# Patient Record
Sex: Female | Born: 1935 | Race: Black or African American
Health system: Southern US, Community
[De-identification: ages and names within clinical notes are randomized; demographics above are authoritative.]

## PROBLEM LIST (undated history)

## (undated) ENCOUNTER — Emergency Department

## (undated) DIAGNOSIS — T783XXA Angioneurotic edema, initial encounter: Secondary | ICD-10-CM

## (undated) DIAGNOSIS — I10 Essential (primary) hypertension: Secondary | ICD-10-CM

## (undated) DIAGNOSIS — E119 Type 2 diabetes mellitus without complications: Secondary | ICD-10-CM

## (undated) DIAGNOSIS — I4891 Unspecified atrial fibrillation: Secondary | ICD-10-CM

## (undated) DIAGNOSIS — K828 Other specified diseases of gallbladder: Secondary | ICD-10-CM

## (undated) DIAGNOSIS — L259 Unspecified contact dermatitis, unspecified cause: Secondary | ICD-10-CM

## (undated) DIAGNOSIS — R0609 Other forms of dyspnea: Secondary | ICD-10-CM

## (undated) DIAGNOSIS — R569 Unspecified convulsions: Secondary | ICD-10-CM

## (undated) DIAGNOSIS — K219 Gastro-esophageal reflux disease without esophagitis: Secondary | ICD-10-CM

## (undated) DIAGNOSIS — D759 Disease of blood and blood-forming organs, unspecified: Secondary | ICD-10-CM

## (undated) DIAGNOSIS — R300 Dysuria: Secondary | ICD-10-CM

## (undated) DIAGNOSIS — E669 Obesity, unspecified: Secondary | ICD-10-CM

## (undated) DIAGNOSIS — R932 Abnormal findings on diagnostic imaging of liver and biliary tract: Secondary | ICD-10-CM

## (undated) DIAGNOSIS — K648 Other hemorrhoids: Secondary | ICD-10-CM

## (undated) DIAGNOSIS — R609 Edema, unspecified: Secondary | ICD-10-CM

## (undated) DIAGNOSIS — Z8679 Personal history of other diseases of the circulatory system: Secondary | ICD-10-CM

## (undated) DIAGNOSIS — Z8719 Personal history of other diseases of the digestive system: Secondary | ICD-10-CM

## (undated) DIAGNOSIS — M25569 Pain in unspecified knee: Secondary | ICD-10-CM

## (undated) DIAGNOSIS — I639 Cerebral infarction, unspecified: Secondary | ICD-10-CM

## (undated) DIAGNOSIS — D509 Iron deficiency anemia, unspecified: Secondary | ICD-10-CM

## (undated) DIAGNOSIS — R0989 Other specified symptoms and signs involving the circulatory and respiratory systems: Secondary | ICD-10-CM

## (undated) DIAGNOSIS — G4710 Hypersomnia, unspecified: Secondary | ICD-10-CM

## (undated) HISTORY — PX: HYSTERECTOMY: SHX81

## (undated) HISTORY — PX: HERNIA REPAIR: SHX51

## (undated) HISTORY — DX: Gastro-esophageal reflux disease without esophagitis: K21.9

## (undated) HISTORY — DX: Other forms of dyspnea: R06.09

## (undated) HISTORY — DX: Abnormal findings on diagnostic imaging of liver and biliary tract: R93.2

## (undated) HISTORY — DX: Obesity, unspecified: E66.9

## (undated) HISTORY — DX: Edema, unspecified: R60.9

## (undated) HISTORY — DX: Other specified diseases of gallbladder: K82.8

## (undated) HISTORY — DX: Other hemorrhoids: K64.8

## (undated) HISTORY — DX: Other specified symptoms and signs involving the circulatory and respiratory systems: R09.89

## (undated) HISTORY — DX: Cerebral infarction, unspecified: I63.9

## (undated) HISTORY — DX: Iron deficiency anemia, unspecified: D50.9

## (undated) HISTORY — DX: Unspecified contact dermatitis, unspecified cause: L25.9

## (undated) HISTORY — DX: Angioneurotic edema, initial encounter: T78.3XXA

## (undated) HISTORY — DX: Disease of blood and blood-forming organs, unspecified: D75.9

## (undated) HISTORY — DX: Personal history of other diseases of the digestive system: Z87.19

## (undated) HISTORY — DX: Pain in unspecified knee: M25.569

## (undated) HISTORY — PX: SMALL INTESTINE SURGERY: SHX150

## (undated) HISTORY — DX: Personal history of other diseases of the circulatory system: Z86.79

## (undated) HISTORY — DX: Dysuria: R30.0

## (undated) HISTORY — DX: Hypersomnia, unspecified: G47.10

## (undated) HISTORY — PX: OTHER SURGICAL HISTORY: SHX169

## (undated) HISTORY — DX: Type 2 diabetes mellitus without complications: E11.9

## (undated) HISTORY — DX: Essential (primary) hypertension: I10

---
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
## 1997-05-02 ENCOUNTER — Encounter: Admission: RE | Admit: 1997-05-02 | Discharge: 1997-05-02 | Payer: Self-pay | Admitting: Internal Medicine

## 1997-05-19 ENCOUNTER — Encounter: Admission: RE | Admit: 1997-05-19 | Discharge: 1997-05-19 | Payer: Self-pay | Admitting: Internal Medicine

## 1997-06-08 ENCOUNTER — Encounter: Admission: RE | Admit: 1997-06-08 | Discharge: 1997-06-08 | Payer: Self-pay | Admitting: Internal Medicine

## 1997-06-20 ENCOUNTER — Encounter: Admission: RE | Admit: 1997-06-20 | Discharge: 1997-06-20 | Payer: Self-pay | Admitting: Internal Medicine

## 1997-07-11 ENCOUNTER — Encounter: Admission: RE | Admit: 1997-07-11 | Discharge: 1997-07-11 | Payer: Self-pay | Admitting: Internal Medicine

## 1997-08-01 ENCOUNTER — Encounter: Admission: RE | Admit: 1997-08-01 | Discharge: 1997-08-01 | Payer: Self-pay | Admitting: Internal Medicine

## 1997-08-23 ENCOUNTER — Encounter: Admission: RE | Admit: 1997-08-23 | Discharge: 1997-08-23 | Payer: Self-pay | Admitting: Internal Medicine

## 1997-08-31 ENCOUNTER — Encounter: Admission: RE | Admit: 1997-08-31 | Discharge: 1997-08-31 | Payer: Self-pay | Admitting: Internal Medicine

## 1997-09-07 ENCOUNTER — Encounter: Admission: RE | Admit: 1997-09-07 | Discharge: 1997-09-07 | Payer: Self-pay | Admitting: Internal Medicine

## 1997-09-21 ENCOUNTER — Encounter: Admission: RE | Admit: 1997-09-21 | Discharge: 1997-09-21 | Payer: Self-pay | Admitting: Internal Medicine

## 1997-10-13 ENCOUNTER — Encounter: Admission: RE | Admit: 1997-10-13 | Discharge: 1997-10-13 | Payer: Self-pay | Admitting: Hematology and Oncology

## 1997-10-19 ENCOUNTER — Encounter: Admission: RE | Admit: 1997-10-19 | Discharge: 1997-10-19 | Payer: Self-pay | Admitting: Internal Medicine

## 1997-11-04 ENCOUNTER — Ambulatory Visit (HOSPITAL_COMMUNITY): Admission: RE | Admit: 1997-11-04 | Discharge: 1997-11-04 | Payer: Self-pay | Admitting: Internal Medicine

## 1997-11-21 ENCOUNTER — Encounter: Admission: RE | Admit: 1997-11-21 | Discharge: 1997-11-21 | Payer: Self-pay | Admitting: Internal Medicine

## 1997-11-23 ENCOUNTER — Ambulatory Visit (HOSPITAL_COMMUNITY): Admission: RE | Admit: 1997-11-23 | Discharge: 1997-11-23 | Payer: Self-pay | Admitting: Internal Medicine

## 1997-12-21 ENCOUNTER — Encounter: Admission: RE | Admit: 1997-12-21 | Discharge: 1997-12-21 | Payer: Self-pay | Admitting: Internal Medicine

## 1998-01-27 ENCOUNTER — Encounter: Admission: RE | Admit: 1998-01-27 | Discharge: 1998-01-27 | Payer: Self-pay | Admitting: Internal Medicine

## 1998-02-27 ENCOUNTER — Encounter: Admission: RE | Admit: 1998-02-27 | Discharge: 1998-02-27 | Payer: Self-pay | Admitting: Internal Medicine

## 1998-03-08 ENCOUNTER — Encounter: Admission: RE | Admit: 1998-03-08 | Discharge: 1998-03-08 | Payer: Self-pay | Admitting: Internal Medicine

## 1998-03-28 ENCOUNTER — Encounter: Admission: RE | Admit: 1998-03-28 | Discharge: 1998-03-28 | Payer: Self-pay | Admitting: Internal Medicine

## 1998-04-18 ENCOUNTER — Encounter: Admission: RE | Admit: 1998-04-18 | Discharge: 1998-04-18 | Payer: Self-pay | Admitting: Internal Medicine

## 1998-05-19 ENCOUNTER — Encounter: Admission: RE | Admit: 1998-05-19 | Discharge: 1998-05-19 | Payer: Self-pay | Admitting: Internal Medicine

## 1998-06-19 ENCOUNTER — Encounter: Admission: RE | Admit: 1998-06-19 | Discharge: 1998-06-19 | Payer: Self-pay | Admitting: Internal Medicine

## 1998-07-19 ENCOUNTER — Encounter: Admission: RE | Admit: 1998-07-19 | Discharge: 1998-07-19 | Payer: Self-pay | Admitting: Internal Medicine

## 1998-08-16 ENCOUNTER — Encounter: Admission: RE | Admit: 1998-08-16 | Discharge: 1998-08-16 | Payer: Self-pay | Admitting: Internal Medicine

## 1998-09-05 ENCOUNTER — Ambulatory Visit (HOSPITAL_COMMUNITY): Admission: RE | Admit: 1998-09-05 | Discharge: 1998-09-05 | Payer: Self-pay | Admitting: Internal Medicine

## 1998-09-15 ENCOUNTER — Encounter: Admission: RE | Admit: 1998-09-15 | Discharge: 1998-09-15 | Payer: Self-pay | Admitting: Internal Medicine

## 1998-09-26 ENCOUNTER — Encounter: Admission: RE | Admit: 1998-09-26 | Discharge: 1998-09-26 | Payer: Self-pay | Admitting: Obstetrics & Gynecology

## 1998-10-10 ENCOUNTER — Encounter: Admission: RE | Admit: 1998-10-10 | Discharge: 1998-10-10 | Payer: Self-pay | Admitting: Internal Medicine

## 1998-10-12 ENCOUNTER — Emergency Department (HOSPITAL_COMMUNITY): Admission: EM | Admit: 1998-10-12 | Discharge: 1998-10-12 | Payer: Self-pay | Admitting: Emergency Medicine

## 1998-10-12 ENCOUNTER — Encounter: Payer: Self-pay | Admitting: Emergency Medicine

## 1998-11-09 ENCOUNTER — Encounter: Admission: RE | Admit: 1998-11-09 | Discharge: 1998-11-09 | Payer: Self-pay | Admitting: Internal Medicine

## 1998-12-08 ENCOUNTER — Encounter: Admission: RE | Admit: 1998-12-08 | Discharge: 1998-12-08 | Payer: Self-pay | Admitting: Internal Medicine

## 1999-01-02 ENCOUNTER — Encounter: Admission: RE | Admit: 1999-01-02 | Discharge: 1999-01-02 | Payer: Self-pay | Admitting: Internal Medicine

## 1999-02-05 ENCOUNTER — Encounter: Payer: Self-pay | Admitting: Cardiology

## 1999-02-05 ENCOUNTER — Ambulatory Visit (HOSPITAL_COMMUNITY): Admission: RE | Admit: 1999-02-05 | Discharge: 1999-02-05 | Payer: Self-pay | Admitting: Cardiology

## 1999-02-13 ENCOUNTER — Encounter: Admission: RE | Admit: 1999-02-13 | Discharge: 1999-02-13 | Payer: Self-pay | Admitting: Internal Medicine

## 1999-03-09 ENCOUNTER — Encounter: Admission: RE | Admit: 1999-03-09 | Discharge: 1999-03-09 | Payer: Self-pay | Admitting: Internal Medicine

## 1999-03-23 ENCOUNTER — Ambulatory Visit (HOSPITAL_COMMUNITY): Admission: RE | Admit: 1999-03-23 | Discharge: 1999-03-23 | Payer: Self-pay | Admitting: Cardiology

## 1999-03-28 ENCOUNTER — Encounter: Admission: RE | Admit: 1999-03-28 | Discharge: 1999-03-28 | Payer: Self-pay | Admitting: Internal Medicine

## 1999-03-30 ENCOUNTER — Encounter: Admission: RE | Admit: 1999-03-30 | Discharge: 1999-03-30 | Payer: Self-pay | Admitting: Internal Medicine

## 1999-04-02 ENCOUNTER — Encounter: Admission: RE | Admit: 1999-04-02 | Discharge: 1999-04-02 | Payer: Self-pay | Admitting: Internal Medicine

## 1999-04-04 ENCOUNTER — Encounter: Admission: RE | Admit: 1999-04-04 | Discharge: 1999-04-04 | Payer: Self-pay | Admitting: Internal Medicine

## 1999-04-10 ENCOUNTER — Encounter: Admission: RE | Admit: 1999-04-10 | Discharge: 1999-04-10 | Payer: Self-pay | Admitting: Internal Medicine

## 1999-04-20 ENCOUNTER — Encounter: Admission: RE | Admit: 1999-04-20 | Discharge: 1999-04-20 | Payer: Self-pay | Admitting: Internal Medicine

## 1999-05-08 ENCOUNTER — Encounter: Admission: RE | Admit: 1999-05-08 | Discharge: 1999-05-08 | Payer: Self-pay | Admitting: Internal Medicine

## 1999-05-09 ENCOUNTER — Encounter: Admission: RE | Admit: 1999-05-09 | Discharge: 1999-05-09 | Payer: Self-pay | Admitting: Internal Medicine

## 1999-05-26 ENCOUNTER — Inpatient Hospital Stay (HOSPITAL_COMMUNITY): Admission: AD | Admit: 1999-05-26 | Discharge: 1999-05-26 | Payer: Self-pay | Admitting: *Deleted

## 1999-09-12 ENCOUNTER — Emergency Department (HOSPITAL_COMMUNITY): Admission: EM | Admit: 1999-09-12 | Discharge: 1999-09-12 | Payer: Self-pay | Admitting: Anesthesiology

## 1999-09-25 ENCOUNTER — Emergency Department (HOSPITAL_COMMUNITY): Admission: EM | Admit: 1999-09-25 | Discharge: 1999-09-25 | Payer: Self-pay | Admitting: Emergency Medicine

## 1999-09-27 ENCOUNTER — Encounter: Payer: Self-pay | Admitting: Internal Medicine

## 1999-10-03 ENCOUNTER — Ambulatory Visit (HOSPITAL_COMMUNITY): Admission: RE | Admit: 1999-10-03 | Discharge: 1999-10-03 | Payer: Self-pay | Admitting: Gastroenterology

## 1999-10-03 ENCOUNTER — Encounter (INDEPENDENT_AMBULATORY_CARE_PROVIDER_SITE_OTHER): Payer: Self-pay | Admitting: *Deleted

## 1999-10-15 ENCOUNTER — Encounter: Payer: Self-pay | Admitting: Internal Medicine

## 1999-10-31 ENCOUNTER — Encounter: Admission: RE | Admit: 1999-10-31 | Discharge: 2000-01-29 | Payer: Self-pay | Admitting: Cardiology

## 2000-05-21 ENCOUNTER — Ambulatory Visit (HOSPITAL_COMMUNITY): Admission: RE | Admit: 2000-05-21 | Discharge: 2000-05-21 | Payer: Self-pay | Admitting: Cardiology

## 2000-05-21 ENCOUNTER — Encounter: Payer: Self-pay | Admitting: Cardiology

## 2000-12-01 ENCOUNTER — Encounter: Payer: Self-pay | Admitting: Emergency Medicine

## 2000-12-01 ENCOUNTER — Emergency Department (HOSPITAL_COMMUNITY): Admission: EM | Admit: 2000-12-01 | Discharge: 2000-12-01 | Payer: Self-pay | Admitting: Emergency Medicine

## 2001-04-01 ENCOUNTER — Inpatient Hospital Stay (HOSPITAL_COMMUNITY): Admission: EM | Admit: 2001-04-01 | Discharge: 2001-04-02 | Payer: Self-pay

## 2001-04-01 ENCOUNTER — Encounter: Payer: Self-pay | Admitting: Emergency Medicine

## 2001-04-02 ENCOUNTER — Encounter: Payer: Self-pay | Admitting: Cardiology

## 2001-04-22 ENCOUNTER — Encounter: Payer: Self-pay | Admitting: Cardiology

## 2001-04-22 ENCOUNTER — Emergency Department (HOSPITAL_COMMUNITY): Admission: EM | Admit: 2001-04-22 | Discharge: 2001-04-22 | Payer: Self-pay | Admitting: Emergency Medicine

## 2001-04-22 ENCOUNTER — Ambulatory Visit (HOSPITAL_COMMUNITY): Admission: RE | Admit: 2001-04-22 | Discharge: 2001-04-22 | Payer: Self-pay | Admitting: Cardiology

## 2001-07-29 ENCOUNTER — Encounter: Payer: Self-pay | Admitting: Cardiology

## 2001-07-29 ENCOUNTER — Ambulatory Visit (HOSPITAL_COMMUNITY): Admission: RE | Admit: 2001-07-29 | Discharge: 2001-07-29 | Payer: Self-pay | Admitting: Cardiology

## 2001-09-28 ENCOUNTER — Emergency Department (HOSPITAL_COMMUNITY): Admission: EM | Admit: 2001-09-28 | Discharge: 2001-09-28 | Payer: Self-pay | Admitting: Emergency Medicine

## 2001-09-28 ENCOUNTER — Encounter: Payer: Self-pay | Admitting: Emergency Medicine

## 2002-03-01 ENCOUNTER — Emergency Department (HOSPITAL_COMMUNITY): Admission: EM | Admit: 2002-03-01 | Discharge: 2002-03-01 | Payer: Self-pay | Admitting: Emergency Medicine

## 2002-03-03 ENCOUNTER — Encounter: Payer: Self-pay | Admitting: Emergency Medicine

## 2002-03-03 ENCOUNTER — Emergency Department (HOSPITAL_COMMUNITY): Admission: EM | Admit: 2002-03-03 | Discharge: 2002-03-04 | Payer: Self-pay | Admitting: Emergency Medicine

## 2002-03-07 ENCOUNTER — Emergency Department (HOSPITAL_COMMUNITY): Admission: EM | Admit: 2002-03-07 | Discharge: 2002-03-07 | Payer: Self-pay | Admitting: *Deleted

## 2002-07-06 ENCOUNTER — Encounter: Admission: RE | Admit: 2002-07-06 | Discharge: 2002-08-11 | Payer: Self-pay | Admitting: Physician Assistant

## 2003-01-04 ENCOUNTER — Emergency Department (HOSPITAL_COMMUNITY): Admission: EM | Admit: 2003-01-04 | Discharge: 2003-01-04 | Payer: Self-pay | Admitting: Emergency Medicine

## 2003-01-07 ENCOUNTER — Other Ambulatory Visit: Admission: RE | Admit: 2003-01-07 | Discharge: 2003-01-07 | Payer: Self-pay | Admitting: Obstetrics & Gynecology

## 2003-02-16 ENCOUNTER — Encounter: Admission: RE | Admit: 2003-02-16 | Discharge: 2003-02-16 | Payer: Self-pay | Admitting: Cardiology

## 2003-03-09 ENCOUNTER — Emergency Department (HOSPITAL_COMMUNITY): Admission: EM | Admit: 2003-03-09 | Discharge: 2003-03-10 | Payer: Self-pay | Admitting: Emergency Medicine

## 2003-03-30 ENCOUNTER — Emergency Department (HOSPITAL_COMMUNITY): Admission: EM | Admit: 2003-03-30 | Discharge: 2003-03-30 | Payer: Self-pay | Admitting: Emergency Medicine

## 2003-04-13 ENCOUNTER — Encounter: Admission: RE | Admit: 2003-04-13 | Discharge: 2003-04-13 | Payer: Self-pay | Admitting: Specialist

## 2004-02-15 ENCOUNTER — Encounter: Admission: RE | Admit: 2004-02-15 | Discharge: 2004-02-15 | Payer: Self-pay | Admitting: Gastroenterology

## 2004-02-16 ENCOUNTER — Encounter: Payer: Self-pay | Admitting: Internal Medicine

## 2004-02-23 ENCOUNTER — Encounter: Payer: Self-pay | Admitting: Internal Medicine

## 2004-04-02 ENCOUNTER — Encounter (INDEPENDENT_AMBULATORY_CARE_PROVIDER_SITE_OTHER): Payer: Self-pay | Admitting: *Deleted

## 2004-04-02 ENCOUNTER — Ambulatory Visit (HOSPITAL_COMMUNITY): Admission: RE | Admit: 2004-04-02 | Discharge: 2004-04-02 | Payer: Self-pay | Admitting: Gastroenterology

## 2004-04-14 ENCOUNTER — Emergency Department (HOSPITAL_COMMUNITY): Admission: EM | Admit: 2004-04-14 | Discharge: 2004-04-14 | Payer: Self-pay | Admitting: Emergency Medicine

## 2004-04-17 ENCOUNTER — Encounter: Payer: Self-pay | Admitting: Internal Medicine

## 2005-01-21 LAB — HM COLONOSCOPY: HM Colonoscopy: NORMAL

## 2006-06-05 ENCOUNTER — Encounter: Admission: RE | Admit: 2006-06-05 | Discharge: 2006-06-05 | Payer: Self-pay | Admitting: Internal Medicine

## 2006-06-08 ENCOUNTER — Emergency Department (HOSPITAL_COMMUNITY): Admission: EM | Admit: 2006-06-08 | Discharge: 2006-06-09 | Payer: Self-pay | Admitting: Emergency Medicine

## 2006-06-18 ENCOUNTER — Ambulatory Visit (HOSPITAL_COMMUNITY): Admission: RE | Admit: 2006-06-18 | Discharge: 2006-06-18 | Payer: Self-pay | Admitting: Cardiology

## 2007-03-10 ENCOUNTER — Emergency Department (HOSPITAL_COMMUNITY): Admission: EM | Admit: 2007-03-10 | Discharge: 2007-03-10 | Payer: Self-pay | Admitting: Emergency Medicine

## 2007-06-10 ENCOUNTER — Ambulatory Visit (HOSPITAL_COMMUNITY): Admission: RE | Admit: 2007-06-10 | Discharge: 2007-06-10 | Payer: Self-pay | Admitting: Cardiology

## 2007-07-21 ENCOUNTER — Emergency Department (HOSPITAL_COMMUNITY): Admission: EM | Admit: 2007-07-21 | Discharge: 2007-07-21 | Payer: Self-pay | Admitting: Emergency Medicine

## 2007-07-25 ENCOUNTER — Encounter (INDEPENDENT_AMBULATORY_CARE_PROVIDER_SITE_OTHER): Payer: Self-pay | Admitting: *Deleted

## 2007-07-25 ENCOUNTER — Inpatient Hospital Stay (HOSPITAL_COMMUNITY): Admission: EM | Admit: 2007-07-25 | Discharge: 2007-07-26 | Payer: Self-pay | Admitting: Emergency Medicine

## 2007-07-26 ENCOUNTER — Encounter (INDEPENDENT_AMBULATORY_CARE_PROVIDER_SITE_OTHER): Payer: Self-pay | Admitting: *Deleted

## 2007-08-19 ENCOUNTER — Encounter (INDEPENDENT_AMBULATORY_CARE_PROVIDER_SITE_OTHER): Payer: Self-pay | Admitting: *Deleted

## 2007-09-04 ENCOUNTER — Encounter: Payer: Self-pay | Admitting: Endocrinology

## 2008-02-13 ENCOUNTER — Emergency Department (HOSPITAL_COMMUNITY): Admission: EM | Admit: 2008-02-13 | Discharge: 2008-02-13 | Payer: Self-pay | Admitting: Emergency Medicine

## 2008-03-01 DIAGNOSIS — K219 Gastro-esophageal reflux disease without esophagitis: Secondary | ICD-10-CM

## 2008-03-01 DIAGNOSIS — T783XXA Angioneurotic edema, initial encounter: Secondary | ICD-10-CM

## 2008-03-01 DIAGNOSIS — K648 Other hemorrhoids: Secondary | ICD-10-CM | POA: Insufficient documentation

## 2008-03-01 DIAGNOSIS — I10 Essential (primary) hypertension: Secondary | ICD-10-CM

## 2008-03-01 DIAGNOSIS — E119 Type 2 diabetes mellitus without complications: Secondary | ICD-10-CM

## 2008-03-01 DIAGNOSIS — Z8679 Personal history of other diseases of the circulatory system: Secondary | ICD-10-CM | POA: Insufficient documentation

## 2008-03-01 DIAGNOSIS — Z8719 Personal history of other diseases of the digestive system: Secondary | ICD-10-CM

## 2008-03-01 DIAGNOSIS — E1165 Type 2 diabetes mellitus with hyperglycemia: Secondary | ICD-10-CM | POA: Insufficient documentation

## 2008-03-01 DIAGNOSIS — IMO0002 Reserved for concepts with insufficient information to code with codable children: Secondary | ICD-10-CM | POA: Insufficient documentation

## 2008-03-01 HISTORY — DX: Type 2 diabetes mellitus without complications: E11.9

## 2008-03-01 HISTORY — DX: Personal history of other diseases of the digestive system: Z87.19

## 2008-03-01 HISTORY — DX: Personal history of other diseases of the circulatory system: Z86.79

## 2008-03-01 HISTORY — DX: Gastro-esophageal reflux disease without esophagitis: K21.9

## 2008-03-01 HISTORY — DX: Essential (primary) hypertension: I10

## 2008-03-01 HISTORY — DX: Angioneurotic edema, initial encounter: T78.3XXA

## 2008-03-01 HISTORY — DX: Other hemorrhoids: K64.8

## 2008-03-03 ENCOUNTER — Encounter: Payer: Self-pay | Admitting: Endocrinology

## 2008-03-07 ENCOUNTER — Ambulatory Visit: Payer: Self-pay | Admitting: Internal Medicine

## 2008-03-07 DIAGNOSIS — K828 Other specified diseases of gallbladder: Secondary | ICD-10-CM

## 2008-03-07 DIAGNOSIS — R932 Abnormal findings on diagnostic imaging of liver and biliary tract: Secondary | ICD-10-CM

## 2008-03-07 HISTORY — DX: Abnormal findings on diagnostic imaging of liver and biliary tract: R93.2

## 2008-03-07 HISTORY — DX: Other specified diseases of gallbladder: K82.8

## 2008-03-07 LAB — CONVERTED CEMR LAB
Albumin: 3.8 g/dL (ref 3.5–5.2)
Alkaline Phosphatase: 85 units/L (ref 39–117)
BUN: 18 mg/dL (ref 6–23)
Basophils Relative: 1.1 % (ref 0.0–3.0)
Eosinophils Relative: 2 % (ref 0.0–5.0)
GFR calc non Af Amer: 58 mL/min
Glucose, Bld: 290 mg/dL — ABNORMAL HIGH (ref 70–99)
HCT: 37.8 % (ref 36.0–46.0)
Hemoglobin: 12 g/dL (ref 12.0–15.0)
Monocytes Absolute: 0.7 10*3/uL (ref 0.1–1.0)
Monocytes Relative: 8.9 % (ref 3.0–12.0)
Neutro Abs: 3.4 10*3/uL (ref 1.4–7.7)
Potassium: 3.9 meq/L (ref 3.5–5.1)
RBC: 4.6 M/uL (ref 3.87–5.11)
TSH: 1.49 microintl units/mL (ref 0.35–5.50)
WBC: 8 10*3/uL (ref 4.5–10.5)

## 2008-03-08 ENCOUNTER — Telehealth: Payer: Self-pay | Admitting: Internal Medicine

## 2008-03-09 ENCOUNTER — Ambulatory Visit (HOSPITAL_COMMUNITY): Admission: RE | Admit: 2008-03-09 | Discharge: 2008-03-09 | Payer: Self-pay | Admitting: Internal Medicine

## 2008-03-15 ENCOUNTER — Telehealth: Payer: Self-pay | Admitting: Internal Medicine

## 2008-03-16 ENCOUNTER — Emergency Department (HOSPITAL_COMMUNITY): Admission: EM | Admit: 2008-03-16 | Discharge: 2008-03-16 | Payer: Self-pay | Admitting: Emergency Medicine

## 2008-03-22 ENCOUNTER — Ambulatory Visit: Payer: Self-pay | Admitting: Endocrinology

## 2008-04-05 ENCOUNTER — Ambulatory Visit: Payer: Self-pay | Admitting: Endocrinology

## 2008-04-08 ENCOUNTER — Telehealth: Payer: Self-pay | Admitting: Family Medicine

## 2008-04-21 ENCOUNTER — Telehealth (INDEPENDENT_AMBULATORY_CARE_PROVIDER_SITE_OTHER): Payer: Self-pay | Admitting: *Deleted

## 2008-05-17 ENCOUNTER — Ambulatory Visit: Payer: Self-pay | Admitting: Endocrinology

## 2008-05-19 ENCOUNTER — Telehealth: Payer: Self-pay | Admitting: Endocrinology

## 2008-07-14 ENCOUNTER — Ambulatory Visit: Payer: Self-pay | Admitting: Internal Medicine

## 2008-07-15 ENCOUNTER — Ambulatory Visit: Payer: Self-pay | Admitting: Internal Medicine

## 2008-07-18 ENCOUNTER — Encounter (INDEPENDENT_AMBULATORY_CARE_PROVIDER_SITE_OTHER): Payer: Self-pay | Admitting: *Deleted

## 2008-07-18 LAB — CONVERTED CEMR LAB
CO2: 28 meq/L (ref 19–32)
Calcium: 9 mg/dL (ref 8.4–10.5)
Glucose, Bld: 69 mg/dL — ABNORMAL LOW (ref 70–99)
HDL: 52.3 mg/dL (ref >39.00)
Potassium: 4.4 meq/L (ref 3.5–5.1)
Sodium: 142 meq/L (ref 135–145)

## 2008-07-19 ENCOUNTER — Ambulatory Visit: Payer: Self-pay | Admitting: Endocrinology

## 2008-07-27 ENCOUNTER — Telehealth (INDEPENDENT_AMBULATORY_CARE_PROVIDER_SITE_OTHER): Payer: Self-pay | Admitting: *Deleted

## 2008-07-28 ENCOUNTER — Telehealth: Payer: Self-pay | Admitting: Endocrinology

## 2008-07-29 ENCOUNTER — Ambulatory Visit: Payer: Self-pay | Admitting: Endocrinology

## 2008-07-29 ENCOUNTER — Telehealth: Payer: Self-pay | Admitting: Internal Medicine

## 2008-08-04 ENCOUNTER — Telehealth: Payer: Self-pay | Admitting: Internal Medicine

## 2008-08-10 ENCOUNTER — Ambulatory Visit: Payer: Self-pay | Admitting: Internal Medicine

## 2008-08-10 DIAGNOSIS — R609 Edema, unspecified: Secondary | ICD-10-CM

## 2008-08-10 HISTORY — DX: Edema, unspecified: R60.9

## 2008-08-11 ENCOUNTER — Encounter: Payer: Self-pay | Admitting: Internal Medicine

## 2008-08-11 DIAGNOSIS — D509 Iron deficiency anemia, unspecified: Secondary | ICD-10-CM

## 2008-08-11 HISTORY — DX: Iron deficiency anemia, unspecified: D50.9

## 2008-08-11 LAB — CONVERTED CEMR LAB
BUN: 20 mg/dL (ref 6–23)
CO2: 27 meq/L (ref 19–32)
Chloride: 107 meq/L (ref 96–112)
Glucose, Bld: 128 mg/dL — ABNORMAL HIGH (ref 70–99)
HCT: 28.3 % — ABNORMAL LOW (ref 36.0–46.0)
Hemoglobin: 9.2 g/dL — ABNORMAL LOW (ref 12.0–15.0)
MCHC: 32.6 g/dL (ref 30.0–36.0)
MCV: 81.4 fL (ref 78.0–100.0)
Potassium: 4.1 meq/L (ref 3.5–5.1)
RDW: 18 % — ABNORMAL HIGH (ref 11.5–14.6)

## 2008-08-12 ENCOUNTER — Encounter (INDEPENDENT_AMBULATORY_CARE_PROVIDER_SITE_OTHER): Payer: Self-pay | Admitting: *Deleted

## 2008-08-12 LAB — CONVERTED CEMR LAB
Iron: 24 ug/dL — ABNORMAL LOW (ref 42–145)
Transferrin: 299.4 mg/dL (ref 212.0–360.0)

## 2008-08-18 ENCOUNTER — Ambulatory Visit: Payer: Self-pay | Admitting: Internal Medicine

## 2008-08-18 ENCOUNTER — Telehealth: Payer: Self-pay | Admitting: Internal Medicine

## 2008-08-18 DIAGNOSIS — D759 Disease of blood and blood-forming organs, unspecified: Secondary | ICD-10-CM | POA: Insufficient documentation

## 2008-08-18 HISTORY — DX: Disease of blood and blood-forming organs, unspecified: D75.9

## 2008-08-18 LAB — CONVERTED CEMR LAB
GFR calc non Af Amer: 62.48 mL/min (ref >60)
Potassium: 4.2 meq/L (ref 3.5–5.1)
Sodium: 143 meq/L (ref 135–145)

## 2008-08-23 ENCOUNTER — Telehealth: Payer: Self-pay | Admitting: Internal Medicine

## 2008-09-05 ENCOUNTER — Telehealth: Payer: Self-pay | Admitting: Internal Medicine

## 2008-09-07 ENCOUNTER — Ambulatory Visit: Payer: Self-pay | Admitting: Internal Medicine

## 2008-09-07 LAB — CONVERTED CEMR LAB
HCT: 29.8 % — ABNORMAL LOW (ref 36.0–46.0)
MCHC: 32.9 g/dL (ref 30.0–36.0)
MCV: 78.8 fL (ref 78.0–100.0)
RBC: 3.78 M/uL — ABNORMAL LOW (ref 3.87–5.11)

## 2008-09-14 ENCOUNTER — Encounter (INDEPENDENT_AMBULATORY_CARE_PROVIDER_SITE_OTHER): Payer: Self-pay | Admitting: *Deleted

## 2008-09-14 ENCOUNTER — Ambulatory Visit: Payer: Self-pay | Admitting: Internal Medicine

## 2008-09-14 ENCOUNTER — Inpatient Hospital Stay (HOSPITAL_COMMUNITY): Admission: EM | Admit: 2008-09-14 | Discharge: 2008-09-19 | Payer: Self-pay | Admitting: Emergency Medicine

## 2008-09-14 HISTORY — PX: HERNIA REPAIR: SHX51

## 2008-09-22 ENCOUNTER — Ambulatory Visit: Payer: Self-pay | Admitting: Internal Medicine

## 2008-09-22 ENCOUNTER — Telehealth: Payer: Self-pay | Admitting: Internal Medicine

## 2008-09-22 LAB — CONVERTED CEMR LAB
Eosinophils Relative: 4.5 % (ref 0.0–5.0)
HCT: 25 % — ABNORMAL LOW (ref 36.0–46.0)
Hemoglobin: 8.2 g/dL — ABNORMAL LOW (ref 12.0–15.0)
Lymphocytes Relative: 69.1 % — ABNORMAL HIGH (ref 12.0–46.0)
Monocytes Relative: 7.7 % (ref 3.0–12.0)
Platelets: 391 10*3/uL (ref 150.0–400.0)
WBC: 8.4 10*3/uL (ref 4.5–10.5)

## 2008-09-23 ENCOUNTER — Ambulatory Visit: Payer: Self-pay | Admitting: Internal Medicine

## 2008-09-25 ENCOUNTER — Emergency Department (HOSPITAL_COMMUNITY): Admission: EM | Admit: 2008-09-25 | Discharge: 2008-09-25 | Payer: Self-pay | Admitting: Emergency Medicine

## 2008-09-27 ENCOUNTER — Encounter: Payer: Self-pay | Admitting: Internal Medicine

## 2008-09-30 ENCOUNTER — Ambulatory Visit (HOSPITAL_COMMUNITY): Admission: RE | Admit: 2008-09-30 | Discharge: 2008-09-30 | Payer: Self-pay | Admitting: Internal Medicine

## 2008-09-30 ENCOUNTER — Encounter: Payer: Self-pay | Admitting: Internal Medicine

## 2008-10-03 ENCOUNTER — Ambulatory Visit: Payer: Self-pay | Admitting: Internal Medicine

## 2008-10-07 ENCOUNTER — Telehealth: Payer: Self-pay | Admitting: Internal Medicine

## 2008-10-13 ENCOUNTER — Ambulatory Visit: Payer: Self-pay | Admitting: Internal Medicine

## 2008-10-13 LAB — CONVERTED CEMR LAB
Basophils Relative: 0 % (ref 0.0–3.0)
Eosinophils Absolute: 0.3 10*3/uL (ref 0.0–0.7)
Eosinophils Relative: 4.1 % (ref 0.0–5.0)
HCT: 31.5 % — ABNORMAL LOW (ref 36.0–46.0)
Hemoglobin: 10.2 g/dL — ABNORMAL LOW (ref 12.0–15.0)
Iron: 40 ug/dL — ABNORMAL LOW (ref 42–145)
Lymphocytes Relative: 32 % (ref 12.0–46.0)
MCV: 81 fL (ref 78.0–100.0)
Monocytes Relative: 10.1 % (ref 3.0–12.0)
Neutro Abs: 3.4 10*3/uL (ref 1.4–7.7)
Platelets: 411 10*3/uL — ABNORMAL HIGH (ref 150.0–400.0)
RDW: 21.8 % — ABNORMAL HIGH (ref 11.5–14.6)
Saturation Ratios: 12.9 % — ABNORMAL LOW (ref 20.0–50.0)
Transferrin: 220.7 mg/dL (ref 212.0–360.0)
WBC: 6.5 10*3/uL (ref 4.5–10.5)

## 2008-10-20 ENCOUNTER — Ambulatory Visit: Payer: Self-pay | Admitting: Endocrinology

## 2008-10-25 ENCOUNTER — Encounter: Payer: Self-pay | Admitting: Internal Medicine

## 2008-11-28 ENCOUNTER — Ambulatory Visit: Payer: Self-pay | Admitting: Internal Medicine

## 2008-11-28 DIAGNOSIS — L259 Unspecified contact dermatitis, unspecified cause: Secondary | ICD-10-CM

## 2008-11-28 HISTORY — DX: Unspecified contact dermatitis, unspecified cause: L25.9

## 2009-01-16 ENCOUNTER — Telehealth: Payer: Self-pay | Admitting: Internal Medicine

## 2009-01-23 ENCOUNTER — Telehealth: Payer: Self-pay | Admitting: Internal Medicine

## 2009-02-10 ENCOUNTER — Ambulatory Visit: Payer: Self-pay | Admitting: Internal Medicine

## 2009-02-13 LAB — CONVERTED CEMR LAB
Basophils Absolute: 0 10*3/uL (ref 0.0–0.1)
CO2: 23 meq/L (ref 19–32)
Calcium: 9.2 mg/dL (ref 8.4–10.5)
Creatinine, Ser: 1.1 mg/dL (ref 0.4–1.2)
Eosinophils Absolute: 0.3 10*3/uL (ref 0.0–0.7)
GFR calc non Af Amer: 62.4 mL/min (ref >60)
Glucose, Bld: 334 mg/dL — ABNORMAL HIGH (ref 70–99)
Hemoglobin: 13.1 g/dL (ref 12.0–15.0)
Hgb A1c MFr Bld: 12.3 % — ABNORMAL HIGH (ref 4.6–6.5)
Lymphocytes Relative: 42.2 % (ref 12.0–46.0)
Lymphs Abs: 2.5 10*3/uL (ref 0.7–4.0)
MCHC: 32.2 g/dL (ref 30.0–36.0)
Microalb, Ur: 7.5 mg/dL — ABNORMAL HIGH (ref 0.0–1.9)
Monocytes Relative: 3.1 % (ref 3.0–12.0)
Neutro Abs: 2.9 10*3/uL (ref 1.4–7.7)
Platelets: 309 10*3/uL (ref 150.0–400.0)
RDW: 16.1 % — ABNORMAL HIGH (ref 11.5–14.6)
Sodium: 140 meq/L (ref 135–145)

## 2009-02-14 ENCOUNTER — Ambulatory Visit: Payer: Self-pay | Admitting: Endocrinology

## 2009-03-07 ENCOUNTER — Ambulatory Visit: Payer: Self-pay | Admitting: Endocrinology

## 2009-04-06 ENCOUNTER — Ambulatory Visit: Payer: Self-pay | Admitting: Endocrinology

## 2009-04-06 ENCOUNTER — Encounter: Payer: Self-pay | Admitting: Internal Medicine

## 2009-05-11 ENCOUNTER — Ambulatory Visit: Payer: Self-pay | Admitting: Internal Medicine

## 2009-05-11 DIAGNOSIS — R0609 Other forms of dyspnea: Secondary | ICD-10-CM

## 2009-05-11 DIAGNOSIS — J069 Acute upper respiratory infection, unspecified: Secondary | ICD-10-CM | POA: Insufficient documentation

## 2009-05-11 DIAGNOSIS — R0989 Other specified symptoms and signs involving the circulatory and respiratory systems: Secondary | ICD-10-CM

## 2009-05-11 HISTORY — DX: Other forms of dyspnea: R06.09

## 2009-05-11 HISTORY — DX: Other specified symptoms and signs involving the circulatory and respiratory systems: R09.89

## 2009-05-15 LAB — CONVERTED CEMR LAB
BUN: 13 mg/dL (ref 6–23)
Basophils Relative: 1 % (ref 0.0–3.0)
Calcium: 9.2 mg/dL (ref 8.4–10.5)
Creatinine, Ser: 1 mg/dL (ref 0.4–1.2)
Eosinophils Relative: 2.3 % (ref 0.0–5.0)
GFR calc non Af Amer: 69.61 mL/min (ref >60)
Glucose, Bld: 97 mg/dL (ref 70–99)
HDL: 50.8 mg/dL (ref >39.00)
Lymphocytes Relative: 27.2 % (ref 12.0–46.0)
Monocytes Relative: 20 % — ABNORMAL HIGH (ref 3.0–12.0)
Platelets: 301 10*3/uL (ref 150.0–400.0)
RDW: 17.2 % — ABNORMAL HIGH (ref 11.5–14.6)
Total CHOL/HDL Ratio: 3
WBC: 6.2 10*3/uL (ref 4.5–10.5)

## 2009-05-30 ENCOUNTER — Ambulatory Visit: Payer: Self-pay | Admitting: Pulmonary Disease

## 2009-05-30 DIAGNOSIS — G4710 Hypersomnia, unspecified: Secondary | ICD-10-CM | POA: Insufficient documentation

## 2009-05-30 HISTORY — DX: Hypersomnia, unspecified: G47.10

## 2009-06-08 ENCOUNTER — Ambulatory Visit: Payer: Self-pay | Admitting: Endocrinology

## 2009-08-08 ENCOUNTER — Ambulatory Visit (HOSPITAL_BASED_OUTPATIENT_CLINIC_OR_DEPARTMENT_OTHER): Admission: RE | Admit: 2009-08-08 | Discharge: 2009-08-08 | Payer: Self-pay | Admitting: Pulmonary Disease

## 2009-08-08 ENCOUNTER — Encounter: Payer: Self-pay | Admitting: Pulmonary Disease

## 2009-08-22 ENCOUNTER — Ambulatory Visit: Payer: Self-pay | Admitting: Pulmonary Disease

## 2009-08-24 ENCOUNTER — Telehealth (INDEPENDENT_AMBULATORY_CARE_PROVIDER_SITE_OTHER): Payer: Self-pay | Admitting: *Deleted

## 2009-08-31 ENCOUNTER — Encounter: Payer: Self-pay | Admitting: Pulmonary Disease

## 2009-09-04 ENCOUNTER — Telehealth: Payer: Self-pay | Admitting: Pulmonary Disease

## 2009-09-05 ENCOUNTER — Ambulatory Visit: Payer: Self-pay | Admitting: Internal Medicine

## 2009-09-05 DIAGNOSIS — M25569 Pain in unspecified knee: Secondary | ICD-10-CM

## 2009-09-05 HISTORY — DX: Pain in unspecified knee: M25.569

## 2009-09-11 ENCOUNTER — Ambulatory Visit: Payer: Self-pay | Admitting: Endocrinology

## 2009-09-11 LAB — CONVERTED CEMR LAB
Basophils Absolute: 0 10*3/uL (ref 0.0–0.1)
Cholesterol: 167 mg/dL (ref 0–200)
Eosinophils Relative: 4.5 % (ref 0.0–5.0)
HCT: 38.4 % (ref 36.0–46.0)
Hemoglobin: 12.7 g/dL (ref 12.0–15.0)
Hgb A1c MFr Bld: 6.6 % — ABNORMAL HIGH (ref 4.6–6.5)
Lymphocytes Relative: 33.6 % (ref 12.0–46.0)
Monocytes Relative: 9.5 % (ref 3.0–12.0)
Neutro Abs: 3.8 10*3/uL (ref 1.4–7.7)
RBC: 4.39 M/uL (ref 3.87–5.11)
RDW: 15.8 % — ABNORMAL HIGH (ref 11.5–14.6)
Saturation Ratios: 27.8 % (ref 20.0–50.0)
Triglycerides: 68 mg/dL (ref 0.0–149.0)
VLDL: 13.6 mg/dL (ref 0.0–40.0)
WBC: 7.3 10*3/uL (ref 4.5–10.5)

## 2009-09-20 ENCOUNTER — Ambulatory Visit: Payer: Self-pay | Admitting: Pulmonary Disease

## 2009-12-01 ENCOUNTER — Telehealth: Payer: Self-pay | Admitting: Internal Medicine

## 2009-12-06 ENCOUNTER — Telehealth: Payer: Self-pay | Admitting: Endocrinology

## 2009-12-08 ENCOUNTER — Telehealth: Payer: Self-pay | Admitting: Endocrinology

## 2010-01-04 ENCOUNTER — Ambulatory Visit: Payer: Self-pay | Admitting: Internal Medicine

## 2010-01-04 ENCOUNTER — Ambulatory Visit: Payer: Self-pay | Admitting: Endocrinology

## 2010-01-04 DIAGNOSIS — R300 Dysuria: Secondary | ICD-10-CM

## 2010-01-04 HISTORY — DX: Dysuria: R30.0

## 2010-01-04 LAB — CONVERTED CEMR LAB
Basophils Relative: 0.9 % (ref 0.0–3.0)
Bilirubin Urine: NEGATIVE
CO2: 26 meq/L (ref 19–32)
Chloride: 101 meq/L (ref 96–112)
Creatinine, Ser: 0.9 mg/dL (ref 0.4–1.2)
Eosinophils Absolute: 0.3 10*3/uL (ref 0.0–0.7)
Hemoglobin: 12.4 g/dL (ref 12.0–15.0)
Hgb A1c MFr Bld: 7.2 % — ABNORMAL HIGH (ref 4.6–6.5)
Ketones, ur: NEGATIVE mg/dL
Lymphs Abs: 2.7 10*3/uL (ref 0.7–4.0)
MCHC: 33.2 g/dL (ref 30.0–36.0)
MCV: 87.3 fL (ref 78.0–100.0)
Monocytes Absolute: 0.6 10*3/uL (ref 0.1–1.0)
Neutro Abs: 2.9 10*3/uL (ref 1.4–7.7)
Neutrophils Relative %: 44.1 % (ref 43.0–77.0)
RBC: 4.27 M/uL (ref 3.87–5.11)
pH: 7.5 (ref 5.0–8.0)

## 2010-01-08 ENCOUNTER — Telehealth: Payer: Self-pay | Admitting: Endocrinology

## 2010-01-09 ENCOUNTER — Encounter: Payer: Self-pay | Admitting: Internal Medicine

## 2010-01-10 ENCOUNTER — Telehealth: Payer: Self-pay | Admitting: Internal Medicine

## 2010-01-10 DIAGNOSIS — E669 Obesity, unspecified: Secondary | ICD-10-CM

## 2010-01-10 HISTORY — DX: Obesity, unspecified: E66.9

## 2010-01-29 ENCOUNTER — Telehealth: Payer: Self-pay | Admitting: Internal Medicine

## 2010-01-29 ENCOUNTER — Encounter: Admit: 2010-01-29 | Payer: Self-pay | Admitting: Internal Medicine

## 2010-01-29 ENCOUNTER — Encounter: Payer: Self-pay | Admitting: Internal Medicine

## 2010-01-30 ENCOUNTER — Ambulatory Visit
Admission: RE | Admit: 2010-01-30 | Discharge: 2010-01-30 | Payer: Self-pay | Source: Home / Self Care | Attending: Internal Medicine | Admitting: Internal Medicine

## 2010-02-10 ENCOUNTER — Encounter: Payer: Self-pay | Admitting: Obstetrics and Gynecology

## 2010-02-10 ENCOUNTER — Encounter: Payer: Self-pay | Admitting: Cardiology

## 2010-02-11 ENCOUNTER — Encounter: Payer: Self-pay | Admitting: Internal Medicine

## 2010-02-11 ENCOUNTER — Encounter: Payer: Self-pay | Admitting: Specialist

## 2010-02-22 NOTE — Progress Notes (Signed)
Phone Note From Pharmacy   Caller: [facility] Summary of Call: [facility] is requesting a refill on pts. Januvia. They also stated patient is requesting to transfer all her prescriptions to [facility]. Initial call taken by: Robin Ewing CMA Duncan Dull),  December 01, 2009 2:39 PM    Prescriptions: BROMOCRIPTINE MESYLATE 2.5 MG TABS (BROMOCRIPTINE MESYLATE) 1 tab at bedtime  #30 x 5   Entered by:   Alysia Penna   Authorized by:   Newt Lukes MD   Signed by:   Alysia Penna on 12/01/2009   Method used:   Electronically to        [facility] (retail)       [address]       [place], [place]  [address]       Ph: [phone]       Fax: [phone]   RxID:   [id number] ACTOS 45 MG TABS (PIOGLITAZONE HCL) 1 once daily  #30 x 5   Entered by:   Alysia Penna   Authorized by:   Newt Lukes MD   Signed by:   Alysia Penna on 12/01/2009   Method used:   Electronically to        [facility] (retail)       [address]       [place], [place]  [address]       Ph: [phone]       Fax: [phone]   RxID:   [id number] METFORMIN HCL 500 MG XR24H-TAB (METFORMIN HCL) 2 pills two times a day  #120 x 5   Entered by:   Alysia Penna   Authorized by:   Newt Lukes MD   Signed by:   Alysia Penna on 12/01/2009   Method used:   Electronically to        [facility] (retail)       [address]       [place], [place]  [address]       Ph: [phone]       Fax: [phone]   RxID:   [id number] PRODIGY BLOOD GLUCOSE TEST  STRP (GLUCOSE BLOOD) CHECK BS two times a day Dx: 250.00  #100 Each x 1   Entered by:   Alysia Penna   Authorized by:   Newt Lukes MD   Signed by:   Alysia Penna on 12/01/2009   Method used:   Electronically to        [facility] (retail)       [address]       [place], [place]   [address]       Ph: [phone]       Fax: [phone]   RxID:   [id number] FUROSEMIDE 40 MG TABS (FUROSEMIDE) 1po once daily as needed  #30 x 5   Entered by:   Alysia Penna   Authorized by:   Newt Lukes MD   Signed by:   Alysia Penna on 12/01/2009   Method used:   Electronically to        [facility] (retail)       [address]       [place], [place]  [address]       Ph: [phone]       Fax: [phone]   RxID:   [id number] JANUVIA 100 MG TABS (SITAGLIPTIN PHOSPHATE) qd  #30 Each x 5   Entered by:  Alysia Penna   Authorized by:   Newt Lukes MD   Signed by:   Alysia Penna on 12/01/2009   Method used:   Electronically to        [facility] (retail)       [address]       [place], [place]  [address]       Ph: [phone]       Fax: [phone]   RxID:   [id number] CLONIDINE HCL 0.2 MG TABS (CLONIDINE HCL) 1 by mouth two times a day  #60 x 5   Entered by:   Alysia Penna   Authorized by:   Newt Lukes MD   Signed by:   Alysia Penna on 12/01/2009   Method used:   Electronically to        [facility] (retail)       [address]       [place], [place]  [address]       Ph: [phone]       Fax: [phone]   RxID:   [id number] NEXIUM 40 MG CPDR (ESOMEPRAZOLE MAGNESIUM) one tablet by mouth once daily  #30 Each x 5   Entered by:   Alysia Penna   Authorized by:   Newt Lukes MD   Signed by:   Alysia Penna on 12/01/2009   Method used:   Electronically to        [facility] (retail)       [address]       [place], [place]  [address]       Ph: [phone]       Fax: [phone]   RxID:   [id number]

## 2010-02-22 NOTE — Assessment & Plan Note (Signed)
Summary: 3 WK ROV /NWS  #   Vital Signs:  Patient profile:   77 year old female Height:      62 inches (157.48 cm) Weight:      235.50 pounds (107.05 kg) O2 Sat:      97 % on Room air Temp:     96.6 degrees F (35.89 degrees C) oral Pulse rate:   73 / minute BP sitting:   122 / 84  (left arm) Cuff size:   large  Vitals Entered By: Josph Macho RMA (March 07, 2009 8:43 AM)  O2 Flow:  Room air CC: 3 week follow up/ CF Is Patient Diabetic? Yes   Referring Provider:  Oliver Barre, MD  Primary Provider:  Newt Lukes MD  CC:  3 week follow up/ CF.  History of Present Illness: pt states cbg's have improved to the mid-100's.  pt states she feels well in general.  she brings a record of her cbg's which i have reviewed today.    Current Medications (verified): 1)  Nexium 40 Mg Cpdr (Esomeprazole Magnesium) .... One Tablet By Mouth Once Daily 2)  Plavix 75 Mg Tabs (Clopidogrel Bisulfate) .... One Tablet By Mouth Once Daily 3)  Aspirin 81 Mg  Tabs (Aspirin) .... One Tablet By Mouth Once Daily 4)  Benicar 20 Mg Tabs (Olmesartan Medoxomil) .... One Tablet By Mouth Once Daily 5)  Clonidine Hcl 0.2 Mg Tabs (Clonidine Hcl) .Marland Kitchen.. 1 By Mouth Two Times A Day 6)  Multivitamins   Tabs (Multiple Vitamin) .... One Tablet By Mouth Once Daily 7)  Cardizem Cd 300 Mg Xr24h-Cap (Diltiazem Hcl Coated Beads) .Marland Kitchen.. 1 By Mouth Once Daily 8)  Actos 30 Mg Tabs (Pioglitazone Hcl) .Marland Kitchen.. 1 By Mouth Once Daily 9)  Januvia 100 Mg Tabs (Sitagliptin Phosphate) .... Qd 10)  Glipizide Xl 5 Mg Xr24h-Tab (Glipizide) .Marland Kitchen.. 1 Qam 11)  Furosemide 40 Mg Tabs (Furosemide) .Marland Kitchen.. 1po Once Daily As Needed 12)  Vicodin 5-500 Mg Tabs (Hydrocodone-Acetaminophen) .... As Needed For Pain 13)  Pro-Biotic Blend  Caps (Probiotic Product) .... One Tablet By Mouth Once Daily 14)  Cranberry 405 Mg Caps (Cranberry) .... One Capsule By Mouth Once Daily 15)  Fe-Caps 250 Mg Cr-Caps (Ferrous Sulfate) .... One Tablet By Mouth Once  Daily 16)  Prodigy Blood Glucose Monitor W/device Kit (Blood Glucose Monitoring Suppl) .... Use Q Day 17)  Prodigy Blood Glucose Test  Strp (Glucose Blood) .... Check Bs Two Times A Day Dx: 250.00 18)  Iron-C 100-500-1 Mg/62ml Liqd (Ferrous Gluconate-C-Folic Acid) .Marland Kitchen.. 1 Tsp Qd 19)  Triamcinolone Acetonide 0.1 % Crea (Triamcinolone Acetonide) .... Apply To Affected Skin Two Times A Day As Needed 20)  Metformin Hcl 500 Mg Tabs (Metformin Hcl) .Marland Kitchen.. 1 By Mouth Two Times A Day  Allergies (verified): 1)  ! Prednisone 2)  ! Ace Inhibitors 3)  ! Penicillin 4)  ! Sulfa  Past History:  Past Medical History: Last updated: 08/18/2008 SMALL BOWEL OBSTRUCTION, HX OF (ICD-V12.79) CONSTIPATION (ICD-564.00) Family Hx of COLON CANCER (ICD-153.9) INTERNAL HEMORRHOIDS (ICD-455.0) CEREBROVASCULAR ACCIDENT, HX OF (ICD-V12.50) Hx of ANGIOEDEMA (ICD-995.1) GERD (ICD-530.81) HYPERTENSION (ICD-401.9) DIABETES MELLITUS (ICD-250.00)  Review of Systems  The patient denies hypoglycemia.    Physical Exam  General:  morbidly obese.   Extremities:  trace right pedal edema and trace left pedal edema.     Impression & Recommendations:  Problem # 1:  DIABETES MELLITUS (ICD-250.00) Assessment Improved  Medications Added to Medication List This Visit: 1)  Metformin  Hcl 500 Mg Xr24h-tab (Metformin hcl) .... 2 pills two times a day  Other Orders: Est. Patient Level III ([address])  Patient Instructions: 1)  check your blood sugar 1 times a day.  vary the time of day when you check, between before the 3 meals, and at bedtime.  also check if you have symptoms of your blood sugar being too high or too low.  please keep a record of the readings and bring it to your next appointment here.  please call us sooner if you are having low blood sugar episodes. 2)  continue your diet and exercise efforts. 3)  increase metformin to 2x500 mg two times a day 4)  Please schedule a follow-up appointment in 1  month. Prescriptions: METFORMIN HCL 500 MG XR24H-TAB (METFORMIN HCL) 2 pills two times a day  #120 x 11   Entered and Authorized by:   Minus Breeding MD   Signed by:   Minus Breeding MD on 03/07/2009   Method used:   Electronically to        [facility] (retail)       [address]       [place], [place]  [address]       Ph: [phone]       Fax: [phone]   RxID:   [id number]

## 2010-02-22 NOTE — Progress Notes (Signed)
Summary: results  Phone Note Call from Patient   Caller: Patient Call For: clance Summary of Call: calling to get sleep study results Initial call taken by: Rickard Patience,  September 04, 2009 9:10 AM  Follow-up for Phone Call        pt schedueld top see KC on 09-07-09 at 4pm. ok per megan. pt aware. Carron Curie CMA  September 04, 2009 9:33 AM

## 2010-02-22 NOTE — Assessment & Plan Note (Signed)
Summary: PER PT DEC FU-- D/T---STC   Vital Signs:  Patient profile:   77 year old female Height:      62 inches (157.48 cm) Weight:      231 pounds (105.00 kg) BMI:     42.40 O2 Sat:      95 % on Room air Temp:     98.2 degrees F (36.78 degrees C) oral Pulse rate:   73 / minute Pulse rhythm:   regular BP sitting:   130 / 80  (left arm) Cuff size:   large  Vitals Entered By: Brenton Grills CMA Duncan Dull) (January 04, 2010 9:42 AM)  O2 Flow:  Room air CC: Follow-up visit/refill Actos/flu shot today/aj Is Patient Diabetic? Yes Comments Pt is due for mammogram   Referring Provider:  Oliver Barre, MD  Primary Provider:  Newt Lukes MD  CC:  Follow-up visit/refill Actos/flu shot today/aj.  History of Present Illness: pt states she feels well in general, except for headache.  she says her diet is good.  physical activity is minimal.  no cbg record, but states cbg's are well-controlled.  Current Medications (verified): 1)  Nexium 40 Mg Cpdr (Esomeprazole Magnesium) .... One Tablet By Mouth Once Daily 2)  Plavix 75 Mg Tabs (Clopidogrel Bisulfate) .... One Tablet By Mouth Once Daily 3)  Aspirin 81 Mg  Tabs (Aspirin) .... One Tablet By Mouth Once Daily 4)  Benicar 20 Mg Tabs (Olmesartan Medoxomil) .... One Tablet By Mouth Once Daily 5)  Clonidine Hcl 0.2 Mg Tabs (Clonidine Hcl) .Marland Kitchen.. 1 By Mouth Two Times A Day 6)  Multivitamins   Tabs (Multiple Vitamin) .... One Tablet By Mouth Once Daily 7)  Cardizem Cd 300 Mg Xr24h-Cap (Diltiazem Hcl Coated Beads) .Marland Kitchen.. 1 By Mouth Once Daily 8)  Januvia 100 Mg Tabs (Sitagliptin Phosphate) .... Qd 9)  Furosemide 40 Mg Tabs (Furosemide) .Marland Kitchen.. 1po Once Daily As Needed 10)  Pro-Biotic Blend  Caps (Probiotic Product) .... One Tablet By Mouth Once Daily 11)  Cranberry 405 Mg Caps (Cranberry) .... One Capsule By Mouth Once Daily 12)  Fe-Caps 250 Mg Cr-Caps (Ferrous Sulfate) .... One Tablet By Mouth Once Daily 13)  Prodigy Blood Glucose Monitor W/device  Kit (Blood Glucose Monitoring Suppl) .... Use Q Day 14)  Prodigy Blood Glucose Test  Strp (Glucose Blood) .... Check Bs Two Times A Day Dx: 250.00 15)  Iron-C 100-500-1 Mg/69ml Liqd (Ferrous Gluconate-C-Folic Acid) .Marland Kitchen.. 1 Tsp Qd 16)  Triamcinolone Acetonide 0.1 % Crea (Triamcinolone Acetonide) .... Apply To Affected Skin Two Times A Day As Needed 17)  Metformin Hcl 500 Mg Xr24h-Tab (Metformin Hcl) .... 2 Pills Two Times A Day 18)  Bromocriptine Mesylate 2.5 Mg Tabs (Bromocriptine Mesylate) .Marland Kitchen.. 1 Tab At Bedtime 19)  Colace 100 Mg Caps (Docusate Sodium) .... Take 1 By Mouth Once Daily 20)  Actos 30 Mg Tabs (Pioglitazone Hcl) .Marland Kitchen.. 1 Tab Once Daily  Allergies (verified): 1)  ! Prednisone 2)  ! Ace Inhibitors 3)  ! Penicillin 4)  ! Sulfa  Past History:  Past Medical History: Last updated: 09/05/2009 SMALL BOWEL OBSTRUCTION, HX OF Family Hx of COLON CANCER CEREBROVASCULAR ACCIDENT, HX OF (ICD-V12.50) Hx of ANGIOEDEMA (ICD-995.1) GERD (ICD-530.81) HYPERTENSION (ICD-401.9) DIABETES MELLITUS (ICD-250.00)  MD roster: endo - [facility] - [facility] - clance  Review of Systems  The patient denies weight loss and weight gain.    Physical Exam  General:  obese.  no distress  Pulses:  dorsalis pedis intact bilat.  Extremities:  no deformity.  no ulcer on the feet.  feet are of normal color and temp.  no edema  Neurologic:  sensation is intact to touch on the feet   Impression & Recommendations:  Problem # 1:  DIABETES MELLITUS (ICD-250.00) apparently well-controlled  Medications Added to Medication List This Visit: 1)  Bromocriptine Mesylate 2.5 Mg Tabs (Bromocriptine mesylate) .Marland Kitchen.. 1 tab two times a day 2)  Actos 45 Mg Tabs (Pioglitazone hcl) .Marland Kitchen.. 1 tab once daily  Other Orders: Est. Patient Level III ([address]40) Administration Flu vaccine - MCR (G0008) Flu Vaccine 26yrs + MEDICARE PATIENTS (Q2039) TLB-A1C / Hgb A1C (Glycohemoglobin) (83036-A1C)  Patient Instructions: 1)   blood tests are being ordered for you today.  please call [phone] to hear your test results. 2)  good diet and exercise habits significanly improve the control of your diabetes.  please let me know if you wish to be referred to a dietician.  high blood sugar is very risky to your health.  you should see an eye doctor every year. 3)  controlling your blood pressure and cholesterol drastically reduces the damage diabetes does to your body.  this also applies to quitting smoking.  please discuss these with your doctor.  you should take an aspirin every day, unless you have been advised by a doctor not to. 4)  Please schedule a follow-up appointment in 6 months. 5)  (update: i left message on phone-tree:  increase parlodel to 2.5 mg two times a day.  increase actos to 45 mg once daily). Prescriptions: ACTOS 45 MG TABS (PIOGLITAZONE HCL) 1 tab once daily  #30 x 11   Entered and Authorized by:   Minus Breeding MD   Signed by:   Minus Breeding MD on 01/04/2010   Method used:   Electronically to        [facility] (retail)       [address]       [place], [place]  [address]       Ph: [phone]       Fax: [phone]   RxID:   [id number] BROMOCRIPTINE MESYLATE 2.5 MG TABS (BROMOCRIPTINE MESYLATE) 1 tab two times a day  #60 x 11   Entered and Authorized by:   Minus Breeding MD   Signed by:   Minus Breeding MD on 01/04/2010   Method used:   Electronically to        [facility] (retail)       [address]       [place], [place]  [address]       Ph: [phone]       Fax: [phone]   RxID:   [id number] ACTOS 30 MG TABS (PIOGLITAZONE HCL) 1 tab once daily  #30 x 11   Entered and Authorized by:   Minus Breeding MD   Signed by:   Minus Breeding MD on 01/04/2010   Method used:   Electronically to        [facility] (retail)       [address]       [place], [place]  [address]       Ph: [phone]        Fax: [phone]   RxID:   [id number]    Orders Added: 1)  Est. Patient Level III [81829] 2)  Administration Flu vaccine - MCR [G0008] 3)  Flu Vaccine 64yrs + MEDICARE PATIENTS [Q2039] 4)  TLB-A1C / Hgb A1C (Glycohemoglobin) [83036-A1C]   Immunization History:  Pneumovax Immunization History:    Pneumovax:  historical (01/21/2005)  Zostavax History:    Zostavax # 1:  zostavax (state) (01/22/2004)  Immunizations Administered:  Influenza Vaccine # 1:    Vaccine Type: Fluvax 3+    Site: left deltoid    Mfr: Sanofi Pasteur    Dose: 0.5 ml    Route: IM    Given by: Brenton Grills CMA (AAMA)    Exp. Date: 07/21/2010    Lot #: [id number]    VIS given: 2011  Flu Vaccine Consent Questions:    Do you have a history of severe allergic reactions to this vaccine? no    Any prior history of allergic reactions to egg and/or gelatin? no    Do you have a sensitivity to the preservative Thimersol? no    Do you have a past history of Guillan-Barre Syndrome? no    Do you currently have an acute febrile illness? no    Have you ever had a severe reaction to latex? no    Vaccine information given and explained to patient? yes    Are you currently pregnant? no   Immunization History:  Pneumovax Immunization History:    Pneumovax:  Historical (01/21/2005)  Zostavax History:    Zostavax # 1:  Zostavax (State) (01/22/2004)  Immunizations Administered:  Influenza Vaccine # 1:    Vaccine Type: Fluvax 3+    Site: left deltoid    Mfr: Sanofi Pasteur    Dose: 0.5 ml    Route: IM    Given by: Brenton Grills CMA (AAMA)    Exp. Date: 07/21/2010    Lot #: [id number]    VIS given: 2011    Preventive Care Screening  Colonoscopy:    Date:  01/21/2005    Results:  normal   Last Pneumovax:    Date:  01/21/2005    Results:  Historical

## 2010-02-22 NOTE — Progress Notes (Signed)
Summary: referral  Phone Note Other Incoming   Caller: pt Summary of Call: letter from pt to [facility] requesting DUAL referral to nutrition for weight loss AND DM education - order clarified to request same [facility] will arrange - letter to be scanned into EMR Initial call taken by: Newt Lukes MD,  January 10, 2010 5:22 PM  New Problems: OBESITY (ICD-278.00)   New Problems: OBESITY (ICD-278.00)   Notified pt referral has been put in. [facility] will contact her once appt has been set-up. Pt also states Dr. Everardo All sent in Actos 45mg , then change rx to 30mg . Insurance want cover because she jus pick the 45mg  up. Per Dr. Felicity Coyer ok for pt to take 1/2 of 45, until next month then start on 30mg  when insurance approves.....01/11/10@9 :30am/LMB

## 2010-02-22 NOTE — Assessment & Plan Note (Signed)
Summary: 3 MTH FU  D/T  #  STC   Vital Signs:  Patient profile:   77 year old female Height:      62 inches (157.48 cm) Weight:      233.13 pounds (105.97 kg) O2 Sat:      99 % on Room air Temp:     97.0 degrees F (36.11 degrees C) oral Pulse rate:   71 / minute BP sitting:   118 / 84  (left arm) Cuff size:   large  Vitals Entered By: Josph Macho CMA (February 14, 2009 9:25 AM)  O2 Flow:  Room air CC: 3 month follow up/ CF Is Patient Diabetic? Yes   Referring Provider:  Oliver Barre, MD  Primary Provider:  Newt Lukes MD  CC:  3 month follow up/ CF.  History of Present Illness: pt was on 4 oral agents for dm.  the metformin was stopped due to nausea few mos ago.  she says since then, cbg's have been 200's-300's.  she is certain she is taking the other 3 dm meds (actos, januvia, and glipizide).   Current Medications (verified): 1)  Nexium 40 Mg Cpdr (Esomeprazole Magnesium) .... One Tablet By Mouth Once Daily 2)  Plavix 75 Mg Tabs (Clopidogrel Bisulfate) .... One Tablet By Mouth Once Daily 3)  Aspirin 81 Mg  Tabs (Aspirin) .... One Tablet By Mouth Once Daily 4)  Benicar 20 Mg Tabs (Olmesartan Medoxomil) .... One Tablet By Mouth Once Daily 5)  Clonidine Hcl 0.2 Mg Tabs (Clonidine Hcl) .Marland Kitchen.. 1 By Mouth Two Times A Day 6)  Multivitamins   Tabs (Multiple Vitamin) .... One Tablet By Mouth Once Daily 7)  Cardizem Cd 300 Mg Xr24h-Cap (Diltiazem Hcl Coated Beads) .Marland Kitchen.. 1 By Mouth Once Daily 8)  Actos 30 Mg Tabs (Pioglitazone Hcl) .Marland Kitchen.. 1 By Mouth Once Daily 9)  Januvia 100 Mg Tabs (Sitagliptin Phosphate) .... Qd 10)  Glipizide Xl 5 Mg Xr24h-Tab (Glipizide) .Marland Kitchen.. 1 Qam 11)  Furosemide 40 Mg Tabs (Furosemide) .Marland Kitchen.. 1po Once Daily As Needed 12)  Vicodin 5-500 Mg Tabs (Hydrocodone-Acetaminophen) .... As Needed For Pain 13)  Pro-Biotic Blend  Caps (Probiotic Product) .... One Tablet By Mouth Once Daily 14)  Cranberry 405 Mg Caps (Cranberry) .... One Capsule By Mouth Once Daily 15)   Fe-Caps 250 Mg Cr-Caps (Ferrous Sulfate) .... One Tablet By Mouth Once Daily 16)  Prodigy Blood Glucose Monitor W/device Kit (Blood Glucose Monitoring Suppl) .... Use Q Day 17)  Prodigy Blood Glucose Test  Strp (Glucose Blood) .... Check Bs Two Times A Day Dx: 250.00 18)  Iron-C 100-500-1 Mg/80ml Liqd (Ferrous Gluconate-C-Folic Acid) .Marland Kitchen.. 1 Tsp Qd 19)  Triamcinolone Acetonide 0.1 % Crea (Triamcinolone Acetonide) .... Apply To Affected Skin Two Times A Day As Needed 20)  Metformin Hcl 500 Mg Tabs (Metformin Hcl) .Marland Kitchen.. 1 By Mouth Two Times A Day  Allergies (verified): 1)  ! Prednisone 2)  ! Ace Inhibitors 3)  ! Penicillin 4)  ! Sulfa  Review of Systems  The patient denies weight loss and weight gain.    Physical Exam  General:  morbidly obese.   Pulses:  dorsalis pedis intact bilat.  Extremities:  no deformity.  no ulcer on the feet.  feet are of normal color and temp.   trace right pedal edema and trace left pedal edema.   Neurologic:  sensation is intact to touch on the feet.  Additional Exam:  a1c > 12%   Impression & Recommendations:  Problem # 1:  DIABETES MELLITUS (ICD-250.00) it would be unusual for the discontinuation of metformin to cause this a1c increase (? progression of disease).  Other Orders: Est. Patient Level III ([address])  Patient Instructions: 1)  take all 4 diabetes medications (actos, metformin, januvia, and glipizide).  2)  check your blood sugar 1 times a day.  vary the time of day when you check, between before the 3 meals, and at bedtime.  also check if you have symptoms of your blood sugar being too high or too low.  please keep a record of the readings and bring it to your next appointment here.  please call us sooner if you are having low blood sugar episodes. 3)  return 3 weeks.

## 2010-02-22 NOTE — Progress Notes (Signed)
Summary: need to sched ov with [facility]  Phone Note Outgoing Call   Call placed by: Arman Filter LPN,  August 24, 2009 12:22 PM Call placed to: Patient Summary of Call: per [facility], pt needs ov with kc to discuss sleep study reslts.  ATC pt at home #. NA and unable to leave message.  Will try back later.  Aundra Millet Reynolds LPN  August 24, 2009 12:23 PM  Initial call taken by: Arman Filter LPN,  August 24, 2009 12:23 PM  Follow-up for Phone Call        ATC pt at home #.  NA and unable to leave message.  Will try back later.  Aundra Millet Reynolds LPN  August 29, 2009 5:28 PM   ATC pt at home #.  NA and unable to leave message.  This is my 3rd attempt to contact pt. Per protocol, will sign off on this message and send pt a letter to call our office to discuss test results.  Arman Filter LPN  August 31, 2009 4:46 PM

## 2010-02-22 NOTE — Progress Notes (Signed)
Summary: med refills  Phone Note Refill Request Message from:  Fax from Pharmacy on December 01, 2009 2:14 PM  Refills Requested: Medication #1:  BENICAR 20 MG TABS one tablet by mouth once daily  Medication #2:  PLAVIX 75 MG TABS one tablet by mouth once daily  Medication #3:  CARDIZEM CD 300 MG XR24H-CAP 1 by mouth once daily Initial call taken by: Orlan Leavens RMA,  December 01, 2009 2:14 PM    Prescriptions: CARDIZEM CD 300 MG XR24H-CAP (DILTIAZEM HCL COATED BEADS) 1 by mouth once daily  #30 Each x 1   Entered by:   Orlan Leavens RMA   Authorized by:   Newt Lukes MD   Signed by:   Orlan Leavens RMA on 12/01/2009   Method used:   Faxed to ...       [facility] (retail)       [address]       [place], [place]  [address]       Ph: [phone]       Fax: [phone]   RxID:   [id number] BENICAR 20 MG TABS (OLMESARTAN MEDOXOMIL) one tablet by mouth once daily  #30 x 1   Entered by:   Orlan Leavens RMA   Authorized by:   Newt Lukes MD   Signed by:   Orlan Leavens RMA on 12/01/2009   Method used:   Faxed to ...       [facility] (retail)       [address]       [place], [place]  [address]       Ph: [phone]       Fax: [phone]   RxID:   [id number] PLAVIX 75 MG TABS (CLOPIDOGREL BISULFATE) one tablet by mouth once daily  #30 x 1   Entered by:   Orlan Leavens RMA   Authorized by:   Newt Lukes MD   Signed by:   Orlan Leavens RMA on 12/01/2009   Method used:   Faxed to ...       [facility] (retail)       [address]       [place], [place]  [address]       Ph: [phone]       Fax: [phone]   RxID:   [id number]

## 2010-02-22 NOTE — Progress Notes (Signed)
Summary: [facility]  Phone Note Call from Patient Call back at Home Phone [phone]   Caller: Patient Summary of Call: Pt called stating pharmacy is now out of refill on her 30mg  Actos and can only refill 45mg . Pt is requesting a 1 mth refill of the 30mg  until appt with SAE in Dec. Per pt she WILL NOT take the 45mg . [facility] Initial call taken by: Margaret Pyle, CMA,  December 08, 2009 9:22 AM  Follow-up for Phone Call        patient safety procedures do not encourage this practice.  please let me know what other pharmacy you want me to send rx to. Follow-up by: Minus Breeding MD,  December 08, 2009 9:28 AM  Additional Follow-up for Phone Call Additional follow up Details #1::        [facility] [address] Margaret Pyle, CMA  December 08, 2009 9:42 AM     Additional Follow-up for Phone Call Additional follow up Details #2::    sent Follow-up by: Minus Breeding MD,  December 08, 2009 9:48 AM  Additional Follow-up for Phone Call Additional follow up Details #3:: Details for Additional Follow-up Action Taken: Pt informed Additional Follow-up by: Margaret Pyle, CMA,  December 08, 2009 9:50 AM  New/Updated Medications: ACTOS 30 MG TABS (PIOGLITAZONE HCL) 1 tab once daily Prescriptions: ACTOS 30 MG TABS (PIOGLITAZONE HCL) 1 tab once daily  #30 x 11   Entered and Authorized by:   Minus Breeding MD   Signed by:   Minus Breeding MD on 12/08/2009   Method used:   [facility]        [facility]  [address] [phone]* (retail)       [address]       [place], [place]  [id number]       Ph: [phone] or [phone]       Fax: [phone]   RxID:   [id number]

## 2010-02-22 NOTE — Miscellaneous (Signed)
Summary: Doctor, general practice [facility]   Imported By: Lester Farragut 04/12/2009 07:32:21  _____________________________________________________________________  External Attachment:    Type:   Image     Comment:   External Document

## 2010-02-22 NOTE — Assessment & Plan Note (Signed)
Summary: PER PT FU  STC   Vital Signs:  Patient profile:   77 year old female Height:      62 inches Weight:      235.12 pounds O2 Sat:      97 % on Room air Temp:     98.3 degrees F oral Pulse rate:   64 / minute BP sitting:   124 / 72  (left arm) Cuff size:   Ex-large  Vitals Entered By: Orlan Leavens (May 11, 2009 9:14 AM)  O2 Flow:  Room air CC: 3 month follow-up/ complaining of nasal drainage & cough Is Patient Diabetic? Yes Did you bring your meter with you today? No Pain Assessment Patient in pain? no        Primary Care Provider:  Newt Lukes MD  CC:  3 month follow-up/ complaining of nasal drainage & cough.  History of Present Illness:  here for f/u   1) anemia - has not seen dr. Juanda Chance or needed IV iron since last fall -  taking iron-rich foods in diet - also supplements denies symptoms of bleeding or fatigue  2) DM2 - reports generally well controlled sugars on current medsications - no adverse se on current meds. no hypoglycemia symptoms - home log cbgs reviewed  3) HTN -  reports compliance with ongoing medical treatment and no changes in medication dose or frequency. denies adverse side effects related to current therapy. no CP or vision changes - no swelling or edema  4) c/o nasal drainage  onset 2-3 days ago a/w cough, nonproductive no fever or sick contacts - denies seasonal allg or sneezing better symptoms with cordicidain hbp    Clinical Review Panels:  Lipid Management   Cholesterol:  147 (07/15/2008)   LDL (bad choesterol):  84 (07/15/2008)   HDL (good cholesterol):  52.30 (07/15/2008)  Diabetes Management   HgBA1C:  12.3 (02/10/2009)   Creatinine:  1.1 (02/10/2009)   Last Foot Exam:  yes (10/13/2008)   Last Flu Vaccine:  Fluvax MCR (10/13/2008)  CBC   WBC:  5.9 (02/10/2009)   RBC:  4.77 (02/10/2009)   Hgb:  13.1 (02/10/2009)   Hct:  40.6 (02/10/2009)   Platelets:  309.0 (02/10/2009)   MCV  85.1 (02/10/2009)  MCHC  32.2 (02/10/2009)   RDW  16.1 (02/10/2009)   PMN:  49.7 (02/10/2009)   Lymphs:  42.2 (02/10/2009)   Monos:  3.1 (02/10/2009)   Eosinophils:  4.6 (02/10/2009)   Basophil:  0.4 (02/10/2009)  Complete Metabolic Panel   Glucose:  334 (02/10/2009)   Sodium:  140 (02/10/2009)   Potassium:  4.2 (02/10/2009)   Chloride:  102 (02/10/2009)   CO2:  23 (02/10/2009)   BUN:  15 (02/10/2009)   Creatinine:  1.1 (02/10/2009)   Albumin:  3.8 (03/07/2008)   Total Protein:  8.3 (03/07/2008)   Calcium:  9.2 (02/10/2009)   Total Bili:  0.7 (03/07/2008)   Alk Phos:  85 (03/07/2008)   SGPT (ALT):  19 (03/07/2008)   SGOT (AST):  19 (03/07/2008)   Current Medications (verified): 1)  Nexium 40 Mg Cpdr (Esomeprazole Magnesium) .... One Tablet By Mouth Once Daily 2)  Plavix 75 Mg Tabs (Clopidogrel Bisulfate) .... One Tablet By Mouth Once Daily 3)  Aspirin 81 Mg  Tabs (Aspirin) .... One Tablet By Mouth Once Daily 4)  Benicar 20 Mg Tabs (Olmesartan Medoxomil) .... One Tablet By Mouth Once Daily 5)  Clonidine Hcl 0.2 Mg Tabs (Clonidine Hcl) .Marland Kitchen.. 1 By Mouth  Two Times A Day 6)  Multivitamins   Tabs (Multiple Vitamin) .... One Tablet By Mouth Once Daily 7)  Cardizem Cd 300 Mg Xr24h-Cap (Diltiazem Hcl Coated Beads) .Marland Kitchen.. 1 By Mouth Once Daily 8)  Actos 30 Mg Tabs (Pioglitazone Hcl) .Marland Kitchen.. 1 By Mouth Once Daily 9)  Januvia 100 Mg Tabs (Sitagliptin Phosphate) .... Qd 10)  Glipizide Xl 5 Mg Xr24h-Tab (Glipizide) .Marland Kitchen.. 1 Qam 11)  Furosemide 40 Mg Tabs (Furosemide) .Marland Kitchen.. 1po Once Daily As Needed 12)  Vicodin 5-500 Mg Tabs (Hydrocodone-Acetaminophen) .... As Needed For Pain 13)  Pro-Biotic Blend  Caps (Probiotic Product) .... One Tablet By Mouth Once Daily 14)  Cranberry 405 Mg Caps (Cranberry) .... One Capsule By Mouth Once Daily 15)  Fe-Caps 250 Mg Cr-Caps (Ferrous Sulfate) .... One Tablet By Mouth Once Daily 16)  Prodigy Blood Glucose Monitor W/device Kit (Blood Glucose Monitoring Suppl) .... Use Q Day 17)   Prodigy Blood Glucose Test  Strp (Glucose Blood) .... Check Bs Two Times A Day Dx: 250.00 18)  Iron-C 100-500-1 Mg/8ml Liqd (Ferrous Gluconate-C-Folic Acid) .Marland Kitchen.. 1 Tsp Qd 19)  Triamcinolone Acetonide 0.1 % Crea (Triamcinolone Acetonide) .... Apply To Affected Skin Two Times A Day As Needed 20)  Metformin Hcl 500 Mg Xr24h-Tab (Metformin Hcl) .... 2 Pills Two Times A Day  Allergies (verified): 1)  ! Prednisone 2)  ! Ace Inhibitors 3)  ! Penicillin 4)  ! Sulfa  Past History:  Past Medical History: SMALL BOWEL OBSTRUCTION, HX OF (ICD-V12.79) CONSTIPATION (ICD-564.00) Family Hx of COLON CANCER (ICD-153.9) INTERNAL HEMORRHOIDS (ICD-455.0) CEREBROVASCULAR ACCIDENT, HX OF (ICD-V12.50) Hx of ANGIOEDEMA (ICD-995.1) GERD (ICD-530.81) HYPERTENSION (ICD-401.9) DIABETES MELLITUS (ICD-250.00)  MD rooster: endo - [facility]  Review of Systems  The patient denies fever, chest pain, syncope, abdominal pain, and melena.    Physical Exam  General:  alert, well-developed, well-nourished, and cooperative to examination.   overweight-appearing.   Ears:  normal pinnae bilaterally, without erythema, swelling, or tenderness to palpation. TMs clear, without effusion, or cerumen impaction. Hearing grossly normal bilaterally  Mouth:  no gingival abnormalities and pharynx pink and moist.   Lungs:  normal respiratory effort, no intercostal retractions or use of accessory muscles; normal breath sounds bilaterally - no crackles and no wheezes.    Heart:  normal rate, regular rhythm, no murmur, and no rub. BLE without edema.   Impression & Recommendations:  Problem # 1:  DIABETES MELLITUS (ICD-250.00)  mgmt ongoing by endo - seems improved by log review cehck labs at pt request today Her updated medication list for this problem includes:    Aspirin 81 Mg Tabs (Aspirin) ..... One tablet by mouth once daily    Benicar 20 Mg Tabs (Olmesartan medoxomil) ..... One tablet by mouth once daily     Actos 30 Mg Tabs (Pioglitazone hcl) .Marland Kitchen... 1 by mouth once daily    Januvia 100 Mg Tabs (Sitagliptin phosphate) ..... Qd    Glipizide Xl 5 Mg Xr24h-tab (Glipizide) .Marland Kitchen... 1 qam    Metformin Hcl 500 Mg Xr24h-tab (Metformin hcl) .Marland Kitchen... 2 pills two times a day  Labs Reviewed: Creat: 1.1 (02/10/2009)    Reviewed HgBA1c results: 12.3 (02/10/2009)  6.6 (10/13/2008)  Orders: TLB-A1C / Hgb A1C (Glycohemoglobin) (83036-A1C) TLB-Lipid Panel (80061-LIPID)  Problem # 2:  HYPERTENSION (ICD-401.9)  Her updated medication list for this problem includes:    Benicar 20 Mg Tabs (Olmesartan medoxomil) ..... One tablet by mouth once daily    Clonidine Hcl 0.2 Mg Tabs (Clonidine  hcl) ..... 1 by mouth two times a day    Cardizem Cd 300 Mg Xr24h-cap (Diltiazem hcl coated beads) .Marland Kitchen... 1 by mouth once daily    Furosemide 40 Mg Tabs (Furosemide) .Marland Kitchen... 1po once daily as needed  BP today: 124/72 Prior BP: 138/84 (04/06/2009)  Labs Reviewed: K+: 4.2 (02/10/2009) Creat: : 1.1 (02/10/2009)   Chol: 147 (07/15/2008)   HDL: 52.30 (07/15/2008)   LDL: 84 (07/15/2008)   TG: 55.0 (07/15/2008)  Orders: TLB-BMP (Basic Metabolic Panel-BMET) (80048-METABOL)  Problem # 3:  ANEMIA, IRON DEFICIENCY (ICD-280.9)  Her updated medication list for this problem includes:    Fe-caps 250 Mg Cr-caps (Ferrous sulfate) ..... One tablet by mouth once daily    Iron-c 100-500-1 Mg/44ml Liqd (Ferrous gluconate-c-folic acid) .Marland Kitchen... 1 tsp qd  Hgb: 13.1 (02/10/2009)   Hct: 40.6 (02/10/2009)   Platelets: 309.0 (02/10/2009) RBC: 4.77 (02/10/2009)   RDW: 16.1 (02/10/2009)   WBC: 5.9 (02/10/2009) MCV: 85.1 (02/10/2009)   MCHC: 32.2 (02/10/2009) Ferritin: 47.6 (09/22/2008) Iron: 40 (10/13/2008)   % Sat: 12.9 (10/13/2008) B12: 303 (08/10/2008)   Folate: 17.9 (08/10/2008)   TSH: 2.16 (08/10/2008)  Orders: TLB-CBC Platelet - w/Differential (85025-CBCD)  Problem # 4:  URI (ICD-465.9)  reassured - cont cordicidian as needed - call if worse,  fever, etc Her updated medication list for this problem includes:    Aspirin 81 Mg Tabs (Aspirin) ..... One tablet by mouth once daily  Instructed on symptomatic treatment. Call if symptoms persist or worsen.   Problem # 5:  SNORING (ICD-786.09)  pt concerned with ?OSA -  body habitus certainly at risk for same - refer to pulm for eval and sleep study Her updated medication list for this problem includes:    Furosemide 40 Mg Tabs (Furosemide) .Marland Kitchen... 1po once daily as needed  Recommended fluid and salt restriction.   Orders: Pulmonary Referral (Pulmonary)  Complete Medication List: 1)  Nexium 40 Mg Cpdr (Esomeprazole magnesium) .... One tablet by mouth once daily 2)  Plavix 75 Mg Tabs (Clopidogrel bisulfate) .... One tablet by mouth once daily 3)  Aspirin 81 Mg Tabs (Aspirin) .... One tablet by mouth once daily 4)  Benicar 20 Mg Tabs (Olmesartan medoxomil) .... One tablet by mouth once daily 5)  Clonidine Hcl 0.2 Mg Tabs (Clonidine hcl) .Marland Kitchen.. 1 by mouth two times a day 6)  Multivitamins Tabs (Multiple vitamin) .... One tablet by mouth once daily 7)  Cardizem Cd 300 Mg Xr24h-cap (Diltiazem hcl coated beads) .Marland Kitchen.. 1 by mouth once daily 8)  Actos 30 Mg Tabs (Pioglitazone hcl) .Marland Kitchen.. 1 by mouth once daily 9)  Januvia 100 Mg Tabs (Sitagliptin phosphate) .... Qd 10)  Glipizide Xl 5 Mg Xr24h-tab (Glipizide) .Marland Kitchen.. 1 qam 11)  Furosemide 40 Mg Tabs (Furosemide) .Marland Kitchen.. 1po once daily as needed 12)  Vicodin 5-500 Mg Tabs (Hydrocodone-acetaminophen) .... As needed for pain 13)  Pro-biotic Blend Caps (Probiotic product) .... One tablet by mouth once daily 14)  Cranberry 405 Mg Caps (Cranberry) .... One capsule by mouth once daily 15)  Fe-caps 250 Mg Cr-caps (Ferrous sulfate) .... One tablet by mouth once daily 16)  Prodigy Blood Glucose Monitor W/device Kit (Blood glucose monitoring suppl) .... Use q day 17)  Prodigy Blood Glucose Test Strp (Glucose blood) .... Check bs two times a day dx: 250.00 18)   Iron-c 100-500-1 Mg/28ml Liqd (Ferrous gluconate-c-folic acid) .Marland Kitchen.. 1 tsp qd 19)  Triamcinolone Acetonide 0.1 % Crea (Triamcinolone acetonide) .... Apply to affected skin two times a day  as needed 20)  Metformin Hcl 500 Mg Xr24h-tab (Metformin hcl) .... 2 pills two times a day  Patient Instructions: 1)  it was good to see you today. 2)  test(s) ordered today - your results will be posted on the phone tree for review in 48-72 hours from the time of test completion; call [phone] and enter your 9 digit MRN (listed above on this page, just below your name); if any changes need to be made or there are abnormal results, you will be contacted directly.  3)  we'll make referral to [facility] for sleep evaluation of snoring and possible sleep apnea. Our office will contact you regarding this appointment once made.  4)  refils done - 5)  continue cordicidian hbp for your congestion symptoms - if you develop worsening symptoms or fever, call us and we can reconsider antibiotics but it does not appear necessary to use any anitbiotic at this time  6)  Please schedule a follow-up appointment in 4 months, sooner if problems.  Prescriptions: FUROSEMIDE 40 MG TABS (FUROSEMIDE) 1po once daily as needed  #30 x 3   Entered by:   Orlan Leavens   Authorized by:   Newt Lukes MD   Signed by:   Orlan Leavens on 05/11/2009   Method used:   Electronically to        [facility] (retail)       [address]       [place], [place]  [address]       Ph: [phone]       Fax: [phone]   RxID:   [id number] CARDIZEM CD 300 MG XR24H-CAP (DILTIAZEM HCL COATED BEADS) 1 by mouth once daily  #30 Each x 6   Entered by:   Orlan Leavens   Authorized by:   Newt Lukes MD   Signed by:   Orlan Leavens on 05/11/2009   Method used:   Electronically to        [facility] (retail)       [address]       [place], [place]  [address]       Ph:  [phone]       Fax: [phone]   RxID:   [id number] BENICAR 20 MG TABS (OLMESARTAN MEDOXOMIL) one tablet by mouth once daily  #30 x 6   Entered by:   Orlan Leavens   Authorized by:   Newt Lukes MD   Signed by:   Orlan Leavens on 05/11/2009   Method used:   Electronically to        [facility] (retail)       [address]       [place], [place]  [address]       Ph: [phone]       Fax: [phone]   RxID:   [id number] PLAVIX 75 MG TABS (CLOPIDOGREL BISULFATE) one tablet by mouth once daily  #30 x 6   Entered by:   Orlan Leavens   Authorized by:   Newt Lukes MD   Signed by:   Orlan Leavens on 05/11/2009   Method used:   Electronically to        [facility] (retail)       [address]  [place], [place]  [address]       Ph: [phone]       Fax: [phone]   RxID:   [id number]

## 2010-02-22 NOTE — Assessment & Plan Note (Signed)
Summary: rov for review of sleep study.   Copy to:  Oliver Barre, MD  Primary Aleshka Corney/Referring Ceclia Koker:  Newt Lukes MD  CC:  Pt is here for a f/u appt to discuss sleep study results.  .  History of Present Illness: The pt comes in today for discussion/review of her recent sleep study.  She was found to have an AHI of only 3/hr, and transient desat to 82%.  I have reviewed the study in detail with her, and answered all of her questions.  Current Medications (verified): 1)  Nexium 40 Mg Cpdr (Esomeprazole Magnesium) .... One Tablet By Mouth Once Daily 2)  Plavix 75 Mg Tabs (Clopidogrel Bisulfate) .... One Tablet By Mouth Once Daily 3)  Aspirin 81 Mg  Tabs (Aspirin) .... One Tablet By Mouth Once Daily 4)  Benicar 20 Mg Tabs (Olmesartan Medoxomil) .... One Tablet By Mouth Once Daily 5)  Clonidine Hcl 0.2 Mg Tabs (Clonidine Hcl) .Marland Kitchen.. 1 By Mouth Two Times A Day 6)  Multivitamins   Tabs (Multiple Vitamin) .... One Tablet By Mouth Once Daily 7)  Cardizem Cd 300 Mg Xr24h-Cap (Diltiazem Hcl Coated Beads) .Marland Kitchen.. 1 By Mouth Once Daily 8)  Januvia 100 Mg Tabs (Sitagliptin Phosphate) .... Qd 9)  Furosemide 40 Mg Tabs (Furosemide) .Marland Kitchen.. 1po Once Daily As Needed 10)  Pro-Biotic Blend  Caps (Probiotic Product) .... One Tablet By Mouth Once Daily 11)  Cranberry 405 Mg Caps (Cranberry) .... One Capsule By Mouth Once Daily 12)  Fe-Caps 250 Mg Cr-Caps (Ferrous Sulfate) .... One Tablet By Mouth Once Daily 13)  Prodigy Blood Glucose Monitor W/device Kit (Blood Glucose Monitoring Suppl) .... Use Q Day 14)  Prodigy Blood Glucose Test  Strp (Glucose Blood) .... Check Bs Two Times A Day Dx: 250.00 15)  Iron-C 100-500-1 Mg/73ml Liqd (Ferrous Gluconate-C-Folic Acid) .Marland Kitchen.. 1 Tsp Qd 16)  Triamcinolone Acetonide 0.1 % Crea (Triamcinolone Acetonide) .... Apply To Affected Skin Two Times A Day As Needed 17)  Metformin Hcl 500 Mg Xr24h-Tab (Metformin Hcl) .... 2 Pills Two Times A Day 18)  Actos 45 Mg Tabs  (Pioglitazone Hcl) .Marland Kitchen.. 1 Once Daily 19)  Bromocriptine Mesylate 2.5 Mg Tabs (Bromocriptine Mesylate) .Marland Kitchen.. 1 Tab At Bedtime 20)  Colace 100 Mg Caps (Docusate Sodium) .... Take 1 By Mouth Once Daily  Allergies (verified): 1)  ! Prednisone 2)  ! Ace Inhibitors 3)  ! Penicillin 4)  ! Sulfa  Review of Systems       The patient complains of nasal congestion/difficulty breathing through nose.  The patient denies shortness of breath with activity, shortness of breath at rest, productive cough, non-productive cough, coughing up blood, chest pain, irregular heartbeats, acid heartburn, indigestion, loss of appetite, weight change, abdominal pain, difficulty swallowing, sore throat, tooth/dental problems, headaches, sneezing, itching, ear ache, anxiety, depression, hand/feet swelling, joint stiffness or pain, rash, change in color of mucus, and fever.    Vital Signs:  Patient profile:   77 year old female Height:      62 inches Weight:      233 pounds O2 Sat:      95 % on Room air Temp:     97.7 degrees F oral Pulse rate:   70 / minute BP sitting:   140 / 72  (right arm) Cuff size:   large  Vitals Entered By: Arman Filter LPN (September 20, 2009 1:54 PM)  O2 Flow:  Room air CC: Pt is here for a f/u appt to discuss sleep study  results.   Comments Medications reviewed with patient Arman Filter LPN  September 20, 2009 1:54 PM    Physical Exam  General:  obese female in nad Nose:  patent Extremities:  edema noted, no cyanosis Neurologic:  alert, does not appear sleepy, moves all 4.   Impression & Recommendations:  Problem # 1:  SNORING (ICD-786.09) the pt's sleep study shows no clinically significant SDB, and I suspect that she has symptomatic snoring.  I have asked her to work aggressively on weight loss, and to try and avoid supine sleep.  Elevation of her upper body may help as well.  I have also mentioned to her that if she continues to gain weight, she can develop sleep apnea  Other  Orders: Est. Patient Level III ([address])  Patient Instructions: 1)  work on weight loss 2)  try to stay off your back while sleeping.  Elevating your head may help as well.

## 2010-02-22 NOTE — Assessment & Plan Note (Signed)
Summary: PER PT 4 MTH FU STC   Vital Signs:  Patient profile:   77 year old female Height:      62 inches (157.48 cm) Weight:      231.4 pounds (105.18 kg) O2 Sat:      97 % on Room air Temp:     98.2 degrees F (36.78 degrees C) oral Pulse rate:   76 / minute BP sitting:   130 / 84  (left arm) Cuff size:   large  Vitals Entered By: Orlan Leavens (February 10, 2009 9:16 AM)  O2 Flow:  Room air CC: 4 month follow-up Is Patient Diabetic? Yes Did you bring your meter with you today? No Pain Assessment Patient in pain? no        Primary Care Provider:  Newt Lukes MD  CC:  4 month follow-up.  History of Present Illness: here for f/u   1) anemia - has not seen dr. Juanda Chance or needed IV iron since last fall -  taking iron-rich foods in diet - also supplements denies symptoms of bleeding or fatigue  2) DM2 - reports higher sugars since stpping metformin in 09/2008 per endo - no hypoglycemia symptoms - home log cbgs reviewed  3) HTN -  reports compliance with ongoing medical treatment and no changes in medication dose or frequency. denies adverse side effects related to current therapy. no CP or vision changes - no swelling or edema    Clinical Review Panels:  Immunizations   Last Tetanus Booster:  Historical (01/22/2003)   Last Flu Vaccine:  Fluvax MCR (10/13/2008)  Lipid Management   Cholesterol:  147 (07/15/2008)   LDL (bad choesterol):  84 (07/15/2008)   HDL (good cholesterol):  52.30 (07/15/2008)  Diabetes Management   HgBA1C:  6.6 (10/13/2008)   Creatinine:  1.1 (08/18/2008)   Last Foot Exam:  yes (10/13/2008)   Last Flu Vaccine:  Fluvax MCR (10/13/2008)  CBC   WBC:  6.5 (10/13/2008)   RBC:  3.89 (10/13/2008)   Hgb:  10.2 (10/13/2008)   Hct:  31.5 (10/13/2008)   Platelets:  411.0 (10/13/2008)   MCV  81.0 (10/13/2008)   MCHC  32.4 (10/13/2008)   RDW  21.8 H % (10/13/2008)   PMN:  53.8 R % (10/13/2008)   Lymphs:  32.0 R % (10/13/2008)   Monos:   10.1 R % (10/13/2008)   Eosinophils:  4.1 R % (10/13/2008)   Basophil:  0.0 R % (10/13/2008)  Complete Metabolic Panel   Glucose:  114 (08/18/2008)   Sodium:  143 (08/18/2008)   Potassium:  4.2 (08/18/2008)   Chloride:  109 (08/18/2008)   CO2:  26 (08/18/2008)   BUN:  20 (08/18/2008)   Creatinine:  1.1 (08/18/2008)   Albumin:  3.8 (03/07/2008)   Total Protein:  8.3 (03/07/2008)   Calcium:  9.0 (08/18/2008)   Total Bili:  0.7 (03/07/2008)   Alk Phos:  85 (03/07/2008)   SGPT (ALT):  19 (03/07/2008)   SGOT (AST):  19 (03/07/2008)   Current Medications (verified): 1)  Nexium 40 Mg Cpdr (Esomeprazole Magnesium) .... One Tablet By Mouth Once Daily 2)  Plavix 75 Mg Tabs (Clopidogrel Bisulfate) .... One Tablet By Mouth Once Daily 3)  Aspirin 81 Mg  Tabs (Aspirin) .... One Tablet By Mouth Once Daily 4)  Benicar 20 Mg Tabs (Olmesartan Medoxomil) .... One Tablet By Mouth Once Daily 5)  Clonidine Hcl 0.2 Mg Tabs (Clonidine Hcl) .Marland Kitchen.. 1 By Mouth Two Times A Day 6)  Multivitamins   Tabs (Multiple Vitamin) .... One Tablet By Mouth Once Daily 7)  Cardizem Cd 300 Mg Xr24h-Cap (Diltiazem Hcl Coated Beads) .Marland Kitchen.. 1 By Mouth Once Daily 8)  Actos 30 Mg Tabs (Pioglitazone Hcl) .Marland Kitchen.. 1 By Mouth Once Daily 9)  Januvia 100 Mg Tabs (Sitagliptin Phosphate) .... Qd 10)  Glipizide Xl 5 Mg Xr24h-Tab (Glipizide) .Marland Kitchen.. 1 Qam 11)  Furosemide 40 Mg Tabs (Furosemide) .Marland Kitchen.. 1po Once Daily As Needed 12)  Vicodin 5-500 Mg Tabs (Hydrocodone-Acetaminophen) .... As Needed For Pain 13)  Pro-Biotic Blend  Caps (Probiotic Product) .... One Tablet By Mouth Once Daily 14)  Cranberry 405 Mg Caps (Cranberry) .... One Capsule By Mouth Once Daily 15)  Fe-Caps 250 Mg Cr-Caps (Ferrous Sulfate) .... One Tablet By Mouth Once Daily 16)  Prodigy Blood Glucose Monitor W/device Kit (Blood Glucose Monitoring Suppl) .... Use Q Day 17)  Prodigy Blood Glucose Test  Strp (Glucose Blood) .... Check Bs Two Times A Day Dx: 250.00 18)  Iron-C  100-500-1 Mg/73ml Liqd (Ferrous Gluconate-C-Folic Acid) .Marland Kitchen.. 1 Tsp Qd 19)  Triamcinolone Acetonide 0.1 % Crea (Triamcinolone Acetonide) .... Apply To Affected Skin Two Times A Day As Needed  Allergies (verified): 1)  ! Prednisone 2)  ! Ace Inhibitors 3)  ! Penicillin 4)  ! Sulfa  Past History:  Past Medical History: Last updated: 08/18/2008 SMALL BOWEL OBSTRUCTION, HX OF (ICD-V12.79) CONSTIPATION (ICD-564.00) Family Hx of COLON CANCER (ICD-153.9) INTERNAL HEMORRHOIDS (ICD-455.0) CEREBROVASCULAR ACCIDENT, HX OF (ICD-V12.50) Hx of ANGIOEDEMA (ICD-995.1) GERD (ICD-530.81) HYPERTENSION (ICD-401.9) DIABETES MELLITUS (ICD-250.00)  Review of Systems  The patient denies fever, weight loss, chest pain, syncope, and headaches.    Physical Exam  General:  alert, well-developed, well-nourished, and cooperative to examination.    Lungs:  normal respiratory effort, no intercostal retractions or use of accessory muscles; normal breath sounds bilaterally - no crackles and no wheezes.    Heart:  normal rate, regular rhythm, no murmur, and no rub. BLE without edema. Abdomen:  midline lower abd inscion healing well, steristrip intact - min serous drainage - soft, normal bowel sounds Psych:  Oriented X3, memory intact for recent and remote, normally interactive, good eye contact, not anxious appearing, not depressed appearing, and not agitated.      Impression & Recommendations:  Problem # 1:  DIABETES MELLITUS (ICD-250.00) reports cbgs not doing as well since stopping metformin in 09/2008 trying to control with diet changes check a1c again now and consider resuming metformin if rising a1c -  f/u with endo also as planned Her updated medication list for this problem includes:    Aspirin 81 Mg Tabs (Aspirin) ..... One tablet by mouth once daily    Benicar 20 Mg Tabs (Olmesartan medoxomil) ..... One tablet by mouth once daily    Actos 30 Mg Tabs (Pioglitazone hcl) .Marland Kitchen... 1 by mouth once daily     Januvia 100 Mg Tabs (Sitagliptin phosphate) ..... Qd    Glipizide Xl 5 Mg Xr24h-tab (Glipizide) .Marland Kitchen... 1 qam  Labs Reviewed: Creat: 1.1 (08/18/2008)    Reviewed HgBA1c results: 6.6 (10/13/2008)  6.7 (07/15/2008)  Orders: TLB-A1C / Hgb A1C (Glycohemoglobin) (83036-A1C) TLB-Microalbumin/Creat Ratio, Urine (82043-MALB) TLB-BMP (Basic Metabolic Panel-BMET) (80048-METABOL)  Problem # 2:  HYPERTENSION (ICD-401.9)  Her updated medication list for this problem includes:    Benicar 20 Mg Tabs (Olmesartan medoxomil) ..... One tablet by mouth once daily    Clonidine Hcl 0.2 Mg Tabs (Clonidine hcl) .Marland Kitchen... 1 by mouth two times a day  Cardizem Cd 300 Mg Xr24h-cap (Diltiazem hcl coated beads) .Marland Kitchen... 1 by mouth once daily    Furosemide 40 Mg Tabs (Furosemide) .Marland Kitchen... 1po once daily as needed  BP today: 130/84 Prior BP: 148/86 (11/28/2008)  Labs Reviewed: K+: 4.2 (08/18/2008) Creat: : 1.1 (08/18/2008)   Chol: 147 (07/15/2008)   HDL: 52.30 (07/15/2008)   LDL: 84 (07/15/2008)   TG: 55.0 (07/15/2008)  Problem # 3:  ANEMIA, IRON DEFICIENCY (ICD-280.9)  Her updated medication list for this problem includes:    Fe-caps 250 Mg Cr-caps (Ferrous sulfate) ..... One tablet by mouth once daily    Iron-c 100-500-1 Mg/49ml Liqd (Ferrous gluconate-c-folic acid) .Marland Kitchen... 1 tsp qd  Hgb: 10.2 (10/13/2008)   Hct: 31.5 (10/13/2008)   Platelets: 411.0 (10/13/2008) RBC: 3.89 (10/13/2008)   RDW: 21.8 H % (10/13/2008)   WBC: 6.5 (10/13/2008) MCV: 81.0 (10/13/2008)   MCHC: 32.4 (10/13/2008) Ferritin: 47.6 (09/22/2008) Iron: 40 (10/13/2008)   % Sat: 12.9 (10/13/2008) B12: 303 (08/10/2008)   Folate: 17.9 (08/10/2008)   TSH: 2.16 (08/10/2008)  Orders: TLB-CBC Platelet - w/Differential (85025-CBCD)  Complete Medication List: 1)  Nexium 40 Mg Cpdr (Esomeprazole magnesium) .... One tablet by mouth once daily 2)  Plavix 75 Mg Tabs (Clopidogrel bisulfate) .... One tablet by mouth once daily 3)  Aspirin 81 Mg Tabs (Aspirin)  .... One tablet by mouth once daily 4)  Benicar 20 Mg Tabs (Olmesartan medoxomil) .... One tablet by mouth once daily 5)  Clonidine Hcl 0.2 Mg Tabs (Clonidine hcl) .Marland Kitchen.. 1 by mouth two times a day 6)  Multivitamins Tabs (Multiple vitamin) .... One tablet by mouth once daily 7)  Cardizem Cd 300 Mg Xr24h-cap (Diltiazem hcl coated beads) .Marland Kitchen.. 1 by mouth once daily 8)  Actos 30 Mg Tabs (Pioglitazone hcl) .Marland Kitchen.. 1 by mouth once daily 9)  Januvia 100 Mg Tabs (Sitagliptin phosphate) .... Qd 10)  Glipizide Xl 5 Mg Xr24h-tab (Glipizide) .Marland Kitchen.. 1 qam 11)  Furosemide 40 Mg Tabs (Furosemide) .Marland Kitchen.. 1po once daily as needed 12)  Vicodin 5-500 Mg Tabs (Hydrocodone-acetaminophen) .... As needed for pain 13)  Pro-biotic Blend Caps (Probiotic product) .... One tablet by mouth once daily 14)  Cranberry 405 Mg Caps (Cranberry) .... One capsule by mouth once daily 15)  Fe-caps 250 Mg Cr-caps (Ferrous sulfate) .... One tablet by mouth once daily 16)  Prodigy Blood Glucose Monitor W/device Kit (Blood glucose monitoring suppl) .... Use q day 17)  Prodigy Blood Glucose Test Strp (Glucose blood) .... Check bs two times a day dx: 250.00 18)  Iron-c 100-500-1 Mg/35ml Liqd (Ferrous gluconate-c-folic acid) .Marland Kitchen.. 1 tsp qd 19)  Triamcinolone Acetonide 0.1 % Crea (Triamcinolone acetonide) .... Apply to affected skin two times a day as needed  Patient Instructions: 1)  it was good to see you today.  2)  test(s) ordered today - your results will be posted on the phone tree for review in 48-72 hours from the time of test completion; call [phone] and enter your 9 digit MRN (listed above on this page, just below your name); if any changes need to be made or there are abnormal results, you will be contacted directly.  3)  may consider resuming metformin or other changes in your medications depending on these lab results - you canl discuss with dr. Everardo All at your visit on next tuesday 4)  no recommended change is medicines today -  keep doing what you are doing

## 2010-02-22 NOTE — Assessment & Plan Note (Signed)
Summary: fu--stc   Vital Signs:  Patient profile:   77 year old female Height:      62 inches (157.48 cm) O2 Sat:      97 % on Room air Temp:     98.5 degrees F (36.94 degrees C) oral Pulse rate:   101 / minute BP sitting:   152 / 78  (left arm) Cuff size:   large  Vitals Entered By: Orlan Leavens RMA (January 30, 2010 1:51 PM)  O2 Flow:  Room air CC: Discuss referral Is Patient Diabetic? Yes Did you bring your meter with you today? No Pain Assessment Patient in pain? no        Primary Care Provider:  Newt Lukes MD  CC:  Discuss referral.  History of Present Illness: here to discuss nutrtion referral - feels [facility] program not offering benefits as described by CMS article  also reviewed other med issues 1) anemia - has not seen dr. Juanda Chance or needed IV iron since fall 2010 -  taking iron-rich foods in diet - also supplements denies symptoms of bleeding or fatigue  2) DM2 - reports generally well controlled sugars on current medsications - no adverse se on current meds. no hypoglycemia symptoms - home log cbgs reviewed - follows with endo for same - ?about bromocript use  3) HTN -  reports compliance with ongoing medical treatment and no changes in medication dose or frequency. denies adverse side effects related to current therapy. no CP or vision changes - no swelling or edema     Clinical Review Panels:  Immunizations   Last Tetanus Booster:  Historical (01/22/2003)   Last Flu Vaccine:  Fluvax 3+ (01/04/2010)   Last Pneumovax:  Historical (01/21/2005)   Last Zoster Vaccine:  Zostavax ([place]) (01/22/2004)  Lipid Management   Cholesterol:  167 (09/11/2009)   LDL (bad choesterol):  101 (09/11/2009)   HDL (good cholesterol):  52.70 (09/11/2009)  Diabetes Management   HgBA1C:  7.2 (01/04/2010)   Creatinine:  0.9 (01/04/2010)   Last Foot Exam:  yes (10/13/2008)   Last Flu Vaccine:  Fluvax 3+ (01/04/2010)   Last Pneumovax:  Historical  (01/21/2005)  CBC   WBC:  6.5 (01/04/2010)   RBC:  4.27 (01/04/2010)   Hgb:  12.4 (01/04/2010)   Hct:  37.3 (01/04/2010)   Platelets:  345.0 (01/04/2010)   MCV  87.3 (01/04/2010)   MCHC  33.2 (01/04/2010)   RDW  16.2 (01/04/2010)   PMN:  44.1 (01/04/2010)   Lymphs:  41.8 (01/04/2010)   Monos:  9.3 (01/04/2010)   Eosinophils:  3.9 (01/04/2010)   Basophil:  0.9 (01/04/2010)  Complete Metabolic Panel   Glucose:  95 (01/04/2010)   Sodium:  138 (01/04/2010)   Potassium:  4.0 (01/04/2010)   Chloride:  101 (01/04/2010)   CO2:  26 (01/04/2010)   BUN:  16 (01/04/2010)   Creatinine:  0.9 (01/04/2010)   Albumin:  3.8 (03/07/2008)   Total Protein:  8.3 (03/07/2008)   Calcium:  9.6 (01/04/2010)   Total Bili:  0.7 (03/07/2008)   Alk Phos:  85 (03/07/2008)   SGPT (ALT):  19 (03/07/2008)   SGOT (AST):  19 (03/07/2008)   Current Medications (verified): 1)  Nexium 40 Mg Cpdr (Esomeprazole Magnesium) .... One Tablet By Mouth Once Daily 2)  Plavix 75 Mg Tabs (Clopidogrel Bisulfate) .... One Tablet By Mouth Once Daily 3)  Aspirin 81 Mg  Tabs (Aspirin) .... One Tablet By Mouth Once Daily 4)  Benicar 20 Mg Tabs (  Olmesartan Medoxomil) .... One Tablet By Mouth Once Daily 5)  Clonidine Hcl 0.2 Mg Tabs (Clonidine Hcl) .Marland Kitchen.. 1 By Mouth Two Times A Day 6)  Multivitamins   Tabs (Multiple Vitamin) .... One Tablet By Mouth Once Daily 7)  Cardizem Cd 300 Mg Xr24h-Cap (Diltiazem Hcl Coated Beads) .Marland Kitchen.. 1 By Mouth Once Daily 8)  Januvia 100 Mg Tabs (Sitagliptin Phosphate) .... Qd 9)  Furosemide 40 Mg Tabs (Furosemide) .Marland Kitchen.. 1po Once Daily As Needed 10)  Pro-Biotic Blend  Caps (Probiotic Product) .... One Tablet By Mouth Once Daily 11)  Cranberry 405 Mg Caps (Cranberry) .... One Capsule By Mouth Once Daily 12)  Fe-Caps 250 Mg Cr-Caps (Ferrous Sulfate) .... One Tablet By Mouth Once Daily 13)  Prodigy Blood Glucose Monitor W/device Kit (Blood Glucose Monitoring Suppl) .... Use Q Day 14)  Prodigy Blood Glucose  Test  Strp (Glucose Blood) .... Check Bs Two Times A Day Dx: 250.00 15)  Iron-C 100-500-1 Mg/44ml Liqd (Ferrous Gluconate-C-Folic Acid) .Marland Kitchen.. 1 Tsp Qd 16)  Triamcinolone Acetonide 0.1 % Crea (Triamcinolone Acetonide) .... Apply To Affected Skin Two Times A Day As Needed 17)  Metformin Hcl 500 Mg Xr24h-Tab (Metformin Hcl) .... 2 Pills Two Times A Day 18)  Bromocriptine Mesylate 2.5 Mg Tabs (Bromocriptine Mesylate) .Marland Kitchen.. 1 Tab At Bedtime 19)  Colace 100 Mg Caps (Docusate Sodium) .... Take 1 By Mouth Once Daily 20)  Bromocriptine Mesylate 2.5 Mg Tabs (Bromocriptine Mesylate) .Marland Kitchen.. 1 Tab Two Times A Day 21)  Actos 30 Mg Tabs (Pioglitazone Hcl) .Marland Kitchen.. 1 Tab Once Daily  Allergies (verified): 1)  ! Prednisone 2)  ! Ace Inhibitors 3)  ! Penicillin 4)  ! Sulfa  Past History:  Past Medical History: SMALL BOWEL OBSTRUCTION, HX Family Hx of COLON CANCER CEREBROVASCULAR ACCIDENT, HX  Hx of ANGIOEDEMA GERD  HYPERTENSION DIABETES MELLITUS   MD roster: endo - [facility] - [facility] - [facility]   Review of Systems  The patient denies anorexia, weight loss, chest pain, peripheral edema, and abdominal pain.    Physical Exam  General:  alert, well-developed, well-nourished, and cooperative to examination.   overweight-appearing.   Lungs:  normal respiratory effort, no intercostal retractions or use of accessory muscles; normal breath sounds bilaterally - no crackles and no wheezes.    Heart:  normal rate, regular rhythm, no murmur, and no rub. BLE without edema. Psych:  Oriented X3, memory intact for recent and remote, normally interactive, good eye contact, min anxious appearing, not depressed appearing, and not agitated.      Impression & Recommendations:  Problem # 1:  OBESITY (ICD-278.00)  reviewed newspaper article with pt again today - will look for alt program to help with DM and weight loss  Ht: 62 (01/30/2010)   Wt: 231.0 (01/04/2010)   BMI: 42.40 (01/04/2010)  Problem # 2:   DIABETES MELLITUS (ICD-250.00)  Her updated medication list for this problem includes:    Aspirin 81 Mg Tabs (Aspirin) ..... One tablet by mouth once daily    Benicar 20 Mg Tabs (Olmesartan medoxomil) ..... One tablet by mouth once daily    Januvia 100 Mg Tabs (Sitagliptin phosphate) ..... Qd    Metformin Hcl 500 Mg Xr24h-tab (Metformin hcl) .Marland Kitchen... 2 pills two times a day    Actos 30 Mg Tabs (Pioglitazone hcl) .Marland Kitchen... 1 tab once daily  reassurance re: current meds and plans to follow - refer to nuritionist once approp center identified by pt - mgmt per endo  Labs Reviewed: Creat:  0.9 (01/04/2010)    Reviewed HgBA1c results: 7.2 (01/04/2010)  6.6 (09/11/2009)  Complete Medication List: 1)  Nexium 40 Mg Cpdr (Esomeprazole magnesium) .... One tablet by mouth once daily 2)  Plavix 75 Mg Tabs (Clopidogrel bisulfate) .... One tablet by mouth once daily 3)  Aspirin 81 Mg Tabs (Aspirin) .... One tablet by mouth once daily 4)  Benicar 20 Mg Tabs (Olmesartan medoxomil) .... One tablet by mouth once daily 5)  Clonidine Hcl 0.2 Mg Tabs (Clonidine hcl) .Marland Kitchen.. 1 by mouth two times a day 6)  Multivitamins Tabs (Multiple vitamin) .... One tablet by mouth once daily 7)  Cardizem Cd 300 Mg Xr24h-cap (Diltiazem hcl coated beads) .Marland Kitchen.. 1 by mouth once daily 8)  Januvia 100 Mg Tabs (Sitagliptin phosphate) .... Qd 9)  Furosemide 40 Mg Tabs (Furosemide) .Marland Kitchen.. 1po once daily as needed 10)  Pro-biotic Blend Caps (Probiotic product) .... One tablet by mouth once daily 11)  Cranberry 405 Mg Caps (Cranberry) .... One capsule by mouth once daily 12)  Fe-caps 250 Mg Cr-caps (Ferrous sulfate) .... One tablet by mouth once daily 13)  Prodigy Blood Glucose Monitor W/device Kit (Blood glucose monitoring suppl) .... Use q day 14)  Prodigy Blood Glucose Test Strp (Glucose blood) .... Check bs two times a day dx: 250.00 15)  Iron-c 100-500-1 Mg/106ml Liqd (Ferrous gluconate-c-folic acid) .Marland Kitchen.. 1 tsp qd 16)  Triamcinolone  Acetonide 0.1 % Crea (Triamcinolone acetonide) .... Apply to affected skin two times a day as needed 17)  Metformin Hcl 500 Mg Xr24h-tab (Metformin hcl) .... 2 pills two times a day 18)  Bromocriptine Mesylate 2.5 Mg Tabs (Bromocriptine mesylate) .Marland Kitchen.. 1 tab at bedtime 19)  Colace 100 Mg Caps (Docusate sodium) .... Take 1 by mouth once daily 20)  Bromocriptine Mesylate 2.5 Mg Tabs (Bromocriptine mesylate) .Marland Kitchen.. 1 tab two times a day 21)  Actos 30 Mg Tabs (Pioglitazone hcl) .Marland Kitchen.. 1 tab once daily  Patient Instructions: 1)  it was good to see you today. 2)  i will look to see if there are any medicare nutrition providers to help with the obesity benefits reported that are not in the [facility] system -  3)  likewise, talk to your insurance providers and help look for yourself and let me know if you find a place you would like me to refer you to.   Orders Added: 1)  Est. Patient Level III [70623]

## 2010-02-22 NOTE — Letter (Signed)
Summary: Undeliverable  Undeliverable   Imported By: Lester Dellwood 09/15/2009 10:28:43  _____________________________________________________________________  External Attachment:    Type:   Image     Comment:   External Document

## 2010-02-22 NOTE — Letter (Signed)
Summary: Referral request for Nutrition/Patient  Referral request for Nutrition/Patient   Imported By: Sherian Rein 01/17/2010 11:40:03  _____________________________________________________________________  External Attachment:    Type:   Image     Comment:   External Document

## 2010-02-22 NOTE — Assessment & Plan Note (Signed)
Summary: 1 MTH FU  STC   Vital Signs:  Patient profile:   77 year old female Height:      62 inches (157.48 cm) Weight:      237.50 pounds (107.95 kg) O2 Sat:      96 % on Room air Temp:     97.0 degrees F (36.11 degrees C) oral Pulse rate:   85 / minute BP sitting:   138 / 84  (left arm) Cuff size:   large  Vitals Entered By: Josph Macho RMA (April 06, 2009 9:14 AM)  O2 Flow:  Room air CC: 1 month follow up/ CF Is Patient Diabetic? Yes   Referring Provider:  Oliver Barre, MD  Primary Provider:  Newt Lukes MD  CC:  1 month follow up/ CF.  History of Present Illness: pt states she feels well in general.  she brings a record of her cbg's which i have reviewed today.  she checks almost exclusively in am, when it varies from low to mid-100's.    Current Medications (verified): 1)  Nexium 40 Mg Cpdr (Esomeprazole Magnesium) .... One Tablet By Mouth Once Daily 2)  Plavix 75 Mg Tabs (Clopidogrel Bisulfate) .... One Tablet By Mouth Once Daily 3)  Aspirin 81 Mg  Tabs (Aspirin) .... One Tablet By Mouth Once Daily 4)  Benicar 20 Mg Tabs (Olmesartan Medoxomil) .... One Tablet By Mouth Once Daily 5)  Clonidine Hcl 0.2 Mg Tabs (Clonidine Hcl) .Marland Kitchen.. 1 By Mouth Two Times A Day 6)  Multivitamins   Tabs (Multiple Vitamin) .... One Tablet By Mouth Once Daily 7)  Cardizem Cd 300 Mg Xr24h-Cap (Diltiazem Hcl Coated Beads) .Marland Kitchen.. 1 By Mouth Once Daily 8)  Actos 30 Mg Tabs (Pioglitazone Hcl) .Marland Kitchen.. 1 By Mouth Once Daily 9)  Januvia 100 Mg Tabs (Sitagliptin Phosphate) .... Qd 10)  Glipizide Xl 5 Mg Xr24h-Tab (Glipizide) .Marland Kitchen.. 1 Qam 11)  Furosemide 40 Mg Tabs (Furosemide) .Marland Kitchen.. 1po Once Daily As Needed 12)  Vicodin 5-500 Mg Tabs (Hydrocodone-Acetaminophen) .... As Needed For Pain 13)  Pro-Biotic Blend  Caps (Probiotic Product) .... One Tablet By Mouth Once Daily 14)  Cranberry 405 Mg Caps (Cranberry) .... One Capsule By Mouth Once Daily 15)  Fe-Caps 250 Mg Cr-Caps (Ferrous Sulfate) .... One  Tablet By Mouth Once Daily 16)  Prodigy Blood Glucose Monitor W/device Kit (Blood Glucose Monitoring Suppl) .... Use Q Day 17)  Prodigy Blood Glucose Test  Strp (Glucose Blood) .... Check Bs Two Times A Day Dx: 250.00 18)  Iron-C 100-500-1 Mg/11ml Liqd (Ferrous Gluconate-C-Folic Acid) .Marland Kitchen.. 1 Tsp Qd 19)  Triamcinolone Acetonide 0.1 % Crea (Triamcinolone Acetonide) .... Apply To Affected Skin Two Times A Day As Needed 20)  Metformin Hcl 500 Mg Xr24h-Tab (Metformin Hcl) .... 2 Pills Two Times A Day  Allergies (verified): 1)  ! Prednisone 2)  ! Ace Inhibitors 3)  ! Penicillin 4)  ! Sulfa  Past History:  Past Medical History: Last updated: 08/18/2008 SMALL BOWEL OBSTRUCTION, HX OF (ICD-V12.79) CONSTIPATION (ICD-564.00) Family Hx of COLON CANCER (ICD-153.9) INTERNAL HEMORRHOIDS (ICD-455.0) CEREBROVASCULAR ACCIDENT, HX OF (ICD-V12.50) Hx of ANGIOEDEMA (ICD-995.1) GERD (ICD-530.81) HYPERTENSION (ICD-401.9) DIABETES MELLITUS (ICD-250.00)  Review of Systems  The patient denies hypoglycemia.    Physical Exam  General:  obese.   Extremities:  no edema    Impression & Recommendations:  Problem # 1:  DIABETES MELLITUS (ICD-250.00) apparently well-controlled  Other Orders: Est. Patient Level III (09811)  Patient Instructions: 1)  check your blood sugar  1 times a day.  vary the time of day when you check, between before the 3 meals, and at bedtime.  also check if you have symptoms of your blood sugar being too high or too low.  please keep a record of the readings and bring it to your next appointment here.  please call us sooner if you are having low blood sugar episodes. 2)  same medications. 3)  Please schedule a follow-up appointment in 2 months.

## 2010-02-22 NOTE — Progress Notes (Signed)
Summary: diltiazem  Phone Note Refill Request Message from:  Fax from Pharmacy on January 29, 2010 2:44 PM  Refills Requested: Medication #1:  CARDIZEM CD 300 MG XR24H-CAP 1 by mouth once daily   Last Refilled: 12/28/2009  Method Requested: Electronic Initial call taken by: Orlan Leavens RMA,  January 29, 2010 2:44 PM    Prescriptions: CARDIZEM CD 300 MG XR24H-CAP (DILTIAZEM HCL COATED BEADS) 1 by mouth once daily  #30 Each x 5   Entered by:   Orlan Leavens RMA   Authorized by:   Newt Lukes MD   Signed by:   Orlan Leavens RMA on 01/29/2010   Method used:   Electronically to        [facility] (retail)       [address]       [place], [place]  [address]       Ph: [phone]       Fax: [phone]   RxID:   [id number]   Appended Document: diltiazem/ plavix/benicar    Clinical Lists Changes  Medications: Rx of BENICAR 20 MG TABS (OLMESARTAN MEDOXOMIL) one tablet by mouth once daily;  #30 x 5;  Signed;  Entered by: Orlan Leavens RMA;  Authorized by: Newt Lukes MD;  Method used: Electronically to [facility] [address] [place], [place]  [id number], Ph: [phone], Fax: [phone] Rx of PLAVIX 75 MG TABS (CLOPIDOGREL BISULFATE) one tablet by mouth once daily;  #30 x 5;  Signed;  Entered by: Orlan Leavens RMA;  Authorized by: Newt Lukes MD;  Method used: Electronically to [facility] [address] [place], [place]  [id number], Ph: [phone], Fax: [phone]    Prescriptions: PLAVIX 75 MG TABS (CLOPIDOGREL BISULFATE) one tablet by mouth once daily  #30 x 5   Entered by:   Orlan Leavens RMA   Authorized by:   Newt Lukes MD   Signed by:   Orlan Leavens RMA on 01/29/2010   Method used:   Electronically to        [facility] (retail)       [address]       [place], [place]  [address]       Ph: [phone]       Fax: [phone]   RxID:    [id number] BENICAR 20 MG TABS (OLMESARTAN MEDOXOMIL) one tablet by mouth once daily  #30 x 5   Entered by:   Orlan Leavens RMA   Authorized by:   Newt Lukes MD   Signed by:   Orlan Leavens RMA on 01/29/2010   Method used:   Electronically to        [facility] (retail)       [address]       [place], [place]  [address]       Ph: [phone]       Fax: [phone]   RxID:   [id number]
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
## 2010-02-22 NOTE — Progress Notes (Signed)
Summary: Actos?  ---- Converted from flag ---- ---- 12/05/2009 5:02 PM, Phetcharat Noitamyae wrote: Patient stated she has been taking Actos 30 mg since June 2011 and her A1C is 6.6. Patient has now received the 45 mg and do not know if  she should take it. Please Advise. ------------------------------  Phone Note Outgoing Call Call back at [facility] Phone [phone]   Call placed by: Margaret Pyle, CMA,  December 06, 2009 9:08 AM Call placed to: Patient Summary of Call: left message on machine for pt to return my call. Margaret Pyle, CMA  December 06, 2009 9:08 AM   Follow-up for Phone Call        Pt advise dan will contiune with 30mg  Actos until appt in Dec with SAE. Pt also expressed concern over televisioin ad linking Actos with Bladder cancer but will discuss further at OV as well. Follow-up by: Margaret Pyle, CMA,  December 07, 2009 8:44 AM    please continue same we'll go over again at dec appt

## 2010-02-22 NOTE — Letter (Signed)
Summary: [facility]  [facility]   Imported By: Lester Montezuma 02/05/2010 07:50:18  _____________________________________________________________________  External Attachment:    Type:   Image     Comment:   External Document

## 2010-02-22 NOTE — Letter (Signed)
Summary: [facility]  [address]   [place], [place] [address]   Phone: [phone]  Fax: [phone]    08/31/2009  Donette Mcraney [address] [place], [place]  [address]  Dear Ms. LITTMAN,     We have attempted to contact you by phone several times but have been unable to reach you.  Please call our office at your earliest convenience so that we may schedule you a follow up appointment with Dr. Shelle Iron to discuss your test results with you.  Thank you.        Sincerely,   Marcelyn Bruins, M.D.

## 2010-02-22 NOTE — Assessment & Plan Note (Signed)
Summary: 2 MTH FU  STC   Vital Signs:  Patient profile:   77 year old female Height:      62 inches (157.48 cm) Weight:      234 pounds (106.36 kg) O2 Sat:      97 % on Room air Temp:     98.3 degrees F (36.83 degrees C) oral Pulse rate:   78 / minute BP sitting:   122 / 78  (left arm) Cuff size:   regular  Vitals Entered By: Josph Macho RMA (Jun 08, 2009 9:25 AM)  O2 Flow:  Room air CC: 2 month follow up/ CF Is Patient Diabetic? Yes   Referring Provider:  Oliver Barre, MD  Primary Provider:  Newt Lukes MD  CC:  2 month follow up/ CF.  History of Present Illness: she brings a record of her cbg's which i have reviewed today.  it is sometimes as low as 70's in am.  pt states she feels well in general.  Current Medications (verified): 1)  Nexium 40 Mg Cpdr (Esomeprazole Magnesium) .... One Tablet By Mouth Once Daily 2)  Plavix 75 Mg Tabs (Clopidogrel Bisulfate) .... One Tablet By Mouth Once Daily 3)  Aspirin 81 Mg  Tabs (Aspirin) .... One Tablet By Mouth Once Daily 4)  Benicar 20 Mg Tabs (Olmesartan Medoxomil) .... One Tablet By Mouth Once Daily 5)  Clonidine Hcl 0.2 Mg Tabs (Clonidine Hcl) .Marland Kitchen.. 1 By Mouth Two Times A Day 6)  Multivitamins   Tabs (Multiple Vitamin) .... One Tablet By Mouth Once Daily 7)  Cardizem Cd 300 Mg Xr24h-Cap (Diltiazem Hcl Coated Beads) .Marland Kitchen.. 1 By Mouth Once Daily 8)  Actos 30 Mg Tabs (Pioglitazone Hcl) .Marland Kitchen.. 1 By Mouth Once Daily 9)  Januvia 100 Mg Tabs (Sitagliptin Phosphate) .... Qd 10)  Glipizide Xl 5 Mg Xr24h-Tab (Glipizide) .Marland Kitchen.. 1 Qam 11)  Furosemide 40 Mg Tabs (Furosemide) .Marland Kitchen.. 1po Once Daily As Needed 12)  Vicodin 5-500 Mg Tabs (Hydrocodone-Acetaminophen) .... As Needed For Pain 13)  Pro-Biotic Blend  Caps (Probiotic Product) .... One Tablet By Mouth Once Daily 14)  Cranberry 405 Mg Caps (Cranberry) .... One Capsule By Mouth Once Daily 15)  Fe-Caps 250 Mg Cr-Caps (Ferrous Sulfate) .... One Tablet By Mouth Once Daily 16)  Prodigy Blood  Glucose Monitor W/device Kit (Blood Glucose Monitoring Suppl) .... Use Q Day 17)  Prodigy Blood Glucose Test  Strp (Glucose Blood) .... Check Bs Two Times A Day Dx: 250.00 18)  Iron-C 100-500-1 Mg/73ml Liqd (Ferrous Gluconate-C-Folic Acid) .Marland Kitchen.. 1 Tsp Qd 19)  Triamcinolone Acetonide 0.1 % Crea (Triamcinolone Acetonide) .... Apply To Affected Skin Two Times A Day As Needed 20)  Metformin Hcl 500 Mg Xr24h-Tab (Metformin Hcl) .... 2 Pills Two Times A Day  Allergies (verified): 1)  ! Prednisone 2)  ! Ace Inhibitors 3)  ! Penicillin 4)  ! Sulfa  Past History:  Past Medical History: Last updated: 05/11/2009 SMALL BOWEL OBSTRUCTION, HX OF (ICD-V12.79) CONSTIPATION (ICD-564.00) Family Hx of COLON CANCER (ICD-153.9) INTERNAL HEMORRHOIDS (ICD-455.0) CEREBROVASCULAR ACCIDENT, HX OF (ICD-V12.50) Hx of ANGIOEDEMA (ICD-995.1) GERD (ICD-530.81) HYPERTENSION (ICD-401.9) DIABETES MELLITUS (ICD-250.00)  MD rooster: endo - [facility] - [facility]  Review of Systems  The patient denies syncope.    Physical Exam  General:  morbidly obese.  no distress  Pulses:  dorsalis pedis intact bilat.  Extremities:  no deformity.  no ulcer on the feet.  feet are of normal color and temp.  no edema  Neurologic:  sensation is intact to touch on the feet Additional Exam:  a1c=7.6    Impression & Recommendations:  Problem # 1:  DIABETES MELLITUS (ICD-250.00) needs increased rx  Medications Added to Medication List This Visit: 1)  Glipizide 2.5 Mg Xr24h-tab (Glipizide) .Marland Kitchen.. 1 tab each am 2)  Actos 45 Mg Tabs (Pioglitazone hcl) .Marland Kitchen.. 1 once daily 3)  Bromocriptine Mesylate 2.5 Mg Tabs (Bromocriptine mesylate) .... 1/2 tab at bedtime  Other Orders: Est. Patient Level III (81191)  Patient Instructions: 1)  check your blood sugar 1 times a day.  vary the time of day when you check, between before the 3 meals, and at bedtime.  also check if you have symptoms of your blood sugar being too high or too low.   please keep a record of the readings and bring it to your next appointment here.  please call us sooner if you have any blood sugar reading below 80. 2)  reduce glipizide to 2.5 mg each am. 3)  increase actos to 45 mg once daily. 4)  add bromocriptine 1/2 of 2.5 mg at bedtime. 5)  Please schedule a follow-up appointment in 3 months. Prescriptions: BROMOCRIPTINE MESYLATE 2.5 MG TABS (BROMOCRIPTINE MESYLATE) 1/2 tab at bedtime  #15 x 11   Entered and Authorized by:   Minus Breeding MD   Signed by:   Minus Breeding MD on 06/08/2009   Method used:   Electronically to        [facility] (retail)       [address]       [place], [place]  [address]       Ph: [phone]       Fax: [phone]   RxID:   [id number] ACTOS 45 MG TABS (PIOGLITAZONE HCL) 1 once daily  #30 x 11   Entered and Authorized by:   Minus Breeding MD   Signed by:   Minus Breeding MD on 06/08/2009   Method used:   Electronically to        [facility] (retail)       [address]       [place], [place]  [address]       Ph: [phone]       Fax: [phone]   RxID:   [id number] GLIPIZIDE 2.5 MG XR24H-TAB (GLIPIZIDE) 1 tab each am  #30 x 11   Entered and Authorized by:   Minus Breeding MD   Signed by:   Minus Breeding MD on 06/08/2009   Method used:   Electronically to        [facility] (retail)       [address]       [place], [place]  [address]       Ph: [phone]       Fax: [phone]   RxID:   [id number]
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
## 2010-02-22 NOTE — Progress Notes (Signed)
Summary: prodigy strips  ---- Converted from flag ---- ---- 01/23/2009 4:33 PM, Irma Newness wrote: She said to go ahead and send in a lower quantity if that's all that can be done. Please call her when it is done. Thanks!  Verlon Au ------------------------------       Additional Follow-up for Phone Call Additional follow up Details #2::    resent # 30 to pt pharm Follow-up by: Orlan Leavens,  January 23, 2009 4:38 PM  New/Updated Medications: PRODIGY BLOOD GLUCOSE TEST  STRP (GLUCOSE BLOOD) CHECK BS two times a day Dx: 250.00 Prescriptions: PRODIGY BLOOD GLUCOSE TEST  STRP (GLUCOSE BLOOD) CHECK BS two times a day Dx: 250.00  #60 x 4   Entered by:   Orlan Leavens   Authorized by:   Newt Lukes MD   Signed by:   Orlan Leavens on 01/23/2009   Method used:   Electronically to        [facility] (retail)       [address]       [place], [place]  [address]       Ph: [phone]       Fax: [phone]   RxID:   [id number]

## 2010-02-22 NOTE — Progress Notes (Signed)
Summary: Glucometer request  Phone Note Call from Patient Call back at Home Phone [phone]   Caller: Patient Call For: Newt Lukes MD Reason for Call: Talk to Nurse Summary of Call: Patient came into the office requesting a new glucometer. She cannot afford the test strips that were called in to the pharmacy recently. The price has increased dramatically for her. She has not been able to test her blood sugar in the last week. Initial call taken by: Irma Newness,  January 23, 2009 4:23 PM  Follow-up for Phone Call        Pt is using Prodigy diabetic supplies. Pt is req to send in # 30 so copay can be cheaper. Sent to pharm Follow-up by: Orlan Leavens,  January 23, 2009 4:41 PM  Additional Follow-up for Phone Call Additional follow up Details #1::        noted and agree - thanks Additional Follow-up by: Newt Lukes MD,  January 23, 2009 4:50 PM

## 2010-02-22 NOTE — Assessment & Plan Note (Signed)
Summary: 4 mth fu--d/t---stc   Vital Signs:  Patient profile:   77 year old female Height:      62 inches (157.48 cm) Weight:      231.0 pounds (105.00 kg) O2 Sat:      95 % on Room air Temp:     98.2 degrees F (36.78 degrees C) oral Pulse rate:   93 / minute BP sitting:   130 / 80  (left arm) Cuff size:   large  Vitals Entered By: Orlan Leavens RMA (January 04, 2010 10:25 AM)  O2 Flow:  Room air CC: 4 month follow-up Is Patient Diabetic? Yes Did you bring your meter with you today? Yes Pain Assessment Patient in pain? no        Primary Care Provider:  Newt Lukes MD  CC:  4 month follow-up.  History of Present Illness: here for f/u  1) anemia - has not seen dr. Juanda Chance or needed IV iron since fall 2010 -  taking iron-rich foods in diet - also supplements denies symptoms of bleeding or fatigue  2) DM2 - reports generally well controlled sugars on current medsications - no adverse se on current meds. no hypoglycemia symptoms - home log cbgs reviewed - follows with endo for same  3) HTN -  reports compliance with ongoing medical treatment and no changes in medication dose or frequency. denies adverse side effects related to current therapy. no CP or vision changes - no swelling or edema     Clinical Review Panels:  Lipid Management   Cholesterol:  167 (09/11/2009)   LDL (bad choesterol):  101 (09/11/2009)   HDL (good cholesterol):  52.70 (09/11/2009)  Diabetes Management   HgBA1C:  6.6 (09/11/2009)   Creatinine:  1.0 (05/11/2009)   Last Foot Exam:  yes (10/13/2008)   Last Flu Vaccine:  Fluvax MCR (10/13/2008)  CBC   WBC:  7.3 (09/11/2009)   RBC:  4.39 (09/11/2009)   Hgb:  12.7 (09/11/2009)   Hct:  38.4 (09/11/2009)   Platelets:  417.0 (09/11/2009)   MCV  87.4 (09/11/2009)   MCHC  33.0 (09/11/2009)   RDW  15.8 (09/11/2009)   PMN:  51.8 (09/11/2009)   Lymphs:  33.6 (09/11/2009)   Monos:  9.5 (09/11/2009)   Eosinophils:  4.5 (09/11/2009)  Basophil:  0.6 (09/11/2009)  Complete Metabolic Panel   Glucose:  97 (05/11/2009)   Sodium:  140 (05/11/2009)   Potassium:  4.3 (05/11/2009)   Chloride:  103 (05/11/2009)   CO2:  28 (05/11/2009)   BUN:  13 (05/11/2009)   Creatinine:  1.0 (05/11/2009)   Albumin:  3.8 (03/07/2008)   Total Protein:  8.3 (03/07/2008)   Calcium:  9.2 (05/11/2009)   Total Bili:  0.7 (03/07/2008)   Alk Phos:  85 (03/07/2008)   SGPT (ALT):  19 (03/07/2008)   SGOT (AST):  19 (03/07/2008)   Current Medications (verified): 1)  Nexium 40 Mg Cpdr (Esomeprazole Magnesium) .... One Tablet By Mouth Once Daily 2)  Plavix 75 Mg Tabs (Clopidogrel Bisulfate) .... One Tablet By Mouth Once Daily 3)  Aspirin 81 Mg  Tabs (Aspirin) .... One Tablet By Mouth Once Daily 4)  Benicar 20 Mg Tabs (Olmesartan Medoxomil) .... One Tablet By Mouth Once Daily 5)  Clonidine Hcl 0.2 Mg Tabs (Clonidine Hcl) .Marland Kitchen.. 1 By Mouth Two Times A Day 6)  Multivitamins   Tabs (Multiple Vitamin) .... One Tablet By Mouth Once Daily 7)  Cardizem Cd 300 Mg Xr24h-Cap (Diltiazem Hcl Coated Beads) .Marland KitchenMarland KitchenMarland Kitchen  1 By Mouth Once Daily 8)  Januvia 100 Mg Tabs (Sitagliptin Phosphate) .... Qd 9)  Furosemide 40 Mg Tabs (Furosemide) .Marland Kitchen.. 1po Once Daily As Needed 10)  Pro-Biotic Blend  Caps (Probiotic Product) .... One Tablet By Mouth Once Daily 11)  Cranberry 405 Mg Caps (Cranberry) .... One Capsule By Mouth Once Daily 12)  Fe-Caps 250 Mg Cr-Caps (Ferrous Sulfate) .... One Tablet By Mouth Once Daily 13)  Prodigy Blood Glucose Monitor W/device Kit (Blood Glucose Monitoring Suppl) .... Use Q Day 14)  Prodigy Blood Glucose Test  Strp (Glucose Blood) .... Check Bs Two Times A Day Dx: 250.00 15)  Iron-C 100-500-1 Mg/83ml Liqd (Ferrous Gluconate-C-Folic Acid) .Marland Kitchen.. 1 Tsp Qd 16)  Triamcinolone Acetonide 0.1 % Crea (Triamcinolone Acetonide) .... Apply To Affected Skin Two Times A Day As Needed 17)  Metformin Hcl 500 Mg Xr24h-Tab (Metformin Hcl) .... 2 Pills Two Times A Day 18)   Bromocriptine Mesylate 2.5 Mg Tabs (Bromocriptine Mesylate) .Marland Kitchen.. 1 Tab At Bedtime 19)  Colace 100 Mg Caps (Docusate Sodium) .... Take 1 By Mouth Once Daily 20)  Actos 30 Mg Tabs (Pioglitazone Hcl) .Marland Kitchen.. 1 Tab Once Daily  Allergies (verified): 1)  ! Prednisone 2)  ! Ace Inhibitors 3)  ! Penicillin 4)  ! Sulfa  Past History:  Past Medical History: SMALL BOWEL OBSTRUCTION, HX OF Family Hx of COLON CANCER CEREBROVASCULAR ACCIDENT, HX  Hx of ANGIOEDEMA GERD  HYPERTENSION DIABETES MELLITUS   MD roster: endo - [facility] - brodie pulm - clance   Review of Systems  The patient denies fever, syncope, headaches, and abdominal pain.         c/o freq urination and burning - also concern about Actos and bladder cancer  Physical Exam  General:  alert, well-developed, well-nourished, and cooperative to examination.   overweight-appearing.   Lungs:  normal respiratory effort, no intercostal retractions or use of accessory muscles; normal breath sounds bilaterally - no crackles and no wheezes.    Heart:  normal rate, regular rhythm, no murmur, and no rub. BLE without edema. Psych:  Oriented X3, memory intact for recent and remote, normally interactive, good eye contact, not anxious appearing, not depressed appearing, and not agitated.      Impression & Recommendations:  Problem # 1:  DYSURIA (ICD-788.1)  Orders: TLB-Udip w/ Micro (81001-URINE)  Encouraged to push clear liquids, get enough rest, and take acetaminophen as needed.  will consider abx if abnormal UA To be seen in 10 days if no improvement, sooner if worse.  Problem # 2:  HYPERTENSION (ICD-401.9)  Her updated medication list for this problem includes:    Benicar 20 Mg Tabs (Olmesartan medoxomil) ..... One tablet by mouth once daily    Clonidine Hcl 0.2 Mg Tabs (Clonidine hcl) .Marland Kitchen... 1 by mouth two times a day    Cardizem Cd 300 Mg Xr24h-cap (Diltiazem hcl coated beads) .Marland Kitchen... 1 by mouth once daily    Furosemide 40 Mg  Tabs (Furosemide) .Marland Kitchen... 1po once daily as needed  Orders: TLB-BMP (Basic Metabolic Panel-BMET) (80048-METABOL)  BP today: 130/80 Prior BP: 140/72 (09/20/2009)  Labs Reviewed: K+: 4.3 (05/11/2009) Creat: : 1.0 (05/11/2009)   Chol: 167 (09/11/2009)   HDL: 52.70 (09/11/2009)   LDL: 101 (09/11/2009)   TG: 68.0 (09/11/2009)  Problem # 3:  DIABETES MELLITUS (ICD-250.00)  reassurance re: current meds and plans to follow - refer to nuritionist - mgmt per endo Her updated medication list for this problem includes:    Aspirin 81 Mg  Tabs (Aspirin) ..... One tablet by mouth once daily    Benicar 20 Mg Tabs (Olmesartan medoxomil) ..... One tablet by mouth once daily    Januvia 100 Mg Tabs (Sitagliptin phosphate) ..... Qd    Metformin Hcl 500 Mg Xr24h-tab (Metformin hcl) .Marland Kitchen... 2 pills two times a day    Actos 30 Mg Tabs (Pioglitazone hcl) .Marland Kitchen... 1 tab once daily  Labs Reviewed: Creat: 1.0 (05/11/2009)    Reviewed HgBA1c results: 6.6 (09/11/2009)  7.6 (05/11/2009)  Orders: Nutrition Referral (Nutrition)  Problem # 4:  GERD (ICD-530.81)  Her updated medication list for this problem includes:    Nexium 40 Mg Cpdr (Esomeprazole magnesium) ..... One tablet by mouth once daily  asymptomatic gastroesophageal reflux controlled on Nexium 40 mg a day. An upper GI series in 2006 confirmed gastroesophageal reflux. An upper abdominal ultrasound  showed 2 non-shadowing polyps in the gallbladder which otherwise appears normal. repeat the ultrasound done  EGD: Location: [facility]   (09/23/2008)  Labs Reviewed: Hgb: 12.7 (09/11/2009)   Hct: 38.4 (09/11/2009)  Problem # 5:  ANEMIA, IRON DEFICIENCY (ICD-280.9)  Her updated medication list for this problem includes:    Fe-caps 250 Mg Cr-caps (Ferrous sulfate) ..... One tablet by mouth once daily    Iron-c 100-500-1 Mg/42ml Liqd (Ferrous gluconate-c-folic acid) .Marland Kitchen... 1 tsp qd  Orders: TLB-CBC Platelet - w/Differential  (85025-CBCD)  due for CBC check to monitor follows with GI as needed for same - last IV iron done 09/2008  Hgb: 13.1 (02/10/2009)   Hct: 40.6 (02/10/2009)   Platelets: 309.0 (02/10/2009)  Hgb: 10.8 (05/11/2009)   Hct: 33.1 (05/11/2009)   Platelets: 301.0 (05/11/2009)  Hgb: 12.7 (09/11/2009)   Hct: 38.4 (09/11/2009)   Platelets: 417.0 (09/11/2009) RBC: 4.39 (09/11/2009)   RDW: 15.8 (09/11/2009)   WBC: 7.3 (09/11/2009) MCV: 87.4 (09/11/2009)   MCHC: 33.0 (09/11/2009) Ferritin: 47.6 (09/22/2008) Iron: 87 (09/11/2009)   % Sat: 27.8 (09/11/2009) B12: 303 (08/10/2008)   Folate: 17.9 (08/10/2008)   TSH: 2.16 (08/10/2008)  Complete Medication List: 1)  Nexium 40 Mg Cpdr (Esomeprazole magnesium) .... One tablet by mouth once daily 2)  Plavix 75 Mg Tabs (Clopidogrel bisulfate) .... One tablet by mouth once daily 3)  Aspirin 81 Mg Tabs (Aspirin) .... One tablet by mouth once daily 4)  Benicar 20 Mg Tabs (Olmesartan medoxomil) .... One tablet by mouth once daily 5)  Clonidine Hcl 0.2 Mg Tabs (Clonidine hcl) .Marland Kitchen.. 1 by mouth two times a day 6)  Multivitamins Tabs (Multiple vitamin) .... One tablet by mouth once daily 7)  Cardizem Cd 300 Mg Xr24h-cap (Diltiazem hcl coated beads) .Marland Kitchen.. 1 by mouth once daily 8)  Januvia 100 Mg Tabs (Sitagliptin phosphate) .... Qd 9)  Furosemide 40 Mg Tabs (Furosemide) .Marland Kitchen.. 1po once daily as needed 10)  Pro-biotic Blend Caps (Probiotic product) .... One tablet by mouth once daily 11)  Cranberry 405 Mg Caps (Cranberry) .... One capsule by mouth once daily 12)  Fe-caps 250 Mg Cr-caps (Ferrous sulfate) .... One tablet by mouth once daily 13)  Prodigy Blood Glucose Monitor W/device Kit (Blood glucose monitoring suppl) .... Use q day 14)  Prodigy Blood Glucose Test Strp (Glucose blood) .... Check bs two times a day dx: 250.00 15)  Iron-c 100-500-1 Mg/54ml Liqd (Ferrous gluconate-c-folic acid) .Marland Kitchen.. 1 tsp qd 16)  Triamcinolone Acetonide 0.1 % Crea (Triamcinolone acetonide) ....  Apply to affected skin two times a day as needed 17)  Metformin Hcl 500 Mg Xr24h-tab (Metformin hcl) .... 2  pills two times a day 18)  Bromocriptine Mesylate 2.5 Mg Tabs (Bromocriptine mesylate) .Marland Kitchen.. 1 tab at bedtime 19)  Colace 100 Mg Caps (Docusate sodium) .... Take 1 by mouth once daily 20)  Actos 30 Mg Tabs (Pioglitazone hcl) .Marland Kitchen.. 1 tab once daily  Patient Instructions: 1)  it was good to see you today. 2)  medications reviewed - no changes 3)  test(s) ordered today - your results will be posted on the phone tree for review in 48-72 hours from the time of test completion; call [phone] and enter your 9 digit MRN (listed above on this page, just below your name); if any changes need to be made or there are abnormal results, you will be contacted directly.  4)  Please schedule a follow-up appointment in 6 months (same time as next visit with dr. Everardo All), call sooner if problems.    Orders Added: 1)  TLB-Udip w/ Micro [81001-URINE] 2)  TLB-BMP (Basic Metabolic Panel-BMET) [80048-METABOL] 3)  TLB-CBC Platelet - w/Differential [85025-CBCD] 4)  Est. Patient Level IV [14782] 5)  Nutrition Referral [Nutrition]

## 2010-02-22 NOTE — Progress Notes (Signed)
Summary: ACTOS/TEST STRIPS  Phone Note Call from Patient Call back at Home Phone [phone]   Caller: Patient Summary of Call: PT WENT TO PICK UP ACTOS REFILL AND IT WAS FOR 45MG , PT STATES SHE IS ON 30MG , SHE DOES NOT WANT TO CHANGE DOSAGE...  REQUEST REFILL OF  TEST STRIPS TO SENT TO [place], PT STATES SHE HAS BEEN CHECKING HER SUGAR MORE FREQUENTLY, REQUEST INCREASE OF DAILY CHECKS Initial call taken by: Migdalia Dk,  January 08, 2010 11:28 AM  Follow-up for Phone Call        i decreased to 30 mg once daily. ins won't pay for more than 1 check per day, in your situation Follow-up by: Minus Breeding MD,  January 08, 2010 1:13 PM  Additional Follow-up for Phone Call Additional follow up Details #1::        pt informed Additional Follow-up by: Brenton Grills CMA Duncan Dull),  January 09, 2010 1:35 PM    New/Updated Medications: ACTOS 30 MG TABS (PIOGLITAZONE HCL) 1 tab once daily Prescriptions: ACTOS 30 MG TABS (PIOGLITAZONE HCL) 1 tab once daily  #30 x 11   Entered by:   Brenton Grills CMA (AAMA)   Authorized by:   Minus Breeding MD   Signed by:   Brenton Grills CMA (AAMA) on 01/09/2010   Method used:   Electronically to        [facility] (retail)       [address]       [place], [place]  [id number]       Ph: [phone]       Fax: [phone]   RxID:   [id number] ACTOS 30 MG TABS (PIOGLITAZONE HCL) 1 tab once daily  #30 x 11   Entered and Authorized by:   Minus Breeding MD   Signed by:   Minus Breeding MD on 01/08/2010   Method used:   Electronically to        [facility]  [address] [phone]* (retail)       [address]       [place], [place]  [id number]       Ph: [phone] or [phone]       Fax: [phone]   RxID:   [id number]

## 2010-04-28 LAB — DIFFERENTIAL
Blasts: 0 %
Eosinophils Relative: 0 % (ref 0–5)
Eosinophils Relative: 1 % (ref 0–5)
Lymphs Abs: 1.9 10*3/uL (ref 0.7–4.0)
Metamyelocytes Relative: 0 %
Monocytes Relative: 9 % (ref 3–12)
Myelocytes: 0 %
Neutro Abs: 7.4 10*3/uL (ref 1.7–7.7)
Neutrophils Relative %: 66 % (ref 43–77)
Promyelocytes Absolute: 0 %
nRBC: 0 /100 WBC

## 2010-04-28 LAB — TYPE AND SCREEN
ABO/RH(D): B POS
Antibody Screen: NEGATIVE

## 2010-04-28 LAB — GLUCOSE, CAPILLARY
Glucose-Capillary: 124 mg/dL — ABNORMAL HIGH (ref 70–99)
Glucose-Capillary: 152 mg/dL — ABNORMAL HIGH (ref 70–99)
Glucose-Capillary: 155 mg/dL — ABNORMAL HIGH (ref 70–99)
Glucose-Capillary: 162 mg/dL — ABNORMAL HIGH (ref 70–99)
Glucose-Capillary: 169 mg/dL — ABNORMAL HIGH (ref 70–99)
Glucose-Capillary: 176 mg/dL — ABNORMAL HIGH (ref 70–99)
Glucose-Capillary: 180 mg/dL — ABNORMAL HIGH (ref 70–99)
Glucose-Capillary: 192 mg/dL — ABNORMAL HIGH (ref 70–99)
Glucose-Capillary: 204 mg/dL — ABNORMAL HIGH (ref 70–99)
Glucose-Capillary: 236 mg/dL — ABNORMAL HIGH (ref 70–99)
Glucose-Capillary: 288 mg/dL — ABNORMAL HIGH (ref 70–99)
Glucose-Capillary: 51 mg/dL — ABNORMAL LOW (ref 70–99)

## 2010-04-28 LAB — CBC
HCT: 24.1 % — ABNORMAL LOW (ref 36.0–46.0)
Hemoglobin: 8 g/dL — ABNORMAL LOW (ref 12.0–15.0)
MCHC: 32.5 g/dL (ref 30.0–36.0)
MCHC: 33.1 g/dL (ref 30.0–36.0)
Platelets: 370 10*3/uL (ref 150–400)
Platelets: 467 10*3/uL — ABNORMAL HIGH (ref 150–400)
RBC: 3 MIL/uL — ABNORMAL LOW (ref 3.87–5.11)
RBC: 3.1 MIL/uL — ABNORMAL LOW (ref 3.87–5.11)
RDW: 18.8 % — ABNORMAL HIGH (ref 11.5–15.5)
RDW: 19.8 % — ABNORMAL HIGH (ref 11.5–15.5)

## 2010-04-28 LAB — COMPREHENSIVE METABOLIC PANEL
ALT: 14 U/L (ref 0–35)
AST: 27 U/L (ref 0–37)
Albumin: 3.7 g/dL (ref 3.5–5.2)
Alkaline Phosphatase: 65 U/L (ref 39–117)
BUN: 12 mg/dL (ref 6–23)
GFR calc Af Amer: >60 mL/min (ref >60)
Potassium: 3.7 mEq/L (ref 3.5–5.1)
Sodium: 134 mEq/L — ABNORMAL LOW (ref 135–145)
Total Protein: 7.9 g/dL (ref 6.0–8.3)

## 2010-04-28 LAB — URINALYSIS, ROUTINE W REFLEX MICROSCOPIC
Glucose, UA: NEGATIVE mg/dL
Protein, ur: 100 mg/dL — AB
Urobilinogen, UA: 0.2 mg/dL (ref 0.0–1.0)

## 2010-04-28 LAB — BASIC METABOLIC PANEL
BUN: 6 mg/dL (ref 6–23)
CO2: 27 mEq/L (ref 19–32)
CO2: 28 mEq/L (ref 19–32)
Calcium: 8.5 mg/dL (ref 8.4–10.5)
Creatinine, Ser: 0.98 mg/dL (ref 0.4–1.2)
GFR calc Af Amer: >60 mL/min (ref >60)
GFR calc Af Amer: >60 mL/min (ref >60)
Glucose, Bld: 191 mg/dL — ABNORMAL HIGH (ref 70–99)
Potassium: 4 mEq/L (ref 3.5–5.1)
Sodium: 136 mEq/L (ref 135–145)

## 2010-04-28 LAB — URINE MICROSCOPIC-ADD ON

## 2010-04-28 LAB — PLATELET FUNCTION ASSAY: Collagen / Epinephrine: 146 seconds (ref 0–184)

## 2010-04-28 LAB — LIPASE, BLOOD: Lipase: 23 U/L (ref 11–59)

## 2010-04-28 LAB — HEMOGLOBIN A1C: Hgb A1c MFr Bld: 6.7 % — ABNORMAL HIGH (ref 4.6–6.1)

## 2010-05-07 LAB — GLUCOSE, CAPILLARY: Glucose-Capillary: 277 mg/dL — ABNORMAL HIGH (ref 70–99)

## 2010-05-08 LAB — DIFFERENTIAL
Basophils Absolute: 0 10*3/uL (ref 0.0–0.1)
Basophils Relative: 0 % (ref 0–1)
Eosinophils Absolute: 0.3 10*3/uL (ref 0.0–0.7)
Eosinophils Relative: 3 % (ref 0–5)
Lymphs Abs: 4 10*3/uL (ref 0.7–4.0)
Monocytes Absolute: 1 10*3/uL (ref 0.1–1.0)
Monocytes Relative: 9 % (ref 3–12)
Neutro Abs: 5.6 10*3/uL (ref 1.7–7.7)
Neutrophils Relative %: 51 % (ref 43–77)
nRBC: 0 /100 WBC

## 2010-05-08 LAB — URINE CULTURE: Colony Count: 15000

## 2010-05-08 LAB — URINE MICROSCOPIC-ADD ON

## 2010-05-08 LAB — POCT CARDIAC MARKERS
Myoglobin, poc: 107 ng/mL (ref 12–200)
Myoglobin, poc: 135 ng/mL (ref 12–200)
Troponin i, poc: <0.05 ng/mL (ref 0.00–0.09)
Troponin i, poc: <0.05 ng/mL (ref 0.00–0.09)

## 2010-05-08 LAB — URINALYSIS, ROUTINE W REFLEX MICROSCOPIC
Glucose, UA: 250 mg/dL — AB
Hgb urine dipstick: NEGATIVE
Protein, ur: 30 mg/dL — AB
Urobilinogen, UA: 1 mg/dL (ref 0.0–1.0)

## 2010-05-08 LAB — POCT I-STAT, CHEM 8
Calcium, Ion: 1.04 mmol/L — ABNORMAL LOW (ref 1.12–1.32)
Creatinine, Ser: 1.2 mg/dL (ref 0.4–1.2)
Glucose, Bld: 294 mg/dL — ABNORMAL HIGH (ref 70–99)
HCT: 41 % (ref 36.0–46.0)
Hemoglobin: 13.9 g/dL (ref 12.0–15.0)
Potassium: 3.9 mEq/L (ref 3.5–5.1)
TCO2: 24 mmol/L (ref 0–100)

## 2010-05-08 LAB — CBC
Hemoglobin: 12.2 g/dL (ref 12.0–15.0)
MCHC: 33.2 g/dL (ref 30.0–36.0)
RBC: 4.58 MIL/uL (ref 3.87–5.11)
WBC: 10.9 10*3/uL — ABNORMAL HIGH (ref 4.0–10.5)

## 2010-05-08 LAB — CARBOXYHEMOGLOBIN
Methemoglobin: 0.6 % (ref 0.0–1.5)
Total hemoglobin: 11.1 g/dL — ABNORMAL LOW (ref 12.5–16.0)

## 2010-05-17 ENCOUNTER — Telehealth: Payer: Self-pay | Admitting: Endocrinology

## 2010-05-17 ENCOUNTER — Encounter: Payer: Self-pay | Admitting: Endocrinology

## 2010-05-17 NOTE — Telephone Encounter (Signed)
Letter is printed

## 2010-05-17 NOTE — Telephone Encounter (Signed)
Message copied by Romero Belling on Thu May 17, 2010  7:55 AM ------      Message from: Brenton Grills      Created: Wed May 16, 2010  6:14 PM       Pt needs letter for insurance company regarding Bromocriptine

## 2010-05-17 NOTE — Telephone Encounter (Signed)
Letter mailed

## 2010-06-05 ENCOUNTER — Encounter: Payer: Self-pay | Admitting: Internal Medicine

## 2010-06-05 NOTE — Op Note (Signed)
NAMESAVAYAH, [name]                 ACCOUNT NO.:  [id number]   MEDICAL RECORD NO.:  [id number]          PATIENT TYPE:  INP   LOCATION:  1509                         FACILITY:  [facility]   PHYSICIAN:  Angelia Mould. Derrell Lolling, M.D.DATE OF BIRTH:  Jun 13, 1932   DATE OF PROCEDURE:  09/14/2008  DATE OF DISCHARGE:                               OPERATIVE REPORT   PREOPERATIVE DIAGNOSIS:  Incarcerated ventral hernia with small bowel  obstruction.   POSTOPERATIVE DIAGNOSIS:  Incarcerated ventral hernia with small bowel  obstruction.   OPERATION PERFORMED:  Exploratory laparotomy, repair incarcerated  ventral hernia with Proceed disk (6.5-cm diameter), release small bowel  obstruction.   SURGEON:  Angelia Mould. Derrell Lolling, MD.   FIRST ASSISTANT:  Troy Sine. Dwain Sarna, MD.   ESTIMATED BLOOD LOSS:  About 100 mL.   COMPLICATIONS:  None.   OPERATIVE INDICATIONS:  This is a 77 year old [place] American female,  who has had numerous operations before.  This includes at least 2  laparotomies for small bowel resection, another laparotomy for small  bowel obstruction, a ventral hernia repair with mesh, a splenectomy and  partial colectomy through a bilateral subcostal incision for some type  of embolic phenomenon, and hysterectomy.  This was all per the patient's  history.  She presented with abdominal pain and vomiting and on CT scan  was found to be morbidly obese with a loop of small bowel protruding  just to the right of the midline midway between the symphysis pubis and  the umbilicus and causing obstruction at that point.  Only 1 or 2 loops  of small bowel were dilated.  She was brought to operating room  emergently.   OPERATIVE FINDINGS:  The patient had a little more bleeding than usual  because she had been on Plavix and aspirin.  We gave platelets in the  operating room.  There was minimal bleeding at the end of the case.  The  hernia defect was just to the right of the midline in the lower abdomen  and was probably about 3 cm in diameter.  By the time we got to that  level of the fascia, the small bowel had already reduced itself.  When  opened the hernia sac we could inspect the small bowel and it looked  perfectly fine.  There was no ascites, there was no bloody fluid. there  was no evidence of ecchymoses on the bowel, and there was no odor.  She  had big piece of what looked like polypropylene mesh in the midline,  which we had to go through to do the exploration.  She had extensive  adhesions and there was absolutely no free space in the abdomen.   OPERATIVE TECHNIQUE:  Following the induction of general endotracheal  anesthesia, a Foley catheter was placed.  Intravenous antibiotics were  given.  The abdomen was prepped and draped in a sterile fashion.  The  patient was identified as correct patient and correct procedure.  A  lower midline laparotomy incision was made, excising the old scar.  Dissection was carefully carried down through the subcutaneous tissue  until  we got to the intraabdominal wall fascia.  The hernia defect was  not immediately apparent.  We dissected the subcutaneous tissue slowly  off the right and then we found the hernia defect in the lower abdomen  and found that the small bowel had been reduced.  We dissected the  subcutaneous tissue away from all of the rim of the defect.  We made a  midline incision dividing the mesh.  We lifted up the midline mesh with  Kochers and slowly entered the peritoneal space.  There really was no  free space, but we able to undermine on both sides under the rectus  sheath and see that there was no inflammatory component.  We were able  to get down to where the defect in the fascia was and to palpate that.   We elevated the hernia sac in the right side of the defect.  We opened  this up carefully and inspected the small bowel, which had been reduced.  It looked fine.  We bluntly dissected everything away from the underside   of the hernia defect.  We then closed the hernia sac with a pursestring  suture of 2-0 Vicryl.   Dr. Dwain Sarna and I discussed the strategy.  We felt that we would  directly fix only the problem at hand rather than attempt an extensive  laparotomy. We felt that it would be very treacherous to try to do a  full laparotomy.  We brought a 6.5 cm diameter piece of Proceed disc to  the operative field.  We rolled this up and inserted it into the right  lower quadrant hernia defect.  We then deployed the mesh and it deployed  smoothly all the way around.  We sutured the leaves of the mesh in place  with #1 Novofil.  We placed the sutures up through the leaves of the  mesh, up through the fascia, back down through the fascia, and back down  through the leaves to the mesh.  We then tied this on both sides and  then we cut the redundant leaves.  We checked the mesh and it was  deployed nicely, there was no bunching of this at all.  We then closed  the hernia defect fascia over the top of the mesh with interrupted  sutures of #1 Novofil.   We then irrigated the wound.  Hemostasis was fairly good at this point.  We closed the midline fascia, which basically meant we were sewing the  old polypropylene mesh back together, with multiple interrupted sutures  of #1 Novofil.  We irrigated out one more time and things were fairly  dry.  We placed a 19-French Blake drain in the depths of the wound  extending it down to the right on the lower part of the wound.  This was  brought out through a separate stab incision superiorly and sutured the  skin with a nylon suture and connected it to a suction bulb.  The skin  was simply closed with skin staples.  Clean bandages and a Velcro binder  were placed.  The patient tolerated the procedure well and was taken the  recovery room in stable condition.  Sponge, needle, and instrument  counts were correct.      Angelia Mould. Derrell Lolling, M.D.  Electronically  Signed     HMI/MEDQ  D:  09/14/2008  T:  09/14/2008  Job:  [id number]   cc:   Hedwig Morton. Juanda Chance, MD  [address]  [place]  [place] [address]   Vikki Ports A. Felicity Coyer, MD  [address] [place], [place] [address]
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
## 2010-06-05 NOTE — Consult Note (Signed)
NAMECHANTELE, [name]                 ACCOUNT NO.:  [id number]   MEDICAL RECORD NO.:  [id number]          PATIENT TYPE:  INP   LOCATION:  0105                         FACILITY:  [facility]   PHYSICIAN:  Peggye Pitt, M.D. DATE OF BIRTH:  04/15/1932   DATE OF CONSULTATION:  DATE OF DISCHARGE:                                 CONSULTATION   REQUESTING MD:  Angelia Mould. Derrell Lolling, M.D. with surgery.   PRIMARY CARE PHYSICIAN:  Valerie A. Felicity Coyer, MD.   CHIEF COMPLAINT:  Abdominal pain, nausea and vomiting.   HISTORY OF PRESENT ILLNESS:  [name] is a very pleasant, morbidly  obese 77 year old [place] American woman with a multitude of medical  comorbidities who presents to the hospital today after 24 hours of  severe epigastric and left upper quadrant abdominal pain as well as  nausea and vomiting.  The abdominal pain is severe and intermittent,  accompanied by vomiting initially of p.o. contents and then clear fluid.  She has not noticed any blood in her emesis.  So far she has vomited at  least 20 times over the past 24 hours.  She has been unable to keep any  p.o.'s down.  She was found to have on CAT scan, a small bowel  obstruction with a possible incarcerated hernia and hence Dr. Derrell Lolling has  requested [place] to follow along with them for all of her medical  comorbidities.   ALLERGIES:  SHE HAS STATED ALLERGIES TO PENICILLIN, SULFA AS WELL AS ACE  INHIBITORS, WHICH CAUSED ANGIOEDEMA.   PAST MEDICAL HISTORY:  Significant for:  1. Type 2 diabetes mellitus.  2. Hypertension.  3. Morbid obesity.  4. History of TIAs.  5. She also had a partial colectomy in the past secondary to a blood      clot in her intestine.  I am not sure why she is hypercoagulable.      She has never heard that she has any heart problems or atrial      fibrillation.  It does appear that at one point she had been on      Coumadin for both the clots in her mesenteric arteries as well as a      left upper extremity  clot, but this has since been discontinued.   HOME MEDICATIONS:  Include:  1. Glipizide 5 mg daily.  2. Plavix 75 mg daily.  3. Aspirin 81 mg daily.  4. Benicar 20 mg daily.  5. Clonidine 0.2 mg twice daily.  6. Actos 30 mg daily.  7. Januvia 100 mg daily.  8. Nexium 40 mg daily.  9. Metformin 500 mg twice daily.  10.Diltiazem 300 mg daily.  11.Lasix 40 mg as needed for lower extremity swelling.   SOCIAL HISTORY:  She has 5 children; all healthy except for blood  pressure control issues.  She denies any alcohol, tobacco or illicit  drug use.   FAMILY HISTORY:  Noncontributory.   REVIEW OF SYSTEMS:  Is negative except as already mentioned in the HPI,  most notably negative for fever, chills, diarrhea.   PHYSICAL EXAM:  VITAL SIGNS:  Upon admission, blood pressure 174/78,  heart rate 82, respirations 20, O2 sats 100% on room air with a  temperature of 97.6.  In general she is alert, awake, oriented x3, does appear to be in  moderate-to-severe distress secondary to her abdominal pain.  HEENT:  Normocephalic, atraumatic.  Her pupils are equal, reactive to light and  accommodation with intact extraocular movements.  Her neck is supple.  No JVD.  No lymphadenopathy.  No bruits.  No  goiter.  Her heart is regular rate and rhythm with no murmurs, rubs or gallops.  Her lungs are clear to auscultation bilaterally.  Her abdomen is obese, soft, tender to palpation to the epigastric and  left upper quadrant area, with multiple scars.  She is so obese that I  cannot palpate her hernia site.  EXTREMITIES:  She has about 1+ pitting edema bilaterally up to her mid  shins.  Her neurologic exam is grossly intact and nonfocal.   LABS:  Upon admission, sodium 134, potassium 3.7, chloride 103, bicarb  24, BUN 12, creatinine 0.98 with a glucose of 176.  WBC is 9.3,  hemoglobin 10.4 with an MCV of 79.4 and a platelet count of 467.  Urinalysis is negative.  Lipase is 23.  A CT scan of the abdomen  and  pelvis shows a small bowel obstruction secondary to a ventral hernia  with early inflammation with possible gallstones.  CT of the pelvis is  negative.   ASSESSMENT AND PLAN:  1. Small bowel obstruction with a possible incarcerated/strangulated      hernia.  I have discussed her case with Dr. Derrell Lolling, who believes      that he needs to take the patient to the operating room tonight.      The patient does have an increased risk of bleeding secondary to      her being on both the aspirin and the Plavix concomitantly,      however, I have no way of reversing this.  Dr. Derrell Lolling has ready      ordered a platelet function assay and we will await these results.      It is unclear to me as to why she is on both the Plavix and the      aspirin.  She does have a history of transient ischemic attack but      I would think that the Plavix would have been substituted for the      aspirin if that were the case.  She does not appear to have any      coronary artery disease.  I would suggest maybe simply discharging      her on Plavix without the aspirin.  2. Type 2 diabetes mellitus.  Will check a hemoglobin A1c.  We will      place her on a resistant sliding scale insulin.  Will refrain from      starting basal insulin at this point, but given her weight, I      suspect that she may require some basal insulin.  We will hold all      of her p.o. diabetic medications given her current n.p.o. status.  3. Hypertension.  Will have to hold all of her home antihypertensives      secondary to her n.p.o. status.  At this point will proceed with      ordering some intravenous metoprolol as well as some p.r.n.      hydralazine intravenous.  4. Microcytic anemia.  She does not have any coronary artery disease,      so will not transfuse her unless her hemoglobin were to drop below      8.  Will also check an anemia panel to make sure she is not iron      deficient.  5. History of transient ischemic attacks.   Will proceed with holding      the aspirin and Plavix at this point given her planned surgery for      today.  As stated above, I am unsure as to the reason why she is on      both the Plavix and the aspirin.  She has had some mesenteric clots      as well as an upper extremity deep vein thrombosis.  I wonder if      she may have a lupus anticoagulant or some other form of      hypercoagulability.  Will order a hypercoagulable panel at this      point.  It does not appear that she has a history of atrial      fibrillation.  6. For prophylaxis while in the hospital, we will place her on      Protonix for gastrointestinal prophylaxis and on sequential      compression devices for deep vein thrombosis prophylaxis given her      surgery in the near future.   We appreciate the opportunity to take care of [name] and we will  follow along with you.      Peggye Pitt, M.D.  Electronically Signed     EH/MEDQ  D:  09/14/2008  T:  09/14/2008  Job:  [id number]   cc:   Angelia Mould. Derrell Lolling, M.D.  [address]  [place]  [place] [address]

## 2010-06-08 NOTE — Discharge Summary (Signed)
[name], [name]                 ACCOUNT NO.:  [id number]   MEDICAL RECORD NO.:  [id number]          PATIENT TYPE:  INP   LOCATION:  5154                         FACILITY:  [facility]   PHYSICIAN:  Currie Paris, M.D.DATE OF BIRTH:  [birth year]   DATE OF ADMISSION:  07/25/2007  DATE OF DISCHARGE:  07/26/2007                               DISCHARGE SUMMARY   FINAL DIAGNOSES:  1. Partial small-bowel obstruction.  2. Hyperglycemia.  3. Probable urinary tract infection.   HISTORY:  [name] is a 74-year lady admitted with abdominal pain,  nausea, vomiting and a CT scan which showed some dilated small bowel  loops.  She is known to have non-insulin-dependent diabetes,  hypertension, and a history of angioedema.   HOSPITAL COURSE:  The patient was admitted and begun on some IV fluids.  She thought to be at the time of admission was really just a little  volume depleted and had a urinary tract infection.  She was admitted  just to be sure that there was no significant bowel obstruction.  On  July 26, 2007, I followed up later, she felt well.  She tolerated a  followup solid diet and was not having any particular pain.  Because of  her urinary tract infection, she was going to be sent home on Cipro.  She wished to be followed up by Dr. Sharyn Lull because of her history of  diabetes.  She had her metformin and Macrobid doses adjusted.   The patient is to be followed as noted with Dr. Sharyn Lull.  If she has  further abdominal complaints, she is to call our office.      Currie Paris, M.D.  Electronically Signed     CJS/MEDQ  D:  08/19/2007  T:  08/19/2007  Job:  [id number]   cc:   Eduardo Osier. Sharyn Lull, M.D.

## 2010-06-08 NOTE — Consult Note (Signed)
[place]. [facility]  Patient:    [name], [name] Visit Number: [id number] MRN: [id number]          Service Type: MED Location: [id number] Attending Physician:  Robynn Pane Dictated by:   Genene Churn. Love, M.D. Proc. Date: 04/01/01 Admit Date:  04/01/2001 Discharge Date: 04/02/2001   CC:         Eduardo Osier. Sharyn Lull, M.D.   Consultation Report  PATIENTS ADDRESS:  [address], [place], [place] [address]  DATE OF BIRTH:  [birth year]  REQUESTING PHYSICIAN:  Eduardo Osier. Sharyn Lull, M.D.  REASON FOR CONSULTATION:  This 77 year old, right-handed black divorced female seen at the request of Dr. Sharyn Lull for evaluation of gait disorder.  HISTORY OF PRESENT ILLNESS:  The patient is divorced and lives by herself. She has a 27 year history of hypertension and diabetes mellitus. 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 At that time she was placed on Coumadin therapy for atrial fibrillation. She had developed GI bleeding, however, while on Coumadin; has not had recurrent atrial fibrillation in 1-1/2 years, and has been on aspirin therapy. There is no history of clinical stroke.  MEDICATIONS:  Glucotrol, Avandia, Hyzaar, Tiazac, Glucophage, and aspirin.  She was in her usual state of health until the morning of admission when she awoke and noted difficulty with ataxia, dizziness and vague right-sided headache. She went back to bed and awoke again in the morning with similar symptoms. She called her daughter but has been able to walk. There were no spinning spells, recurrent nausea and vomiting, double vision, hiccups, blackout spells, or seizures. She has noted no associated chest pain or palpitations. She does not drink alcohol and does not smoke cigarettes.  PHYSICAL EXAMINATION:  GENERAL:  A well-developed, obese, black female.  VITAL SIGNS:  For  blood pressure, large adult cuff, right and left arm 160/80, heart rate 84 and regular. No bruits. Neck was supple.  MENTAL STATUS:  She was alert and oriented x 3. Cranial nerve examination revealed visual fields full, discs flat. Cranial nerve examination showed evidence of nystagmus with counterclockwise component. The pupils reacted from 4-3 bilaterally. Corneas were present. There was no 7th nerve palsy. Hearing was intact. Air conduction was present. Bone conduction. Tongue was midline. Uvula was midline. Gags were presents. Sternocleidomastoid trapezius testing were normal. Motor examination revealed good strength in the upper and lower extremities. Good finger-to-nose; good heel-to-shin. She had a wide based gait. Sensory examination was intact with 2+ reflexes, decreased ankle jerks, plantar responses that were downgoing. MRI showed evidence of an acute mid vermis and anterior vermis cerebellar small vessel ischemic type stroke. She also had evidence of some small vessel ischemic changes. Intracerebellar hemisphere:  She had decreased in the right vertebral.  IMPRESSION: 1. Acute vermian stroke, Code 434.01. 2. Gait disorder secondary to #1, Code 781.2. 3. Hypertension, Code 796.2. 4. Diabetes mellitus, Code 250.60.  PLAN:  At this time is to add Plavix to the aspirin and obtain a 2D echocardiogram and have her see physical therapy. Dictated by:   Genene Churn. Love, M.D. Attending Physician:  Robynn Pane DD:  04/01/01 TD:  04/03/01 Job: [id number]

## 2010-06-08 NOTE — Op Note (Signed)
[name], [name]                 ACCOUNT NO.:  [id number]   MEDICAL RECORD NO.:  [id number]          PATIENT TYPE:  AMB   LOCATION:  ENDO                         FACILITY:  [facility]   PHYSICIAN:  Anselmo Rod, M.D.  DATE OF BIRTH:  Aug 17, 1932   DATE OF PROCEDURE:  04/02/2004  DATE OF DISCHARGE:                                 OPERATIVE REPORT   PROCEDURE PERFORMED:  Screening colonoscopy.   ENDOSCOPIST:  Charna [name], M.D.   INSTRUMENT USED:  Olympus video colonoscope.   INDICATIONS FOR PROCEDURE:  A 77 year old African-American female with a  history of rectal bleeding.  Rule out colonic polyps, masses, etc.   PREPROCEDURE PREPARATION:  Informed consent was procured from the patient.  The patient was fasted for eight hours prior to the procedure and prepped  with a bottle of magnesium citrate and a gallon of GoLYTELY the night prior  to the procedure.  The risks and benefits of the procedure including a 10%  miss rate for colon polyps or cancers was discussed with the patient as  well.  The patient was advised to stop her Coumadin five days prior to  procedure after permission was procured from [facility]. Sharyn Lull, M.D.   PREPROCEDURE PHYSICAL:  The patient had stable vital signs.  Neck supple.  Chest clear to auscultation.  S1 and S2 regular.  Abdomen obese, nontender  with normal bowel sounds.  No hepatosplenomegaly appreciated.   DESCRIPTION OF PROCEDURE:  The patient was placed in left lateral decubitus  position and sedated with 70 mg of Demerol and 7.5 mg of Versed in slow  incremental doses.  Once the patient was adequately sedated and maintained  on low flow oxygen and continuous cardiac monitoring, the Olympus video  colonoscope was advanced from the rectum to the cecum.  The appendicular  orifice and ileocecal valve were clearly visualized and photographed.  The  patient's position had to be changed from the left lateral to the supine  position with gentle application  of abdominal pressure to reach the cecum.  No masses, polyps, erosions, ulcerations or diverticula were seen.  There  was some residual stool in the colon and multiple washes were done.  Retroflexion in the rectum revealed small internal hemorrhoids.  No other  masses, polyps, etc. were appreciated.  The patient tolerated the procedure  well without immediate complications.   IMPRESSION:  1.  Normal colonoscopy up to the cecum except for a small internal      hemorrhoids.  2.  No masses polyps or diverticula seen.   RECOMMENDATIONS:  1.  Continue high fiber diet with liberal fluid intake.  2.  Proctofoam application to the rectum if rectal bleeding and pain      continue.  3.  Repeat colonoscopy in the next 10 years unless the patient develops any      abnormal symptoms in the interim.  4.  Restart Coumadin today.  5.  Outpatient followup as need arises.      JNM/MEDQ  D:  04/02/2004  T:  04/02/2004  Job:  [id number]   cc:  Eduardo Osier. Harwani, M.D.  [address]     [address]  [place]  [place] [address]  Fax: [phone]   Kari Baars, M.D.  [address]  [place], [place] [address]  Fax: [phone]

## 2010-06-08 NOTE — Discharge Summary (Signed)
[place]  Patient:    Joan, [name] Visit Number: [id number] MRN: [id number]          Service Type: MED Location: [id number] Attending Physician:  Robynn Pane Dictated by:   [name], M.D. Admit Date:  04/01/2001 Discharge Date: 04/02/2001   CC:         [name]. Love, M.D.   Discharge Summary  ADMITTING DIAGNOSES: 1. 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 2. Mild to moderate intracranial atherosclerotic disease. 3. Hypertension. 4. Noninsulin-dependent diabetes mellitus. 5. Morbid obesity. 6. History of atrial fibrillation in the past. 7. History of rectal bleeding.  FINAL DIAGNOSES: 1. 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 2. Mild to moderate intracranial atherosclerotic disease. 3. Hypertension. 4. Noninsulin-dependent diabetes mellitus. 5. Morbid obesity. 6. History of atrial fibrillation in the past. 7. History of rectal bleeding.  DISCHARGE MEDICATIONS: 1. Glucotrol XL 10 mg p.o. q.a.m. 2. Avandia 4 mg p.o. q.a.m. 3. Glucophage 500 mg one tablet with lunch and 1000 mg with supper. 4. Cardizem CD 300 mg p.o. b.i.d. 5. Hyzaar 100/25 mg one tablet daily. 6. Enteric-coated aspirin 325 mg one tablet daily. 7. Plavix 75 mg one tablet daily with food. 8. Protonix 40 mg one tablet daily a half hour before breakfast.  ACTIVITY:  As tolerated.  DIET:  Low-salt, low-cholesterol 1800 calorie ADA diet.  DISCHARGE INSTRUCTIONS:  Patient has been advised to monitor blood sugar twice daily and chart and also advised to monitor blood pressure closely daily.  FOLLOW-UP:  Follow up with me and Dr. Sandria [name] in two weeks.  CONDITION ON DISCHARGE:  Stable.  HISTORY OF PRESENT ILLNESS:  Ms. [name] is a 77 year old black female with past medical history significant for hypertension, noninsulin-dependent diabetes mellitus, morbid obesity, history of atrial fibrillation in  the past, history of rectal bleeding while on Coumadin.  She came to the ER complaining of unsteady gait since early this morning.  She states she, in fact, woke up last night around 12 a.m., while going to the bathroom had unsteady gait. Went back to bed.  She thought she was sleepy and did not seek any medical attention.  This a.m. noticed some low gait problem and some numbness in the left arm and hand.  Checked her blood sugar, which was in normal range. Denies any slurred speech, blurring of vision, weakness in the arms or legs. Denies any significant headache.  Denies any fall.  Denies chest pain, shortness of breath, palpitation, lightheadedness, or syncopal episode.  PAST MEDICAL HISTORY:  As above.  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  Status post umbilical hernia repair in 1995.  Status post hysterectomy in 1973.  She had small bowel obstruction in 1994.  SOCIAL HISTORY:  She is single.  No history of smoking or alcohol abuse. 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  ALLERGIES:  IODINE and ACE INHIBITORS.  FAMILY HISTORY:  Her father died of hypertension.  He was epileptic.  Mother died of renal failure.  She was diabetic, had stroke, had also had MI.  One brother had congestive heart failure.  He was diabetic.  One brother died of bleeding peptic ulcer.  He had also _____.  MEDICATIONS:  Glucotrol XL 10 mg p.o. q.a.m., Avandia 4 mg p.o. q.d., Glucophage 500 mg with  lunch and 1000 mg with supper, Cardizem CD 300 mg p.o. b.i.d., Hyzaar 100/25 mg p.o. q.d., enteric-coated aspirin 325 mg p.o. q.d. Coumadin was stopped approximately 1-1/2 years ago following rectal bleeding.  PHYSICAL EXAMINATION:  VITAL SIGNS:  Blood pressure was 137/77, pulse was 74 and regular.  GENERAL:  She was alert and oriented x3 in  no acute distress.  HEENT:  The conjunctivae were pink.  NECK:  Supple.  No JVD, no bruit.  CHEST:  Lungs were clear to auscultation without rhonchi or rales.  CARDIAC:  S1, S2 were normal.  They were soft.  There was no S3 gallop or murmur.  ABDOMEN:  Soft, bowel sounds were present, nontender.  There were multiple surgical scars.  EXTREMITIES:  There was no clubbing, cyanosis, or edema.  NEUROLOGIC:  She was grossly intact except very minimal ataxic gait.  LABORATORY DATA:  Hemoglobin was 12.9, hematocrit 38.9, platelet count of 376. Sodium was 140, potassium 4.7, glucose 69, BUN 17, creatinine 0.8.  Her cholesterol was 137, LDL of 77, HDL of 47.  Hemoglobin a1C was still elevated at 8.3.  2 D echo done showed good LV systolic function, ejection fraction in the range of 55-65%.  The aortic sinuses were in high normal range, size was 34 mm. Ascending aorta was slightly enlarged, which was 40 mm.  There was no evidence of left atrial or LV thrombus.  MRI and MRA of the brain showed small hyperacute nonhemorrhagic infarct in the superior vermis, mild atrophy with mild to moderate nonspecific white matter-type changes, most likely the present sequelae of small-vessel disease.  MRA showed mild to moderate intracranial atherosclerotic-type changes.  EKG showed normal sinus rhythm with first degree AV block, a left anterior fascicular block.  There were no acute ischemic changes.  HOSPITAL COURSE:  The patient was admitted to telemetry unit.  The patient did not have any further episodes of ataxia during the hospital stay.  The patients blood pressure is well-controlled.  The patient ambulated safely and independently with altered gait pattern, favoring right lower extremity, which according to the patient and family is baseline level of functioning.  The patient denies any headaches or blurring of vision of any weakness during the  hospital stay.  Her gait has been steady,  baseline as above.  The patient will be discharged home on her home medications except Plavix and Protonix have been added as above.  The patient will be followed up by me in two weeks and by Dr. Sandria [name] in two weeks. Dictated by:   [name], M.D. Attending Physician:  Robynn Pane DD:  04/02/01 TD:  04/04/01 Job: [id number]

## 2010-06-08 NOTE — Procedures (Signed)
[place]  Patient:    [name], [name]                        MRN: [id number] Proc. Date: 10/03/99 Adm. Date:  [id number] Disc. Date: [id number] Attending:  Charna [name] CC:         Eduardo Osier. Sharyn Lull, M.D.   Procedure Report  DATE OF BIRTH:  1932-10-15  REFERRING PHYSICIAN:  Eduardo Osier. Sharyn Lull, M.D.  PROCEDURE:  Colonoscopy.  ENDOSCOPIST:  Anselmo Rod, M.D.  INSTRUMENTS USED:  Olympus video colonoscope.  INDICATIONS:  Rectal bleeding and family history of colon cancer in a 77 year old black female.  Rule out colonic polyps, masses, hemorrhoids, etc.  PREPROCEDURE PREPARATION:  Informed consent was procured from the patient. The patient was fasted for eight hours prior to the procedure and prepped with a bottle of magnesium citrate and a gallon of NuLytely the night prior to the procedure.  PREPROCEDURE PHYSICAL EXAMINATION:  VITAL SIGNS:  The patient had stable vital signs.  NECK:  Supple.  CHEST:  Clear to auscultation, S1, S2 regular.  ABDOMEN:  Soft with normal abdominal bowel sounds.  DESCRIPTION OF PROCEDURE:  The patient was placed in the left lateral decubitus position and sedated with 50 mg of Demerol and 5 mg of Versed intravenously.  Once the patient was adequately sedated and maintained on low-flow oxygen and continuous cardiac monitoring, the Olympus video colonoscope was advanced from the rectum to the cecum with difficulty secondary to some residual stool in the dependent areas of the colon. No large masses or polyps were seen.  The patient had small internal hemorrhoids and tolerated the procedure well without complications.  The procedure was complete up to the cecum.  A very small lesion may have been missed secondary to a relatively inadequate prep.  IMPRESSION: 1. No large masses or polyps seen. 2. No evidence of diverticulosis. 3. Small internal hemorrhoids.  RECOMMENDATIONS: 1. The patient has been  advised to increase her fluid and fiber in her 2. Outpatient followup advised in two weeks.  Further recommendation will be    made at that time. DD:  10/03/99 TD:  10/05/99 Job: [id number]

## 2010-07-12 ENCOUNTER — Ambulatory Visit: Payer: Self-pay | Admitting: Endocrinology

## 2010-07-12 ENCOUNTER — Ambulatory Visit: Payer: Self-pay | Admitting: Internal Medicine

## 2010-10-15 LAB — I-STAT 8, (EC8 V) (CONVERTED LAB)
Acid-Base Excess: 2
HCT: 40
Operator id: 288331
TCO2: 26
pCO2, Ven: 32.1 — ABNORMAL LOW
pH, Ven: 7.495 — ABNORMAL HIGH

## 2010-10-15 LAB — POCT I-STAT CREATININE
Creatinine, Ser: 1.2
Operator id: 288331

## 2010-10-18 LAB — DIFFERENTIAL
Blasts: 0
Metamyelocytes Relative: 0
Monocytes Relative: 4
Myelocytes: 0
nRBC: 0

## 2010-10-18 LAB — COMPREHENSIVE METABOLIC PANEL
ALT: 14
Albumin: 3.1 — ABNORMAL LOW
Calcium: 8.7
GFR calc Af Amer: 45 — ABNORMAL LOW
Glucose, Bld: 288 — ABNORMAL HIGH
Sodium: 131 — ABNORMAL LOW
Total Protein: 6.8

## 2010-10-18 LAB — URINE MICROSCOPIC-ADD ON

## 2010-10-18 LAB — CBC
HCT: 41.8
MCV: 80.1
Platelets: 591 — ABNORMAL HIGH
RDW: 17.5 — ABNORMAL HIGH

## 2010-10-18 LAB — URINALYSIS, ROUTINE W REFLEX MICROSCOPIC
Glucose, UA: NEGATIVE
Hgb urine dipstick: NEGATIVE
Ketones, ur: 15 — AB
Protein, ur: 30 — AB
pH: 5.5

## 2010-10-18 LAB — POCT CARDIAC MARKERS
Myoglobin, poc: 87.3
Operator id: 161631
Troponin i, poc: <0.05

## 2010-10-18 LAB — LIPASE, BLOOD: Lipase: 24

## 2010-10-18 LAB — HEMOGLOBIN A1C
Hgb A1c MFr Bld: 7.1 — ABNORMAL HIGH
Mean Plasma Glucose: 175

## 2010-10-18 LAB — AMYLASE: Amylase: 81

## 2010-10-18 LAB — APTT: aPTT: 24

## 2010-11-12 ENCOUNTER — Other Ambulatory Visit (HOSPITAL_COMMUNITY): Payer: Self-pay | Admitting: Cardiology

## 2010-11-21 ENCOUNTER — Other Ambulatory Visit (HOSPITAL_COMMUNITY): Payer: Self-pay

## 2010-11-23 ENCOUNTER — Ambulatory Visit (HOSPITAL_COMMUNITY)
Admission: RE | Admit: 2010-11-23 | Discharge: 2010-11-23 | Disposition: A | Discharge status: Home / Self Care | Payer: Medicare Other | Source: Ambulatory Visit | Attending: Cardiology | Admitting: Cardiology

## 2010-11-23 ENCOUNTER — Encounter (HOSPITAL_COMMUNITY)
Admission: RE | Admit: 2010-11-23 | Discharge: 2010-11-23 | Disposition: A | Discharge status: Home / Self Care | Payer: Medicare Other | Source: Ambulatory Visit | Attending: Cardiology | Admitting: Cardiology

## 2010-11-23 DIAGNOSIS — R0602 Shortness of breath: Secondary | ICD-10-CM | POA: Insufficient documentation

## 2010-11-23 DIAGNOSIS — R109 Unspecified abdominal pain: Secondary | ICD-10-CM | POA: Insufficient documentation

## 2010-11-23 DIAGNOSIS — R002 Palpitations: Secondary | ICD-10-CM | POA: Insufficient documentation

## 2010-11-23 DIAGNOSIS — I10 Essential (primary) hypertension: Secondary | ICD-10-CM | POA: Insufficient documentation

## 2010-11-23 DIAGNOSIS — E119 Type 2 diabetes mellitus without complications: Secondary | ICD-10-CM | POA: Insufficient documentation

## 2010-11-23 LAB — LIPID PANEL
HDL: 65 mg/dL (ref >39)
Triglycerides: 81 mg/dL (ref <150)

## 2010-11-23 LAB — HEPATIC FUNCTION PANEL
Albumin: 3.4 g/dL — ABNORMAL LOW (ref 3.5–5.2)
Total Bilirubin: 0.3 mg/dL (ref 0.3–1.2)
Total Protein: 7.9 g/dL (ref 6.0–8.3)

## 2010-11-23 LAB — BASIC METABOLIC PANEL
Calcium: 10.1 mg/dL (ref 8.4–10.5)
GFR calc non Af Amer: 81 mL/min — ABNORMAL LOW (ref >90)
Sodium: 134 mEq/L — ABNORMAL LOW (ref 135–145)

## 2010-11-23 LAB — HEMOGLOBIN A1C
Hgb A1c MFr Bld: 10.7 % — ABNORMAL HIGH (ref <5.7)
Mean Plasma Glucose: 246 mg/dL — ABNORMAL HIGH (ref <117)
Mean Plasma Glucose: 260 mg/dL — ABNORMAL HIGH (ref <117)

## 2010-11-23 MED ORDER — TECHNETIUM TC 99M TETROFOSMIN IV KIT
30.0000 | PACK | Freq: Once | INTRAVENOUS | Status: AC | PRN
  Administered 2010-11-23: 30 via INTRAVENOUS

## 2010-11-23 MED ORDER — TECHNETIUM TC 99M TETROFOSMIN IV KIT
10.0000 | PACK | Freq: Once | INTRAVENOUS | Status: AC | PRN
  Administered 2010-11-23: 10 via INTRAVENOUS

## 2011-05-31 ENCOUNTER — Other Ambulatory Visit: Payer: Self-pay | Admitting: Emergency Medicine

## 2011-05-31 DIAGNOSIS — N644 Mastodynia: Secondary | ICD-10-CM

## 2011-06-05 ENCOUNTER — Ambulatory Visit
Admission: RE | Admit: 2011-06-05 | Discharge: 2011-06-05 | Disposition: A | Discharge status: Home / Self Care | Payer: Medicare Other | Source: Ambulatory Visit | Attending: Emergency Medicine | Admitting: Emergency Medicine

## 2011-06-05 ENCOUNTER — Other Ambulatory Visit: Payer: Self-pay | Admitting: Emergency Medicine

## 2011-06-05 DIAGNOSIS — N644 Mastodynia: Secondary | ICD-10-CM

## 2013-04-17 ENCOUNTER — Emergency Department (HOSPITAL_COMMUNITY): Payer: Medicare Other

## 2013-04-17 ENCOUNTER — Encounter (HOSPITAL_COMMUNITY): Payer: Self-pay | Admitting: Emergency Medicine

## 2013-04-17 ENCOUNTER — Emergency Department (HOSPITAL_COMMUNITY)
Admission: EM | Admit: 2013-04-17 | Discharge: 2013-04-18 | Disposition: A | Discharge status: Home / Self Care | Payer: Medicare Other | Attending: Emergency Medicine | Admitting: Emergency Medicine

## 2013-04-17 DIAGNOSIS — Z7982 Long term (current) use of aspirin: Secondary | ICD-10-CM | POA: Insufficient documentation

## 2013-04-17 DIAGNOSIS — Z8673 Personal history of transient ischemic attack (TIA), and cerebral infarction without residual deficits: Secondary | ICD-10-CM | POA: Insufficient documentation

## 2013-04-17 DIAGNOSIS — E119 Type 2 diabetes mellitus without complications: Secondary | ICD-10-CM | POA: Insufficient documentation

## 2013-04-17 DIAGNOSIS — Z872 Personal history of diseases of the skin and subcutaneous tissue: Secondary | ICD-10-CM | POA: Insufficient documentation

## 2013-04-17 DIAGNOSIS — R002 Palpitations: Secondary | ICD-10-CM | POA: Insufficient documentation

## 2013-04-17 DIAGNOSIS — I10 Essential (primary) hypertension: Secondary | ICD-10-CM | POA: Insufficient documentation

## 2013-04-17 DIAGNOSIS — Z7901 Long term (current) use of anticoagulants: Secondary | ICD-10-CM | POA: Insufficient documentation

## 2013-04-17 DIAGNOSIS — K219 Gastro-esophageal reflux disease without esophagitis: Secondary | ICD-10-CM | POA: Insufficient documentation

## 2013-04-17 DIAGNOSIS — Z794 Long term (current) use of insulin: Secondary | ICD-10-CM | POA: Insufficient documentation

## 2013-04-17 DIAGNOSIS — Z792 Long term (current) use of antibiotics: Secondary | ICD-10-CM | POA: Insufficient documentation

## 2013-04-17 DIAGNOSIS — Z880 Allergy status to penicillin: Secondary | ICD-10-CM | POA: Insufficient documentation

## 2013-04-17 DIAGNOSIS — Z79899 Other long term (current) drug therapy: Secondary | ICD-10-CM | POA: Insufficient documentation

## 2013-04-17 DIAGNOSIS — E669 Obesity, unspecified: Secondary | ICD-10-CM | POA: Insufficient documentation

## 2013-04-17 DIAGNOSIS — D509 Iron deficiency anemia, unspecified: Secondary | ICD-10-CM | POA: Insufficient documentation

## 2013-04-17 LAB — BASIC METABOLIC PANEL
BUN: 16 mg/dL (ref 6–23)
CALCIUM: 9.8 mg/dL (ref 8.4–10.5)
CHLORIDE: 95 meq/L — AB (ref 96–112)
CO2: 23 meq/L (ref 19–32)
Creatinine, Ser: 0.93 mg/dL (ref 0.50–1.10)
GFR calc Af Amer: 66 mL/min — ABNORMAL LOW (ref >90)
GFR calc non Af Amer: 57 mL/min — ABNORMAL LOW (ref >90)
GLUCOSE: 197 mg/dL — AB (ref 70–99)
Potassium: 6.2 mEq/L — ABNORMAL HIGH (ref 3.7–5.3)
Sodium: 132 mEq/L — ABNORMAL LOW (ref 137–147)

## 2013-04-17 LAB — CBC
HEMATOCRIT: 35.8 % — AB (ref 36.0–46.0)
Hemoglobin: 12.1 g/dL (ref 12.0–15.0)
MCH: 26.3 pg (ref 26.0–34.0)
MCHC: 33.8 g/dL (ref 30.0–36.0)
MCV: 77.8 fL — AB (ref 78.0–100.0)
PLATELETS: 452 10*3/uL — AB (ref 150–400)
RBC: 4.6 MIL/uL (ref 3.87–5.11)
RDW: 16.8 % — ABNORMAL HIGH (ref 11.5–15.5)
WBC: 9.6 10*3/uL (ref 4.0–10.5)

## 2013-04-17 LAB — PRO B NATRIURETIC PEPTIDE: PRO B NATRI PEPTIDE: 280 pg/mL (ref 0–450)

## 2013-04-17 LAB — POTASSIUM: POTASSIUM: 5.2 meq/L (ref 3.7–5.3)

## 2013-04-17 LAB — I-STAT TROPONIN, ED
Troponin i, poc: 0 ng/mL (ref 0.00–0.08)
Troponin i, poc: 0.01 ng/mL (ref 0.00–0.08)

## 2013-04-17 LAB — MAGNESIUM: MAGNESIUM: 1.8 mg/dL (ref 1.5–2.5)

## 2013-04-17 NOTE — ED Provider Notes (Signed)
CSN: [id number]     Arrival date & time 04/17/13  1809 History   First MD Initiated Contact with Patient 04/17/13 1833     Chief Complaint  Patient presents with  . Palpitations     (Consider location/radiation/quality/duration/timing/severity/associated sxs/prior Treatment) HPI Comments: Patient presents to the ER for evaluation of palpitations. Patient has been experiencing palpitations in her chest today. There is no associated chest pain. She has not had any shortness of breath, nausea, vomiting or diarrhea. Episodes only last briefly and then resolve.  Patient is a 80 y.o. female presenting with palpitations.  Palpitations   Past Medical History  Diagnosis Date  . DIABETES MELLITUS 03/01/2008  . ANEMIA, IRON DEFICIENCY 08/11/2008  . THROMBOCYTOSIS 08/18/2008  . HYPERTENSION 03/01/2008  . INTERNAL HEMORRHOIDS 03/01/2008  . GERD 03/01/2008  . POLYP, GALLBLADDER 03/07/2008  . CONTACT DERMATITIS&OTHER ECZEMA DUE UNSPEC CAUSE 11/28/2008  . KNEE PAIN, RIGHT 09/05/2009  . HYPERSOMNIA 05/30/2009  . PERIPHERAL EDEMA 08/10/2008  . SNORING 05/11/2009  . NONSPECIFIC ABN FINDNG RAD&OTH EXAM BILARY TRCT 03/07/2008  . ANGIOEDEMA 03/01/2008  . CEREBROVASCULAR ACCIDENT, HX OF 03/01/2008  . SMALL BOWEL OBSTRUCTION, HX OF 03/01/2008  . OBESITY 01/10/2010  . Dysuria 01/04/2010   Past Surgical History  Procedure Laterality Date  . Hernia repair  09/14/08    with release [facility]-Dr. Dalbert Batman  . Small intestine surgery    . 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 Diabetes Mother   . Heart disease Mother   . Diabetes Sister   . Heart disease Sister   . Colitis Sister   . Kidney disease Brother   . Breast cancer Other     Neice  . Allergies Other     Nephrew  . Asthma Other     Nephrew   History  Substance Use Topics  . Smoking status: Never Smoker   . Smokeless tobacco: Not on file     Comment:  single-divorced. 5 children  . Alcohol Use: No   OB History   Grav Para Term Preterm Abortions TAB SAB Ect Mult Living                 Review of Systems  Cardiovascular: Positive for palpitations.  All other systems reviewed and are negative.      Allergies  Ace inhibitors; Prednisone; Penicillins; and Sulfonamide derivatives  Home Medications   Current Outpatient Rx  Name  Route  Sig  Dispense  Refill  . aspirin 81 MG tablet   Oral   Take 81 mg by mouth every morning.          . Cholecalciferol (VITAMIN D) 2000 UNITS CAPS   Oral   Take 1 capsule by mouth every morning.         . cloNIDine (CATAPRES) 0.2 MG tablet   Oral   Take 0.2 mg by mouth 3 (three) times daily.          . Cranberry (SM CRANBERRY) 300 MG tablet   Oral   Take 300 mg by mouth 2 (two) times daily.         . cyanocobalamin 500 MCG tablet   Oral   Take 500 mcg by mouth every morning.         . diltiazem (CARDIZEM CD) 300 MG 24 hr capsule   Oral   Take 300 mg by mouth every morning.          Marland Kitchen  doxycycline (VIBRA-TABS) 100 MG tablet   Oral   Take 100 mg by mouth 2 (two) times daily as needed (boil).         Marland Kitchen esomeprazole (NEXIUM) 40 MG capsule   Oral   Take 40 mg by mouth daily before breakfast.           . Ferrous Sulfate (FE-CAPS) 250 MG CPCR   Oral   Take 1 capsule by mouth daily.          . insulin aspart (NOVOLOG) 100 UNIT/ML injection   Subcutaneous   Inject 0-7 Units into the skin 3 (three) times daily before meals. SSI: <150 = no insulin, 151-225 = 3 units, 226-300 = 4 units, 301-375 = 5 units, 376-450 = 6 units, >450 = 7 units         . insulin detemir (LEVEMIR) 100 UNIT/ML injection   Subcutaneous   Inject 20 Units into the skin at bedtime.         . metFORMIN (GLUCOPHAGE) 850 MG tablet   Oral   Take 850 mg by mouth 2 (two) times daily with a meal.         . Multiple Vitamin (MULTIVITAMIN) tablet   Oral   Take 1 tablet by mouth every morning.           . olmesartan (BENICAR) 40 MG tablet   Oral   Take 40 mg by mouth every morning.         . Probiotic Product (PROBIOTIC FORMULA PO)   Oral   Take 3 scoop by mouth every morning.          . Rivaroxaban (XARELTO) 20 MG TABS tablet   Oral   Take 20 mg by mouth daily with supper.          BP 137/73  Pulse 62  Temp(Src) 97.9 F (36.6 C)  Resp 12  SpO2 100% Physical Exam  Constitutional: She is oriented to person, place, and time. She appears well-developed and well-nourished. No distress.  HENT:  Head: Normocephalic and atraumatic.  Right Ear: Hearing normal.  Left Ear: Hearing normal.  Nose: Nose normal.  Mouth/Throat: Oropharynx is clear and moist and mucous membranes are normal.  Eyes: Conjunctivae and EOM are normal. Pupils are equal, round, and reactive to light.  Neck: Normal range of motion. Neck supple.  Cardiovascular: Regular rhythm, S1 normal and S2 normal.  Exam reveals no gallop and no friction rub.   No murmur heard. Pulmonary/Chest: Effort normal and breath sounds normal. No respiratory distress. She exhibits no tenderness.  Abdominal: Soft. Normal appearance and bowel sounds are normal. There is no hepatosplenomegaly. There is no tenderness. There is no rebound, no guarding, no tenderness at McBurney's point and negative Murphy's sign. No hernia.  Musculoskeletal: Normal range of motion.  Neurological: She is alert and oriented to person, place, and time. She has normal strength. No cranial nerve deficit or sensory deficit. Coordination normal. GCS eye subscore is 4. GCS verbal subscore is 5. GCS motor subscore is 6.  Skin: Skin is warm, dry and intact. No rash noted. No cyanosis.  Psychiatric: She has a normal mood and affect. Her speech is normal and behavior is normal. Thought content normal.    ED Course  Procedures (including critical care time) Labs Review Labs Reviewed  CBC - Abnormal; Notable for the following:    HCT 35.8 (*)    MCV 77.8  (*)    RDW 16.8 (*)    Platelets  452 (*)    All other components within normal limits  BASIC METABOLIC PANEL - Abnormal; Notable for the following:    Sodium 132 (*)    Potassium 6.2 (*)    Chloride 95 (*)    Glucose, Bld 197 (*)    GFR calc non Af Amer 57 (*)    GFR calc Af Amer 66 (*)    All other components within normal limits  PRO B NATRIURETIC PEPTIDE  POTASSIUM  MAGNESIUM  I-STAT TROPOININ, ED  Randolm Idol, ED   Imaging Review Dg Chest Port 1 View  04/17/2013   CLINICAL DATA:  Palpitations.  EXAM: PORTABLE CHEST - 1 VIEW  COMPARISON:  03/16/2008  FINDINGS: Cardiac silhouette is normal in size. Aorta is mildly uncoiled. No mediastinal or hilar masses. Clear lungs. No pleural effusion or pneumothorax.  Bony thorax is demineralized but grossly intact.  IMPRESSION: No acute cardiopulmonary disease.   Electronically Signed   By: Lajean Manes M.D.   On: 04/17/2013 19:11     EKG Interpretation   Date/Time:  Saturday April 17 2013 18:19:46 EDT Ventricular Rate:  53 PR Interval:  253 QRS Duration: 105 QT Interval:  441 QTC Calculation: 414 R Axis:   -66 Text Interpretation:  Sinus rhythm Prolonged PR interval Left anterior  fascicular block Abnormal R-wave progression, late transition Left  ventricular hypertrophy Nonspecific T abnormalities, diffuse leads  Confirmed by POLLINA  MD, CHRISTOPHER ([id number]) on 04/17/2013 9:09:28 PM      MDM   Final diagnoses:  Palpitations    Patient presents to the ER for evaluation of heart palpitations. Does have intermittent palpitations without associated shortness of breath or chest pain. She has not had any recurrence of symptoms here in the ER. Vital signs are normal and stable. Her EKG did have inverted T waves compared to previous EKGs. There are no ST segment changes and therefore this is nonspecific. Patient was observed for a protracted period of time without any further symptoms. Repeat troponin was negative. She does see  Doctor Terrence Dupont as her cardiologist. She was told to follow up with him Monday, return if her symptoms worsen.    Orpah Greek, MD 04/17/13 [phone]

## 2013-04-17 NOTE — ED Notes (Addendum)
Pt reports feeling like her heart is going too fast over last day and a half, increasing sob. Denies n/v, diaphoresis, extremity swelling. Sinus Bradycardia noted on monitor. Pt sts she is normally in 70s HR. Feels like her heart will beat out of her chest at times but not currently.

## 2013-04-17 NOTE — Discharge Instructions (Signed)
Palpitations  A palpitation is the feeling that your heartbeat is irregular. It may feel like your heart is fluttering or skipping a beat. It may also feel like your heart is beating faster than normal. This is usually not a serious problem. In some cases, you may need more medical tests. HOME CARE  Avoid:  Caffeine in coffee, tea, soft drinks, diet pills, and energy drinks.  Chocolate.  Alcohol.  Stop smoking if you smoke.  Reduce your stress and anxiety. Try:  A method that measures bodily functions so you can learn to control them (biofeedback).  Yoga.  Meditation.  Physical activity such as swimming, jogging, or walking.  Get plenty of rest and sleep. GET HELP RIGHT AWAY IF:   You have chest pain.  You feel short of breath.  You have a very bad headache.  You feel dizzy or pass out (faint).  Your fast or irregular heartbeat continues after 24 hours.  Your palpitations occur more often. MAKE SURE YOU:   Understand these instructions.  Will watch your condition.  Will get help right away if you are not doing well or get worse. Document Released: 10/17/2007 Document Revised: 07/09/2011 Document Reviewed: 03/08/2011 [employer] Patient Information 2014 [employer].  

## 2013-09-08 ENCOUNTER — Emergency Department (INDEPENDENT_AMBULATORY_CARE_PROVIDER_SITE_OTHER)
Admission: EM | Admit: 2013-09-08 | Discharge: 2013-09-08 | Disposition: A | Discharge status: Home / Self Care | Payer: Medicare PPO | Source: Home / Self Care | Attending: Emergency Medicine | Admitting: Emergency Medicine

## 2013-09-08 DIAGNOSIS — R1310 Dysphagia, unspecified: Secondary | ICD-10-CM

## 2013-09-08 DIAGNOSIS — J029 Acute pharyngitis, unspecified: Secondary | ICD-10-CM

## 2013-09-08 DIAGNOSIS — K209 Esophagitis, unspecified without bleeding: Secondary | ICD-10-CM

## 2013-09-08 LAB — POCT RAPID STREP A: STREPTOCOCCUS, GROUP A SCREEN (DIRECT): NEGATIVE

## 2013-09-08 MED ORDER — FLUCONAZOLE 100 MG PO TABS: 100.0000 mg | ORAL_TABLET | Freq: Every day | ORAL | Status: DC

## 2013-09-08 MED ORDER — FLUCONAZOLE 10 MG/ML PO SUSR: 100.0000 mg | Freq: Every day | ORAL | Status: DC

## 2013-09-08 MED ORDER — CLINDAMYCIN HCL 300 MG PO CAPS
300.0000 mg | ORAL_CAPSULE | Freq: Three times a day (TID) | ORAL | Status: DC
Start: 2013-09-08 — End: 2013-09-08

## 2013-09-08 MED ORDER — CLINDAMYCIN PALMITATE HCL 75 MG/5ML PO SOLR: 300.0000 mg | Freq: Three times a day (TID) | ORAL | Status: DC

## 2013-09-08 NOTE — ED Provider Notes (Signed)
CSN: [id number]     Arrival date & time 09/08/13  1253 History   First MD Initiated Contact with Patient 09/08/13 1420     Chief Complaint  Patient presents with  . Dysphagia   (Consider location/radiation/quality/duration/timing/severity/associated sxs/prior Treatment) HPI Comments: 80 year old female presents complaining of difficulty swallowing and yesterday. She notes that she was coughing some last night and subsequently developed difficulty swallowing. She describes this as pain in her throat with swallowing that radiates down into her chest. She has been unable to take any of her medications or eating any food, she has not used her insulin because she has not eaten food. No nausea or vomiting, no fever. No recent travel or sick contacts. Her blood sugar is generally well-controlled. She has no history of dysphagia   Past Medical History  Diagnosis Date  . DIABETES MELLITUS 03/01/2008  . ANEMIA, IRON DEFICIENCY 08/11/2008  . THROMBOCYTOSIS 08/18/2008  . HYPERTENSION 03/01/2008  . INTERNAL HEMORRHOIDS 03/01/2008  . GERD 03/01/2008  . POLYP, GALLBLADDER 03/07/2008  . CONTACT DERMATITIS&OTHER ECZEMA DUE UNSPEC CAUSE 11/28/2008  . KNEE PAIN, RIGHT 09/05/2009  . HYPERSOMNIA 05/30/2009  . PERIPHERAL EDEMA 08/10/2008  . SNORING 05/11/2009  . NONSPECIFIC ABN FINDNG RAD&OTH EXAM BILARY TRCT 03/07/2008  . ANGIOEDEMA 03/01/2008  . CEREBROVASCULAR ACCIDENT, HX OF 03/01/2008  . SMALL BOWEL OBSTRUCTION, HX OF 03/01/2008  . OBESITY 01/10/2010  . Dysuria 01/04/2010   Past Surgical History  Procedure Laterality Date  . Hernia repair  09/14/08    with release [facility]-Dr. Dalbert Batman  . Small intestine surgery    . 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 Diabetes Mother   . Heart disease Mother   . Diabetes Sister   . Heart disease Sister   . Colitis Sister   . Kidney disease Brother   . Breast cancer Other     Neice  .  Allergies Other     Nephrew  . Asthma Other     Nephrew   History  Substance Use Topics  . Smoking status: Never Smoker   . Smokeless tobacco: Not on file     Comment: single-divorced. 5 children  . Alcohol Use: No   OB History   Grav Para Term Preterm Abortions TAB SAB Ect Mult Living                 Review of Systems  HENT: Positive for sore throat.   Gastrointestinal:       Difficulty swallowing  All other systems reviewed and are negative.   Allergies  Ace inhibitors; Prednisone; Penicillins; and Sulfonamide derivatives  Home Medications   Prior to Admission medications   Medication Sig Start Date End Date Taking? Authorizing Provider  aspirin 81 MG tablet Take 81 mg by mouth every morning.     Historical Provider, MD  Cholecalciferol (VITAMIN D) 2000 UNITS CAPS Take 1 capsule by mouth every morning.    Historical Provider, MD  clindamycin (CLEOCIN) 300 MG capsule Take 1 capsule (300 mg total) by mouth 3 (three) times daily. 09/08/13   Liam Graham, PA-C  cloNIDine (CATAPRES) 0.2 MG tablet Take 0.2 mg by mouth 3 (three) times daily.     Historical Provider, MD  Cranberry (SM CRANBERRY) 300 MG tablet Take 300 mg by mouth 2 (two) times daily.    Historical Provider, MD  cyanocobalamin 500 MCG tablet Take 500 mcg  by mouth every morning.    Historical Provider, MD  diltiazem (CARDIZEM CD) 300 MG 24 hr capsule Take 300 mg by mouth every morning.     Historical Provider, MD  doxycycline (VIBRA-TABS) 100 MG tablet Take 100 mg by mouth 2 (two) times daily as needed (boil).    Historical Provider, MD  esomeprazole (NEXIUM) 40 MG capsule Take 40 mg by mouth daily before breakfast.      Historical Provider, MD  Ferrous Sulfate (FE-CAPS) 250 MG CPCR Take 1 capsule by mouth daily.     Historical Provider, MD  fluconazole (DIFLUCAN) 100 MG tablet Take 1 tablet (100 mg total) by mouth daily. 09/08/13   Freeman Caldron Bucky Grigg, PA-C  insulin aspart (NOVOLOG) 100 UNIT/ML injection Inject 0-7  Units into the skin 3 (three) times daily before meals. SSI: <150 = no insulin, 151-225 = 3 units, 226-300 = 4 units, 301-375 = 5 units, 376-450 = 6 units, >450 = 7 units    Historical Provider, MD  insulin detemir (LEVEMIR) 100 UNIT/ML injection Inject 20 Units into the skin at bedtime.    Historical Provider, MD  metFORMIN (GLUCOPHAGE) 850 MG tablet Take 850 mg by mouth 2 (two) times daily with a meal.    Historical Provider, MD  Multiple Vitamin (MULTIVITAMIN) tablet Take 1 tablet by mouth every morning.     Historical Provider, MD  olmesartan (BENICAR) 40 MG tablet Take 40 mg by mouth every morning.    Historical Provider, MD  Probiotic Product (PROBIOTIC FORMULA PO) Take 3 scoop by mouth every morning.     Historical Provider, MD  Rivaroxaban (XARELTO) 20 MG TABS tablet Take 20 mg by mouth daily with supper.    Historical Provider, MD   BP 164/102  Pulse 114  Temp(Src) 98.4 F (36.9 C) (Oral)  Resp 14  SpO2 100% Physical Exam  Nursing note and vitals reviewed. Constitutional: She is oriented to person, place, and time. Vital signs are normal. She appears well-developed and well-nourished. No distress.  HENT:  Head: Normocephalic and atraumatic.  Right Ear: Tympanic membrane, external ear and ear canal normal.  Left Ear: Tympanic membrane, external ear and ear canal normal.  Nose: Nose normal.  Mouth/Throat: Oropharyngeal exudate and posterior oropharyngeal erythema present.  A few small spots of exudate on the soft palate  Cardiovascular: Normal rate, regular rhythm and normal heart sounds.   Pulmonary/Chest: Effort normal and breath sounds normal. No respiratory distress.  Abdominal: Soft. Bowel sounds are normal. She exhibits no distension and no mass. There is no tenderness. There is no rebound and no guarding.  Neurological: She is alert and oriented to person, place, and time. She has normal strength. Coordination normal.  Skin: Skin is warm and dry. No rash noted. She is not  diaphoretic.  Psychiatric: She has a normal mood and affect. Judgment normal.    ED Course  Procedures (including critical care time) Labs Review Labs Reviewed  POCT RAPID STREP A ([facility] CARE ONLY)    Imaging Review No results found.   MDM   1. Esophagitis   2. Pharyngitis   3. Odynophagia    Will assume the spots on the soft palate indicative of oral thrush, she can therefore be assumed to have esophageal candidiasis as well. EKG checked and reveals sinus tach at 105 with LAD that is chronic.  The tachycardia can be explained by her infection. treating esophagitis. Followup ASAP with her gastroenterologist.  Meds ordered this encounter  Medications  . DISCONTD:  clindamycin (CLEOCIN) 300 MG capsule    Sig: Take 1 capsule (300 mg total) by mouth 3 (three) times daily.    Dispense:  21 capsule    Refill:  0    Order Specific Question:  Supervising Provider    Answer:  Jake Michaelis, DAVID C [id number]  . DISCONTD: fluconazole (DIFLUCAN) 100 MG tablet    Sig: Take 1 tablet (100 mg total) by mouth daily.    Dispense:  14 tablet    Refill:  0    Order Specific Question:  Supervising Provider    Answer:  Jake Michaelis, DAVID C [id number]  . clindamycin (CLEOCIN) 75 MG/5ML solution    Sig: Take 20 mLs (300 mg total) by mouth 3 (three) times daily.    Dispense:  420 mL    Refill:  0    Order Specific Question:  Supervising Provider    Answer:  Jake Michaelis, DAVID C [id number]  . fluconazole (DIFLUCAN) 10 MG/ML suspension    Sig: Take 10 mLs (100 mg total) by mouth daily.    Dispense:  150 mL    Refill:  0    Order Specific Question:  Supervising Provider    Answer:  Jake Michaelis, DAVID C [[id number]]       Liam Graham, PA-C 09/08/13 1625

## 2013-09-08 NOTE — ED Notes (Signed)
Patient reports difficulty swallowing since last night. Patient reports she has tried gargling with salt water with no relief. Patient is alert and oriented and in no acute distress. Patient reports she has not taken any of regular prescription Medications today due to dysphagia.

## 2013-09-08 NOTE — Discharge Instructions (Signed)
Esophagitis Esophagitis is inflammation of the esophagus. It can involve swelling, soreness, and pain in the esophagus. This condition can make it difficult and painful to swallow. CAUSES  Most causes of esophagitis are not serious. Many different factors can cause esophagitis, including:  Gastroesophageal reflux disease (GERD). This is when acid from your stomach flows up into the esophagus.  Recurrent vomiting.  An allergic-type reaction.  Certain medicines, especially those that come in large pills.  Ingestion of harmful chemicals, such as household cleaning products.  Heavy alcohol use.  An infection of the esophagus.  Radiation treatment for cancer.  Certain diseases such as sarcoidosis, Crohn's disease, and scleroderma. These diseases may cause recurrent esophagitis. SYMPTOMS   Trouble swallowing.  Painful swallowing.  Chest pain.  Difficulty breathing.  Nausea.  Vomiting.  Abdominal pain. DIAGNOSIS  Your caregiver will take your history and do a physical exam. Depending upon what your caregiver finds, certain tests may also be done, including:  Barium X-ray. You will drink a solution that coats the esophagus, and X-rays will be taken.  Endoscopy. A lighted tube is put down the esophagus so your caregiver can examine the area.  Allergy tests. These can sometimes be arranged through follow-up visits. TREATMENT  Treatment will depend on the cause of your esophagitis. In some cases, steroids or other medicines may be given to help relieve your symptoms or to treat the underlying cause of your condition. Medicines that may be recommended include:  Viscous lidocaine, to soothe the esophagus.  Antacids.  Acid reducers.  Proton pump inhibitors.  Antiviral medicines for certain viral infections of the esophagus.  Antifungal medicines for certain fungal infections of the esophagus.  Antibiotic medicines, depending on the cause of the esophagitis. HOME CARE  INSTRUCTIONS   Avoid foods and drinks that seem to make your symptoms worse.  Eat small, frequent meals instead of large meals.  Avoid eating for the 3 hours prior to your bedtime.  If you have trouble taking pills, use a pill splitter to decrease the size and likelihood of the pill getting stuck or injuring the esophagus on the way down. Drinking water after taking a pill also helps.  Stop smoking if you smoke.  Maintain a healthy weight.  Wear loose-fitting clothing. Do not wear anything tight around your waist that causes pressure on your stomach.  Raise the head of your bed 6 to 8 inches with wood blocks to help you sleep. Extra pillows will not help.  Only take over-the-counter or prescription medicines as directed by your caregiver. SEEK IMMEDIATE MEDICAL CARE IF:  You have severe chest pain that radiates into your arm, neck, or jaw.  You feel sweaty, dizzy, or lightheaded.  You have shortness of breath.  You vomit blood.  You have difficulty or pain with swallowing.  You have bloody or black, tarry stools.  You have a fever.  You have a burning sensation in the chest more than 3 times a week for more than 2 weeks.  You cannot swallow, drink, or eat.  You drool because you cannot swallow your saliva. MAKE SURE YOU:  Understand these instructions.  Will watch your condition.  Will get help right away if you are not doing well or get worse. Document Released: 02/15/2004 Document Revised: 04/01/2011 Document Reviewed: 09/07/2010 [employer] Patient Information 2015 [employer]. This information is not intended to replace advice given to you by your health care provider. Make sure you discuss any questions you have with your health care provider.  

## 2013-09-08 NOTE — ED Provider Notes (Signed)
Medical screening examination/treatment/procedure(s) were performed by non-physician practitioner and as supervising physician I was immediately available for consultation/collaboration.  Philipp Deputy, M.D.  Harden Mo, MD 09/08/13 2225

## 2013-09-10 LAB — CULTURE, GROUP A STREP

## 2014-04-03 ENCOUNTER — Encounter (HOSPITAL_COMMUNITY): Payer: Self-pay | Admitting: *Deleted

## 2014-04-03 ENCOUNTER — Emergency Department (INDEPENDENT_AMBULATORY_CARE_PROVIDER_SITE_OTHER)
Admission: EM | Admit: 2014-04-03 | Discharge: 2014-04-03 | Disposition: A | Discharge status: Home / Self Care | Payer: Medicare PPO | Source: Home / Self Care | Attending: Emergency Medicine | Admitting: Emergency Medicine

## 2014-04-03 DIAGNOSIS — L03115 Cellulitis of right lower limb: Secondary | ICD-10-CM

## 2014-04-03 MED ORDER — DOXYCYCLINE HYCLATE 100 MG PO TABS: 100.0000 mg | ORAL_TABLET | Freq: Two times a day (BID) | ORAL | Status: DC

## 2014-04-03 NOTE — ED Provider Notes (Signed)
CSN: [id number]     Arrival date & time 04/03/14  1520 History   First MD Initiated Contact with Patient 04/03/14 1614     Chief Complaint  Patient presents with  . Leg Swelling   (Consider location/radiation/quality/duration/timing/severity/associated sxs/prior Treatment) HPI She is a 81 year old woman here for evaluation of right leg swelling. She states this showed up about 3 days ago. She reports a hard tender area in her right inner thigh. She denies any known bug bite. No fevers or chills. No nausea or vomiting. The pain is worse with palpation. It is not particularly worse with walking.  Past Medical History  Diagnosis Date  . DIABETES MELLITUS 03/01/2008  . ANEMIA, IRON DEFICIENCY 08/11/2008  . THROMBOCYTOSIS 08/18/2008  . HYPERTENSION 03/01/2008  . INTERNAL HEMORRHOIDS 03/01/2008  . GERD 03/01/2008  . POLYP, GALLBLADDER 03/07/2008  . CONTACT DERMATITIS&OTHER ECZEMA DUE UNSPEC CAUSE 11/28/2008  . KNEE PAIN, RIGHT 09/05/2009  . HYPERSOMNIA 05/30/2009  . PERIPHERAL EDEMA 08/10/2008  . SNORING 05/11/2009  . NONSPECIFIC ABN FINDNG RAD&OTH EXAM BILARY TRCT 03/07/2008  . ANGIOEDEMA 03/01/2008  . CEREBROVASCULAR ACCIDENT, HX OF 03/01/2008  . SMALL BOWEL OBSTRUCTION, HX OF 03/01/2008  . OBESITY 01/10/2010  . Dysuria 01/04/2010   Past Surgical History  Procedure Laterality Date  . Hernia repair  09/14/08    with release [facility]-Dr. Dalbert Batman  . Small intestine surgery    . 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 Diabetes Mother   . Heart disease Mother   . Diabetes Sister   . Heart disease Sister   . Colitis Sister   . Kidney disease Brother   . Breast cancer Other     Neice  . Allergies Other     Nephrew  . Asthma Other     Nephrew   History  Substance Use Topics  . Smoking status: Never Smoker   . Smokeless tobacco: Not on file     Comment: single-divorced. 5 children  . Alcohol Use: No   OB  History    No data available     Review of Systems As in history of present illness Allergies  Ace inhibitors; Prednisone; Penicillins; and Sulfonamide derivatives  Home Medications   Prior to Admission medications   Medication Sig Start Date End Date Taking? Authorizing Provider  aspirin 81 MG tablet Take 81 mg by mouth every morning.     Historical Provider, MD  Cholecalciferol (VITAMIN D) 2000 UNITS CAPS Take 1 capsule by mouth every morning.    Historical Provider, MD  clindamycin (CLEOCIN) 75 MG/5ML solution Take 20 mLs (300 mg total) by mouth 3 (three) times daily. 09/08/13   Liam Graham, PA-C  cloNIDine (CATAPRES) 0.2 MG tablet Take 0.2 mg by mouth 3 (three) times daily.     Historical Provider, MD  Cranberry (SM CRANBERRY) 300 MG tablet Take 300 mg by mouth 2 (two) times daily.    Historical Provider, MD  cyanocobalamin 500 MCG tablet Take 500 mcg by mouth every morning.    Historical Provider, MD  diltiazem (CARDIZEM CD) 300 MG 24 hr capsule Take 300 mg by mouth every morning.     Historical Provider, MD  doxycycline (VIBRA-TABS) 100 MG tablet Take 1 tablet (100 mg total) by mouth 2 (two) times daily. 04/03/14   Melony Overly, MD  esomeprazole (NEXIUM) 40 MG capsule Take 40 mg by mouth daily  before breakfast.      Historical Provider, MD  Ferrous Sulfate (FE-CAPS) 250 MG CPCR Take 1 capsule by mouth daily.     Historical Provider, MD  fluconazole (DIFLUCAN) 10 MG/ML suspension Take 10 mLs (100 mg total) by mouth daily. 09/08/13   Freeman Caldron Baker, PA-C  insulin aspart (NOVOLOG) 100 UNIT/ML injection Inject 0-7 Units into the skin 3 (three) times daily before meals. SSI: <150 = no insulin, 151-225 = 3 units, 226-300 = 4 units, 301-375 = 5 units, 376-450 = 6 units, >450 = 7 units    Historical Provider, MD  insulin detemir (LEVEMIR) 100 UNIT/ML injection Inject 20 Units into the skin at bedtime.    Historical Provider, MD  metFORMIN (GLUCOPHAGE) 850 MG tablet Take 850 mg by mouth 2  (two) times daily with a meal.    Historical Provider, MD  Multiple Vitamin (MULTIVITAMIN) tablet Take 1 tablet by mouth every morning.     Historical Provider, MD  olmesartan (BENICAR) 40 MG tablet Take 40 mg by mouth every morning.    Historical Provider, MD  Probiotic Product (PROBIOTIC FORMULA PO) Take 3 scoop by mouth every morning.     Historical Provider, MD  Rivaroxaban (XARELTO) 20 MG TABS tablet Take 20 mg by mouth daily with supper.    Historical Provider, MD   BP 174/69 mmHg  Pulse 66  Temp(Src) 98.4 F (36.9 C) (Oral)  Resp 18  SpO2 97% Physical Exam  Constitutional: She is oriented to person, place, and time. She appears well-developed and well-nourished. No distress.  Cardiovascular: Normal rate.   Pulmonary/Chest: Effort normal.  Musculoskeletal: She exhibits no edema.  She has a 2 x 5 cm area of induration and tenderness in the right inner thigh. There is some dimpling of the skin over this area. No erythema.  Neurological: She is alert and oriented to person, place, and time.    ED Course  Procedures (including critical care time) Labs Review Labs Reviewed - No data to display  Imaging Review No results found.   MDM   1. Cellulitis of right lower extremity    Exam is most consistent with a cellulitis or developing boil. There are no signs of DVT. Will treat with doxycycline for 10 days. She will follow-up with her regular doctor on Wednesday for a recheck.    Melony Overly, MD 04/03/14 [phone]

## 2014-04-03 NOTE — ED Notes (Addendum)
Pt  Reports  Swelling   r  Leg       X  3  Days     denys  Any  specefic  Injury  denys  Any  Chest  Pain or  Shortness  Of  Breath     Sitting  Upright on the  Exam table  Speaking in  Complete  sentances

## 2014-04-03 NOTE — Discharge Instructions (Signed)
I think you have an infection in your skin. Take doxycycline 1 pill twice a day for 10 days. Please follow-up with your regular doctor on Wednesday for a recheck.

## 2014-06-20 ENCOUNTER — Emergency Department
Admission: EM | Admit: 2014-06-20 | Discharge: 2014-06-20 | Disposition: A | Discharge status: Home / Self Care | Payer: Medicare Other | Attending: Emergency Medicine | Admitting: Emergency Medicine

## 2014-06-20 ENCOUNTER — Emergency Department: Payer: Medicare Other

## 2014-06-20 DIAGNOSIS — Z7901 Long term (current) use of anticoagulants: Secondary | ICD-10-CM | POA: Insufficient documentation

## 2014-06-20 DIAGNOSIS — Z794 Long term (current) use of insulin: Secondary | ICD-10-CM | POA: Insufficient documentation

## 2014-06-20 DIAGNOSIS — E041 Nontoxic single thyroid nodule: Secondary | ICD-10-CM

## 2014-06-20 DIAGNOSIS — K8020 Calculus of gallbladder without cholecystitis without obstruction: Secondary | ICD-10-CM | POA: Insufficient documentation

## 2014-06-20 DIAGNOSIS — R9082 White matter disease, unspecified: Secondary | ICD-10-CM | POA: Insufficient documentation

## 2014-06-20 DIAGNOSIS — IMO0001 Reserved for inherently not codable concepts without codable children: Secondary | ICD-10-CM

## 2014-06-20 DIAGNOSIS — R059 Cough, unspecified: Secondary | ICD-10-CM

## 2014-06-20 DIAGNOSIS — R739 Hyperglycemia, unspecified: Secondary | ICD-10-CM

## 2014-06-20 DIAGNOSIS — R531 Weakness: Secondary | ICD-10-CM | POA: Insufficient documentation

## 2014-06-20 DIAGNOSIS — I10 Essential (primary) hypertension: Secondary | ICD-10-CM | POA: Insufficient documentation

## 2014-06-20 DIAGNOSIS — E1165 Type 2 diabetes mellitus with hyperglycemia: Secondary | ICD-10-CM | POA: Insufficient documentation

## 2014-06-20 DIAGNOSIS — R05 Cough: Secondary | ICD-10-CM

## 2014-06-20 HISTORY — DX: Type 2 diabetes mellitus without complications: E11.9

## 2014-06-20 HISTORY — DX: Essential (primary) hypertension: I10

## 2014-06-20 LAB — COMPREHENSIVE METABOLIC PANEL
ALT: 16 U/L (ref 0–55)
AST (SGOT): 15 U/L (ref 5–34)
Albumin/Globulin Ratio: 0.9 (ref 0.9–2.2)
Albumin: 3.6 g/dL (ref 3.5–5.0)
Alkaline Phosphatase: 90 U/L (ref 37–106)
Anion Gap: 12 (ref 5.0–15.0)
BUN: 13 mg/dL (ref 7–19)
Bilirubin, Total: 0.5 mg/dL (ref 0.2–1.2)
CO2: 23 mEq/L (ref 22–29)
Calcium: 9.5 mg/dL (ref 7.9–10.2)
Chloride: 103 mEq/L (ref 100–111)
Creatinine: 1 mg/dL (ref 0.6–1.0)
Globulin: 4.1 g/dL — ABNORMAL HIGH (ref 2.0–3.6)
Glucose: 346 mg/dL — ABNORMAL HIGH (ref 70–100)
Potassium: 4.3 mEq/L (ref 3.5–5.1)
Protein, Total: 7.7 g/dL (ref 6.0–8.3)
Sodium: 138 mEq/L (ref 136–145)

## 2014-06-20 LAB — CBC AND DIFFERENTIAL
Basophils Absolute Automated: 0.07 10*3/uL (ref 0.00–0.20)
Basophils Automated: 1 %
Eosinophils Absolute Automated: 0.24 10*3/uL (ref 0.00–0.70)
Eosinophils Automated: 3 %
Hematocrit: 39.9 % (ref 37.0–47.0)
Hgb: 13.5 g/dL (ref 12.0–16.0)
Lymphocytes Absolute Automated: 2.37 10*3/uL (ref 0.50–4.40)
Lymphocytes Automated: 26 %
MCH: 26.9 pg — ABNORMAL LOW (ref 28.0–32.0)
MCHC: 33.8 g/dL (ref 32.0–36.0)
MCV: 79.6 fL — ABNORMAL LOW (ref 80.0–100.0)
MPV: 9.8 fL (ref 9.4–12.3)
Monocytes Absolute Automated: 1.41 10*3/uL — ABNORMAL HIGH (ref 0.00–1.20)
Monocytes: 16 %
Neutrophils Absolute: 5.03 10*3/uL (ref 1.80–8.10)
Neutrophils: 55 %
Platelets: 426 10*3/uL — ABNORMAL HIGH (ref 140–400)
RBC: 5.01 10*6/uL (ref 4.20–5.40)
RDW: 18 % — ABNORMAL HIGH (ref 12–15)
WBC: 9.12 10*3/uL (ref 3.50–10.80)

## 2014-06-20 LAB — URINALYSIS, REFLEX TO MICROSCOPIC EXAM IF INDICATED
Bilirubin, UA: NEGATIVE
Blood, UA: NEGATIVE
Glucose, UA: >=500 — AB
Ketones UA: 5 — AB
Nitrite, UA: NEGATIVE
Protein, UR: 100 — AB
Specific Gravity UA: 1.02 (ref 1.001–1.035)
Urine pH: 6 (ref 5.0–8.0)
Urobilinogen, UA: NEGATIVE mg/dL

## 2014-06-20 LAB — GLUCOSE WHOLE BLOOD - POCT: Whole Blood Glucose POCT: 238 mg/dL — ABNORMAL HIGH (ref 70–100)

## 2014-06-20 LAB — TROPONIN I: Troponin I: 0.02 ng/mL (ref 0.00–0.09)

## 2014-06-20 LAB — GFR: EGFR: >60

## 2014-06-20 MED ORDER — INSULIN REGULAR HUMAN 100 UNIT/ML IJ SOLN
5.0000 [IU] | Freq: Once | INTRAMUSCULAR | Status: AC
Start: 2014-06-20 — End: 2014-06-20
  Administered 2014-06-20: 5 [IU] via INTRAVENOUS
  Filled 2014-06-20: qty 15

## 2014-06-20 MED ORDER — IOHEXOL 350 MG/ML IV SOLN
INTRAVENOUS | Status: AC
Start: 2014-06-20 — End: 2014-06-20
  Administered 2014-06-20: 100 mL via INTRAVENOUS
  Filled 2014-06-20: qty 100

## 2014-06-20 MED ORDER — BENZONATATE 100 MG PO CAPS
100.0000 mg | ORAL_CAPSULE | Freq: Three times a day (TID) | ORAL | Status: DC | PRN
Start: 2014-06-20 — End: 2017-06-05

## 2014-06-20 MED ORDER — CLONIDINE HCL 0.1 MG PO TABS
0.1000 mg | ORAL_TABLET | Freq: Once | ORAL | Status: AC
Start: 2014-06-20 — End: 2014-06-20
  Administered 2014-06-20: 0.1 mg via ORAL
  Filled 2014-06-20: qty 1

## 2014-06-20 MED ORDER — ALBUTEROL SULFATE HFA 108 (90 BASE) MCG/ACT IN AERS
2.0000 | INHALATION_SPRAY | RESPIRATORY_TRACT | Status: AC | PRN
Start: 2014-06-20 — End: 2014-07-20

## 2014-06-20 NOTE — ED Notes (Signed)
Patient took her diltiazem, clonidine, and metformin at 1400.  BP is still elevated.

## 2014-06-20 NOTE — ED Provider Notes (Signed)
Hx as detailed in Dr. Thad Ranger note. In short, pt with cough x ~one week. Today not feeling well. No reports of CP/SOB/back pain/vomiting/fever/chills. Pt with improvement of BP. Pt currently on xarelto. Doubt ICH as etiology of not feeling well. Discussed risk/benefit for CT, pt;s family however would feel more comfortable with CT. Pt also with hx of PE in the past, family over the phone who is an RN report that her presentation was atypical, but was large PE. Will add CT-A    CT head: no acute findings  CT-A chest: no obvious PE. Incidental thyroid nodule    Pt resting comfortably in exam room. Occasional dry cough at times in exam room. Will give albuterol/tessalon perles PRN. Risk/benefit for abx likely not entirely favorable at this time. Pt's family member at bedside, pt will closely monitor symptoms. Will return immediately if any worsening symptoms. Strict return instructions discussed and provided    Sharon Seller, MD  06/20/14 2348

## 2014-06-20 NOTE — ED Provider Notes (Signed)
EMERGENCY DEPARTMENT NOTE    Physician/Midlevel provider first contact with patient: 06/20/14 1426         HISTORY OF PRESENT ILLNESS   Historian: patient  Translator Used: no    81 y.o. female presents with malaise, hypertension, hyperglycemia.    1. Location of symptoms: see above  2. Onset of symptoms: this morning  3. What was patient doing when symptoms started (Context): rode from [employer] yesterday, got in today at 2 am, poor sleep as result  4. Severity: mild  5. Timing: constant  6. Activities that worsen symptoms: none  7. Activities that improve symptoms: none  8. Quality: malaise, htn, hyperglycemia   9. Radiation of symptoms: none  10. Associated signs and Symptoms: nausea which resolved with eating soup and crackers  11. Are symptoms worsening? yes    MEDICAL HISTORY     Past Medical History:  Past Medical History   Diagnosis Date   . Hypertension    . Diabetes mellitus        Past Surgical History:  History reviewed. No pertinent past surgical history.    Social History:  History     Social History   . Marital Status: Divorced     Spouse Name: N/A   . Number of Children: N/A   . Years of Education: N/A     Occupational History   . Not on file.     Social History Main Topics   . Smoking status: Never Smoker    . Smokeless tobacco: Not on file   . Alcohol Use: No   . Drug Use: Not on file   . Sexual Activity: Not on file     Other Topics Concern   . Not on file     Social History Narrative   . No narrative on file       Family History:  History reviewed. No pertinent family history.    Outpatient Medication:  Discharge Medication List as of 06/20/2014  8:47 PM      CONTINUE these medications which have NOT CHANGED    Details   Cholecalciferol (VITAMIN D) 1000 UNIT tablet Take 1,000 Units by mouth daily., Until Discontinued, Historical Med      cloNIDine (CATAPRES) 0.2 MG tablet Take 0.2 mg by mouth 2 (two) times daily., Until Discontinued, Historical Med      Diltiazem HCl ER Beads (TIAZAC PO)  Take by mouth., Until Discontinued, Historical Med      esomeprazole (NEXIUM) 40 MG capsule Take 40 mg by mouth every morning before breakfast., Until Discontinued, Historical Med      ferrous sulfate 325 (65 FE) MG tablet Take 325 mg by mouth every morning with breakfast., Until Discontinued, Historical Med      insulin aspart (NOVOLOG 70/30) (70-30) 100 UNIT/ML injection Inject into the skin 2 (two) times daily before meals., Until Discontinued, Historical Med      insulin detemir (LEVEMIR) 100 UNIT/ML injection Inject into the skin., Until Discontinued, Historical Med      metFORMIN (GLUCOPHAGE) 850 MG tablet Take 850 mg by mouth 2 (two) times daily with meals., Until Discontinued, Historical Med      Olmesartan Medoxomil (BENICAR PO) Take by mouth., Until Discontinued, Historical Med      Rivaroxaban (XARELTO PO) Take by mouth., Until Discontinued, Historical Med      vitamin B-12 (CYANOCOBALAMIN) 500 MCG tablet Take 500 mcg by mouth daily., Until Discontinued, Historical Med  Allergies:  No Known Allergies      REVIEW OF SYSTEMS   Review of Systems   Constitutional: Positive for malaise/fatigue. Negative for fever and chills.   HENT: Negative for congestion and sore throat.    Respiratory: Positive for cough. Negative for shortness of breath.    Cardiovascular: Negative for chest pain.   Gastrointestinal: Negative for vomiting, abdominal pain and diarrhea.        Nausea now resolved   Neurological: Positive for weakness. Negative for dizziness, loss of consciousness and headaches.   All other systems reviewed and are negative.        PHYSICAL EXAM     Filed Vitals:    06/20/14 1941   BP: 185/80   Pulse: 78   Temp:    Resp: 12   SpO2: 99%       Nursing note and vitals reviewed.    Constitutional: non-toxic  Head: Atraumatic.  Eyes: PERRL. EOMI. No scleral icterus.  ENT: Mucous membranes are moist and intact. Oropharynx is clear. Patent airway.  Neck: Supple. No cervical  lymphadenopathy.  Cardiovascular: Regular rate. Regular rhythm. No murmurs, rubs, or gallops.  Pulmonary/Chest: No evidence of respiratory distress. Clear to auscultation bilaterally. No wheezing, rales or rhonchi.   GI: Soft, non-distended abdomen. No tenderness to palpation of abdomen.  Extremities: No edema. No deformity.  Skin: No rash.   Neurological: Awake, alert and oriented x 3. CN II-XII intact. Strength intact. Sensation intact.  Psychiatric: Appropriate affect. Appropriate mood. Appropriate behavior.    MEDICAL DECISION MAKING   Malaise.  Unclear etiology.  Could be due to poor sleep due to travel and arrival here 2 AM.  Patient is hypertensive and has hyperglycemia here today.  She has a history of hypertension and takes multiple medications.  She also has a history of diabetes and takes insulin.  Complete blood count is unremarkable.  Metabolic panel noted for hyperglycemia.  Insulin given.  Hyperglycemia improved.  No evidence of DKA.  EKG and troponin obtained to rule out ACS.  I do not believe patient has ACS.  Urinalysis obtained to rule out urinary tract infection.  This is not concerning for urinary tract infection.  Hypertension was managed with clonidine in the ER.  Obtaining reliable blood pressure from patient very difficult given her very large arms.  I signed patient out to Dr. Crissie Sickles prior to urinalysis returning.  The plan was for Dr. Crissie Sickles to check urinalysis, recheck blood pressure.  Please refer to his note for additional documentation regarding patient's ER visit.    DISCUSSION      Vital Signs: Reviewed the patient?s vital signs.   Nursing Notes: Reviewed and utilized available nursing notes.  Medical Records Reviewed: Reviewed available past medical records.  Counseling: The emergency provider has spoken with the patient and discussed today?s findings, in addition to providing specific details for the plan of care.  Questions are answered and there is agreement with the plan.    IMAGING  STUDIES    The following imaging studies were independently interpreted by the Emergency Medicine Physician.  For full imaging study results please see chart.    CARDIAC STUDIES     The following cardiac studies were independently interpreted by the Emergency Medicine Physician. For full cardiac study results please see chart     EKG Interpretation:   Signed and interpreted by ED Physician   Time Interpreted: 1450  Comparison:   Rate: 120  Rhythm: sinus tachycardia  Axis:  Intervals:   Blocks:   ST segments: poor r wave progression  Interpretation: abnormal EKG    PULSE OXIMETRY        EMERGENCY DEPT. MEDICATIONS      ED Medication Orders     Start Ordered     Status Ordering Provider    06/20/14 1909 06/20/14 1909  iohexol (OMNIPAQUE) 350 MG/ML injection     Comments:  Created by cabinet override    Last MAR action:  Given     06/20/14 1541 06/20/14 1540  cloNIDine (CATAPRES) tablet 0.1 mg   Once     Route: Oral  Ordered Dose: 0.1 mg     Last MAR action:  Given [facility]    06/20/14 1537 06/20/14 1536  insulin regular (HumuLIN R,NovoLIN R) injection 5 Units   Once     Route: Intravenous  Ordered Dose: 5 Units     Last MAR action:  Given Joscelin Fray [facility]          LABORATORY RESULTS    Ordered and independently interpreted AVAILABLE laboratory tests. Please see results section in chart for full details.  Results for orders placed or performed during the hospital encounter of 06/20/14   Comprehensive metabolic panel   Result Value Ref Range    Glucose 346 (H) 70 - 100 mg/dL    BUN 13 7 - 19 mg/dL    Creatinine 1.0 0.6 - 1.0 mg/dL    Sodium 782 956 - 213 mEq/L    Potassium 4.3 3.5 - 5.1 mEq/L    Chloride 103 100 - 111 mEq/L    CO2 23 22 - 29 mEq/L    Calcium 9.5 7.9 - 10.2 mg/dL    Protein, Total 7.7 6.0 - 8.3 g/dL    Albumin 3.6 3.5 - 5.0 g/dL    AST (SGOT) 15 5 - 34 U/L    ALT 16 0 - 55 U/L    Alkaline Phosphatase 90 37 - 106 U/L    Bilirubin, Total 0.5 0.2 - 1.2 mg/dL    Globulin 4.1 (H) 2.0  - 3.6 g/dL    Albumin/Globulin Ratio 0.9 0.9 - 2.2    Anion Gap 12.0 5.0 - 15.0   CBC with differential   Result Value Ref Range    WBC 9.12 3.50 - 10.80 x10 3/uL    Hgb 13.5 12.0 - 16.0 g/dL    Hematocrit 08.6 57.8 - 47.0 %    Platelets 426 (H) 140 - 400 x10 3/uL    RBC 5.01 4.20 - 5.40 x10 6/uL    MCV 79.6 (L) 80.0 - 100.0 fL    MCH 26.9 (L) 28.0 - 32.0 pg    MCHC 33.8 32.0 - 36.0 g/dL    RDW 18 (H) 12 - 15 %    MPV 9.8 9.4 - 12.3 fL    Neutrophils 55 None %    Lymphocytes Automated 26 None %    Monocytes 16 None %    Eosinophils Automated 3 None %    Basophils Automated 1 None %    Neutrophils Absolute 5.03 1.80 - 8.10 x10 3/uL    Abs Lymph Automated 2.37 0.50 - 4.40 x10 3/uL    Abs Mono Automated 1.41 (H) 0.00 - 1.20 x10 3/uL    Abs Eos Automated 0.24 0.00 - 0.70 x10 3/uL    Absolute Baso Automated 0.07 0.00 - 0.20 x10 3/uL   Troponin I   Result Value Ref Range    Troponin I  0.02 0.00 - 0.09 ng/mL   GFR   Result Value Ref Range    EGFR >60.0    UA, Reflex to Microscopic   Result Value Ref Range    Urine Type Clean Catch     Color, UA Yellow Clear - Yellow    Clarity, UA Sl Cloudy (A) Clear - Hazy    Specific Gravity UA 1.020 1.001-1.035    Urine pH 6.0 5.0-8.0    Leukocyte Esterase, UA Trace (A) Negative    Nitrite, UA Negative Negative    Protein, UR 100 (A) Negative    Glucose, UA >=500 (A) Negative    Ketones UA 5 (A) Negative    Urobilinogen, UA Negative 0.2  -  2.0 mg/dL    Bilirubin, UA Negative Negative    Blood, UA Negative Negative    RBC, UA 0 - 5 0 - 5 /hpf    WBC, UA 0 - 5 0 - 5 /hpf    Squamous Epithelial Cells, Urine 0 - 5 0 - 25 /hpf    Trans Epithel, UA 0 - 2 0 - 2 /hpf    Hyaline Casts, UA 0 - 3 0 - 5 /lpf   Glucose Whole Blood - POCT   Result Value Ref Range    POCT - Glucose Whole blood 238 (H) 70 - 100 mg/dL   ECG 12 lead   Result Value Ref Range    Ventricular Rate 120 BPM    Atrial Rate 120 BPM    P-R Interval 208 ms    QRS Duration 98 ms    Q-T Interval 308 ms    QTC Calculation (Bezet)  435 ms    P Axis -11 degrees    R Axis -67 degrees    T Axis 70 degrees       CONSULTATIONS        CRITICAL CARE        ATTESTATIONS        Physician Attestation: Darlyn Read MD, have been the primary provider for Cordie Grice during this Emergency Dept visit and have reviewed the chart for accuracy and agree with its content.       DIAGNOSIS      Diagnosis:  Final diagnoses:   Cough   Weakness   Elevated BP   Hyperglycemia   Thyroid nodule       Disposition:  ED Disposition     Discharge Dawnell Bryant Casso discharge to home/self care.    Condition at disposition: Stable            Prescriptions:  Discharge Medication List as of 06/20/2014  8:47 PM      START taking these medications    Details   albuterol (PROVENTIL HFA;VENTOLIN HFA) 108 (90 BASE) MCG/ACT inhaler Inhale 2 puffs into the lungs every 4 (four) hours as needed for Wheezing or Shortness of Breath (coughing). Dispense with spacer, Starting 06/20/2014, Until Wed 07/20/14, Print      benzonatate (TESSALON PERLES) 100 MG capsule Take 1 capsule (100 mg total) by mouth 3 (three) times daily as needed for Cough., Starting 06/20/2014, Until Discontinued, Print         CONTINUE these medications which have NOT CHANGED    Details   Cholecalciferol (VITAMIN D) 1000 UNIT tablet Take 1,000 Units by mouth daily., Until Discontinued, Historical Med      cloNIDine (CATAPRES) 0.2 MG tablet Take 0.2 mg by mouth 2 (two) times daily., Until Discontinued, Historical Med  Diltiazem HCl ER Beads (TIAZAC PO) Take by mouth., Until Discontinued, Historical Med      esomeprazole (NEXIUM) 40 MG capsule Take 40 mg by mouth every morning before breakfast., Until Discontinued, Historical Med      ferrous sulfate 325 (65 FE) MG tablet Take 325 mg by mouth every morning with breakfast., Until Discontinued, Historical Med      insulin aspart (NOVOLOG 70/30) (70-30) 100 UNIT/ML injection Inject into the skin 2 (two) times daily before meals., Until Discontinued, Historical Med       insulin detemir (LEVEMIR) 100 UNIT/ML injection Inject into the skin., Until Discontinued, Historical Med      metFORMIN (GLUCOPHAGE) 850 MG tablet Take 850 mg by mouth 2 (two) times daily with meals., Until Discontinued, Historical Med      Olmesartan Medoxomil (BENICAR PO) Take by mouth., Until Discontinued, Historical Med      Rivaroxaban (XARELTO PO) Take by mouth., Until Discontinued, Historical Med      vitamin B-12 (CYANOCOBALAMIN) 500 MCG tablet Take 500 mcg by mouth daily., Until Discontinued, Historical Med                 Marland Mcalpine, MD  06/21/14 [phone]

## 2014-06-20 NOTE — ED Notes (Signed)
Pt sent from [facility] for hypertension. Patient states she just isnt feeling well

## 2014-06-20 NOTE — Discharge Instructions (Signed)
Please continue to closely monitor your symptoms  Please make sure to take all of your medications as prescribed  Take albuterol/tessalon perles as needed for coughing  Return to the ER if worsening chest pain/difficulty breathing/dizziness/lightheadedness/weakness/fever/chills/nausea/vomiting or any other worsening concerns  Please follow up with your primary care physician or [place] [facility]  Please follow up for the thyroid nodule. You will need an ultrasound    Thank you for allowing [place] to address your concerns. Hope you feel better soon!  Sharon Seller M.D.  Emergency Medicine    Hypertension    You have been diagnosed with elevated blood pressure.    The medical term for high blood pressure is hypertension. Many people feel anxious or uncomfortable about being at the hospital. If you feel anxious today, this could make your blood pressure appear high, even if your blood pressure is usually normal. Check your blood pressure several more times when you are not feeling stress. Keep a record of these readings and give this information to your regular doctor. He or she will decide whether you have hypertension that requires medical treatment.    If your blood pressure becomes extremely high all of a sudden, you will probably notice symptoms. In fact, very high blood pressure is a medical emergency. Most people with hypertension have blood pressure that is only a little too high. Mild high blood pressure does not cause specific symptoms. Instead, the effects of hypertension develop slowly over time. Untreated hypertension can affect the heart, brain, kidneys, eyes, and blood vessels. Unfortunately, by the time side-effects become noticeable, the body has already been damaged. This is why hypertension is called "the silent killer!"    It is important to follow up with your regular doctor. Check your blood pressure several times in the next 1 to 2 weeks and tell your doctor about the results. It may be  helpful to keep a log or a journal where you can write down your blood pressures. Note the time of day and the activity you were doing when the reading was taken.    YOU SHOULD SEEK MEDICAL ATTENTION IMMEDIATELY, EITHER HERE OR AT THE NEAREST EMERGENCY DEPARTMENT, IF ANY OF THE FOLLOWING OCCURS:   You have a headache.   You have chest pain.   You are short of breath or have trouble breathing.    You feel weak, especially on only one side of the body.   Your symptoms get worse or you have other concerns.       Hyperglycemia    During the visit today, your blood sugar was found to be high.    The medical term for high blood sugar is hyperglycemia. This may be a one-time event, but it could mean you have diabetes. Untreated diabetes can lead to heart problems and kidney problems (including kidney failure). It can also lead to stroke and blindness. It is very important to follow up with your regular doctor to have your blood sugar re-checked.    Tell your regular doctor that your blood sugar was high today. Your doctor will want to recheck your blood. He or she may also want to order other lab tests. If the doctor finds that you have diabetes, you will need medicine and a special diet to control your blood sugar.    YOU SHOULD SEEK MEDICAL ATTENTION IMMEDIATELY, EITHER HERE OR AT THE NEAREST EMERGENCY DEPARTMENT, IF ANY OF THE FOLLOWING OCCURS:   Confusion or lethargy (very sleepy and hard to wake up).  Signs of dehydration. Signs include less urination (peeing), dry mouth, extreme fatigue (tiredness), lightheadedness or fainting.   Constant vomiting (throwing up).   Fever (temperature higher than 100.71F / 38C) or shaking chills.   Abdominal (belly) pain or vomiting (throwing up).

## 2014-06-20 NOTE — ED Notes (Signed)
Bed: M 17  Expected date:   Expected time:   Means of arrival:   Comments:

## 2014-06-20 NOTE — ED Notes (Signed)
Patient states she is medication compliant but according to her daughter the patient is not taking her medications regularly.

## 2014-06-20 NOTE — Progress Notes (Signed)
CM introduced self to patient/family.  Gave patient/family contact information for ED CM.  Pt does not present with any discharge needs at this time.  Pt not from this area but is staying with a daughter who lives locally; pt has received outpt care at [facility] in [place], [place].

## 2014-06-21 LAB — ECG 12-LEAD
Atrial Rate: 120 {beats}/min
P Axis: -11 degrees
P-R Interval: 208 ms
Q-T Interval: 308 ms
QRS Duration: 98 ms
QTC Calculation (Bezet): 435 ms
R Axis: -67 degrees
T Axis: 70 degrees
Ventricular Rate: 120 {beats}/min

## 2014-06-22 ENCOUNTER — Encounter: Payer: Self-pay | Admitting: Internal Medicine

## 2014-06-29 ENCOUNTER — Emergency Department
Admission: EM | Admit: 2014-06-29 | Discharge: 2014-06-29 | Disposition: A | Discharge status: Home / Self Care | Payer: Medicare Other | Attending: Emergency Medical Services | Admitting: Emergency Medical Services

## 2014-06-29 ENCOUNTER — Emergency Department: Payer: Medicare Other

## 2014-06-29 DIAGNOSIS — R059 Cough, unspecified: Secondary | ICD-10-CM

## 2014-06-29 DIAGNOSIS — I10 Essential (primary) hypertension: Secondary | ICD-10-CM | POA: Insufficient documentation

## 2014-06-29 DIAGNOSIS — E119 Type 2 diabetes mellitus without complications: Secondary | ICD-10-CM | POA: Insufficient documentation

## 2014-06-29 DIAGNOSIS — R05 Cough: Secondary | ICD-10-CM

## 2014-06-29 DIAGNOSIS — Z794 Long term (current) use of insulin: Secondary | ICD-10-CM | POA: Insufficient documentation

## 2014-06-29 HISTORY — DX: Angioneurotic edema, initial encounter: T78.3XXA

## 2014-06-29 MED ORDER — LEVOFLOXACIN 250 MG PO TABS
250.0000 mg | ORAL_TABLET | Freq: Every day | ORAL | Status: AC
Start: 2014-06-29 — End: 2014-07-06

## 2014-06-29 NOTE — ED Notes (Signed)
Pt stated was seen here in ed for cough on 06/20/14.  Pt still coughing and came back to ed for evaluation.

## 2014-06-29 NOTE — Discharge Instructions (Signed)
.  Please take antibiotics with food.   If you develop diarrhea, or rash, stop antibiotic. Call PMD or ED for further instructions.   Return if worsening symptoms.       Cough    You have been seen for your cough.    There are many possible causes of cough. Most are not dangerous. Your doctor has determined that it is OK for you to go home today.    Your doctor believes your cough was caused by bacteria. Your doctor prescribed an antibiotic that will fight the bacteria.    The doctor may have prescribed some medicine to help with your cough. Use the medicine as directed.    YOU SHOULD SEEK MEDICAL ATTENTION IMMEDIATELY, EITHER HERE OR AT THE NEAREST EMERGENCY DEPARTMENT, IF ANY OF THE FOLLOWING OCCURS:   You wheeze or have trouble breathing.   You cough up mucous or lose weight for no reason.   You have a fever (temperature higher than 100.4F / 38C) that lasts more than 5 days.   You have chest pain.   Your symptoms get worse or do not get better in 2 or 3 days.   You have any new problems or concerns.

## 2014-06-29 NOTE — ED Provider Notes (Signed)
Physician/Midlevel provider first contact with patient: 06/29/14 1256         EMERGENCY DEPARTMENT NOTE    Physician/Midlevel provider first contact with patient: 06/29/14 1256         HISTORY OF PRESENT ILLNESS   Historian:Patient  Translator Used: No    81 y.o. female     1. Location of symptoms:chest  2. Onset of symptoms: a few wks  3. What was patient doing when symptoms started (Context): nothing  4. Severity: moderate  5. Timing: constant  6. Activities that worsen symptoms: none  7. Activities that improve symptoms: none  8. Quality:   9. Radiation of symptoms:  10. Associated signs and Symptoms: cough, not getting better with previously given tx. No fever. No n/v/d. Mild cp with coughing  11. Are symptoms worsening?NO  MEDICAL HISTORY     Past Medical History:  Past Medical History   Diagnosis Date   . Hypertension    . Diabetes mellitus    . Angioedema        Past Surgical History:  History reviewed. No pertinent past surgical history.    Social History:  History     Social History   . Marital Status: Divorced     Spouse Name: N/A   . Number of Children: N/A   . Years of Education: N/A     Occupational History   . Not on file.     Social History Main Topics   . Smoking status: Never Smoker    . Smokeless tobacco: Not on file   . Alcohol Use: No   . Drug Use: Not on file   . Sexual Activity: Not on file     Other Topics Concern   . Not on file     Social History Narrative       Family History:  History reviewed. No pertinent family history.    Outpatient Medication:  Previous Medications    ALBUTEROL (PROVENTIL HFA;VENTOLIN HFA) 108 (90 BASE) MCG/ACT INHALER    Inhale 2 puffs into the lungs every 4 (four) hours as needed for Wheezing or Shortness of Breath (coughing). Dispense with spacer    BENZONATATE (TESSALON PERLES) 100 MG CAPSULE    Take 1 capsule (100 mg total) by mouth 3 (three) times daily as needed for Cough.    CHOLECALCIFEROL (VITAMIN D) 1000 UNIT TABLET    Take 1,000 Units by mouth daily.     CLONIDINE (CATAPRES) 0.2 MG TABLET    Take 0.2 mg by mouth 2 (two) times daily.    DILTIAZEM HCL ER BEADS (TIAZAC PO)    Take by mouth.    ESOMEPRAZOLE (NEXIUM) 40 MG CAPSULE    Take 40 mg by mouth every morning before breakfast.    FERROUS SULFATE 325 (65 FE) MG TABLET    Take 325 mg by mouth every morning with breakfast.    INSULIN ASPART (NOVOLOG 70/30) (70-30) 100 UNIT/ML INJECTION    Inject into the skin 2 (two) times daily before meals.    INSULIN DETEMIR (LEVEMIR) 100 UNIT/ML INJECTION    Inject into the skin.    METFORMIN (GLUCOPHAGE) 850 MG TABLET    Take 850 mg by mouth 2 (two) times daily with meals.    OLMESARTAN MEDOXOMIL (BENICAR PO)    Take by mouth.    RIVAROXABAN (XARELTO PO)    Take by mouth.    VITAMIN B-12 (CYANOCOBALAMIN) 500 MCG TABLET    Take 500 mcg by mouth daily.  REVIEW OF SYSTEMS   ADD ROS  Review of Systems   Respiratory: Positive for cough.    All other systems reviewed and are negative.          PHYSICAL EXAM     Filed Vitals:    06/29/14    BP: 202/109   Pulse: 90   Temp: 97.9 F   Resp: 18   SpO2: 97% RA       Nursing note and vitals reviewed.  Constitutional:  Well developed, well nourished.   Head:  Atraumatic. Normocephalic.    Eyes:  PERRL. EOMI. Conjunctivae are not pale.  ENT:  Patent airway.  Neck:  Supple. Full ROM.    Cardiovascular:  Regular rate. Regular rhythm. No murmurs, rubs, or gallops.  Respiratory:  No evidence of respiratory distress. Clear to auscultation bilaterally.  No wheezing, rales or rhonchi.   GI:  Soft and non-distended. There is no tenderness. No rebound, guarding, or rigidity.  MSK:  No edema. No cyanosis. No clubbing. Full range of motion in all extremities.  Skin:  Skin is warm and dry.  No diaphoresis. No rash.   Neurological:  Alert, awake, and appropriate. Normal speech. Motor normal.  Psychiatric:  Good eye contact. Normal interaction, affect, and behavior.        MEDICAL DECISION MAKING     DISCUSSION      Cough, likely bronchitis, will  cover with abx      Vital Signs: Reviewed the patient?s vital signs.   Nursing Notes: Reviewed and utilized available nursing notes.  Medical Records Reviewed: Reviewed available past medical records.      PROCEDURES        CARDIAC STUDIES    The following cardiac studies were independently interpreted by the Emergency Medicine Physician.  For full cardiac study results please see chart.    EKG Interpretation:    14:38 SR 1st degree av block, LAD, RBBB, qrs 110, qtc 447 (-) ST-T changes similar to ekg on 06/20/2014 as read by me.     IMAGING STUDIES    The following imaging studies were independently interpreted by the Emergency Medicine Physician.  For full imaging study results please see chart.      PULSE OXIMETRY    Oxygen Saturation by Pulse Oximetry: 97%  Interventions: none  Interpretation: normal    EMERGENCY DEPT. MEDICATIONS      ED Medication Orders     None          LABORATORY RESULTS    Ordered and independently interpreted AVAILABLE laboratory tests. Please see results section in chart for full details.  Results for orders placed or performed during the hospital encounter of 06/29/14   ECG 12 lead   Result Value Ref Range    Ventricular Rate 92 BPM    Atrial Rate 92 BPM    P-R Interval 224 ms    QRS Duration 110 ms    Q-T Interval 362 ms    QTC Calculation (Bezet) 447 ms    P Axis 30 degrees    R Axis -66 degrees    T Axis 77 degrees       CONSULTATIONS and ED Course      .Patient feels better. I have discussed all testing results and plan of care with patient. Patient agrees with going home and following up and return if worsening symptoms. Side effects of medications such as dizziness associated with opioids and muscle relaxants, nausea/vomiting and constipation associated with opioids and potential  complications of antibiotics i.e. c. diff, yeast infection, rash, allergic reaction discussed.     ATTESTATIONS      Physician Attestation: I, Dr. Zadie Rhine Latonya Nelon, MD PhD , have been the primary provider for  Cordie Grice during this Emergency Dept visit and have reviewed the chart documented for accuracy and agree with its content.       DIAGNOSIS      Diagnosis:  Final diagnoses:   Cough       Disposition:  ED Disposition     Discharge Briana Farner Sabet discharge to home/self care.    Condition at disposition: Stable            Prescriptions:  Patient's Medications   New Prescriptions    LEVOFLOXACIN (LEVAQUIN) 250 MG TABLET    Take 1 tablet (250 mg total) by mouth daily.   Previous Medications    ALBUTEROL (PROVENTIL HFA;VENTOLIN HFA) 108 (90 BASE) MCG/ACT INHALER    Inhale 2 puffs into the lungs every 4 (four) hours as needed for Wheezing or Shortness of Breath (coughing). Dispense with spacer    BENZONATATE (TESSALON PERLES) 100 MG CAPSULE    Take 1 capsule (100 mg total) by mouth 3 (three) times daily as needed for Cough.    CHOLECALCIFEROL (VITAMIN D) 1000 UNIT TABLET    Take 1,000 Units by mouth daily.    CLONIDINE (CATAPRES) 0.2 MG TABLET    Take 0.2 mg by mouth 2 (two) times daily.    DILTIAZEM HCL ER BEADS (TIAZAC PO)    Take by mouth.    ESOMEPRAZOLE (NEXIUM) 40 MG CAPSULE    Take 40 mg by mouth every morning before breakfast.    FERROUS SULFATE 325 (65 FE) MG TABLET    Take 325 mg by mouth every morning with breakfast.    INSULIN ASPART (NOVOLOG 70/30) (70-30) 100 UNIT/ML INJECTION    Inject into the skin 2 (two) times daily before meals.    INSULIN DETEMIR (LEVEMIR) 100 UNIT/ML INJECTION    Inject into the skin.    METFORMIN (GLUCOPHAGE) 850 MG TABLET    Take 850 mg by mouth 2 (two) times daily with meals.    OLMESARTAN MEDOXOMIL (BENICAR PO)    Take by mouth.    RIVAROXABAN (XARELTO PO)    Take by mouth.    VITAMIN B-12 (CYANOCOBALAMIN) 500 MCG TABLET    Take 500 mcg by mouth daily.   Modified Medications    No medications on file   Discontinued Medications    No medications on file           Judieth Mckown, Zadie Rhine, MD [facility]  06/30/14 [phone]

## 2014-06-29 NOTE — ED Notes (Signed)
Bed: M 11  Expected date:   Expected time:   Means of arrival:   Comments:

## 2014-06-29 NOTE — ED Notes (Signed)
MD updated that patient did not take her BP medication this morning. MD stated that patient could take her home medications for blood pressure at this time

## 2014-07-08 LAB — ECG 12-LEAD
Atrial Rate: 92 {beats}/min
P Axis: 30 degrees
P-R Interval: 224 ms
Q-T Interval: 362 ms
QRS Duration: 110 ms
QTC Calculation (Bezet): 447 ms
R Axis: -66 degrees
T Axis: 77 degrees
Ventricular Rate: 92 {beats}/min

## 2015-03-23 DIAGNOSIS — E785 Hyperlipidemia, unspecified: Secondary | ICD-10-CM | POA: Insufficient documentation

## 2015-03-23 DIAGNOSIS — I10 Essential (primary) hypertension: Secondary | ICD-10-CM | POA: Insufficient documentation

## 2015-03-23 DIAGNOSIS — Z91199 Patient's noncompliance with other medical treatment and regimen due to unspecified reason: Secondary | ICD-10-CM | POA: Insufficient documentation

## 2015-04-27 ENCOUNTER — Encounter: Payer: Self-pay | Admitting: Podiatry

## 2015-04-27 ENCOUNTER — Ambulatory Visit (INDEPENDENT_AMBULATORY_CARE_PROVIDER_SITE_OTHER): Payer: Medicare HMO | Admitting: Podiatry

## 2015-04-27 VITALS — BP 163/74 | HR 73 | Resp 14

## 2015-04-27 DIAGNOSIS — B351 Tinea unguium: Secondary | ICD-10-CM | POA: Diagnosis not present

## 2015-04-27 DIAGNOSIS — M79676 Pain in unspecified toe(s): Secondary | ICD-10-CM | POA: Diagnosis not present

## 2015-04-27 NOTE — Progress Notes (Signed)
   Subjective:    Patient ID: [name], female    DOB: Feb 10, 1932, 82 y.o.   MRN: [id number]  HPI this patient presents to the office with chief complaint of long painful nails. He states the nails are painful as she walks and wears her shoes. She says that she has bunions and hammertoes which the doctor at the orthopedic clinic wanted to correct that she declined. She says she also was treated for heel pain by the orthopedic clinic, which was successful. She says that she is a diabetic and she presents the office today for an evaluation of her diabetes and treatment of her nails    Review of Systems  All other systems reviewed and are negative.      Objective:   Physical Exam GENERAL APPEARANCE: Alert, conversant. Appropriately groomed. No acute distress.  VASCULAR: Pedal pulses are  palpable at  [facility] and PT bilateral.  Capillary refill time is immediate to all digits,  Normal temperature gradient.  Digital hair growth is present bilateral  NEUROLOGIC: sensation is normal to 5.07 monofilament at 5/5 sites bilateral.  Light touch is intact bilateral, Muscle strength normal.  MUSCULOSKELETAL: acceptable muscle strength, tone and stability bilateral.  Intrinsic muscluature intact bilateral.  Rectus appearance of foot and digits noted bilateral.Severe HAV 1st MPJ  B/L.  Hammer toes B/L.   DERMATOLOGIC: skin color, texture, and turgor are within normal limits.  No preulcerative lesions or ulcers  are seen, no interdigital maceration noted.  No open lesions present.  . No drainage noted.  NAILS  Thick disfigured discolored nails with no infection noted.          Assessment & Plan:    Onychomycosis B/L  IE  Debridement of nails.  Diabetic foot exam.  RTC 3 months.  Patient inquired about having her feet soaked.  I told her we soaked her toenails prior to nail treatment.    She was disappointed.     Gardiner Barefoot DPM

## 2015-07-20 ENCOUNTER — Ambulatory Visit: Payer: Medicare HMO | Admitting: Podiatry

## 2016-04-07 ENCOUNTER — Encounter (HOSPITAL_COMMUNITY): Payer: Self-pay

## 2016-04-07 DIAGNOSIS — K219 Gastro-esophageal reflux disease without esophagitis: Secondary | ICD-10-CM | POA: Diagnosis not present

## 2016-04-07 DIAGNOSIS — Z7982 Long term (current) use of aspirin: Secondary | ICD-10-CM | POA: Insufficient documentation

## 2016-04-07 DIAGNOSIS — Z6833 Body mass index (BMI) 33.0-33.9, adult: Secondary | ICD-10-CM | POA: Insufficient documentation

## 2016-04-07 DIAGNOSIS — Z79899 Other long term (current) drug therapy: Secondary | ICD-10-CM | POA: Diagnosis not present

## 2016-04-07 DIAGNOSIS — E1165 Type 2 diabetes mellitus with hyperglycemia: Secondary | ICD-10-CM | POA: Diagnosis not present

## 2016-04-07 DIAGNOSIS — D509 Iron deficiency anemia, unspecified: Secondary | ICD-10-CM | POA: Diagnosis not present

## 2016-04-07 DIAGNOSIS — Z7901 Long term (current) use of anticoagulants: Secondary | ICD-10-CM | POA: Insufficient documentation

## 2016-04-07 DIAGNOSIS — E669 Obesity, unspecified: Secondary | ICD-10-CM | POA: Insufficient documentation

## 2016-04-07 DIAGNOSIS — G459 Transient cerebral ischemic attack, unspecified: Principal | ICD-10-CM | POA: Insufficient documentation

## 2016-04-07 DIAGNOSIS — I10 Essential (primary) hypertension: Secondary | ICD-10-CM | POA: Diagnosis not present

## 2016-04-07 DIAGNOSIS — Z794 Long term (current) use of insulin: Secondary | ICD-10-CM | POA: Insufficient documentation

## 2016-04-07 DIAGNOSIS — E785 Hyperlipidemia, unspecified: Secondary | ICD-10-CM | POA: Diagnosis not present

## 2016-04-07 DIAGNOSIS — Z9049 Acquired absence of other specified parts of digestive tract: Secondary | ICD-10-CM | POA: Diagnosis not present

## 2016-04-07 DIAGNOSIS — R531 Weakness: Secondary | ICD-10-CM | POA: Diagnosis present

## 2016-04-07 NOTE — ED Triage Notes (Signed)
Pt complaining of L arm weakness this afternoon. Pt denies any pain, numbness or tingling. Pt with no obvious neuro deficits at triage. Pt a/o x 4, NAD.

## 2016-04-08 ENCOUNTER — Emergency Department (HOSPITAL_COMMUNITY): Payer: Medicare HMO

## 2016-04-08 ENCOUNTER — Observation Stay (HOSPITAL_BASED_OUTPATIENT_CLINIC_OR_DEPARTMENT_OTHER): Payer: Medicare HMO

## 2016-04-08 ENCOUNTER — Observation Stay (HOSPITAL_COMMUNITY)
Admission: EM | Admit: 2016-04-08 | Discharge: 2016-04-09 | Disposition: A | Discharge status: Home / Self Care | Payer: Medicare HMO | Attending: Family Medicine | Admitting: Family Medicine

## 2016-04-08 DIAGNOSIS — G459 Transient cerebral ischemic attack, unspecified: Secondary | ICD-10-CM | POA: Diagnosis present

## 2016-04-08 DIAGNOSIS — I10 Essential (primary) hypertension: Secondary | ICD-10-CM | POA: Diagnosis not present

## 2016-04-08 DIAGNOSIS — I639 Cerebral infarction, unspecified: Secondary | ICD-10-CM

## 2016-04-08 DIAGNOSIS — Z794 Long term (current) use of insulin: Secondary | ICD-10-CM

## 2016-04-08 DIAGNOSIS — E1165 Type 2 diabetes mellitus with hyperglycemia: Secondary | ICD-10-CM | POA: Diagnosis present

## 2016-04-08 DIAGNOSIS — E118 Type 2 diabetes mellitus with unspecified complications: Secondary | ICD-10-CM | POA: Diagnosis not present

## 2016-04-08 DIAGNOSIS — IMO0002 Reserved for concepts with insufficient information to code with codable children: Secondary | ICD-10-CM | POA: Diagnosis present

## 2016-04-08 DIAGNOSIS — G458 Other transient cerebral ischemic attacks and related syndromes: Secondary | ICD-10-CM | POA: Diagnosis not present

## 2016-04-08 HISTORY — DX: Cerebral infarction, unspecified: I63.9

## 2016-04-08 LAB — BASIC METABOLIC PANEL
Anion gap: 13 (ref 5–15)
BUN: 17 mg/dL (ref 6–20)
CO2: 24 mmol/L (ref 22–32)
Calcium: 9.4 mg/dL (ref 8.9–10.3)
Chloride: 100 mmol/L — ABNORMAL LOW (ref 101–111)
Creatinine, Ser: 1.11 mg/dL — ABNORMAL HIGH (ref 0.44–1.00)
GFR calc Af Amer: 53 mL/min — ABNORMAL LOW (ref >60)
GFR calc non Af Amer: 45 mL/min — ABNORMAL LOW (ref >60)
GLUCOSE: 329 mg/dL — AB (ref 65–99)
POTASSIUM: 3.9 mmol/L (ref 3.5–5.1)
Sodium: 137 mmol/L (ref 135–145)

## 2016-04-08 LAB — I-STAT TROPONIN, ED: TROPONIN I, POC: 0 ng/mL (ref 0.00–0.08)

## 2016-04-08 LAB — ECHOCARDIOGRAM COMPLETE
Height: 63 in
Weight: 3040 oz

## 2016-04-08 LAB — I-STAT CHEM 8, ED
BUN: 25 mg/dL — AB (ref 6–20)
CALCIUM ION: 0.91 mmol/L — AB (ref 1.15–1.40)
CHLORIDE: 103 mmol/L (ref 101–111)
CREATININE: 1 mg/dL (ref 0.44–1.00)
Glucose, Bld: 376 mg/dL — ABNORMAL HIGH (ref 65–99)
HCT: 39 % (ref 36.0–46.0)
Hemoglobin: 13.3 g/dL (ref 12.0–15.0)
Potassium: 4.4 mmol/L (ref 3.5–5.1)
Sodium: 130 mmol/L — ABNORMAL LOW (ref 135–145)
TCO2: 21 mmol/L (ref 0–100)

## 2016-04-08 LAB — CBC WITH DIFFERENTIAL/PLATELET
BASOS PCT: 1 %
Basophils Absolute: 0.1 10*3/uL (ref 0.0–0.1)
EOS ABS: 0.3 10*3/uL (ref 0.0–0.7)
Eosinophils Relative: 4 %
HEMATOCRIT: 37.9 % (ref 36.0–46.0)
HEMOGLOBIN: 12.8 g/dL (ref 12.0–15.0)
LYMPHS ABS: 4.6 10*3/uL — AB (ref 0.7–4.0)
Lymphocytes Relative: 55 %
MCH: 26.9 pg (ref 26.0–34.0)
MCHC: 33.8 g/dL (ref 30.0–36.0)
MCV: 79.6 fL (ref 78.0–100.0)
MONOS PCT: 12 %
Monocytes Absolute: 0.9 10*3/uL (ref 0.1–1.0)
NEUTROS ABS: 2.3 10*3/uL (ref 1.7–7.7)
Neutrophils Relative %: 28 %
Platelets: 377 10*3/uL (ref 150–400)
RBC: 4.76 MIL/uL (ref 3.87–5.11)
RDW: 16.8 % — ABNORMAL HIGH (ref 11.5–15.5)
WBC: 8.2 10*3/uL (ref 4.0–10.5)

## 2016-04-08 LAB — URINALYSIS, ROUTINE W REFLEX MICROSCOPIC
BILIRUBIN URINE: NEGATIVE
Bacteria, UA: NONE SEEN
HGB URINE DIPSTICK: NEGATIVE
Ketones, ur: NEGATIVE mg/dL
Leukocytes, UA: NEGATIVE
NITRITE: NEGATIVE
Protein, ur: NEGATIVE mg/dL
SPECIFIC GRAVITY, URINE: 1.007 (ref 1.005–1.030)
Squamous Epithelial / LPF: NONE SEEN
pH: 7 (ref 5.0–8.0)

## 2016-04-08 LAB — LIPID PANEL
CHOL/HDL RATIO: 2.7 ratio
Cholesterol: 145 mg/dL (ref 0–200)
HDL: 54 mg/dL (ref >40)
LDL Cholesterol: 76 mg/dL (ref 0–99)
TRIGLYCERIDES: 73 mg/dL (ref <150)
VLDL: 15 mg/dL (ref 0–40)

## 2016-04-08 LAB — PROTIME-INR
INR: 1.41
Prothrombin Time: 17.4 seconds — ABNORMAL HIGH (ref 11.4–15.2)

## 2016-04-08 LAB — GLUCOSE, CAPILLARY
GLUCOSE-CAPILLARY: 97 mg/dL (ref 65–99)
Glucose-Capillary: 301 mg/dL — ABNORMAL HIGH (ref 65–99)
Glucose-Capillary: 253 mg/dL — ABNORMAL HIGH (ref 65–99)

## 2016-04-08 MED ORDER — INSULIN DETEMIR 100 UNIT/ML ~~LOC~~ SOLN: 20.0000 [IU] | Freq: Every day | SUBCUTANEOUS | Status: DC

## 2016-04-08 MED ORDER — ASPIRIN EC 81 MG PO TBEC: 81.0000 mg | DELAYED_RELEASE_TABLET | Freq: Every morning | ORAL | Status: DC

## 2016-04-08 MED ORDER — STROKE: EARLY STAGES OF RECOVERY BOOK
Freq: Once | Status: AC
Start: 2016-04-08 — End: 2016-04-08
  Administered 2016-04-08: 06:00:00
  Filled 2016-04-08: qty 1

## 2016-04-08 MED ORDER — CLONIDINE HCL 0.1 MG PO TABS
0.2000 mg | ORAL_TABLET | Freq: Three times a day (TID) | ORAL | Status: DC
  Administered 2016-04-08 – 2016-04-09 (×4): 0.2 mg via ORAL
  Filled 2016-04-08 (×4): qty 2

## 2016-04-08 MED ORDER — IRBESARTAN 300 MG PO TABS
300.0000 mg | ORAL_TABLET | Freq: Every day | ORAL | Status: DC
  Administered 2016-04-08: 300 mg via ORAL
  Filled 2016-04-08 (×2): qty 1

## 2016-04-08 MED ORDER — INSULIN ASPART 100 UNIT/ML ~~LOC~~ SOLN
0.0000 [IU] | Freq: Three times a day (TID) | SUBCUTANEOUS | Status: DC
  Administered 2016-04-08: 5 [IU] via SUBCUTANEOUS
  Administered 2016-04-08: 7 [IU] via SUBCUTANEOUS
  Administered 2016-04-09 (×2): 3 [IU] via SUBCUTANEOUS

## 2016-04-08 MED ORDER — RIVAROXABAN 20 MG PO TABS
20.0000 mg | ORAL_TABLET | Freq: Every day | ORAL | Status: DC
  Administered 2016-04-08: 20 mg via ORAL
  Filled 2016-04-08: qty 1

## 2016-04-08 MED ORDER — PANTOPRAZOLE SODIUM 40 MG PO TBEC
40.0000 mg | DELAYED_RELEASE_TABLET | Freq: Every day | ORAL | Status: DC
Start: 2016-04-08 — End: 2016-04-09
  Administered 2016-04-09: 40 mg via ORAL
  Filled 2016-04-08 (×2): qty 1

## 2016-04-08 MED ORDER — ACETAMINOPHEN 160 MG/5ML PO SOLN: 650.0000 mg | ORAL | Status: DC | PRN

## 2016-04-08 MED ORDER — SODIUM CHLORIDE 0.9 % IV SOLN
1.0000 g | Freq: Once | INTRAVENOUS | Status: AC
  Administered 2016-04-08: 1 g via INTRAVENOUS
  Filled 2016-04-08: qty 10

## 2016-04-08 MED ORDER — SENNOSIDES-DOCUSATE SODIUM 8.6-50 MG PO TABS: 1.0000 | ORAL_TABLET | Freq: Every evening | ORAL | Status: DC | PRN

## 2016-04-08 MED ORDER — SODIUM CHLORIDE 0.9 % IV SOLN
INTRAVENOUS | Status: DC
  Administered 2016-04-08: 11:00:00 via INTRAVENOUS

## 2016-04-08 MED ORDER — ACETAMINOPHEN 325 MG PO TABS: 650.0000 mg | ORAL_TABLET | ORAL | Status: DC | PRN

## 2016-04-08 MED ORDER — ACETAMINOPHEN 650 MG RE SUPP: 650.0000 mg | RECTAL | Status: DC | PRN

## 2016-04-08 MED ORDER — DILTIAZEM HCL ER COATED BEADS 180 MG PO CP24
300.0000 mg | ORAL_CAPSULE | Freq: Every morning | ORAL | Status: DC
  Administered 2016-04-08 – 2016-04-09 (×2): 300 mg via ORAL
  Filled 2016-04-08 (×2): qty 1

## 2016-04-08 MED ORDER — INSULIN GLARGINE 100 UNIT/ML ~~LOC~~ SOLN
20.0000 [IU] | Freq: Every day | SUBCUTANEOUS | Status: DC
  Administered 2016-04-08: 20 [IU] via SUBCUTANEOUS
  Filled 2016-04-08 (×3): qty 0.2

## 2016-04-08 MED ORDER — INSULIN ASPART 100 UNIT/ML ~~LOC~~ SOLN: 0.0000 [IU] | Freq: Every day | SUBCUTANEOUS | Status: DC

## 2016-04-08 NOTE — ED Notes (Signed)
IV team not able to gain IV access. 60M nurse aware

## 2016-04-08 NOTE — H&P (Signed)
History and Physical    [name] [id number] DOB: 1932-07-01 DOA: 04/08/2016  Referring MD/NP/PA: Dr. Kathrynn Humble PCP: No PCP Per Patient  Patient coming from: Home  Chief Complaint: Left arm weakness and numbness  HPI: [name] is a 83 y.o. female with medical history significant of HTN, DM type II, anemia, obesity, thrombocytosis,  and TIA/CVA; who presents with complaints of left arm weakness since yesterday afternoon. Patient reports feeling funny feeling in her left arm. Noted generalized weakness and arthralgias were now. She questioned she possibly slept on it and it was sleep. She tried shaking her arm and raising it above her head without relief of symptoms. She called her daughter who is at RN and immediately came to pick her up to the hospital for further evaluation. Even in the ED patient still complained of numbness from the elbow up. Other associated symptoms include mild cold symptoms. Patient took Zicam the other day with some improvement in symptoms.  Patient makes note that  Dr. Glennon Mac is her cardiologist, but he is not her primary care provider. She is being set up through [facility] to see a new PCP. At baseline patient lives alone and is able to complete all of her ADLs without need of assistance.  ED Course: Upon admission into the emergency department patient was seen to be afebrile vital signs relatively within normal limits. Labs revealed a normal CBC, sodium 130, potassium 4.4, CO2 21, BUN 25, creatinine 1,  glucose 376ionized calcium 0.91. CT scan of the brain showed no acute abnormalities. TRH call to complete stroke workup. Currently left weakness symptoms have resolved.  Review of Systems: As per HPI otherwise 10 point review of systems negative.   Past Medical History:  Diagnosis Date  . ANEMIA, IRON DEFICIENCY 08/11/2008  . ANGIOEDEMA 03/01/2008  . CEREBROVASCULAR ACCIDENT, HX OF 03/01/2008  . CONTACT DERMATITIS&OTHER ECZEMA DUE UNSPEC CAUSE 11/28/2008  .  DIABETES MELLITUS 03/01/2008  . Dysuria 01/04/2010  . GERD 03/01/2008  . HYPERSOMNIA 05/30/2009  . HYPERTENSION 03/01/2008  . INTERNAL HEMORRHOIDS 03/01/2008  . KNEE PAIN, RIGHT 09/05/2009  . NONSPECIFIC ABN FINDNG RAD&OTH EXAM BILARY TRCT 03/07/2008  . OBESITY 01/10/2010  . PERIPHERAL EDEMA 08/10/2008  . POLYP, GALLBLADDER 03/07/2008  . SMALL BOWEL OBSTRUCTION, HX OF 03/01/2008  . SNORING 05/11/2009  . THROMBOCYTOSIS 08/18/2008    Past Surgical History:  Procedure Laterality Date  . HERNIA REPAIR  09/14/08   with release [facility]-Dr. Dalbert Batman  . SMALL INTESTINE SURGERY    . 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 She has never used smokeless tobacco. She reports that she does not drink alcohol or use drugs.  Allergies  Allergen Reactions  . Ace Inhibitors Swelling    angioedema  . Prednisone Nausea And Vomiting, Other (See Comments) and Hypertension    BP and blood sugar increased  . Penicillins Rash  . Sulfamethoxazole Rash  . Sulfonamide Derivatives Rash    Family History  Problem Relation Age of Onset  . Diabetes Mother   . Heart disease Mother   . Diabetes Sister   . Heart disease Sister   . Colitis Sister   . Kidney disease Brother   . Breast cancer Other     Neice  . Allergies Other     Nephrew  . Asthma Other     Nephrew    Prior to Admission medications   Medication  Sig Start Date End Date Taking? Authorizing Provider  aspirin 81 MG tablet Take 81 mg by mouth every morning.     Historical Provider, MD  Cholecalciferol (VITAMIN D) 2000 UNITS CAPS Take 1 capsule by mouth every morning.    Historical Provider, MD  clindamycin (CLEOCIN) 75 MG/5ML solution Take 20 mLs (300 mg total) by mouth 3 (three) times daily. 09/08/13   Liam Graham, PA-C  cloNIDine (CATAPRES) 0.2 MG tablet Take 0.2 mg by mouth 3 (three) times daily.     Historical Provider, MD  Cranberry (SM CRANBERRY) 300 MG tablet Take  300 mg by mouth 2 (two) times daily.    Historical Provider, MD  cyanocobalamin 500 MCG tablet Take 500 mcg by mouth every morning.    Historical Provider, MD  diltiazem (CARDIZEM CD) 300 MG 24 hr capsule Take 300 mg by mouth every morning.     Historical Provider, MD  doxycycline (VIBRA-TABS) 100 MG tablet Take 1 tablet (100 mg total) by mouth 2 (two) times daily. 04/03/14   Melony Overly, MD  esomeprazole (NEXIUM) 40 MG capsule Take 40 mg by mouth daily before breakfast.      Historical Provider, MD  Ferrous Sulfate (FE-CAPS) 250 MG CPCR Take 1 capsule by mouth daily.     Historical Provider, MD  fluconazole (DIFLUCAN) 10 MG/ML suspension Take 10 mLs (100 mg total) by mouth daily. 09/08/13   Freeman Caldron Baker, PA-C  insulin aspart (NOVOLOG) 100 UNIT/ML injection Inject 0-7 Units into the skin 3 (three) times daily before meals. SSI: <150 = no insulin, 151-225 = 3 units, 226-300 = 4 units, 301-375 = 5 units, 376-450 = 6 units, >450 = 7 units    Historical Provider, MD  insulin detemir (LEVEMIR) 100 UNIT/ML injection Inject 20 Units into the skin at bedtime.    Historical Provider, MD  metFORMIN (GLUCOPHAGE) 850 MG tablet Take 850 mg by mouth 2 (two) times daily with a meal.    Historical Provider, MD  Multiple Vitamin (MULTIVITAMIN) tablet Take 1 tablet by mouth every morning.     Historical Provider, MD  olmesartan (BENICAR) 40 MG tablet Take 40 mg by mouth every morning.    Historical Provider, MD  Probiotic Product (PROBIOTIC FORMULA PO) Take 3 scoop by mouth every morning.     Historical Provider, MD  Rivaroxaban (XARELTO) 20 MG TABS tablet Take 20 mg by mouth daily with supper.    Historical Provider, MD    Physical Exam:  Constitutional: Elderly female in NAD, calm, comfortable Vitals:   04/07/16 2221 04/08/16 0115 04/08/16 0236  BP: (!) 148/73 (!) 165/69   Pulse: 64 66   Resp: 18 11   Temp: 97.8 F (36.6 C)  97.8 F (36.6 C)  TempSrc: Oral    SpO2: 97% 98%   Weight: 81.6 kg (180 lb)     Height: 5\' 2"  (1.575 m)     Eyes: PERRL, lids and conjunctivae normal ENMT: Mucous membranes are moist. Posterior pharynx clear of any exudate or lesions.Normal dentition.  Neck: normal, supple, no masses, no thyromegaly Respiratory: clear to auscultation bilaterally, no wheezing, no crackles. Normal respiratory effort. No accessory muscle use.  Cardiovascular: Regular rate and rhythm, no murmurs / rubs / gallops. No extremity edema. 2+ pedal pulses. No carotid bruits.  Abdomen: no tenderness, no masses palpated. No hepatosplenomegaly. Bowel sounds positive.  Musculoskeletal: no clubbing / cyanosis. No joint deformity upper and lower extremities. Good ROM, no contractures. Normal muscle tone.  Skin: no  rashes, lesions, ulcers. No induration Neurologic: CN 2-12 grossly intact. Sensation intact, DTR normal. Strength 5/5 in all 4.  Psychiatric: Normal judgment and insight. Alert and oriented x 3. Normal mood.     Labs on Admission: I have personally reviewed following labs and imaging studies  CBC:  Recent Labs Lab 04/08/16 0204 04/08/16 0207  WBC  --  8.2  NEUTROABS  --  2.3  HGB 13.3 12.8  HCT 39.0 37.9  MCV  --  79.6  PLT  --  601   Basic Metabolic Panel:  Recent Labs Lab 04/08/16 0204  NA 130*  K 4.4  CL 103  GLUCOSE 376*  BUN 25*  CREATININE 1.00   GFR: Estimated Creatinine Clearance: 43.7 mL/min (by C-G formula based on SCr of 1 mg/dL). Liver Function Tests: No results for input(s): AST, ALT, ALKPHOS, BILITOT, PROT, ALBUMIN in the last 168 hours. No results for input(s): LIPASE, AMYLASE in the last 168 hours. No results for input(s): AMMONIA in the last 168 hours. Coagulation Profile:  Recent Labs Lab 04/08/16 0210  INR 1.41   Cardiac Enzymes: No results for input(s): CKTOTAL, CKMB, CKMBINDEX, TROPONINI in the last 168 hours. BNP (last 3 results) No results for input(s): PROBNP in the last 8760 hours. HbA1C: No results for input(s): HGBA1C in the last  72 hours. CBG: No results for input(s): GLUCAP in the last 168 hours. Lipid Profile: No results for input(s): CHOL, HDL, LDLCALC, TRIG, CHOLHDL, LDLDIRECT in the last 72 hours. Thyroid Function Tests: No results for input(s): TSH, T4TOTAL, FREET4, T3FREE, THYROIDAB in the last 72 hours. Anemia Panel: No results for input(s): VITAMINB12, FOLATE, FERRITIN, TIBC, IRON, RETICCTPCT in the last 72 hours. Urine analysis:    Component Value Date/Time   COLORURINE STRAW (A) 04/08/2016 0124   APPEARANCEUR CLEAR 04/08/2016 0124   LABSPEC 1.007 04/08/2016 0124   PHURINE 7.0 04/08/2016 0124   GLUCOSEU >=500 (A) 04/08/2016 0124   GLUCOSEU NEGATIVE 01/04/2010 1046   HGBUR NEGATIVE 04/08/2016 0124   BILIRUBINUR NEGATIVE 04/08/2016 0124   KETONESUR NEGATIVE 04/08/2016 0124   PROTEINUR NEGATIVE 04/08/2016 0124   UROBILINOGEN 0.2 01/04/2010 1046   NITRITE NEGATIVE 04/08/2016 0124   LEUKOCYTESUR NEGATIVE 04/08/2016 0124   Sepsis Labs: No results found for this or any previous visit (from the past 240 hour(s)).   Radiological Exams on Admission: Ct Head Wo Contrast  Result Date: 04/08/2016 CLINICAL DATA:  83 year old female with left-sided weakness and ectasia. EXAM: CT HEAD WITHOUT CONTRAST TECHNIQUE: Contiguous axial images were obtained from the base of the skull through the vertex without intravenous contrast. COMPARISON:  Head CT dated 03/10/2007 FINDINGS: Brain: There is moderate age-related atrophy and chronic microvascular ischemic changes. Small old bilateral cerebellar hemisphere infarcts and encephalomalacia. There is no acute intracranial hemorrhage. No mass effect or midline shift noted. No intra-axial fluid collection. Vascular: No hyperdense vessel or unexpected calcification. Skull: Normal. Negative for fracture or focal lesion. Sinuses/Orbits: Mild diffuse mucoperiosteal thickening of the paranasal sinuses. No air-fluid levels. The mastoid air cells are clear. Other: None IMPRESSION: 1.  No acute intracranial hemorrhage. 2. Moderate chronic microvascular ischemic changes. If symptoms persist, and there are no contraindications, MRI may provide better evaluation if clinically indicated. Electronically Signed   By: Anner Crete M.D.   On: 04/08/2016 03:08    EKG: Independently reviewed. Sinus rhythm with prolonged PR interval  Assessment/Plan Transient  ischemic attack: Patient presents with left arm weakness which is now resolved. - Admit to a telemetry bed -  Neuro checks - Check hemoglobin A1c and lipid panel - Check echocardiogram - Questioned utility of MRI at this time with patient reporting no symptoms. - Consider need of consultation neurology in a.m.  Diabetes mellitus type 2 with hyperglycemia: Last available hemoglobin A1c was noted to be 9.6 in 09/2015  - Hypoglycemic protocol - Hold metformin - CBGs every before meals and at bedtime with sensitive SSI - Continue Levemir 20 units subcutaneously at bedtime  Essential hypertension - Continue Cardizem, clonidine, and pharmacy substitution of irbesartan  Hypocalcemia: Ionized calcium noted to be low at 0.91 - Give 1 g of calcium gluconate IV - Continue to monitor and replace as needed   H/O TIA/stroke: Patient states that she is on Xarelto because of her history of stroke and does not necessarily take aspirin.  DVT prophylaxis: Continue Xarelto Code Status:  Full code Family Communication: Discussed plan of care with patient and family present at bedside  Disposition Plan: Likely discharge home in medically stable and workup negative Consults called: none  Admission status: Observation  Norval Morton MD [facility] Pager [phone]  If 7PM-7AM, please contact night-coverage [email or web address] Password [facility]  04/08/2016, 3:54 AM

## 2016-04-08 NOTE — ED Notes (Signed)
This EMT attempted to draw labs from patient. Unable to complete all labs at this time. Per patient and daughter, patient is a hard stick and typically requires IV team to access veins and requested they be called for additional labs. MD and RN made aware of this.

## 2016-04-08 NOTE — Care Management Note (Signed)
Case Management Note  Patient Details  Name: [name] MRN: [id number] Date of Birth: 12/29/1932  Subjective/Objective:        Patient presented with left arm weakness/numbness. Work-up underway. Lives at home alone. CM will follow for discharge needs pending therapy evals and physician orders.             Action/Plan:   Expected Discharge Date:                  Expected Discharge Plan:     In-House Referral:     Discharge planning Services     Post Acute Care Choice:    Choice offered to:     DME Arranged:    DME Agency:     HH Arranged:    HH Agency:     Status of Service:     If discussed at [employer] of Stay Meetings, dates discussed:    Additional Comments:  Rolm Baptise, RN 04/08/2016, 10:47 AM

## 2016-04-08 NOTE — Progress Notes (Signed)
  Echocardiogram 2D Echocardiogram has been performed.  Jennette Dubin 04/08/2016, 10:13 AM

## 2016-04-08 NOTE — Progress Notes (Signed)
*  PRELIMINARY RESULTS* Vascular Ultrasound Carotid Duplex (Doppler) has been completed.  Preliminary findings: Findings consistent with a 1-39 percent stenosis involving the right internal carotid artery and the left internal carotid artery. Very difficult exam secondary to patient body habitus, high bifrucation and tortuous vessels.  Everrett Coombe 04/08/2016, 9:47 AM

## 2016-04-08 NOTE — Progress Notes (Signed)
Inpatient Diabetes Program Recommendations  AACE/ADA: New Consensus Statement on Inpatient Glycemic Control (2015)  Target Ranges:  Prepandial:   less than 140 mg/dL      Peak postprandial:   less than 180 mg/dL (1-2 hours)      Critically ill patients:  140 - 180 mg/dL   Results for TEA, COLLUMS (MRN [id number]) as of 04/08/2016 10:17  Ref. Range 04/08/2016 06:33  Glucose-Capillary Latest Ref Range: 65 - 99 mg/dL 301 (H)  Results for JACQUES, WILLINGHAM (MRN [id number]) as of 04/08/2016 10:17  Ref. Range 04/08/2016 02:04 04/08/2016 07:46  Glucose Latest Ref Range: 65 - 99 mg/dL 376 (H) 329 (H)  Results for JASSMINE, VANDRUFF (MRN [id number]) as of 04/08/2016 10:17  Ref. Range 11/23/2010 13:12  Hemoglobin A1C Latest Ref Range: <5.7 % 10.7 (H)   Review of Glycemic Control  Diabetes history: DM2 Outpatient Diabetes medications: Levemir 20 units QHS, Novolog 0-7 units TID with meals, Metformin 850 mg BID Current orders for Inpatient glycemic control: Levemir 20 units QHS, Novolog 0-9 units TID with meals, Novolog 0-5 units QHS  Inpatient Diabetes Program Recommendations: Insulin - Basal: Please consider changing frequency of Levemir 20 units to daily (to start now). HgbA1C: A1C in process.  Thanks, Barnie Alderman, RN, MSN, CDE Diabetes Coordinator Inpatient Diabetes Program [phone] (Team Pager from 8am to 5pm)

## 2016-04-08 NOTE — Progress Notes (Signed)
Patient undergoing TIA work up. [facility] for further evaluation and recommendations.  Pt has no new complaints.  Gen: pt in nad, alert and awake CV: no cyanosis Pulm: equal chest rise, no increased wob  Will reassess next am.  Velvet Bathe

## 2016-04-08 NOTE — Progress Notes (Signed)
Patient arrived from E.D Via wheelchair. Patient alert and oriented X4 Patient welcomed oriented to unit and room. tele initiated and verified. Paitents wit no IV acess at this time report from E.D states X2 unsuccessful attempts and IV to come on unit to try again.

## 2016-04-08 NOTE — ED Provider Notes (Addendum)
[facility] DEPT Provider Note   CSN: [id number] Arrival date & time: 04/07/16  2153 By signing my name below, I, Georgette Shell, attest that this documentation has been prepared under the direction and in the presence of Varney Biles, MD. Electronically Signed: Georgette Shell, ED Scribe. 04/08/16. 1:37 AM.  History   Chief Complaint Chief Complaint  Patient presents with  . Extremity Weakness    HPI The history is provided by the patient and a relative. No language interpreter was used.   HPI Comments: [name] is a 83 y.o. female with h/o anemia, HTN, DM, PE, and TIA, who presents to the Emergency Department complaining of constant left arm numbness and weakness onset one day ago at ~9am. She denies any pain to the area. Denies any recent injury or trauma. Pt states that she has avoided moving or using her arm with significant improvement to the area. Daughter at bedside reports that pt has also been "mixing up words she normally wouldn't mix up" beginning yesterday. Has h/o TIA which daughter reports was ~5 years ago. Pt is on Xarelto. Pt denies fever, chills, paresthesias, or any other associated symptoms.   Past Medical History:  Diagnosis Date  . ANEMIA, IRON DEFICIENCY 08/11/2008  . ANGIOEDEMA 03/01/2008  . CEREBROVASCULAR ACCIDENT, HX OF 03/01/2008  . CONTACT DERMATITIS&OTHER ECZEMA DUE UNSPEC CAUSE 11/28/2008  . DIABETES MELLITUS 03/01/2008  . Dysuria 01/04/2010  . GERD 03/01/2008  . HYPERSOMNIA 05/30/2009  . HYPERTENSION 03/01/2008  . INTERNAL HEMORRHOIDS 03/01/2008  . KNEE PAIN, RIGHT 09/05/2009  . NONSPECIFIC ABN FINDNG RAD&OTH EXAM BILARY TRCT 03/07/2008  . OBESITY 01/10/2010  . PERIPHERAL EDEMA 08/10/2008  . POLYP, GALLBLADDER 03/07/2008  . SMALL BOWEL OBSTRUCTION, HX OF 03/01/2008  . SNORING 05/11/2009  . THROMBOCYTOSIS 08/18/2008    Patient Active Problem List   Diagnosis Date Noted  . OBESITY 01/10/2010  . DYSURIA 01/04/2010  . KNEE PAIN, RIGHT 09/05/2009  . HYPERSOMNIA  05/30/2009  . URI 05/11/2009  . SNORING 05/11/2009  . CONTACT DERMATITIS&OTHER ECZEMA DUE UNSPEC CAUSE 11/28/2008  . THROMBOCYTOSIS 08/18/2008  . ANEMIA, IRON DEFICIENCY 08/11/2008  . PERIPHERAL EDEMA 08/10/2008  . POLYP, GALLBLADDER 03/07/2008  . NONSPECIFIC ABN FINDNG RAD&OTH EXAM BILARY TRCT 03/07/2008  . DIABETES MELLITUS 03/01/2008  . HYPERTENSION 03/01/2008  . INTERNAL HEMORRHOIDS 03/01/2008  . GERD 03/01/2008  . ANGIOEDEMA 03/01/2008  . CEREBROVASCULAR ACCIDENT, HX OF 03/01/2008  . SMALL BOWEL OBSTRUCTION, HX OF 03/01/2008    Past Surgical History:  Procedure Laterality Date  . HERNIA REPAIR  09/14/08   with release [facility]-Dr. Dalbert Batman  . SMALL INTESTINE SURGERY    . 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 Authorizing Provider  aspirin 81 MG tablet Take 81 mg by mouth every morning.     Historical Provider, MD  Cholecalciferol (VITAMIN D) 2000 UNITS CAPS Take 1 capsule by mouth every morning.    Historical Provider, MD  clindamycin (CLEOCIN) 75 MG/5ML solution Take 20 mLs (300 mg total) by mouth 3 (three) times daily. 09/08/13   Liam Graham, PA-C  cloNIDine (CATAPRES) 0.2 MG tablet Take 0.2 mg by mouth 3 (three) times daily.     Historical Provider, MD  Cranberry (SM CRANBERRY) 300 MG tablet Take 300 mg by mouth  2 (two) times daily.    Historical Provider, MD  cyanocobalamin 500 MCG tablet Take 500 mcg by mouth every morning.    Historical Provider, MD  diltiazem (CARDIZEM CD) 300 MG 24 hr capsule Take 300 mg by mouth every morning.     Historical Provider, MD  doxycycline (VIBRA-TABS) 100 MG tablet Take 1 tablet (100 mg total) by mouth 2 (two) times daily. 04/03/14   Melony Overly, MD  esomeprazole (NEXIUM) 40 MG capsule Take 40 mg by mouth daily before breakfast.      Historical Provider, MD    Ferrous Sulfate (FE-CAPS) 250 MG CPCR Take 1 capsule by mouth daily.     Historical Provider, MD  fluconazole (DIFLUCAN) 10 MG/ML suspension Take 10 mLs (100 mg total) by mouth daily. 09/08/13   Freeman Caldron Baker, PA-C  insulin aspart (NOVOLOG) 100 UNIT/ML injection Inject 0-7 Units into the skin 3 (three) times daily before meals. SSI: <150 = no insulin, 151-225 = 3 units, 226-300 = 4 units, 301-375 = 5 units, 376-450 = 6 units, >450 = 7 units    Historical Provider, MD  insulin detemir (LEVEMIR) 100 UNIT/ML injection Inject 20 Units into the skin at bedtime.    Historical Provider, MD  metFORMIN (GLUCOPHAGE) 850 MG tablet Take 850 mg by mouth 2 (two) times daily with a meal.    Historical Provider, MD  Multiple Vitamin (MULTIVITAMIN) tablet Take 1 tablet by mouth every morning.     Historical Provider, MD  olmesartan (BENICAR) 40 MG tablet Take 40 mg by mouth every morning.    Historical Provider, MD  Probiotic Product (PROBIOTIC FORMULA PO) Take 3 scoop by mouth every morning.     Historical Provider, MD  Rivaroxaban (XARELTO) 20 MG TABS tablet Take 20 mg by mouth daily with supper.    Historical Provider, MD    Family History Family History  Problem Relation Age of Onset  . Diabetes Mother   . Heart disease Mother   . Diabetes Sister   . Heart disease Sister   . Colitis Sister   . Kidney disease Brother   . Breast cancer Other     Neice  . Allergies Other     Nephrew  . Asthma Other     Nephrew    Social History Social History  Substance Use Topics  . Smoking status: Never Smoker  . Smokeless tobacco: Never Used     Comment: single-divorced. 5 children  . Alcohol use No     Allergies   Ace inhibitors; Prednisone; Penicillins; Sulfamethoxazole; and Sulfonamide derivatives   Review of Systems Review of Systems 10 Systems reviewed and all are negative for acute change except as noted in the HPI. Physical Exam Updated Vital Signs BP (!) 165/69   Pulse 66   Temp 97.8 F  (36.6 C)   Resp 11   Ht 5\' 2"  (1.575 m)   Wt 180 lb (81.6 kg)   SpO2 98%   BMI 32.92 kg/m   Physical Exam  Constitutional: She is oriented to person, place, and time. She appears well-developed and well-nourished.  HENT:  Head: Normocephalic.  Eyes: Conjunctivae are normal.  Cardiovascular: Normal rate.   Pulmonary/Chest: Effort normal. No respiratory distress.  Abdominal: She exhibits no distension.  Musculoskeletal: Normal range of motion.  Neurological: She is alert and oriented to person, place, and time. No cranial nerve deficit. Coordination normal.  Cerebellar exam is normal (finger to nose) Sensory exam normal for bilateral upper and lower extremities -  and patient is able to discriminate between sharp and dull. Motor exam is 4+/5   Skin: Skin is warm and dry.  Psychiatric: She has a normal mood and affect. Her behavior is normal.  Nursing note and vitals reviewed.    ED Treatments / Results  DIAGNOSTIC STUDIES: Oxygen Saturation is 97% on RA, normal by my interpretation.    COORDINATION OF CARE: 1:37 AM Discussed treatment plan with pt at bedside and pt agreed to plan.  Labs (all labs ordered are listed, but only abnormal results are displayed) Labs Reviewed  PROTIME-INR - Abnormal; Notable for the following:       Result Value   Prothrombin Time 17.4 (*)    All other components within normal limits  URINALYSIS, ROUTINE W REFLEX MICROSCOPIC - Abnormal; Notable for the following:    Color, Urine STRAW (*)    Glucose, UA >=500 (*)    All other components within normal limits  CBC WITH DIFFERENTIAL/PLATELET - Abnormal; Notable for the following:    RDW 16.8 (*)    Lymphs Abs 4.6 (*)    All other components within normal limits  I-STAT CHEM 8, ED - Abnormal; Notable for the following:    Sodium 130 (*)    BUN 25 (*)    Glucose, Bld 376 (*)    Calcium, Ion 0.91 (*)    All other components within normal limits  I-STAT TROPOININ, ED    EKG  EKG  Interpretation  Date/Time:  Monday April 08 2016 02:02:35 EDT Ventricular Rate:  71 PR Interval:    QRS Duration: 115 QT Interval:  380 QTC Calculation: 413 R Axis:   -71 Text Interpretation:  Sinus rhythm Prolonged PR interval Incomplete left bundle branch block Left ventricular hypertrophy No acute changes No significant change since last tracing Confirmed by Kathrynn Humble, MD, Thelma Comp [id number]) on 04/08/2016 2:05:36 AM       Radiology Ct Head Wo Contrast  Result Date: 04/08/2016 CLINICAL DATA:  83 year old female with left-sided weakness and ectasia. EXAM: CT HEAD WITHOUT CONTRAST TECHNIQUE: Contiguous axial images were obtained from the base of the skull through the vertex without intravenous contrast. COMPARISON:  Head CT dated 03/10/2007 FINDINGS: Brain: There is moderate age-related atrophy and chronic microvascular ischemic changes. Small old bilateral cerebellar hemisphere infarcts and encephalomalacia. There is no acute intracranial hemorrhage. No mass effect or midline shift noted. No intra-axial fluid collection. Vascular: No hyperdense vessel or unexpected calcification. Skull: Normal. Negative for fracture or focal lesion. Sinuses/Orbits: Mild diffuse mucoperiosteal thickening of the paranasal sinuses. No air-fluid levels. The mastoid air cells are clear. Other: None IMPRESSION: 1. No acute intracranial hemorrhage. 2. Moderate chronic microvascular ischemic changes. If symptoms persist, and there are no contraindications, MRI may provide better evaluation if clinically indicated. Electronically Signed   By: Anner Crete M.D.   On: 04/08/2016 03:08    Procedures Procedures (including critical care time)  Medications Ordered in ED Medications - No data to display   Initial Impression / Assessment and Plan / ED Course  I have reviewed the triage vital signs and the nursing notes.  Pertinent labs & imaging results that were available during my care of the patient were reviewed by me  and considered in my medical decision making (see chart for details).      Final Clinical Impressions(s) / ED Diagnoses   Final diagnoses:  Transient cerebral ischemia, unspecified type    New Prescriptions New Prescriptions   No medications on file   I personally  performed the services described in this documentation, which was scribed in my presence. The recorded information has been reviewed and is accurate.  Pt comes in with cc of weakness and numbness to the LUE. 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 PT is on NOAC. Pt's symptoms appeared to have resolved now. She has ABCD2 score of 4. CT head is ordered. We might consider admitting patient for TIA workup - as it appears that she has hx of carotid clots as well.    Varney Biles, MD 04/08/16 0210    Varney Biles, MD 04/08/16 [phone]

## 2016-04-08 NOTE — ED Notes (Signed)
IV nurse at bedside,  Will try to get morning labs

## 2016-04-08 NOTE — Consult Note (Signed)
Requesting Physician: Dr. Wendee Beavers    Chief Complaint: TIA/Stroke  History obtained from:  Patient    HPI:                                                                                                                                         [name] is an 83 y.o. female with history of previous TIA in the past patient is currently on Xarelto for history of upper extremity DVT. Apparently patient was doing well until last night when she suddenly felt that she could not feel her left arm. This occurred at approximately 2130 hrs. Patient called her daughter was brought to the hospital.at the time of arrival patient symptoms had resolved and patient had also taken her Xarelto that day. Thus she was not a TPA candidate nor a interventional candida. Currently patient is asymptomatic. Per daughter she does have some memory problms but she is able to follow the commands that I ask her to. She is easily distracted and talks tangentially.  Date last known well: Date: 04/07/2016 Time last known well: Time: 21:30 tPA Given: No: symptoms resolved   Past Medical History:  Diagnosis Date  . ANEMIA, IRON DEFICIENCY 08/11/2008  . ANGIOEDEMA 03/01/2008  . CEREBROVASCULAR ACCIDENT, HX OF 03/01/2008  . CONTACT DERMATITIS&OTHER ECZEMA DUE UNSPEC CAUSE 11/28/2008  . DIABETES MELLITUS 03/01/2008  . Dysuria 01/04/2010  . GERD 03/01/2008  . HYPERSOMNIA 05/30/2009  . HYPERTENSION 03/01/2008  . INTERNAL HEMORRHOIDS 03/01/2008  . KNEE PAIN, RIGHT 09/05/2009  . NONSPECIFIC ABN FINDNG RAD&OTH EXAM BILARY TRCT 03/07/2008  . OBESITY 01/10/2010  . PERIPHERAL EDEMA 08/10/2008  . POLYP, GALLBLADDER 03/07/2008  . SMALL BOWEL OBSTRUCTION, HX OF 03/01/2008  . SNORING 05/11/2009  . THROMBOCYTOSIS 08/18/2008    Past Surgical History:  Procedure Laterality Date  . HERNIA REPAIR  09/14/08   with release [facility]-Dr. Dalbert Batman  . SMALL INTESTINE SURGERY    . 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 Diabetes Mother   . Heart disease Mother   . Diabetes Sister   . Heart disease Sister   . Colitis Sister   . Kidney disease Brother   . Breast cancer Other     Neice  . Allergies Other     Nephrew  . Asthma Other     Nephrew   Social History:  reports that she has never smoked. She has never used smokeless tobacco. She reports that she does not drink alcohol or use drugs.  Allergies:  Allergies  Allergen Reactions  . Ace Inhibitors Swelling    angioedema  . Prednisone Nausea And Vomiting, Other (See Comments) and Hypertension    BP and blood sugar increased  . Penicillins Rash  . Sulfamethoxazole Rash  . Sulfonamide Derivatives Rash  Medications:                                                                                                                           Scheduled: . cloNIDine  0.2 mg Oral Q8H  . diltiazem  300 mg Oral q morning - 10a  . insulin aspart  0-5 Units Subcutaneous QHS  . insulin aspart  0-9 Units Subcutaneous TID WC  . insulin glargine  20 Units Subcutaneous Daily  . irbesartan  300 mg Oral Daily  . pantoprazole  40 mg Oral Daily  . rivaroxaban  20 mg Oral Q supper    ROS:                                                                                                                                       History obtained from the patient  General ROS: negative for - chills, fatigue, fever, night sweats, weight gain or weight loss Psychological ROS: negative for - behavioral disorder, hallucinations, memory difficulties, mood swings or suicidal ideation Ophthalmic ROS: negative for - blurry vision, double vision, eye pain or loss of vision ENT ROS: negative for - epistaxis, nasal discharge, oral lesions, sore throat, tinnitus or vertigo Allergy and Immunology ROS: negative for - hives or itchy/watery eyes Hematological and Lymphatic ROS: negative for - bleeding problems, bruising or  swollen lymph nodes Endocrine ROS: negative for - galactorrhea, hair pattern changes, polydipsia/polyuria or temperature intolerance Respiratory ROS: negative for - cough, hemoptysis, shortness of breath or wheezing Cardiovascular ROS: negative for - chest pain, dyspnea on exertion, edema or irregular heartbeat Gastrointestinal ROS: negative for - abdominal pain, diarrhea, hematemesis, nausea/vomiting or stool incontinence Genito-Urinary ROS: negative for - dysuria, hematuria, incontinence or urinary frequency/urgency Musculoskeletal ROS: negative for - joint swelling or muscular weakness Neurological ROS: as noted in HPI Dermatological ROS: negative for rash and skin lesion changes  Neurologic Examination:  Blood pressure (!) 156/73, pulse 70, temperature 97.7 F (36.5 C), temperature source Oral, resp. rate 16, height 5\' 3"  (1.6 m), weight 86.2 kg (190 lb), SpO2 98 %.  HEENT-  Normocephalic, no lesions, without obvious abnormality.  Normal external eye and conjunctiva.  Normal TM's bilaterally.  Normal auditory canals and external ears. Normal external nose, mucus membranes and septum.  Normal pharynx. Cardiovascular- S1, S2 normal, pulses palpable throughout   Lungs- chest clear, no wheezing, rales, normal symmetric air entry Abdomen- normal findings: bowel sounds normal Extremities- no edema Lymph-no adenopathy palpable Musculoskeletal-no joint tenderness, deformity or swelling Skin-warm and dry, no hyperpigmentation, vitiligo, or suspicious lesions  Neurological Examination Mental Status: Alert, oriented to hospital and near, patient is easily distracted hard of hearing and has tangential conversations.  Speech fluent without evidence of aphasia.  Able to follow 3 step commands without difficulty. Cranial Nerves: II:  Visual fields grossly normal,  III,IV, VI: ptosis not present,  extra-ocular motions intact bilaterally, pupils equal, round, reactive to light and accommodation V,VII: smile symmetric, facial light touch sensation normal bilaterally VIII: hearing decreased bilaterally which necessitates person to talk very loudly with her IX,X: uvula rises symmetrically XI: bilateral shoulder shrug XII: midline tongue extension Motor: Right : Upper extremity   5/5    Left:     Upper extremity   5/5  Lower extremity   5/5     Lower extremity   5/5 Tone and bulk:normal tone throughout; no atrophy noted Sensory: Pinprick and light touch intact throughout, bilaterally Deep Tendon Reflexes: 1+ and symmetric throughout Plantars: Right: downgoing   Left: downgoing Cerebellar: normal finger-to-nose,  Gait: not tested       Lab Results: Basic Metabolic Panel:  Recent Labs Lab 04/08/16 0204 04/08/16 0746  NA 130* 137  K 4.4 3.9  CL 103 100*  CO2  --  24  GLUCOSE 376* 329*  BUN 25* 17  CREATININE 1.00 1.11*  CALCIUM  --  9.4    Liver Function Tests: No results for input(s): AST, ALT, ALKPHOS, BILITOT, PROT, ALBUMIN in the last 168 hours. No results for input(s): LIPASE, AMYLASE in the last 168 hours. No results for input(s): AMMONIA in the last 168 hours.  CBC:  Recent Labs Lab 04/08/16 0204 04/08/16 0207  WBC  --  8.2  NEUTROABS  --  2.3  HGB 13.3 12.8  HCT 39.0 37.9  MCV  --  79.6  PLT  --  377    Cardiac Enzymes: No results for input(s): CKTOTAL, CKMB, CKMBINDEX, TROPONINI in the last 168 hours.  Lipid Panel:  Recent Labs Lab 04/08/16 0611  CHOL 145  TRIG 73  HDL 54  CHOLHDL 2.7  VLDL 15  LDLCALC 76    CBG:  Recent Labs Lab 04/08/16 0633  GLUCAP 301*    Microbiology: Results for orders placed or performed during the hospital encounter of 09/08/13  Culture, Group A Strep     Status: None   Collection Time: 09/08/13  2:45 PM  Result Value Ref Range Status   Specimen Description THROAT  Final   Special Requests NONE   Final   Culture   Final    No Beta Hemolytic Streptococci Isolated Performed at [facility]   Report Status 09/10/2013 FINAL  Final    Coagulation Studies:  Recent Labs  04/08/16 0210  LABPROT 17.4*  INR 1.41    Imaging: Ct Head Wo Contrast  Result Date: 04/08/2016 CLINICAL DATA:  83 year old female with left-sided  weakness and ectasia. EXAM: CT HEAD WITHOUT CONTRAST TECHNIQUE: Contiguous axial images were obtained from the base of the skull through the vertex without intravenous contrast. COMPARISON:  Head CT dated 03/10/2007 FINDINGS: Brain: There is moderate age-related atrophy and chronic microvascular ischemic changes. Small old bilateral cerebellar hemisphere infarcts and encephalomalacia. There is no acute intracranial hemorrhage. No mass effect or midline shift noted. No intra-axial fluid collection. Vascular: No hyperdense vessel or unexpected calcification. Skull: Normal. Negative for fracture or focal lesion. Sinuses/Orbits: Mild diffuse mucoperiosteal thickening of the paranasal sinuses. No air-fluid levels. The mastoid air cells are clear. Other: None IMPRESSION: 1. No acute intracranial hemorrhage. 2. Moderate chronic microvascular ischemic changes. If symptoms persist, and there are no contraindications, MRI may provide better evaluation if clinically indicated. Electronically Signed   By: Anner Crete M.D.   On: 04/08/2016 03:08       Assessment and plan discussed with with attending physician and they are in agreement.    Etta Quill PA-C [facility] [phone]  04/08/2016, 10:44 AM   Assessment: 83 y.o. female with transient left arm decreased sensation which resolved by the time she arrived at the ED. Currently she is symptom-free and neuro exam does not show any localizing or lateralizing symptoms. MRI was not obtained on admission secondary to her symptoms are resolving. However given her risk factors I believe that she should have an MRI  of her brain and an MRA as this might have been a stroke with symptoms that resolved. She is currently on Xarelto..  Stroke Risk Factors - hyperlipidemia and hypertension  1. HgbA1c, fasting lipid panel 2. MRI, MRA  of the brain without contrast 3. PT consult, OT consult, Speech consult 4. Echocardiogram 5. Carotid dopplers 6. Prophylactic therapy-Continue Xarelto 7. Risk factor modification 8. Telemetry monitoring 9. Frequent neuro checks 10 NPO until passes stroke swallow screen 11 please page stroke NP  Or  PA  Or MD from 8am -4 pm  as this patient from this time will be  followed by the stroke.   You can look them up on [email or web address]  Password [email or web address]

## 2016-04-09 ENCOUNTER — Observation Stay (HOSPITAL_COMMUNITY): Payer: Medicare HMO

## 2016-04-09 DIAGNOSIS — I10 Essential (primary) hypertension: Secondary | ICD-10-CM | POA: Diagnosis not present

## 2016-04-09 DIAGNOSIS — G458 Other transient cerebral ischemic attacks and related syndromes: Secondary | ICD-10-CM | POA: Diagnosis not present

## 2016-04-09 DIAGNOSIS — E1165 Type 2 diabetes mellitus with hyperglycemia: Secondary | ICD-10-CM | POA: Diagnosis not present

## 2016-04-09 DIAGNOSIS — E118 Type 2 diabetes mellitus with unspecified complications: Secondary | ICD-10-CM | POA: Diagnosis not present

## 2016-04-09 LAB — GLUCOSE, CAPILLARY
GLUCOSE-CAPILLARY: 197 mg/dL — AB (ref 65–99)
GLUCOSE-CAPILLARY: 243 mg/dL — AB (ref 65–99)
GLUCOSE-CAPILLARY: 268 mg/dL — AB (ref 65–99)

## 2016-04-09 LAB — VAS US CAROTID
LCCADDIAS: -8 cm/s
LEFT ECA DIAS: 12 cm/s
LEFT VERTEBRAL DIAS: -8 cm/s
LICAPSYS: -44 cm/s
Left CCA dist sys: -39 cm/s
Left CCA prox dias: 9 cm/s
Left CCA prox sys: 56 cm/s
Left ICA prox dias: -12 cm/s
RCCAPSYS: 63 cm/s
RIGHT ECA DIAS: -24 cm/s
RIGHT VERTEBRAL DIAS: -6 cm/s
Right CCA prox dias: 11 cm/s

## 2016-04-09 NOTE — Progress Notes (Signed)
Inpatient Diabetes Program Recommendations  AACE/ADA: New Consensus Statement on Inpatient Glycemic Control (2015)  Target Ranges:  Prepandial:   less than 140 mg/dL      Peak postprandial:   less than 180 mg/dL (1-2 hours)      Critically ill patients:  140 - 180 mg/dL   Results for FARA, WORTHY (MRN [id number]) as of 04/09/2016 11:28  Ref. Range 04/08/2016 06:33 04/08/2016 12:04 04/08/2016 16:07 04/09/2016 06:25  Glucose-Capillary Latest Ref Range: 65 - 99 mg/dL 301 (H) 97 253 (H) 243 (H)   Review of Glycemic Control  Diabetes history: DM2 Outpatient Diabetes medications: Levemir 20 units QHS, Novolog 0-7 units TID with meals, Metformin 850 mg BID Current orders for Inpatient glycemic control: Levemir 20 units daily, Novolog 0-9 units TID with meals, Novolog 0-5 units QHS  Inpatient Diabetes Program Recommendations:  Insulin - Meal Coverage: Please consider ordering Novolog 3 units TID with meals for meal coverage if patient eats at least 50% of meals. HgbA1C: A1C in process.  Thanks, Barnie Alderman, RN, MSN, CDE Diabetes Coordinator Inpatient Diabetes Program [phone] (Team Pager from 8am to 5pm)

## 2016-04-09 NOTE — Progress Notes (Signed)
STROKE TEAM PROGRESS NOTE   SUBJECTIVE (INTERVAL HISTORY) [name] is at the bedside.  She just lay down in the bed.    OBJECTIVE Temp:  [97.5 F (36.4 C)-98.4 F (36.9 C)] 97.5 F (36.4 C) (03/20 0857) Pulse Rate:  [65-80] 75 (03/20 0857) Cardiac Rhythm: Heart block (03/20 0710) Resp:  [16-20] 20 (03/20 0857) BP: (139-177)/(62-81) 146/65 (03/20 0857) SpO2:  [90 %-100 %] 99 % (03/20 0857)  CBC:  Recent Labs Lab 04/08/16 0204 04/08/16 0207  WBC  --  8.2  NEUTROABS  --  2.3  HGB 13.3 12.8  HCT 39.0 37.9  MCV  --  79.6  PLT  --  099    Basic Metabolic Panel:  Recent Labs Lab 04/08/16 0204 04/08/16 0746  NA 130* 137  K 4.4 3.9  CL 103 100*  CO2  --  24  GLUCOSE 376* 329*  BUN 25* 17  CREATININE 1.00 1.11*  CALCIUM  --  9.4    Lipid Panel:    Component Value Date/Time   CHOL 145 04/08/2016 0611   TRIG 73 04/08/2016 0611   HDL 54 04/08/2016 0611   CHOLHDL 2.7 04/08/2016 0611   VLDL 15 04/08/2016 0611   LDLCALC 76 04/08/2016 0611   HgbA1c:  Lab Results  Component Value Date   HGBA1C 10.7 (H) 11/23/2010   Urine Drug Screen: No results found for: LABOPIA, COCAINSCRNUR, LABBENZ, AMPHETMU, THCU, LABBARB    PHYSICAL EXAM Pleasant obese elderly  African american lady not in distress. . Afebrile. Head is nontraumatic. Neck is supple without bruit.    Cardiac exam no murmur or gallop. Lungs are clear to auscultation. Distal pulses are well felt. Neurological Exam ;  Awake  Alert oriented x 3. Normal speech and language.eye movements full without nystagmus.fundi were not visualized. Vision acuity and fields appear normal. Hearing is normal. Palatal movements are normal. Face symmetric. Tongue midline. Normal strength, tone, reflexes and coordination. Normal sensation. Gait deferred.  ASSESSMENT/PLAN Ms. [name] is a 83 y.o. female with history of HTN, DM type II, anemia, obesity, thrombocytosis,  and TIA/CVA presenting with L arm numbness and  weakness. She did not receive IV t-PA due to symptoms resolved.   R brain TIA  CT head no acute stroke  MRI  No acute stroke. Atrophy. small vessel disease. Partially empty sella.  MRA  MCA and PCA branch atherosclerosis  Carotid Doppler  B ICA 1-39% stenosis, high bifurcation and tortuous vessels. Difficult study.  2D Echo  EF 40-45%, no SOE  LDL 76  HgbA1c pending  Xarelto for VTE prophylaxis  Diet heart healthy/carb modified Room service appropriate? Yes; Fluid consistency: Thin  Xarelto (rivaroxaban) daily prior to admission, now on Xarelto (rivaroxaban) daily  Therapy recommendations:  No therapy needs  Disposition:  Return home  Hx Atrial Fibrillation  Home anticoagulation:  Xarelto (rivaroxaban) daily continued in the hospital  Hx DVT, bowel infarct, speen infarct in [place] many years ago. Started on coumadin at that time. Changed to Xarelto by Dr. Seth Bake years ago    Essential Hypertension  Stable   Hyperlipidemia  Home meds:  No statin  LDL 76  Consider statin at discharge  Diabetes type II  HgbA1c goal < 7.0  Other Stroke Risk Factors  Advanced age  Obesity, Body mass index is 33.66 kg/m.  Hx stroke/TIA  03/2201 small superior cerebellar vermis infarct  Other Active Problems  Hypocalcemia  Hospital day # 0  [facility]  Center See Amion for Pager information 04/09/2016 5:23 PM  I have personally examined this patient, reviewed notes, independently viewed imaging studies, participated in medical decision making and plan of care.ROS completed by me personally and pertinent positives fully documented  I have made any additions or clarifications directly to the above note. Agree with note above. She presented with a right brain TIA likely from atrial fibrillation despite anticoagulation with xarelto.I discussed available treatment options and also lack of definitive data suggesting superiority of other agents over xarelto   and patient and daughter decide to stay on xarelto for now.Time spent 25 minutes Antony Contras, MD Medical Director [facility] Pager: [phone] 04/09/2016 10:15 PM  To contact Stroke Continuity provider, please refer to [email or web address] After hours, contact General Neurology

## 2016-04-09 NOTE — Discharge Summary (Signed)
Physician Discharge Summary  [name] XFG:[id number] DOB: [birth year] DOA: 04/08/2016  PCP: No PCP Per Patient  Admit date: 04/08/2016 Discharge date: 04/09/2016  Time spent: > 35 minutes  Recommendations for Outpatient Follow-up:  1. Please continue to recommend compliance with medication regimen   Discharge Diagnoses:  Principal Problem:   TIA (transient ischemic attack) Active Problems:   Diabetes mellitus type II, uncontrolled ([name])   Essential hypertension   Hypocalcemia   Discharge Condition: stable  Diet recommendation: Heart healthy  Filed Weights   04/07/16 2221 04/08/16 0600  Weight: 81.6 kg (180 lb) 86.2 kg (190 lb)    History of present illness:   83 y.o. female with medical history significant of HTN, DM type II, anemia, obesity, thrombocytosis,  and TIA/CVA; who presents with complaints of left arm weakness since yesterday afternoon.  Presented for work up for TIA  Hospital Course:  TIA - work up negative for acute stroke - plan is to continue current medication regimen and anticoagulation regimen  Known medical problems listed above will continue medication regimen listed below  Procedures:  None  Consultations:  Neurology  Discharge Exam: Vitals:   04/09/16 0542 04/09/16 0857  BP: (!) 147/69 (!) 146/65  Pulse: 80 75  Resp: 20 20  Temp: 97.9 F (36.6 C) 97.5 F (36.4 C)    General: Pt in nad, alert and awake Cardiovascular: rrr, no rubs Respiratory: no increased wob, no wheezes  Discharge Instructions   Discharge Instructions    Diet - low sodium heart healthy    Complete by:  As directed    Discharge instructions    Complete by:  As directed    Follow up with your neurologist after hospital discharge   Increase activity slowly    Complete by:  As directed      Current Discharge Medication List    CONTINUE these medications which have NOT CHANGED   Details  cloNIDine (CATAPRES) 0.2 MG tablet Take 0.2 mg by mouth 3  (three) times daily.     Cranberry (SM CRANBERRY) 300 MG tablet Take 300 mg by mouth 2 (two) times daily as needed (for urinary issues).     diltiazem (CARTIA XT) 300 MG 24 hr capsule Take 300 mg by mouth daily.    feeding supplement, GLUCERNA SHAKE, (GLUCERNA SHAKE) LIQD Take 237 mLs by mouth 2 (two) times daily.    insulin aspart (NOVOLOG) 100 UNIT/ML injection Inject 0-10 Units into the skin See admin instructions. 2 times a day per sliding scale before meals (breakfast and evening meal/dinner) and take nothing if BGL is 136 or less    insulin detemir (LEVEMIR) 100 UNIT/ML injection Inject 20 Units into the skin at bedtime.    losartan (COZAAR) 100 MG tablet Take 100 mg by mouth daily.    metFORMIN (GLUCOPHAGE) 850 MG tablet Take 850 mg by mouth 2 (two) times daily.     Multiple Vitamin (MULTIVITAMIN) tablet Take 1 tablet by mouth every morning.     omeprazole (PRILOSEC OTC) 20 MG tablet Take 20 mg by mouth daily.    polycarbophil (FIBERCON) 625 MG tablet Take 625 mg by mouth daily as needed for mild constipation.    Probiotic Product (PROBIOTIC FORMULA PO) Take 1 tablet by mouth daily.     Rivaroxaban (XARELTO) 20 MG TABS tablet Take 20 mg by mouth daily with supper.    olmesartan (BENICAR) 40 MG tablet Take 40 mg by mouth every morning.      STOP taking these  medications     clindamycin (CLEOCIN) 75 MG/5ML solution      doxycycline (VIBRA-TABS) 100 MG tablet      fluconazole (DIFLUCAN) 10 MG/ML suspension      esomeprazole (NEXIUM) 40 MG capsule        Allergies  Allergen Reactions  . Ace Inhibitors Swelling    Angioedema   . Prednisone Nausea And Vomiting, Other (See Comments) and Hypertension    Blood sugar increased (also)  . Penicillins Rash    Has patient had a PCN reaction causing immediate rash, facial/tongue/throat swelling, SOB or lightheadedness with hypotension: Yes Has patient had a PCN reaction causing severe rash involving mucus membranes or skin  necrosis: No Has patient had a PCN reaction that required hospitalization: No Has patient had a PCN reaction occurring within the last 10 years: No If all of the above answers are "NO", then may proceed with Cephalosporin use.   . Sulfamethoxazole Rash  . Sulfonamide Derivatives Rash      The results of significant diagnostics from this hospitalization (including imaging, microbiology, ancillary and laboratory) are listed below for reference.    Significant Diagnostic Studies: Ct Head Wo Contrast  Result Date: 04/08/2016 CLINICAL DATA:  83 year old female with left-sided weakness and ectasia. EXAM: CT HEAD WITHOUT CONTRAST TECHNIQUE: Contiguous axial images were obtained from the base of the skull through the vertex without intravenous contrast. COMPARISON:  Head CT dated 03/10/2007 FINDINGS: Brain: There is moderate age-related atrophy and chronic microvascular ischemic changes. Small old bilateral cerebellar hemisphere infarcts and encephalomalacia. There is no acute intracranial hemorrhage. No mass effect or midline shift noted. No intra-axial fluid collection. Vascular: No hyperdense vessel or unexpected calcification. Skull: Normal. Negative for fracture or focal lesion. Sinuses/Orbits: Mild diffuse mucoperiosteal thickening of the paranasal sinuses. No air-fluid levels. The mastoid air cells are clear. Other: None IMPRESSION: 1. No acute intracranial hemorrhage. 2. Moderate chronic microvascular ischemic changes. If symptoms persist, and there are no contraindications, MRI may provide better evaluation if clinically indicated. Electronically Signed   By: [name] M.D.   On: 04/08/2016 03:08   Mr [name] Head Wo Contrast  Result Date: 04/09/2016 CLINICAL DATA:  Acute onset of LEFT arm numbness down to the elbow yesterday. No other symptoms. EXAM: MRI HEAD WITHOUT CONTRAST MRA HEAD WITHOUT CONTRAST TECHNIQUE: Multiplanar, multiecho pulse sequences of the brain and surrounding structures  were obtained without intravenous contrast. Angiographic images of the head were obtained using MRA technique without contrast. COMPARISON:  04/08/2016. FINDINGS: MRI HEAD FINDINGS Brain: No evidence for acute infarction, hemorrhage, mass lesion, hydrocephalus, or extra-axial fluid. Advanced atrophy. Extensive chronic microvascular ischemic change. Scattered remote BILATERAL cerebellar infarcts. Unusual prominence of the RIGHT interhemispheric fissure anteriorly, likely representing asymmetric RIGHT frontal lobe atrophy; no underlying cortical or subcortical gliosis to suggest infarct. Small BILATERAL infarcts of the deep nuclei, affecting LEFT and RIGHT basal ganglia. Partial empty sella. Vascular: Normal flow voids. Skull and upper cervical spine: Normal marrow signal. Sinuses/Orbits: Negative. Other: None. MRA HEAD FINDINGS The internal carotid arteries are widely patent. The basilar artery is widely patent. LEFT vertebral is dominant. There is no proximal stenosis of the anterior, middle, or posterior cerebral arteries. No saccular aneurysm is evident. Mild irregularity of the distal MCA and PCA branches is seen bilaterally, most likely representing intracranial atherosclerotic disease. IMPRESSION: Atrophy and small vessel disease.  No acute intracranial findings. Intracranial atherosclerotic disease affecting the distal MCA and PCA branches, but no proximal vascular stenosis or occlusion. Partial empty sella. Electronically  Signed   By: Staci Righter M.D.   On: 04/09/2016 10:44   Mr Brain Wo Contrast  Result Date: 04/09/2016 CLINICAL DATA:  Acute onset of LEFT arm numbness down to the elbow yesterday. No other symptoms. EXAM: MRI HEAD WITHOUT CONTRAST MRA HEAD WITHOUT CONTRAST TECHNIQUE: Multiplanar, multiecho pulse sequences of the brain and surrounding structures were obtained without intravenous contrast. Angiographic images of the head were obtained using MRA technique without contrast. COMPARISON:   04/08/2016. FINDINGS: MRI HEAD FINDINGS Brain: No evidence for acute infarction, hemorrhage, mass lesion, hydrocephalus, or extra-axial fluid. Advanced atrophy. Extensive chronic microvascular ischemic change. Scattered remote BILATERAL cerebellar infarcts. Unusual prominence of the RIGHT interhemispheric fissure anteriorly, likely representing asymmetric RIGHT frontal lobe atrophy; no underlying cortical or subcortical gliosis to suggest infarct. Small BILATERAL infarcts of the deep nuclei, affecting LEFT and RIGHT basal ganglia. Partial empty sella. Vascular: Normal flow voids. Skull and upper cervical spine: Normal marrow signal. Sinuses/Orbits: Negative. Other: None. MRA HEAD FINDINGS The internal carotid arteries are widely patent. The basilar artery is widely patent. LEFT vertebral is dominant. There is no proximal stenosis of the anterior, middle, or posterior cerebral arteries. No saccular aneurysm is evident. Mild irregularity of the distal MCA and PCA branches is seen bilaterally, most likely representing intracranial atherosclerotic disease. IMPRESSION: Atrophy and small vessel disease.  No acute intracranial findings. Intracranial atherosclerotic disease affecting the distal MCA and PCA branches, but no proximal vascular stenosis or occlusion. Partial empty sella. Electronically Signed   By: Staci Righter M.D.   On: 04/09/2016 10:44    Microbiology: No results found for this or any previous visit (from the past 240 hour(s)).   Labs: Basic Metabolic Panel:  Recent Labs Lab 04/08/16 0204 04/08/16 0746  NA 130* 137  K 4.4 3.9  CL 103 100*  CO2  --  24  GLUCOSE 376* 329*  BUN 25* 17  CREATININE 1.00 1.11*  CALCIUM  --  9.4   Liver Function Tests: No results for input(s): AST, ALT, ALKPHOS, BILITOT, PROT, ALBUMIN in the last 168 hours. No results for input(s): LIPASE, AMYLASE in the last 168 hours. No results for input(s): AMMONIA in the last 168 hours. CBC:  Recent Labs Lab  04/08/16 0204 04/08/16 0207  WBC  --  8.2  NEUTROABS  --  2.3  HGB 13.3 12.8  HCT 39.0 37.9  MCV  --  79.6  PLT  --  377   Cardiac Enzymes: No results for input(s): CKTOTAL, CKMB, CKMBINDEX, TROPONINI in the last 168 hours. BNP: BNP (last 3 results) No results for input(s): BNP in the last 8760 hours.  ProBNP (last 3 results) No results for input(s): PROBNP in the last 8760 hours.  CBG:  Recent Labs Lab 04/08/16 1204 04/08/16 1607 04/08/16 2110 04/09/16 0625 04/09/16 1142  GLUCAP 97 253* 197* 243* 268*    Signed:  Velvet Bathe MD.  [facility] 04/09/2016, 12:48 PM

## 2016-04-09 NOTE — Care Management Obs Status (Signed)
[place] NOTIFICATION   Patient Details  Name: [name] MRN: [id number] Date of Birth: 12-14-1932   Medicare Observation Status Notification Given:  Yes    Pollie Friar, RN 04/09/2016, 3:59 PM

## 2016-04-09 NOTE — Progress Notes (Signed)
Discharge instructions reviewed with patient/family. All questions answered at this time. Transport home by family.   Katharin Schneider, RN 

## 2016-04-10 LAB — HEMOGLOBIN A1C
Hgb A1c MFr Bld: 10.8 % — ABNORMAL HIGH (ref 4.8–5.6)
MEAN PLASMA GLUCOSE: 263 mg/dL

## 2016-06-27 ENCOUNTER — Ambulatory Visit: Payer: Medicare HMO | Admitting: Neurology

## 2016-06-28 ENCOUNTER — Encounter: Payer: Self-pay | Admitting: Neurology

## 2016-09-04 ENCOUNTER — Ambulatory Visit (INDEPENDENT_AMBULATORY_CARE_PROVIDER_SITE_OTHER): Payer: Medicare HMO | Admitting: Diagnostic Neuroimaging

## 2016-09-04 ENCOUNTER — Encounter: Payer: Self-pay | Admitting: Diagnostic Neuroimaging

## 2016-09-04 VITALS — BP 176/90 | HR 76 | Ht 62.0 in | Wt 194.6 lb

## 2016-09-04 DIAGNOSIS — G459 Transient cerebral ischemic attack, unspecified: Secondary | ICD-10-CM | POA: Diagnosis not present

## 2016-09-04 NOTE — Patient Instructions (Signed)

## 2016-09-04 NOTE — Progress Notes (Signed)
[facility]  PATIENT: [name] DOB: 25-May-1932  REFERRING CLINICIAN: Wendee Beavers, O HISTORY FROM: patient, daughter, chart review REASON FOR VISIT: new consult    HISTORICAL  CHIEF COMPLAINT:  Chief Complaint  Patient presents with  . TIA    rm 7, New Pt, hospital FU, dgtr- [place], "supposed to be doing therapy, not going, recently moved"    HISTORY OF PRESENT ILLNESS:   83 year old female with hypertension, diabetes, anemia, here for evaluation of TIA related to transient (2 minute) left arm numbness and weakness. Patient was admitted to the hospital in March 2018. TIA workup was completed. Overall patient doing well since discharge. Patient does have some minor memory loss issues. Patient living with daughter now. No recurrent stroke symptoms. Patient is tolerating medications.   REVIEW OF SYSTEMS: Full 14 system review of systems performed and negative with exception of: Only as per history of present illness.  ALLERGIES: Allergies  Allergen Reactions  . Ace Inhibitors Swelling    Angioedema   . Prednisone Nausea And Vomiting, Other (See Comments) and Hypertension    Blood sugar increased (also)  . Penicillins Rash    Has patient had a PCN reaction causing immediate rash, facial/tongue/throat swelling, SOB or lightheadedness with hypotension: Yes Has patient had a PCN reaction causing severe rash involving mucus membranes or skin necrosis: No Has patient had a PCN reaction that required hospitalization: No Has patient had a PCN reaction occurring within the last 10 years: No If all of the above answers are "NO", then may proceed with Cephalosporin use.   . Sulfamethoxazole Rash  . Sulfonamide Derivatives Rash    HOME MEDICATIONS: Outpatient Medications Prior to Visit  Medication Sig Dispense Refill  . cloNIDine (CATAPRES) 0.2 MG tablet Take 0.2 mg by mouth 3 (three) times daily.     . Cranberry (SM CRANBERRY) 300 MG tablet Take 300 mg by  mouth 2 (two) times daily as needed (for urinary issues).     Marland Kitchen diltiazem (CARTIA XT) 300 MG 24 hr capsule Take 300 mg by mouth daily.    . feeding supplement, GLUCERNA SHAKE, (GLUCERNA SHAKE) LIQD Take 237 mLs by mouth 2 (two) times daily.    . insulin aspart (NOVOLOG) 100 UNIT/ML injection Inject 0-10 Units into the skin See admin instructions. 2 times a day per sliding scale before meals (breakfast and evening meal/dinner) and take nothing if BGL is 136 or less    . insulin detemir (LEVEMIR) 100 UNIT/ML injection Inject 20 Units into the skin at bedtime.    Marland Kitchen losartan (COZAAR) 100 MG tablet Take 100 mg by mouth daily.    . metFORMIN (GLUCOPHAGE) 850 MG tablet Take 850 mg by mouth 2 (two) times daily.     . Multiple Vitamin (MULTIVITAMIN) tablet Take 1 tablet by mouth every morning.     . olmesartan (BENICAR) 40 MG tablet Take 40 mg by mouth every morning.    Marland Kitchen omeprazole (PRILOSEC OTC) 20 MG tablet Take 20 mg by mouth daily.    . polycarbophil (FIBERCON) 625 MG tablet Take 625 mg by mouth daily as needed for mild constipation.    . Probiotic Product (PROBIOTIC FORMULA PO) Take 1 tablet by mouth daily.     . Rivaroxaban (XARELTO) 20 MG TABS tablet Take 20 mg by mouth daily with supper.     No facility-administered medications prior to visit.     PAST MEDICAL HISTORY: Past Medical History:  Diagnosis Date  . ANEMIA, IRON DEFICIENCY 08/11/2008  .  ANGIOEDEMA 03/01/2008  . CEREBROVASCULAR ACCIDENT, HX OF 03/01/2008  . CONTACT DERMATITIS&OTHER ECZEMA DUE UNSPEC CAUSE 11/28/2008  . DIABETES MELLITUS 03/01/2008  . Dysuria 01/04/2010  . GERD 03/01/2008  . HYPERSOMNIA 05/30/2009  . HYPERTENSION 03/01/2008  . INTERNAL HEMORRHOIDS 03/01/2008  . KNEE PAIN, RIGHT 09/05/2009  . NONSPECIFIC ABN FINDNG RAD&OTH EXAM BILARY TRCT 03/07/2008  . OBESITY 01/10/2010  . PERIPHERAL EDEMA 08/10/2008  . POLYP, GALLBLADDER 03/07/2008  . SMALL BOWEL OBSTRUCTION, HX OF 03/01/2008  . SNORING 05/11/2009  . Stroke ([place]) 04/08/2016     TIA  . THROMBOCYTOSIS 08/18/2008    PAST SURGICAL HISTORY: Past Surgical History:  Procedure Laterality Date  . HERNIA REPAIR  09/14/08   with release [facility]-Dr. Dalbert Batman  . SMALL INTESTINE SURGERY    . 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 Diabetes Mother   . Heart disease Mother   . Diabetes Sister   . Heart disease Sister   . Colitis Sister   . Kidney disease Brother   . Breast cancer Other        Neice  . Allergies Other        Nephrew  . Asthma Other        Nephrew    SOCIAL HISTORY:  Social History   Social History  . Marital status: Divorced    Spouse name: N/A  . Number of children: 5  . Years of education: 57   Occupational History  .      counselor   Social History Main Topics  . Smoking status: Never Smoker  . Smokeless tobacco: Never Used  . Alcohol use No  . Drug use: No  . Sexual activity: Not on file   Other Topics Concern  . Not on file   Social History Narrative   Lives with daughter, [place]     PHYSICAL EXAM  GENERAL EXAM/CONSTITUTIONAL: Vitals:  Vitals:   09/04/16 0917  BP: (!) 176/90  Pulse: 76  Weight: 194 lb 9.6 oz (88.3 kg)  Height: 5\' 2"  (1.575 m)     Body mass index is 35.59 kg/m.  Visual Acuity Screening   Right eye Left eye Both eyes  Without correction: 20/30 20/30   With correction:        Patient is in no distress; well developed, nourished and groomed; neck is supple  CARDIOVASCULAR:  Examination of carotid arteries is normal; no carotid bruits  Regular rate and rhythm, no murmurs  Examination of peripheral vascular system by observation and palpation is normal  EYES:  Ophthalmoscopic exam of optic discs and posterior segments is normal; no papilledema or hemorrhages  MUSCULOSKELETAL:  Gait, strength, tone, movements noted in Neurologic exam below  NEUROLOGIC: MENTAL STATUS:   MMSE - Mini Mental State Exam 09/04/2016  Orientation to time 5  Orientation to Place 4  Registration 3  Attention/ Calculation 5  Recall 3  Language- name 2 objects 2  Language- repeat 1  Language- follow 3 step command 3  Language- read & follow direction 1  Write a sentence 1  Copy design 1  Total score 29    awake, alert, oriented to person, place and time  recent and remote memory intact  normal attention and concentration  language fluent, comprehension intact, naming intact,   fund of knowledge appropriate  TANGENTIAL   CRANIAL NERVE:   2nd - no papilledema  on fundoscopic exam  2nd, 3rd, 4th, 6th - pupils equal and reactive to light, visual fields full to confrontation, extraocular muscles intact, no nystagmus  5th - facial sensation symmetric  7th - facial strength symmetric  8th - hearing intact  9th - palate elevates symmetrically, uvula midline  11th - shoulder shrug symmetric  12th - tongue protrusion midline  MOTOR:   normal bulk and tone, full strength in the BUE, BLE  SENSORY:   normal and symmetric to light touch, temperature  DECR VIBRATION AT TOES  COORDINATION:   finger-nose-finger, fine finger movements normal  REFLEXES:   deep tendon reflexes TRACE and symmetric  GAIT/STATION:   narrow based gait    DIAGNOSTIC DATA (LABS, IMAGING, TESTING) - I reviewed patient records, labs, notes, testing and imaging myself where available.  Lab Results  Component Value Date   WBC 8.2 04/08/2016   HGB 12.8 04/08/2016   HCT 37.9 04/08/2016   MCV 79.6 04/08/2016   PLT 377 04/08/2016      Component Value Date/Time   NA 137 04/08/2016 0746   K 3.9 04/08/2016 0746   CL 100 (L) 04/08/2016 0746   CO2 24 04/08/2016 0746   GLUCOSE 329 (H) 04/08/2016 0746   BUN 17 04/08/2016 0746   CREATININE 1.11 (H) 04/08/2016 0746   CALCIUM 9.4 04/08/2016 0746   PROT 7.9 11/23/2010 1312   ALBUMIN 3.4 (L) 11/23/2010 1312   AST 18 11/23/2010  1312   ALT 20 11/23/2010 1312   ALKPHOS 108 11/23/2010 1312   BILITOT 0.3 11/23/2010 1312   GFRNONAA 45 (L) 04/08/2016 0746   GFRAA 53 (L) 04/08/2016 0746   Lab Results  Component Value Date   CHOL 145 04/08/2016   HDL 54 04/08/2016   LDLCALC 76 04/08/2016   TRIG 73 04/08/2016   CHOLHDL 2.7 04/08/2016   Lab Results  Component Value Date   HGBA1C 10.8 (H) 04/08/2016   Lab Results  Component Value Date   VITAMINB12 303 08/10/2008   Lab Results  Component Value Date   TSH 2.16 08/10/2008    04/09/16 MRI / MRA head [I reviewed images myself and agree with interpretation. -VRP]  - Atrophy and small vessel disease.  No acute intracranial findings. - Intracranial atherosclerotic disease affecting the distal MCA and PCA branches, but no proximal vascular stenosis or occlusion. - Partial empty sella.  04/08/16 TTE - Left ventricle: The cavity size was normal. Wall thickness was   increased in a pattern of mild LVH. Systolic function was mildly   to moderately reduced. The estimated ejection fraction was in the   range of 40% to 45%. Diffuse hypokinesis. - Aortic valve: Transvalvular velocity was within the normal range.   There was no stenosis. There was mild regurgitation. - Mitral valve: Transvalvular velocity was within the normal range.   There was no evidence for stenosis. There was trivial   regurgitation. - Right ventricle: The cavity size was normal. Wall thickness was   normal. Systolic function was mildly reduced. - Atrial septum: No defect or patent foramen ovale was identified   by color flow Doppler. - Tricuspid valve: Transvalvular velocity was within the normal   range. There was mild regurgitation. - Pulmonary arteries: Systolic pressure was within the normal   range. PA peak pressure: 24 mm Hg (S).  04/08/16 carotid u/s - The vertebral arteries appear patent with antegrade flow. - Findings consistent with a 1-39 percent stenosis involving the   right  internal carotid artery and  the left internal carotid   artery. Very difficult exam secondary to patient body habitus,   high bifrucation and tortuous vessels.    ASSESSMENT AND PLAN  83 y.o. year old female here with transient left arm numbness and weakness lasting 2 minutes, in March 2018. Patient was admitted for TIA workup. Patient doing well since discharge.   Dx:  1. Transient cerebral ischemia, unspecified type      PLAN: - continue current medications (xarelto, BP control, DM control, statin) - follow up with PCP  Return if symptoms worsen or fail to improve, for return to PCP.    Penni Bombard, MD 3/73/4287, 6:81 AM Certified in Neurology, Neurophysiology and Neuroimaging  [facility] [address] [place], [place] [address] [phone]

## 2016-10-10 ENCOUNTER — Ambulatory Visit (HOSPITAL_COMMUNITY)
Admission: EM | Admit: 2016-10-10 | Discharge: 2016-10-10 | Disposition: A | Discharge status: Home / Self Care | Payer: Medicare HMO | Attending: Family Medicine | Admitting: Family Medicine

## 2016-10-10 ENCOUNTER — Encounter (HOSPITAL_COMMUNITY): Payer: Self-pay | Admitting: Emergency Medicine

## 2016-10-10 DIAGNOSIS — H9110 Presbycusis, unspecified ear: Secondary | ICD-10-CM | POA: Diagnosis not present

## 2016-10-10 DIAGNOSIS — B356 Tinea cruris: Secondary | ICD-10-CM

## 2016-10-10 MED ORDER — KETOCONAZOLE 2 % EX CREA: 1.0000 "application " | TOPICAL_CREAM | Freq: Two times a day (BID) | CUTANEOUS | 1 refills | Status: DC

## 2016-10-10 NOTE — ED Triage Notes (Signed)
Rash under both breasts.  Rash has been there 2-3 days.  Rash is sore and red.  Reports she sweats alot

## 2016-10-10 NOTE — ED Provider Notes (Signed)
[place]    CSN: [id number] Arrival date & time: 10/10/16  1049     History   Chief Complaint Chief Complaint  Patient presents with  . Rash    HPI [name] is a 83 y.o. female.   Rash under both breasts.  Rash has been there 2-3 days.  Rash is sore and red.  Reports she sweats a lot  She is also getting hard of hearing for 6 months.  I offered a referral but she is going with her insurance company instructions.      Past Medical History:  Diagnosis Date  . ANEMIA, IRON DEFICIENCY 08/11/2008  . ANGIOEDEMA 03/01/2008  . CEREBROVASCULAR ACCIDENT, HX OF 03/01/2008  . CONTACT DERMATITIS&OTHER ECZEMA DUE UNSPEC CAUSE 11/28/2008  . DIABETES MELLITUS 03/01/2008  . Dysuria 01/04/2010  . GERD 03/01/2008  . HYPERSOMNIA 05/30/2009  . HYPERTENSION 03/01/2008  . INTERNAL HEMORRHOIDS 03/01/2008  . KNEE PAIN, RIGHT 09/05/2009  . NONSPECIFIC ABN FINDNG RAD&OTH EXAM BILARY TRCT 03/07/2008  . OBESITY 01/10/2010  . PERIPHERAL EDEMA 08/10/2008  . POLYP, GALLBLADDER 03/07/2008  . SMALL BOWEL OBSTRUCTION, HX OF 03/01/2008  . SNORING 05/11/2009  . Stroke ([place]) 04/08/2016   TIA  . THROMBOCYTOSIS 08/18/2008    Patient Active Problem List   Diagnosis Date Noted  . TIA (transient ischemic attack) 04/08/2016  . Hypocalcemia 04/08/2016  . OBESITY 01/10/2010  . DYSURIA 01/04/2010  . KNEE PAIN, RIGHT 09/05/2009  . HYPERSOMNIA 05/30/2009  . URI 05/11/2009  . SNORING 05/11/2009  . CONTACT DERMATITIS&OTHER ECZEMA DUE UNSPEC CAUSE 11/28/2008  . THROMBOCYTOSIS 08/18/2008  . ANEMIA, IRON DEFICIENCY 08/11/2008  . PERIPHERAL EDEMA 08/10/2008  . POLYP, GALLBLADDER 03/07/2008  . NONSPECIFIC ABN FINDNG RAD&OTH EXAM BILARY TRCT 03/07/2008  . Diabetes mellitus type II, uncontrolled ([place]) 03/01/2008  . Essential hypertension 03/01/2008  . INTERNAL HEMORRHOIDS 03/01/2008  . GERD 03/01/2008  . ANGIOEDEMA 03/01/2008  . CEREBROVASCULAR ACCIDENT, HX OF 03/01/2008  . SMALL BOWEL OBSTRUCTION, HX OF  03/01/2008    Past Surgical History:  Procedure Laterality Date  . HERNIA REPAIR  09/14/08   with release [facility]-Dr. Dalbert Batman  . SMALL INTESTINE SURGERY    . 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 Authorizing Provider  cloNIDine (CATAPRES) 0.2 MG tablet Take 0.2 mg by mouth 3 (three) times daily.     [provider]  Cranberry (SM CRANBERRY) 300 MG tablet Take 300 mg by mouth 2 (two) times daily as needed (for urinary issues).     [provider]  diltiazem (CARTIA XT) 300 MG 24 hr capsule Take 300 mg by mouth daily.    [provider]  feeding supplement, GLUCERNA SHAKE, (GLUCERNA SHAKE) LIQD Take 237 mLs by mouth 2 (two) times daily.    [provider]  insulin aspart (NOVOLOG) 100 UNIT/ML injection Inject 0-10 Units into the skin See admin instructions. 2 times a day per sliding scale before meals (breakfast and evening meal/dinner) and take nothing if BGL is 136 or less    [provider]  insulin detemir (LEVEMIR) 100 UNIT/ML injection Inject 20 Units into the skin at bedtime.    [provider]  ketoconazole (NIZORAL) 2 % cream Apply 1 application topically 2 (two) times daily. 10/10/16  Robyn Haber, MD  losartan (COZAAR) 100 MG tablet Take 100 mg by mouth daily.    [provider]  metFORMIN (GLUCOPHAGE) 850 MG tablet Take 850 mg by mouth 2 (two) times daily.     [provider]  Multiple Vitamin (MULTIVITAMIN) tablet Take 1 tablet by mouth every morning.     [provider]  olmesartan (BENICAR) 40 MG tablet Take 40 mg by mouth every morning.    [provider]  omeprazole (PRILOSEC OTC) 20 MG tablet Take 20 mg by mouth daily.    [provider]  polycarbophil (FIBERCON) 625 MG tablet Take 625 mg by  mouth daily as needed for mild constipation.    [provider]  Probiotic Product (PROBIOTIC FORMULA PO) Take 1 tablet by mouth daily.     [provider]  Rivaroxaban (XARELTO) 20 MG TABS tablet Take 20 mg by mouth daily with supper.    [provider]    Family History Family History  Problem Relation Age of Onset  . Diabetes Mother   . Heart disease Mother   . Diabetes Sister   . Heart disease Sister   . Colitis Sister   . Kidney disease Brother   . Breast cancer Other        Neice  . Allergies Other        Nephrew  . Asthma Other        Nephrew    Social History Social History  Substance Use Topics  . Smoking status: Never Smoker  . Smokeless tobacco: Never Used  . Alcohol use No     Allergies   Ace inhibitors; Prednisone; Penicillins; Sulfamethoxazole; and Sulfonamide derivatives   Review of Systems Review of Systems  Constitutional: Negative.   HENT: Positive for hearing loss.   Skin: Positive for rash.     Physical Exam Triage Vital Signs ED Triage Vitals [10/10/16 1158]  Enc Vitals Group     BP      Pulse      Resp      Temp      Temp src      SpO2      Weight      Height      Head Circumference      Peak Flow      Pain Score 3     Pain Loc      Pain Edu?      Excl. in [place]?    No data found.   Updated Vital Signs BP (!) 148/78 (BP Location: Right Arm) Comment (BP Location): regular cuff on right forearm  Pulse 68   Temp 98 F (36.7 C) (Oral)   Resp (!) 22   SpO2 99%       Physical Exam  Constitutional: She is oriented to person, place, and time. She appears well-developed and well-nourished.  HENT:  Right Ear: External ear normal.  Left Ear: External ear normal.  Ear canals are clear and TMs appear normal  Eyes: Pupils are equal, round, and reactive to light. Conjunctivae are normal.  Neck: Normal range of motion. Neck supple.  Pulmonary/Chest: Effort normal.  Musculoskeletal: Normal range of  motion.  Neurological: She is alert and oriented to person, place, and time.  Skin: Skin is warm and dry.  Hyperpigmented well-defined rash in the inframammary skin fold  Nursing note and vitals reviewed.    UC Treatments / Results  Labs (all labs ordered are listed, but only abnormal results are  displayed) Labs Reviewed - No data to display  EKG  EKG Interpretation None       Radiology No results found.  Procedures Procedures (including critical care time)  Medications Ordered in UC Medications - No data to display   Initial Impression / Assessment and Plan / UC Course  I have reviewed the triage vital signs and the nursing notes.  Pertinent labs & imaging results that were available during my care of the patient were reviewed by me and considered in my medical decision making (see chart for details).     Final Clinical Impressions(s) / UC Diagnoses   Final diagnoses:  Tinea cruris  Presbycusis, unspecified laterality    New Prescriptions New Prescriptions   KETOCONAZOLE (NIZORAL) 2 % CREAM    Apply 1 application topically 2 (two) times daily.     Controlled Substance Prescriptions [place] Controlled Substance Registry consulted? Not Applicable   Robyn Haber, MD 10/10/16 1216

## 2016-10-28 DIAGNOSIS — E119 Type 2 diabetes mellitus without complications: Secondary | ICD-10-CM | POA: Diagnosis not present

## 2016-10-28 DIAGNOSIS — I10 Essential (primary) hypertension: Secondary | ICD-10-CM | POA: Diagnosis not present

## 2016-10-28 DIAGNOSIS — E785 Hyperlipidemia, unspecified: Secondary | ICD-10-CM | POA: Diagnosis not present

## 2016-10-31 DIAGNOSIS — I480 Paroxysmal atrial fibrillation: Secondary | ICD-10-CM | POA: Diagnosis not present

## 2016-10-31 DIAGNOSIS — I10 Essential (primary) hypertension: Secondary | ICD-10-CM | POA: Diagnosis not present

## 2016-10-31 DIAGNOSIS — E6609 Other obesity due to excess calories: Secondary | ICD-10-CM | POA: Diagnosis not present

## 2016-10-31 DIAGNOSIS — M1990 Unspecified osteoarthritis, unspecified site: Secondary | ICD-10-CM | POA: Diagnosis not present

## 2016-10-31 DIAGNOSIS — E119 Type 2 diabetes mellitus without complications: Secondary | ICD-10-CM | POA: Diagnosis not present

## 2016-10-31 DIAGNOSIS — E785 Hyperlipidemia, unspecified: Secondary | ICD-10-CM | POA: Diagnosis not present

## 2016-10-31 DIAGNOSIS — Z6836 Body mass index (BMI) 36.0-36.9, adult: Secondary | ICD-10-CM | POA: Diagnosis not present

## 2017-01-21 DIAGNOSIS — I4891 Unspecified atrial fibrillation: Secondary | ICD-10-CM

## 2017-01-21 HISTORY — DX: Unspecified atrial fibrillation: I48.91

## 2017-01-30 DIAGNOSIS — E119 Type 2 diabetes mellitus without complications: Secondary | ICD-10-CM | POA: Diagnosis not present

## 2017-01-30 DIAGNOSIS — I10 Essential (primary) hypertension: Secondary | ICD-10-CM | POA: Diagnosis not present

## 2017-01-30 DIAGNOSIS — M1990 Unspecified osteoarthritis, unspecified site: Secondary | ICD-10-CM | POA: Diagnosis not present

## 2017-01-30 DIAGNOSIS — I480 Paroxysmal atrial fibrillation: Secondary | ICD-10-CM | POA: Diagnosis not present

## 2017-01-30 DIAGNOSIS — E785 Hyperlipidemia, unspecified: Secondary | ICD-10-CM | POA: Diagnosis not present

## 2017-02-26 ENCOUNTER — Other Ambulatory Visit: Payer: Self-pay

## 2017-02-26 ENCOUNTER — Encounter (HOSPITAL_COMMUNITY): Payer: Self-pay | Admitting: Emergency Medicine

## 2017-02-26 ENCOUNTER — Ambulatory Visit (HOSPITAL_COMMUNITY)
Admission: EM | Admit: 2017-02-26 | Discharge: 2017-02-26 | Disposition: A | Discharge status: Home / Self Care | Payer: Medicare HMO | Attending: Family Medicine | Admitting: Family Medicine

## 2017-02-26 DIAGNOSIS — H60501 Unspecified acute noninfective otitis externa, right ear: Secondary | ICD-10-CM

## 2017-02-26 MED ORDER — CLINDAMYCIN HCL 150 MG PO CAPS: 150.0000 mg | ORAL_CAPSULE | Freq: Three times a day (TID) | ORAL | 0 refills | Status: DC

## 2017-02-26 NOTE — ED Triage Notes (Signed)
Pt c/o ear pain on the R side for one week

## 2017-02-27 NOTE — ED Provider Notes (Signed)
[place]   [id number] 02/26/17 Arrival Time: 5573  ASSESSMENT & PLAN:  1. Acute otitis externa of right ear, unspecified type     Meds ordered this encounter  Medications  . clindamycin (CLEOCIN) 150 MG capsule    Sig: Take 1 capsule (150 mg total) by mouth 3 (three) times daily.    Dispense:  30 capsule    Refill:  0   Too much wax in ear for ear drops. Likely could not tolerate ear flushing today. Will hold off on using L hearing aid for a few days. May f/u with PCP or here as needed if not improving over the next few days.  Reviewed expectations re: course of current medical issues. Questions answered. Outlined signs and symptoms indicating need for more acute intervention. Patient verbalized understanding. After Visit Summary given.   SUBJECTIVE: History from: patient.  [name] is a 84 y.o. female who presents with complaint of right otalgia without drainage. Onset gradual, approximately 1 week ago. Recent cold symptoms: none. Fever: no. Overall normal PO intake without n/v. Sick contacts: no. OTC treatment: None. "Ear just feels sore." Questions if related to hearing aid use.  Social History   Tobacco Use  Smoking Status Never Smoker  Smokeless Tobacco Never Used    ROS: As per HPI.   OBJECTIVE:  Vitals:   02/26/17 1947  BP: (!) 188/101  Pulse: 94  Resp: 20  Temp: 98 F (36.7 C)  TempSrc: Oral  SpO2: 96%     General appearance: alert; appears fatigued Ear Canal: edema and inflammation on the right; much cerumen TM: difficult to visualize secondary to ear canal inflammation and cerumen Neck: supple without LAD Lungs: unlabored respirations, symmetrical air entry; cough: absent; no respiratory distress Skin: warm and dry Psychological: alert and cooperative; normal mood and affect  Allergies  Allergen Reactions  . Ace Inhibitors Swelling    Angioedema   . Prednisone Nausea And Vomiting, Other (See Comments) and Hypertension   Blood sugar increased (also)  . Penicillins Rash    Has patient had a PCN reaction causing immediate rash, facial/tongue/throat swelling, SOB or lightheadedness with hypotension: Yes Has patient had a PCN reaction causing severe rash involving mucus membranes or skin necrosis: No Has patient had a PCN reaction that required hospitalization: No Has patient had a PCN reaction occurring within the last 10 years: No If all of the above answers are "NO", then may proceed with Cephalosporin use.   . Sulfamethoxazole Rash  . Sulfonamide Derivatives Rash    Past Medical History:  Diagnosis Date  . ANEMIA, IRON DEFICIENCY 08/11/2008  . ANGIOEDEMA 03/01/2008  . CEREBROVASCULAR ACCIDENT, HX OF 03/01/2008  . CONTACT DERMATITIS&OTHER ECZEMA DUE UNSPEC CAUSE 11/28/2008  . DIABETES MELLITUS 03/01/2008  . Dysuria 01/04/2010  . GERD 03/01/2008  . HYPERSOMNIA 05/30/2009  . HYPERTENSION 03/01/2008  . INTERNAL HEMORRHOIDS 03/01/2008  . KNEE PAIN, RIGHT 09/05/2009  . NONSPECIFIC ABN FINDNG RAD&OTH EXAM BILARY TRCT 03/07/2008  . OBESITY 01/10/2010  . PERIPHERAL EDEMA 08/10/2008  . POLYP, GALLBLADDER 03/07/2008  . SMALL BOWEL OBSTRUCTION, HX OF 03/01/2008  . SNORING 05/11/2009  . Stroke ([place]) 04/08/2016   TIA  . THROMBOCYTOSIS 08/18/2008   Family History  Problem Relation Age of Onset  . Diabetes Mother   . Heart disease Mother   . Diabetes Sister   . Heart disease Sister   . Colitis Sister   . Kidney disease Brother   . Breast cancer Other  Neice  . Allergies Other        Nephrew  . Asthma Other        Nephrew   Social History   Socioeconomic History  . Marital status: Divorced    Spouse name: Not on file  . Number of children: 5  . Years of education: 34  . Highest education level: Not on file  Social Needs  . Financial resource strain: Not on file  . Food insecurity - worry: Not on file  . Food insecurity - inability: Not on file  . Transportation needs - medical: Not on file  .  Transportation needs - non-medical: Not on file  Occupational History    Comment: counselor  Tobacco Use  . Smoking status: Never Smoker  . Smokeless tobacco: Never Used  Substance and Sexual Activity  . Alcohol use: No  . Drug use: No  . Sexual activity: Not on file  Other Topics Concern  . Not on file  Social History Narrative   Lives with daughter, Genia Del, MD 02/27/17 [phone]

## 2017-04-02 DIAGNOSIS — M1990 Unspecified osteoarthritis, unspecified site: Secondary | ICD-10-CM | POA: Diagnosis not present

## 2017-04-02 DIAGNOSIS — E785 Hyperlipidemia, unspecified: Secondary | ICD-10-CM | POA: Diagnosis not present

## 2017-04-02 DIAGNOSIS — I10 Essential (primary) hypertension: Secondary | ICD-10-CM | POA: Diagnosis not present

## 2017-04-02 DIAGNOSIS — I4891 Unspecified atrial fibrillation: Secondary | ICD-10-CM | POA: Diagnosis not present

## 2017-04-02 DIAGNOSIS — E119 Type 2 diabetes mellitus without complications: Secondary | ICD-10-CM | POA: Diagnosis not present

## 2017-05-06 DIAGNOSIS — I10 Essential (primary) hypertension: Secondary | ICD-10-CM | POA: Diagnosis not present

## 2017-05-06 DIAGNOSIS — E119 Type 2 diabetes mellitus without complications: Secondary | ICD-10-CM | POA: Diagnosis not present

## 2017-05-06 DIAGNOSIS — I480 Paroxysmal atrial fibrillation: Secondary | ICD-10-CM | POA: Diagnosis not present

## 2017-05-06 DIAGNOSIS — E785 Hyperlipidemia, unspecified: Secondary | ICD-10-CM | POA: Diagnosis not present

## 2017-05-06 DIAGNOSIS — I011 Acute rheumatic endocarditis: Secondary | ICD-10-CM | POA: Diagnosis not present

## 2017-05-07 DIAGNOSIS — R011 Cardiac murmur, unspecified: Secondary | ICD-10-CM | POA: Diagnosis not present

## 2017-05-07 DIAGNOSIS — E785 Hyperlipidemia, unspecified: Secondary | ICD-10-CM | POA: Diagnosis not present

## 2017-05-07 DIAGNOSIS — I480 Paroxysmal atrial fibrillation: Secondary | ICD-10-CM | POA: Diagnosis not present

## 2017-05-07 DIAGNOSIS — E119 Type 2 diabetes mellitus without complications: Secondary | ICD-10-CM | POA: Diagnosis not present

## 2017-05-07 DIAGNOSIS — I10 Essential (primary) hypertension: Secondary | ICD-10-CM | POA: Diagnosis not present

## 2017-05-08 DIAGNOSIS — I10 Essential (primary) hypertension: Secondary | ICD-10-CM | POA: Diagnosis not present

## 2017-05-08 DIAGNOSIS — E119 Type 2 diabetes mellitus without complications: Secondary | ICD-10-CM | POA: Diagnosis not present

## 2017-06-04 ENCOUNTER — Emergency Department: Payer: Medicare Other

## 2017-06-04 ENCOUNTER — Observation Stay
Admission: EM | Admit: 2017-06-04 | Discharge: 2017-06-05 | Disposition: A | Discharge status: Home / Self Care | Payer: Medicare Other | Attending: Internal Medicine | Admitting: Internal Medicine

## 2017-06-04 DIAGNOSIS — I440 Atrioventricular block, first degree: Secondary | ICD-10-CM | POA: Insufficient documentation

## 2017-06-04 DIAGNOSIS — Z8249 Family history of ischemic heart disease and other diseases of the circulatory system: Secondary | ICD-10-CM | POA: Insufficient documentation

## 2017-06-04 DIAGNOSIS — I959 Hypotension, unspecified: Secondary | ICD-10-CM | POA: Insufficient documentation

## 2017-06-04 DIAGNOSIS — Z880 Allergy status to penicillin: Secondary | ICD-10-CM | POA: Insufficient documentation

## 2017-06-04 DIAGNOSIS — I10 Essential (primary) hypertension: Secondary | ICD-10-CM | POA: Insufficient documentation

## 2017-06-04 DIAGNOSIS — E119 Type 2 diabetes mellitus without complications: Secondary | ICD-10-CM | POA: Insufficient documentation

## 2017-06-04 DIAGNOSIS — R42 Dizziness and giddiness: Principal | ICD-10-CM | POA: Insufficient documentation

## 2017-06-04 DIAGNOSIS — Z888 Allergy status to other drugs, medicaments and biological substances status: Secondary | ICD-10-CM | POA: Insufficient documentation

## 2017-06-04 DIAGNOSIS — R0602 Shortness of breath: Secondary | ICD-10-CM | POA: Insufficient documentation

## 2017-06-04 DIAGNOSIS — Z882 Allergy status to sulfonamides status: Secondary | ICD-10-CM | POA: Insufficient documentation

## 2017-06-04 DIAGNOSIS — R000 Tachycardia, unspecified: Secondary | ICD-10-CM | POA: Insufficient documentation

## 2017-06-04 DIAGNOSIS — Z794 Long term (current) use of insulin: Secondary | ICD-10-CM | POA: Insufficient documentation

## 2017-06-04 DIAGNOSIS — Z7901 Long term (current) use of anticoagulants: Secondary | ICD-10-CM | POA: Insufficient documentation

## 2017-06-04 DIAGNOSIS — R002 Palpitations: Secondary | ICD-10-CM | POA: Insufficient documentation

## 2017-06-04 DIAGNOSIS — R001 Bradycardia, unspecified: Secondary | ICD-10-CM | POA: Insufficient documentation

## 2017-06-04 DIAGNOSIS — I480 Paroxysmal atrial fibrillation: Secondary | ICD-10-CM | POA: Insufficient documentation

## 2017-06-04 LAB — CBC AND DIFFERENTIAL
Absolute NRBC: 0 10*3/uL (ref 0.00–0.00)
Basophils Absolute Automated: 0.08 10*3/uL (ref 0.00–0.08)
Basophils Automated: 0.6 %
Eosinophils Absolute Automated: 0.47 10*3/uL — ABNORMAL HIGH (ref 0.00–0.44)
Eosinophils Automated: 3.5 %
Hematocrit: 36.8 % (ref 34.7–43.7)
Hgb: 11.8 g/dL (ref 11.4–14.8)
Immature Granulocytes Absolute: 0.05 10*3/uL (ref 0.00–0.07)
Immature Granulocytes: 0.4 %
Lymphocytes Absolute Automated: 6.39 10*3/uL — ABNORMAL HIGH (ref 0.42–3.22)
Lymphocytes Automated: 48.1 %
MCH: 26 pg (ref 25.1–33.5)
MCHC: 32.1 g/dL (ref 31.5–35.8)
MCV: 81.2 fL (ref 78.0–96.0)
MPV: 10.5 fL (ref 8.9–12.5)
Monocytes Absolute Automated: 1.2 10*3/uL — ABNORMAL HIGH (ref 0.21–0.85)
Monocytes: 9 %
Neutrophils Absolute: 5.09 10*3/uL (ref 1.10–6.33)
Neutrophils: 38.4 %
Nucleated RBC: 0 /100 WBC (ref 0.0–0.0)
Platelets: 364 10*3/uL — ABNORMAL HIGH (ref 142–346)
RBC: 4.53 10*6/uL (ref 3.90–5.10)
RDW: 17 % — ABNORMAL HIGH (ref 11–15)
WBC: 13.28 10*3/uL — ABNORMAL HIGH (ref 3.10–9.50)

## 2017-06-04 LAB — URINALYSIS, REFLEX TO MICROSCOPIC EXAM IF INDICATED
Bilirubin, UA: NEGATIVE
Blood, UA: NEGATIVE
Glucose, UA: NEGATIVE
Ketones UA: NEGATIVE
Leukocyte Esterase, UA: NEGATIVE
Nitrite, UA: NEGATIVE
Protein, UR: NEGATIVE
Specific Gravity UA: 1.005 (ref 1.001–1.035)
Urine pH: 7 (ref 5.0–8.0)
Urobilinogen, UA: NEGATIVE mg/dL

## 2017-06-04 LAB — PT/INR
PT INR: 1.3 — ABNORMAL HIGH (ref 0.9–1.1)
PT: 16.4 s — ABNORMAL HIGH (ref 12.6–15.0)

## 2017-06-04 LAB — BASIC METABOLIC PANEL
Anion Gap: 13 (ref 5.0–15.0)
BUN: 27 mg/dL — ABNORMAL HIGH (ref 7–19)
CO2: 22 mEq/L (ref 22–29)
Calcium: 10 mg/dL (ref 7.9–10.2)
Chloride: 100 mEq/L (ref 100–111)
Creatinine: 1.6 mg/dL — ABNORMAL HIGH (ref 0.6–1.0)
Glucose: 198 mg/dL — ABNORMAL HIGH (ref 70–100)
Potassium: 5 mEq/L (ref 3.5–5.1)
Sodium: 135 mEq/L — ABNORMAL LOW (ref 136–145)

## 2017-06-04 LAB — TSH: TSH: 1.7 u[IU]/mL (ref 0.35–4.94)

## 2017-06-04 LAB — GFR: EGFR: 37.3

## 2017-06-04 LAB — B-TYPE NATRIURETIC PEPTIDE: B-Natriuretic Peptide: 195 pg/mL — ABNORMAL HIGH (ref 0–100)

## 2017-06-04 LAB — TROPONIN I: Troponin I: <0.01 ng/mL (ref 0.00–0.09)

## 2017-06-04 LAB — MAGNESIUM: Magnesium: 1.4 mg/dL — ABNORMAL LOW (ref 1.6–2.6)

## 2017-06-04 MED ORDER — SODIUM CHLORIDE 0.9 % IV BOLUS
1000.00 mL | Freq: Once | INTRAVENOUS | Status: AC
Start: 2017-06-04 — End: 2017-06-05
  Administered 2017-06-04: 23:00:00 1000 mL via INTRAVENOUS

## 2017-06-04 MED ORDER — MAGNESIUM SULFATE IN D5W 1-5 GM/100ML-% IV SOLN
1.0000 g | INTRAVENOUS | Status: AC
Start: 2017-06-04 — End: 2017-06-05
  Administered 2017-06-04 – 2017-06-05 (×2): 1 g via INTRAVENOUS
  Filled 2017-06-04 (×2): qty 100

## 2017-06-04 MED ORDER — LABETALOL HCL 5 MG/ML IV SOLN (WRAP)
10.00 mg | Freq: Once | INTRAVENOUS | Status: DC
Start: 2017-06-04 — End: 2017-06-05
  Filled 2017-06-04: qty 4

## 2017-06-04 NOTE — ED Provider Notes (Signed)
EMERGENCY DEPARTMENT HISTORY AND PHYSICAL EXAM    Date Time: 06/05/17 4:16 AM  Patient Name: Emily Stevens, Emily Stevens, 84 y.o., female  ED Provider: Sonda Primes, MD    History of Presenting Illness:     Chief Complaint: dizzy, palpitations  History obtained from: Patient.  Onset/Duration: this evening  Quality: feels lightheaded, low pulse  Severity:moderate  Aggravating Factors: none  Alleviating Factors: none  Associated Symptoms: low blood sugar  Narrative/Additional Historical Findings:Emily Stevens is a 84 y.o. female  Who is pw above cc.    She reports she could not get her blood pressure to come up on the machine at home.    She ate some food, but still feels dizzy, and has palpitations.    Nursing notes from this date of service were reviewed.    Past Medical History:     Past Medical History:   Diagnosis Date   . Angioedema    . Diabetes mellitus    . Hypertension        Past Surgical History:   History reviewed. No pertinent surgical history.    Family History:   History reviewed. No pertinent family history.    Social History:     Social History     Social History   . Marital status: Divorced     Spouse name: N/A   . Number of children: N/A   . Years of education: N/A     Social History Main Topics   . Smoking status: Never Smoker   . Smokeless tobacco: Never Used   . Alcohol use No   . Drug use: No   . Sexual activity: Not on file     Other Topics Concern   . Not on file     Social History Narrative   . No narrative on file       Allergies:     Allergies   Allergen Reactions   . Ace Inhibitors    . Penicillins    . Prednisolone    . Sulfa Antibiotics        Medications:     Current Facility-Administered Medications:   .  0.45% NaCl infusion, , Intravenous, Continuous, Bolad, Aladdin A, MD  .  cloNIDine (CATAPRES) tablet 0.2 mg, 0.2 mg, Oral, BID, Bolad, Aladdin A, MD  .  Nursing communication: Adult Hypoglycemia Treatment Algorithm, , , Until Discontinued **AND** dextrose (GLUCOSE) 40 % oral gel 15 g of glucose,  15 g of glucose, Oral, PRN **AND** dextrose 50 % bolus 12.5 g, 12.5 g, Intravenous, PRN **AND** glucagon (rDNA) (GLUCAGEN) injection 1 mg, 1 mg, Intramuscular, PRN, Bolad, Aladdin A, MD  .  heparin (porcine) injection 5,000 Units, 5,000 Units, Subcutaneous, Q12H SCH, Bolad, Aladdin A, MD  .  insulin lispro (HumaLOG) injection 1-3 Units, 1-3 Units, Subcutaneous, QHS **AND** NSG Communication: Glucose POCT order, , , At bedtime, Bolad, Aladdin A, MD  .  insulin lispro (HumaLOG) injection 1-5 Units, 1-5 Units, Subcutaneous, TID AC **AND** NSG Communication: Glucose POCT order, , , AC, Bolad, Aladdin A, MD  .  pantoprazole (PROTONIX) EC tablet 40 mg, 40 mg, Oral, QAM AC, Bolad, Aladdin A, MD  .  rivaroxaban (XARELTO) tablet 20 mg, 20 mg, Oral, Daily with dinner, Bolad, Aladdin A, MD    Review of Systems:   Constitutional: No fever or change in activity.  Eyes: No eye redness. No eye discharge.  ENT: No ear pain or sore throat  Cardiovascular: positive for palpitations  Respiratory: No cough or  shortness of breath.  GI: No vomiting or diarrhea.  Genitourinary: Normal urination frequency  Musculoskeletal: No extremity pain or decreased use  Skin: no rash or skin lesions.  Neurologic: Normal level of alertness    All other systems reviewed and are negative    Physical Exam:     ED Triage Vitals [06/04/17 2102]   Enc Vitals Group      BP (!) 187/103      Heart Rate 62      Resp Rate 18      Temp 98.3 F (36.8 C)      Temp Source Oral      SpO2 95 %      Weight 83.9 kg      Height 1.575 m      Head Circumference       Peak Flow       Pain Score 0      Pain Loc       Pain Edu?       Excl. in GC?      Vital Signs: Reviewed the patient's vital signs.   Nursing Notes: Reviewed and utilized available nursing notes.  Constitutional:  Well hydrated, well perfused, and no increased work of breathing. Appearance: nad  Head:  Normocephalic, atraumatic  Eyes: No conjunctival injection. No discharge. EOMI  ENT: Mucous membranes moist,  No oral lesions.  Neck: Normal range of motion. Non-tender.  Respiratory/Chest: Clear to auscultation. No respiratory distress.   Cardiovascular: Regular rate and rhythm. No murmur.   Abdomen: Soft and non-tender. No masses or hepatosplenomegaly.  Genitourinary:  UpperExtremity: No edema or cyanosis.  Moving well.  LowerExtremity: No edema or cyanosis.  Moving well.  Neurological: No focal motor deficits by observation. Speech normal. Memory normal.  Skin: Warm and dry. No rash.  Lymphatic: No cervical lymphadenopathy.  Psychiatric: Normal affect. Normal concentration.    Labs:     Labs Reviewed   CBC AND DIFFERENTIAL - Abnormal; Notable for the following:        Result Value    WBC 13.28 (*)     Platelets 364 (*)     RDW 17 (*)     Abs Lymph Automated 6.39 (*)     Abs Mono Automated 1.20 (*)     Abs Eos Automated 0.47 (*)     All other components within normal limits   PT/INR - Abnormal; Notable for the following:     PT 16.4 (*)     PT INR 1.3 (*)     All other components within normal limits   BASIC METABOLIC PANEL - Abnormal; Notable for the following:     Glucose 198 (*)     BUN 27 (*)     Creatinine 1.6 (*)     Sodium 135 (*)     All other components within normal limits   B-TYPE NATRIURETIC PEPTIDE - Abnormal; Notable for the following:     B-Natriuretic Peptide 195 (*)     All other components within normal limits   MAGNESIUM - Abnormal; Notable for the following:     Magnesium 1.4 (*)     All other components within normal limits   TROPONIN I   TSH   URINALYSIS, REFLEX TO MICROSCOPIC EXAM IF INDICATED   GFR   TROPONIN I   BASIC METABOLIC PANEL   TROPONIN I   TROPONIN I   GFR         Rads:     Radiology Results (  24 Hour)     Procedure Component Value Units Date/Time    XR Chest  AP Portable [[id number]] Collected:  06/04/17 2245    Order Status:  Completed Updated:  06/04/17 2249    Narrative:       XR CHEST AP PORTABLE 06/04/2017 10:36 PM    Clinical history: hypotension.    Comparison:  06/20/2014    Findings: There is no infiltrate, effusion, consolidation or  pneumothorax. The cardiac size is within normal limits. Atherosclerotic  changes in the aorta.      Impression:        Stable appearance of the chest.    Joselyn Glassman, MD   06/04/2017 10:45 PM          MDM and ED Course   DR. Myndi Wamble  is the primary attending for this patient and has obtained and performed the history, PE, and medical decision making for this patient.    MDM:    2130    Will do evaluation for bradycardia/hypotension.    Patient reports that at home her BP was too low to detect on her machine.  [facility] pulse was in the fortys.  At home her blood sugar was too low.  Recently she has lost 15 lbs due to diet.  But has not adjusted diabetes medications.  EKG Interpretation:   Signed and interpreted by ED Physician   Time Interpreted: 2130  Comparison: none  Rate: 63  Rhythm: sinus   Axis: left   Blocks: incomplete RBBB  ST segments: non specific st-t changes  Interpretation: abnormal EKG      2300  Bun/Cr elevated will give IV fluids.  Mag low, will replete.    Case discussed with Dr. Kevin Fenton for admission.    differential diagnosis:  Renal failure, dehydration, ACS, heart block, electrolyte abnormality, medication overdose.      Medical Records Reviewed: Reviewed available past medical records.  Counseling: The emergency provider has spoken with the patient and discussed today's findings, in addition to providing specific details for the plan of care.  Questions are answered and there is agreement with the plan.    Assessment/Plan:   Results and instructions reviewed at the bedside with patient and family.    Clinical Impression  Final diagnoses:   Dizziness       Disposition  ED Disposition     ED Disposition Condition Date/Time Comment    Admit  Wed Jun 04, 2017 11:32 PM Admitting Physician: Susann Givens A [[id number]]   Diagnosis: Dizziness [409811]   Estimated Length of Stay: > or = to 2 midnights    Tentative Discharge Plan?: Home or Self Care [1]   Patient Class: Inpatient [101]            Prescriptions  Current Discharge Medication List              Signed by: Vito Backers, MD  06/05/17 [phone]

## 2017-06-05 DIAGNOSIS — R42 Dizziness and giddiness: Secondary | ICD-10-CM

## 2017-06-05 DIAGNOSIS — R001 Bradycardia, unspecified: Secondary | ICD-10-CM

## 2017-06-05 DIAGNOSIS — I480 Paroxysmal atrial fibrillation: Secondary | ICD-10-CM

## 2017-06-05 LAB — TROPONIN I
Troponin I: <0.01 ng/mL (ref 0.00–0.09)
Troponin I: <0.01 ng/mL (ref 0.00–0.09)

## 2017-06-05 LAB — BASIC METABOLIC PANEL
Anion Gap: 11 (ref 5.0–15.0)
BUN: 24 mg/dL — ABNORMAL HIGH (ref 7–19)
CO2: 22 mEq/L (ref 22–29)
Calcium: 10 mg/dL (ref 7.9–10.2)
Chloride: 100 mEq/L (ref 100–111)
Creatinine: 1.5 mg/dL — ABNORMAL HIGH (ref 0.6–1.0)
Glucose: 309 mg/dL — ABNORMAL HIGH (ref 70–100)
Potassium: 4.8 mEq/L (ref 3.5–5.1)
Sodium: 133 mEq/L — ABNORMAL LOW (ref 136–145)

## 2017-06-05 LAB — GLUCOSE WHOLE BLOOD - POCT
Whole Blood Glucose POCT: 264 mg/dL — ABNORMAL HIGH (ref 70–100)
Whole Blood Glucose POCT: 285 mg/dL — ABNORMAL HIGH (ref 70–100)

## 2017-06-05 LAB — GFR: EGFR: 40.2

## 2017-06-05 MED ORDER — INSULIN LISPRO 100 UNIT/ML SC SOLN
1.00 [IU] | Freq: Three times a day (TID) | SUBCUTANEOUS | Status: DC
Start: 2017-06-05 — End: 2017-06-05
  Administered 2017-06-05 (×2): 3 [IU] via SUBCUTANEOUS
  Filled 2017-06-05 (×2): qty 9

## 2017-06-05 MED ORDER — HEPARIN SODIUM (PORCINE) 5000 UNIT/ML IJ SOLN
5000.00 [IU] | Freq: Two times a day (BID) | INTRAMUSCULAR | Status: DC
Start: 2017-06-05 — End: 2017-06-05

## 2017-06-05 MED ORDER — SODIUM CHLORIDE 0.45 % IV SOLN
INTRAVENOUS | Status: DC
Start: 2017-06-05 — End: 2017-06-05
  Administered 2017-06-05: 05:00:00 75 mL/h via INTRAVENOUS

## 2017-06-05 MED ORDER — RIVAROXABAN 20 MG PO TABS
20.00 mg | ORAL_TABLET | Freq: Every day | ORAL | Status: DC
Start: 2017-06-05 — End: 2017-06-05

## 2017-06-05 MED ORDER — DILTIAZEM HCL 30 MG PO TABS
60.00 mg | ORAL_TABLET | Freq: Four times a day (QID) | ORAL | Status: DC
Start: 2017-06-05 — End: 2017-06-05
  Administered 2017-06-05: 13:00:00 60 mg via ORAL
  Filled 2017-06-05: qty 2

## 2017-06-05 MED ORDER — RIVAROXABAN 15 MG PO TABS
15.00 mg | ORAL_TABLET | Freq: Every day | ORAL | Status: DC
Start: 2017-06-05 — End: 2017-06-05

## 2017-06-05 MED ORDER — PANTOPRAZOLE SODIUM 40 MG PO TBEC
40.00 mg | DELAYED_RELEASE_TABLET | Freq: Every morning | ORAL | Status: DC
Start: 2017-06-05 — End: 2017-06-05
  Administered 2017-06-05: 08:00:00 40 mg via ORAL
  Filled 2017-06-05: qty 1

## 2017-06-05 MED ORDER — DEXTROSE 50 % IV SOLN
12.50 g | INTRAVENOUS | Status: DC | PRN
Start: 2017-06-05 — End: 2017-06-05

## 2017-06-05 MED ORDER — GLUCAGON 1 MG IJ SOLR (WRAP)
1.00 mg | INTRAMUSCULAR | Status: DC | PRN
Start: 2017-06-05 — End: 2017-06-05

## 2017-06-05 MED ORDER — CLONIDINE HCL 0.1 MG PO TABS
0.20 mg | ORAL_TABLET | Freq: Two times a day (BID) | ORAL | Status: DC
Start: 2017-06-05 — End: 2017-06-05
  Administered 2017-06-05: 10:00:00 0.2 mg via ORAL
  Filled 2017-06-05: qty 2

## 2017-06-05 MED ORDER — INSULIN LISPRO 100 UNIT/ML SC SOLN
1.00 [IU] | Freq: Every evening | SUBCUTANEOUS | Status: DC
Start: 2017-06-05 — End: 2017-06-05

## 2017-06-05 MED ORDER — GLUCOSE 40 % PO GEL
15.00 g | ORAL | Status: DC | PRN
Start: 2017-06-05 — End: 2017-06-05

## 2017-06-05 MED ORDER — DILTIAZEM HCL 60 MG PO TABS
60.00 mg | ORAL_TABLET | Freq: Four times a day (QID) | ORAL | 0 refills | Status: DC
Start: 2017-06-05 — End: 2021-10-22

## 2017-06-05 NOTE — Plan of Care (Signed)
Problem: Fluid and Electrolyte Imbalance/ Endocrine  Goal: Fluid and electrolyte balance are achieved/maintained  Outcome: Progressing   06/05/17 0951   Goal/Interventions addressed this shift   Fluid and electrolyte balance are achieved/maintained Monitor/assess lab values and report abnormal values;Provide adequate hydration;Monitor intake and output every shift;Observe for cardiac arrhythmias     Goal: Adequate hydration  Outcome: Progressing   06/05/17 0951   Goal/Interventions addressed this shift   Adequate hydration Monitor and assess vital signs and perfusion       Comments: Pt is not reporting any dizziness this am. SR  with 1st degree AV block on tele. On 1/2 NS IV fluids creatnine 1.5

## 2017-06-05 NOTE — Progress Notes (Signed)
06/05/17 1135 06/05/17 1137 06/05/17 1138   Vital Signs   Heart Rate 87 91 92   Resp Rate --  --  18   BP 143/68 187/84 158/75   BP Location Left forearm Left forearm Left forearm   BP Method Automatic Automatic Automatic   Patient Position Lying Sitting Sitting       06/05/17 1140   Vital Signs   Heart Rate (!) 116   Resp Rate --    BP 157/86   BP Location --    BP Method --    Patient Position Standing   no dizziness

## 2017-06-05 NOTE — Plan of Care (Signed)
Problem: Safety  Goal: Patient will be free from injury during hospitalization  Outcome: Progressing   06/05/17 0613   Goal/Interventions addressed this shift   Patient will be free from injury during hospitalization  Assess patient's risk for falls and implement fall prevention plan of care per policy;Provide and maintain safe environment;Use appropriate transfer methods;Ensure appropriate safety devices are available at the bedside;Include patient/ family/ care giver in decisions related to safety;Hourly rounding       Problem: Fluid and Electrolyte Imbalance/ Endocrine  Goal: Fluid and electrolyte balance are achieved/maintained  Outcome: Progressing   06/05/17 0613   Goal/Interventions addressed this shift   Fluid and electrolyte balance are achieved/maintained Monitor intake and output every shift;Monitor/assess lab values and report abnormal values;Provide adequate hydration;Assess for confusion/personality changes;Monitor daily weight;Assess and reassess fluid and electrolyte status;Observe for seizure activity and initiate seizure precautions if indicated;Observe for cardiac arrhythmias;Monitor for muscle weakness   Patient received from ED. Alert times four. SR with first degree AVB on tele. VSS. Oriented to room, bathroom, bed controls and call button. Verbalizes understanding.  Labs, blood sugar and vitals monitored. Fluid and electrolytes replenished as needed. IV fluids infusing per mar. Patient updated on POC. Verbalizes understanding.  Patient needs assessed and met. Will continue to monitor patient status.

## 2017-06-05 NOTE — Consults (Signed)
Cardiology Consult note  Cardiology Consult    Date/Time:  06/05/2017 9:33 AM  Patient Name:  Emily Stevens  DoB:  06/08/1932  MRN:  [id number]  Room#: LK440/NU272-53    Chief Complaint:    Chief Complaint   Patient presents with   . Hypotension   . Bradycardia       Reason For Consult:     Dizziness per Dr. Kevin Fenton    HPI :   84 yo presents with dizziness. Felt palpitations but different than AF. No syncope. May have been worse with arising. BP and glucose were low at times. BP high o presentation. No chest pain or dyspnea. No worsening edema. Visiting from [place], [place].        PMHx:     Past Medical History:   Diagnosis Date   . Angioedema    . Diabetes mellitus    . Hypertension        Allergies:     Allergies   Allergen Reactions   . Ace Inhibitors    . Penicillins    . Prednisolone    . Sulfa Antibiotics        Home Medications:     No current facility-administered medications on file prior to encounter.      Current Outpatient Prescriptions on File Prior to Encounter   Medication Sig Dispense Refill   . benzonatate (TESSALON PERLES) 100 MG capsule Take 1 capsule (100 mg total) by mouth 3 (three) times daily as needed for Cough. 20 capsule 0   . Cholecalciferol (VITAMIN D) 1000 UNIT tablet Take 1,000 Units by mouth daily.     . cloNIDine (CATAPRES) 0.2 MG tablet Take 0.2 mg by mouth 2 (two) times daily.     . Diltiazem HCl ER Beads (TIAZAC PO) Take by mouth.     . esomeprazole (NEXIUM) 40 MG capsule Take 40 mg by mouth every morning before breakfast.     . ferrous sulfate 325 (65 FE) MG tablet Take 325 mg by mouth every morning with breakfast.     . insulin aspart (NOVOLOG 70/30) (70-30) 100 UNIT/ML injection Inject into the skin 2 (two) times daily before meals.     . insulin detemir (LEVEMIR) 100 UNIT/ML injection Inject into the skin.     . metFORMIN (GLUCOPHAGE) 850 MG tablet Take 850 mg by mouth 2 (two) times daily with meals.     . Olmesartan Medoxomil (BENICAR PO) Take by mouth.     . Rivaroxaban (XARELTO  PO) Take by mouth.     . vitamin B-12 (CYANOCOBALAMIN) 500 MCG tablet Take 500 mcg by mouth daily.           SoHx:     Social History   Substance Use Topics   . Smoking status: Never Smoker   . Smokeless tobacco: Never Used   . Alcohol use No       Surgical Hx:   History reviewed. No pertinent surgical history.    Family Hx:     Sister with CAD s/p PPM or ICD in her 76s    Review of Systems:   Constitutional: Negative for fevers and chills  Skin: No rash or lesions  Respiratory: Negative for hemoptysis  Cardiovascular: as per HPI  Gastrointestinal: Negative for hematochezia  Musculoskeletal:  No arthritic symptoms  Genitourinary: Negative for hematuria  Neurologic: No headaches  Otherwise 10 point review of systems is negative.      Physical Exam:   BP 145/78   Pulse  91   Temp (!) 96.2 F (35.7 C) (Oral)   Resp 20   Ht 1.575 m (5\' 2" )   Wt 83.9 kg (185 lb)   SpO2 98%   BMI 33.84 kg/m   98% SpO2 on              Constitutional:      Alert, cooperative, well developed, well nourished.  No acute distress.   Skin:     Warm and dry to touch.  No apparent rashes or bruising.   Head/Eyes :    Normocephalic.  EOM intact.  Normal conjunctivae and lids.       Neck:   Carotid pulses full and equal bilaterally.  No carotid bruit. JVP to 5 cm.   Lungs:     No tenderness to palpation.  Normal respiratory effort.  Clear to auscultation bilaterally.  No rhonchi, rales, or wheezes.     Cardiac:   LV apical impulse not displaced.  Regular rhythm.   S1 and   S2 normal.  No rub, or gallop   Abdomen:     Soft, non-tender.  Bowel sounds present.  No bruits.   Extremities:   No LE pitting edema.   Pulses:   Distal pulses are full and equal bilaterally.     Neurologic:   No gross motor or sensory deficits.  Alert O x 3.   Psychiatric:   Normal mood and affect.     Labs:     Admission on 06/04/2017   Component Date Value Ref Range Status   . Ventricular Rate 06/04/2017 63  BPM Preliminary   . Atrial Rate 06/04/2017 63  BPM  Preliminary   . P-R Interval 06/04/2017 286  ms Preliminary   . QRS Duration 06/04/2017 112  ms Preliminary   . Q-T Interval 06/04/2017 424  ms Preliminary   . QTC Calculation (Bezet) 06/04/2017 433  ms Preliminary   . P Axis 06/04/2017 39  degrees Preliminary   . R Axis 06/04/2017 -65  degrees Preliminary   . T Axis 06/04/2017 -5  degrees Preliminary   . WBC 06/04/2017 13.28* 3.10 - 9.50 x10 3/uL Final   . Hgb 06/04/2017 11.8  11.4 - 14.8 g/dL Final   . Hematocrit 17/61/6073 36.8  34.7 - 43.7 % Final   . Platelets 06/04/2017 364* 142 - 346 x10 3/uL Final   . RBC 06/04/2017 4.53  3.90 - 5.10 x10 6/uL Final   . MCV 06/04/2017 81.2  78.0 - 96.0 fL Final   . MCH 06/04/2017 26.0  25.1 - 33.5 pg Final   . MCHC 06/04/2017 32.1  31.5 - 35.8 g/dL Final   . RDW 71/06/2692 17* 11 - 15 % Final   . MPV 06/04/2017 10.5  8.9 - 12.5 fL Final   . Neutrophils 06/04/2017 38.4  None % Final   . Lymphocytes Automated 06/04/2017 48.1  None % Final   . Monocytes 06/04/2017 9.0  None % Final   . Eosinophils Automated 06/04/2017 3.5  None % Final   . Basophils Automated 06/04/2017 0.6  None % Final   . Immature Granulocyte 06/04/2017 0.4  None % Final   . Nucleated RBC 06/04/2017 0.0  0.0 - 0.0 /100 WBC Final   . Neutrophils Absolute 06/04/2017 5.09  1.10 - 6.33 x10 3/uL Final   . Abs Lymph Automated 06/04/2017 6.39* 0.42 - 3.22 x10 3/uL Final   . Abs Mono Automated 06/04/2017 1.20* 0.21 - 0.85 x10 3/uL Final   .  Abs Eos Automated 06/04/2017 0.47* 0.00 - 0.44 x10 3/uL Final   . Absolute Baso Automated 06/04/2017 0.08  0.00 - 0.08 x10 3/uL Final   . Absolute Immature Granulocyte 06/04/2017 0.05  0.00 - 0.07 x10 3/uL Final   . Absolute NRBC 06/04/2017 0.00  0.00 - 0.00 x10 3/uL Final   . PT 06/04/2017 16.4* 12.6 - 15.0 sec Final   . PT INR 06/04/2017 1.3* 0.9 - 1.1 Final   . PT Anticoag. Given Within 48 hrs. 06/04/2017 None   Final   . Glucose 06/04/2017 198* 70 - 100 mg/dL Final   . BUN 54/09/8117 27* 7 - 19 mg/dL Final   . Creatinine  14/78/2956 1.6* 0.6 - 1.0 mg/dL Final   . Calcium 21/30/8657 10.0  7.9 - 10.2 mg/dL Final   . Sodium 84/69/6295 135* 136 - 145 mEq/L Final   . Potassium 06/04/2017 5.0  3.5 - 5.1 mEq/L Final   . Chloride 06/04/2017 100  100 - 111 mEq/L Final   . CO2 06/04/2017 22  22 - 29 mEq/L Final   . Anion Gap 06/04/2017 13.0  5.0 - 15.0 Final   . B-Natriuretic Peptide 06/04/2017 195* 0 - 100 pg/mL Final   . Troponin I 06/04/2017 <0.01  0.00 - 0.09 ng/mL Final   . TSH 06/04/2017 1.70  0.35 - 4.94 uIU/mL Final   . Urine Type 06/04/2017 Clean Catch   Final   . Color, UA 06/04/2017 Straw  Clear - Yellow Final   . Clarity, UA 06/04/2017 Clear  Clear - Hazy Final   . Specific Gravity UA 06/04/2017 1.005  1.001 - 1.035 Final   . Urine pH 06/04/2017 7.0  5.0 - 8.0 Final   . Leukocyte Esterase, UA 06/04/2017 Negative  Negative Final   . Nitrite, UA 06/04/2017 Negative  Negative Final   . Protein, UR 06/04/2017 Negative  Negative Final   . Glucose, UA 06/04/2017 Negative  Negative Final   . Ketones UA 06/04/2017 Negative  Negative Final   . Urobilinogen, UA 06/04/2017 Negative  0.2 - 2.0 mg/dL Final   . Bilirubin, UA 06/04/2017 Negative  Negative Final   . Blood, UA 06/04/2017 Negative  Negative Final   . EGFR 06/04/2017 37.3   Final   . Magnesium 06/04/2017 1.4* 1.6 - 2.6 mg/dL Final   . Troponin I 28/41/3244 <0.01  0.00 - 0.09 ng/mL Final   . Glucose 06/05/2017 309* 70 - 100 mg/dL Final   . BUN 01/23/7251 24* 7 - 19 mg/dL Final   . Creatinine 66/44/0347 1.5* 0.6 - 1.0 mg/dL Final   . Calcium 42/59/5638 10.0  7.9 - 10.2 mg/dL Final   . Sodium 75/64/3329 133* 136 - 145 mEq/L Final   . Potassium 06/05/2017 4.8  3.5 - 5.1 mEq/L Final   . Chloride 06/05/2017 100  100 - 111 mEq/L Final   . CO2 06/05/2017 22  22 - 29 mEq/L Final   . Anion Gap 06/05/2017 11.0  5.0 - 15.0 Final   . Troponin I 06/05/2017 <0.01  0.00 - 0.09 ng/mL Final   . EGFR 06/05/2017 40.2   Final   . POCT - Glucose Whole blood 06/05/2017 264* 70 - 100 mg/dL Final        Radiology:     ECG independently reviewed by me shows sinus rhythm with RBBB at 63 bpm    Assessment/Plan:     TYREE VANDRUFF is a 84 y.o. female with h/o paroxysmal AF and DM presents with  dizziness and palpitations. No syncope. No evidence of ACS by ECG or enzymes. Telemetry reveals sinus rhythm and sinus tachycardia without bradyarrhythmias or AF. Resume diltiazem. Creatinine elevated but unclear if new. Hold ARB. Check orthostatics. Monitor HR with PT.    Thank you for the consultation. Please feel free to contact me with any questions that may arise regarding this visit.

## 2017-06-05 NOTE — Progress Notes (Signed)
Pt d/c home with her daughter. Reviewed d/c instructions and meds. Instructed to follow up with MD within 1 week. Removed IV and tele. Taken down to front in the w/c

## 2017-06-05 NOTE — UM Notes (Addendum)
84 YO W/ H/O DM, HTN PRESENTED TO ER W/ C/O DIZZINESS  BP (!) 187/103      Heart Rate 62      Resp Rate 18      Temp 98.3 F (36.8 C)      Temp Source Oral      SpO2 95 %     WBC 13.28 (*)      Platelets 364 (*)     RDW 17 (*)     Abs Lymph Automated 6.39 (*)     Abs Mono Automated 1.20 (*)     Abs Eos Automated 0.47 (*)      PT/INR - Abnormal; Notable for the following:     PT 16.4 (*)     PT INR 1.3 (*)      BASIC METABOLIC PANEL - Abnormal; Notable for the following:     Glucose 198 (*)     BUN 27 (*)     Creatinine 1.6 (*)     Sodium 135 (*)      -TYPE NATRIURETIC PEPTIDE - Abnormal; Notable for the following:     B-Natriuretic Peptide 195 (*)          ER Medication Administration from 06/04/2017 2059 to 06/05/2017 0255     Date/Time Order Dose Route Action Action by Comments   06/05/2017 0055 magnesium sulfate 1g in dextrose 5% IVPB (premix) 1 g Intravenous [address] [place], [place]    06/04/2017 2330 magnesium sulfate 1g in dextrose 5% IVPB (premix) 1 g Intravenous [address] [place], [place]    06/04/2017 2329 sodium chloride 0.9 % bolus 1,000 mL 1,000 mL Intravenous [address] [place], [place]    06/05/2017 0024 labetalol (NORMODYNE,TRANDATE) injection 10 mg 10 mg Intravenous Not Given         Place for Observation Services (Order [id number])   Admission   Date: 06/05/2017 Department: [facility] Ordering/Authorizing: Nyra Jabs, MD    W/ DX DIZZINESS, PALPITATION  TELE  IVF  CARD CONSULT-h/o paroxysmal AF and DM presents with dizziness and palpitations. No syncope. No evidence of ACS by ECG or enzymes. Telemetry reveals sinus rhythm and sinus tachycardia without bradyarrhythmias or AF. Resume diltiazem. Creatinine elevated but unclear if new. Hold ARB. Check orthostatics. Monitor HR with PT.   06/05/17 1135 06/05/17 1137 06/05/17 1138   Vital Signs   Heart Rate 87 91 92   Resp Rate --  --  18   BP 143/68 187/84 158/75   BP Location Left  forearm Left forearm Left forearm   BP Method Automatic Automatic Automatic   Patient Position Lying Sitting Sitting

## 2017-06-06 LAB — ECG 12-LEAD
Atrial Rate: 63 {beats}/min
P Axis: 39 degrees
P-R Interval: 286 ms
Q-T Interval: 424 ms
QRS Duration: 112 ms
QTC Calculation (Bezet): 433 ms
R Axis: -65 degrees
T Axis: -5 degrees
Ventricular Rate: 63 {beats}/min

## 2017-06-06 NOTE — H&P (Signed)
Patient Type: V     ATTENDING PHYSICIAN: Susann Givens, MD     HISTORY OF PRESENT ILLNESS:  An 84 year old African American female who was brought into the emergency  room by her family because of palpitations and dizziness and bradycardia as  well.  There was no chest pain or shortness of breath.     PAST MEDICAL HISTORY:  1.  Type 2 diabetes mellitus.  2.  Hypertension.  3.  Atrial fibrillation.     FAMILY HISTORY:  Not remarkable.     SOCIAL HISTORY:  She is divorced.  She lives in [place].  She is visiting here with  her daughter.     ALLERGIES:  Noted to ACE INHIBITORS, PENICILLIN, PREDNISONE, SULFA ANTIBIOTICS.     CURRENT MEDICATIONS:  Xarelto 20 mg daily, Protonix 40 mg daily.     PHYSICAL EXAMINATION:  VITAL SIGNS:  BP 187/103, heart rate is 62, respiratory rate is 18,  temperature 98.3, SpO2 95% on room air, weight 83.9 kilograms.  HEENT:  PERRLA, EOMI.  NECK:  Supple.  No thyromegaly or lymphadenopathy.  LUNGS:  Clear bilaterally.  HEART:  Sounds irregularly irregular.  ABDOMEN:  Benign.  EXTREMITIES:  No edema.  NEUROLOGIC:  She was grossly intact.     INVESTIGATIONS:  Done so far.  White cell count 13.28, hematocrit 36.8, BUN and creatinine  27 and 1.6.  Electrolytes were normal.  Troponin 0.01.  BNP is 195.  INR is  1.3.  Urinalysis is negative.  Chest x-ray showed stable appearance of the  chest.     HOSPITAL COURSE:  He was admitted to the medical unit.  She was seen in consultation with  cardiology.  Her troponins were trended and were negative.  Her medications  were improved.  Given that she had some bradycardia.  The thought that she  might have a tachybrady syndrome with her atrial fibrillation.  She was  taken out and walked around the nursing station and she did very well.  I  discussed her case with her daughter.  I instructed her to take her to her  primary care doctor and cardiologist at [facility] when she gets there.   She might need a pacemaker.     DISCHARGE DIAGNOSES:  1.   Dizziness and bradycardia in the setting of atrial fibrillation,  questionable tachybrady syndrome.  2.  Type 2 diabetes mellitus.     DISCHARGE MEDICATIONS:  Include:   Clonidine 0.2 mg b.i.d., diltiazem 60 mg q.6 h, Omeprazole 40 mg  daily, ferrous sulfate 325 mg daily, insulin 70/30 twice a day, metformin  850 mg b.i.d., vitamin B12 500 mcg daily, vitamin D 1000 units daily, and  Xarelto as previously scheduled.           D:  06/06/2017 13:44 PM by Dr. Barnie Del A. Kevin Fenton, MD [phone])  T:  06/06/2017 16:22 PM by NTS      Everlean Cherry: [id number]) (Doc ID: [id number])

## 2017-06-09 DIAGNOSIS — I4891 Unspecified atrial fibrillation: Secondary | ICD-10-CM | POA: Diagnosis not present

## 2017-06-09 DIAGNOSIS — E785 Hyperlipidemia, unspecified: Secondary | ICD-10-CM | POA: Diagnosis not present

## 2017-06-09 DIAGNOSIS — I10 Essential (primary) hypertension: Secondary | ICD-10-CM | POA: Diagnosis not present

## 2017-06-09 DIAGNOSIS — E119 Type 2 diabetes mellitus without complications: Secondary | ICD-10-CM | POA: Diagnosis not present

## 2017-07-09 DIAGNOSIS — E785 Hyperlipidemia, unspecified: Secondary | ICD-10-CM | POA: Diagnosis not present

## 2017-07-09 DIAGNOSIS — E119 Type 2 diabetes mellitus without complications: Secondary | ICD-10-CM | POA: Diagnosis not present

## 2017-07-09 DIAGNOSIS — I10 Essential (primary) hypertension: Secondary | ICD-10-CM | POA: Diagnosis not present

## 2017-07-09 DIAGNOSIS — I480 Paroxysmal atrial fibrillation: Secondary | ICD-10-CM | POA: Diagnosis not present

## 2017-07-25 ENCOUNTER — Emergency Department (HOSPITAL_COMMUNITY): Payer: Medicare HMO

## 2017-07-25 ENCOUNTER — Other Ambulatory Visit: Payer: Self-pay

## 2017-07-25 ENCOUNTER — Inpatient Hospital Stay (HOSPITAL_COMMUNITY)
Admission: EM | Admit: 2017-07-25 | Discharge: 2017-07-27 | DRG: 684 | Disposition: A | Discharge status: Home / Self Care | Payer: Medicare HMO | Attending: Internal Medicine | Admitting: Internal Medicine

## 2017-07-25 ENCOUNTER — Encounter (HOSPITAL_COMMUNITY): Payer: Self-pay

## 2017-07-25 DIAGNOSIS — E785 Hyperlipidemia, unspecified: Secondary | ICD-10-CM | POA: Diagnosis present

## 2017-07-25 DIAGNOSIS — R05 Cough: Secondary | ICD-10-CM | POA: Diagnosis not present

## 2017-07-25 DIAGNOSIS — N179 Acute kidney failure, unspecified: Secondary | ICD-10-CM | POA: Diagnosis not present

## 2017-07-25 DIAGNOSIS — Z882 Allergy status to sulfonamides status: Secondary | ICD-10-CM

## 2017-07-25 DIAGNOSIS — Z888 Allergy status to other drugs, medicaments and biological substances status: Secondary | ICD-10-CM | POA: Diagnosis not present

## 2017-07-25 DIAGNOSIS — Z8673 Personal history of transient ischemic attack (TIA), and cerebral infarction without residual deficits: Secondary | ICD-10-CM | POA: Diagnosis not present

## 2017-07-25 DIAGNOSIS — E1165 Type 2 diabetes mellitus with hyperglycemia: Secondary | ICD-10-CM | POA: Diagnosis not present

## 2017-07-25 DIAGNOSIS — Z8249 Family history of ischemic heart disease and other diseases of the circulatory system: Secondary | ICD-10-CM

## 2017-07-25 DIAGNOSIS — IMO0002 Reserved for concepts with insufficient information to code with codable children: Secondary | ICD-10-CM | POA: Diagnosis present

## 2017-07-25 DIAGNOSIS — I480 Paroxysmal atrial fibrillation: Secondary | ICD-10-CM | POA: Diagnosis present

## 2017-07-25 DIAGNOSIS — I129 Hypertensive chronic kidney disease with stage 1 through stage 4 chronic kidney disease, or unspecified chronic kidney disease: Secondary | ICD-10-CM | POA: Diagnosis not present

## 2017-07-25 DIAGNOSIS — L259 Unspecified contact dermatitis, unspecified cause: Secondary | ICD-10-CM | POA: Diagnosis not present

## 2017-07-25 DIAGNOSIS — Z9081 Acquired absence of spleen: Secondary | ICD-10-CM | POA: Diagnosis not present

## 2017-07-25 DIAGNOSIS — Z841 Family history of disorders of kidney and ureter: Secondary | ICD-10-CM

## 2017-07-25 DIAGNOSIS — N182 Chronic kidney disease, stage 2 (mild): Secondary | ICD-10-CM | POA: Diagnosis present

## 2017-07-25 DIAGNOSIS — I10 Essential (primary) hypertension: Secondary | ICD-10-CM | POA: Diagnosis not present

## 2017-07-25 DIAGNOSIS — E875 Hyperkalemia: Secondary | ICD-10-CM | POA: Diagnosis present

## 2017-07-25 DIAGNOSIS — Z6832 Body mass index (BMI) 32.0-32.9, adult: Secondary | ICD-10-CM | POA: Diagnosis not present

## 2017-07-25 DIAGNOSIS — K219 Gastro-esophageal reflux disease without esophagitis: Secondary | ICD-10-CM | POA: Diagnosis not present

## 2017-07-25 DIAGNOSIS — Z833 Family history of diabetes mellitus: Secondary | ICD-10-CM

## 2017-07-25 DIAGNOSIS — E119 Type 2 diabetes mellitus without complications: Secondary | ICD-10-CM | POA: Diagnosis not present

## 2017-07-25 DIAGNOSIS — R001 Bradycardia, unspecified: Secondary | ICD-10-CM | POA: Diagnosis present

## 2017-07-25 DIAGNOSIS — D509 Iron deficiency anemia, unspecified: Secondary | ICD-10-CM | POA: Diagnosis present

## 2017-07-25 DIAGNOSIS — I959 Hypotension, unspecified: Secondary | ICD-10-CM | POA: Diagnosis not present

## 2017-07-25 DIAGNOSIS — Z794 Long term (current) use of insulin: Secondary | ICD-10-CM

## 2017-07-25 DIAGNOSIS — Z7901 Long term (current) use of anticoagulants: Secondary | ICD-10-CM | POA: Diagnosis not present

## 2017-07-25 DIAGNOSIS — E876 Hypokalemia: Secondary | ICD-10-CM | POA: Diagnosis not present

## 2017-07-25 DIAGNOSIS — I639 Cerebral infarction, unspecified: Secondary | ICD-10-CM | POA: Diagnosis not present

## 2017-07-25 DIAGNOSIS — R031 Nonspecific low blood-pressure reading: Secondary | ICD-10-CM | POA: Diagnosis not present

## 2017-07-25 LAB — I-STAT TROPONIN, ED: TROPONIN I, POC: 0 ng/mL (ref 0.00–0.08)

## 2017-07-25 LAB — BASIC METABOLIC PANEL
ANION GAP: 8 (ref 5–15)
BUN: 33 mg/dL — AB (ref 8–23)
CALCIUM: 9.3 mg/dL (ref 8.9–10.3)
CO2: 20 mmol/L — ABNORMAL LOW (ref 22–32)
Chloride: 105 mmol/L (ref 98–111)
Creatinine, Ser: 1.88 mg/dL — ABNORMAL HIGH (ref 0.44–1.00)
GFR calc Af Amer: 28 mL/min — ABNORMAL LOW (ref >60)
GFR, EST NON AFRICAN AMERICAN: 24 mL/min — AB (ref >60)
Glucose, Bld: 239 mg/dL — ABNORMAL HIGH (ref 70–99)
Potassium: 5.9 mmol/L — ABNORMAL HIGH (ref 3.5–5.1)
Sodium: 133 mmol/L — ABNORMAL LOW (ref 135–145)

## 2017-07-25 LAB — CBC
HCT: 33.8 % — ABNORMAL LOW (ref 36.0–46.0)
Hemoglobin: 10.6 g/dL — ABNORMAL LOW (ref 12.0–15.0)
MCH: 27 pg (ref 26.0–34.0)
MCHC: 31.4 g/dL (ref 30.0–36.0)
MCV: 86 fL (ref 78.0–100.0)
PLATELETS: 405 10*3/uL — AB (ref 150–400)
RBC: 3.93 MIL/uL (ref 3.87–5.11)
RDW: 17.4 % — AB (ref 11.5–15.5)
WBC: 9.7 10*3/uL (ref 4.0–10.5)

## 2017-07-25 LAB — TSH: TSH: 1.646 u[IU]/mL (ref 0.350–4.500)

## 2017-07-25 LAB — GLUCOSE, CAPILLARY: GLUCOSE-CAPILLARY: 192 mg/dL — AB (ref 70–99)

## 2017-07-25 LAB — SEDIMENTATION RATE: SED RATE: 31 mm/h — AB (ref 0–22)

## 2017-07-25 MED ORDER — ACETAMINOPHEN 650 MG RE SUPP: 650.0000 mg | Freq: Four times a day (QID) | RECTAL | Status: DC | PRN

## 2017-07-25 MED ORDER — INSULIN ASPART 100 UNIT/ML ~~LOC~~ SOLN: 10.0000 [IU] | Freq: Once | SUBCUTANEOUS | Status: DC

## 2017-07-25 MED ORDER — OMEPRAZOLE MAGNESIUM 20 MG PO TBEC: 20.0000 mg | DELAYED_RELEASE_TABLET | Freq: Every day | ORAL | Status: DC

## 2017-07-25 MED ORDER — INSULIN REGULAR BOLUS VIA INFUSION: 10.0000 [IU] | Freq: Once | INTRAVENOUS | Status: DC

## 2017-07-25 MED ORDER — INSULIN ASPART 100 UNIT/ML ~~LOC~~ SOLN: 0.0000 [IU] | Freq: Every day | SUBCUTANEOUS | Status: DC

## 2017-07-25 MED ORDER — INSULIN ASPART 100 UNIT/ML ~~LOC~~ SOLN
10.0000 [IU] | Freq: Once | SUBCUTANEOUS | Status: AC
  Administered 2017-07-25: 10 [IU] via INTRAVENOUS
  Filled 2017-07-25: qty 1

## 2017-07-25 MED ORDER — SODIUM CHLORIDE 0.9 % IV BOLUS
1000.0000 mL | Freq: Once | INTRAVENOUS | Status: AC
  Administered 2017-07-25: 1000 mL via INTRAVENOUS

## 2017-07-25 MED ORDER — FERROUS SULFATE 325 (65 FE) MG PO TABS
ORAL_TABLET | Freq: Every day | ORAL | Status: DC
  Filled 2017-07-25: qty 1

## 2017-07-25 MED ORDER — SODIUM CHLORIDE 0.9 % IV SOLN
INTRAVENOUS | Status: DC
  Administered 2017-07-25 – 2017-07-26 (×3): via INTRAVENOUS

## 2017-07-25 MED ORDER — ADULT MULTIVITAMIN W/MINERALS CH
1.0000 | ORAL_TABLET | Freq: Every day | ORAL | Status: DC
  Administered 2017-07-25 – 2017-07-27 (×3): 1 via ORAL
  Filled 2017-07-25 (×3): qty 1

## 2017-07-25 MED ORDER — SODIUM POLYSTYRENE SULFONATE 15 GM/60ML PO SUSP
30.0000 g | Freq: Once | ORAL | Status: AC
  Administered 2017-07-25: 30 g via ORAL
  Filled 2017-07-25: qty 120

## 2017-07-25 MED ORDER — ACETAMINOPHEN 325 MG PO TABS: 650.0000 mg | ORAL_TABLET | Freq: Four times a day (QID) | ORAL | Status: DC | PRN

## 2017-07-25 MED ORDER — PANTOPRAZOLE SODIUM 40 MG PO TBEC
40.0000 mg | DELAYED_RELEASE_TABLET | Freq: Every day | ORAL | Status: DC
  Administered 2017-07-25 – 2017-07-27 (×3): 40 mg via ORAL
  Filled 2017-07-25 (×3): qty 1

## 2017-07-25 MED ORDER — SODIUM BICARBONATE 8.4 % IV SOLN
50.0000 meq | Freq: Once | INTRAVENOUS | Status: AC
  Administered 2017-07-25: 50 meq via INTRAVENOUS
  Filled 2017-07-25: qty 50

## 2017-07-25 MED ORDER — INSULIN ASPART 100 UNIT/ML ~~LOC~~ SOLN
0.0000 [IU] | Freq: Three times a day (TID) | SUBCUTANEOUS | Status: DC
  Administered 2017-07-26 (×2): 3 [IU] via SUBCUTANEOUS
  Administered 2017-07-26: 2 [IU] via SUBCUTANEOUS

## 2017-07-25 MED ORDER — GLUCERNA SHAKE PO LIQD
237.0000 mL | ORAL | Status: DC
  Administered 2017-07-26: 237 mL via ORAL

## 2017-07-25 MED ORDER — SODIUM CHLORIDE 0.9 % IV SOLN
1.0000 g | Freq: Once | INTRAVENOUS | Status: AC
  Administered 2017-07-25: 1 g via INTRAVENOUS
  Filled 2017-07-25: qty 10

## 2017-07-25 MED ORDER — VITAMIN C 500 MG PO TABS
500.0000 mg | ORAL_TABLET | Freq: Every day | ORAL | Status: DC
  Administered 2017-07-25 – 2017-07-27 (×3): 500 mg via ORAL
  Filled 2017-07-25 (×3): qty 1

## 2017-07-25 MED ORDER — DEXTROSE 50 % IV SOLN
1.0000 | Freq: Once | INTRAVENOUS | Status: AC
  Administered 2017-07-25: 50 mL via INTRAVENOUS
  Filled 2017-07-25: qty 50

## 2017-07-25 MED ORDER — EMERGEN-C VITAMIN C PO PACK: PACK | Freq: Every day | ORAL | Status: DC

## 2017-07-25 MED ORDER — CLONIDINE HCL 0.2 MG PO TABS
0.2000 mg | ORAL_TABLET | Freq: Three times a day (TID) | ORAL | Status: DC
  Administered 2017-07-25 – 2017-07-27 (×5): 0.2 mg via ORAL
  Filled 2017-07-25 (×5): qty 1

## 2017-07-25 MED ORDER — RISAQUAD PO CAPS
1.0000 | ORAL_CAPSULE | Freq: Every day | ORAL | Status: DC
  Administered 2017-07-26 – 2017-07-27 (×2): 1 via ORAL
  Filled 2017-07-25 (×2): qty 1

## 2017-07-25 MED ORDER — INSULIN DETEMIR 100 UNIT/ML ~~LOC~~ SOLN
20.0000 [IU] | Freq: Two times a day (BID) | SUBCUTANEOUS | Status: DC
Start: 2017-07-26 — End: 2017-07-27
  Administered 2017-07-26 (×2): 20 [IU] via SUBCUTANEOUS
  Filled 2017-07-25 (×4): qty 0.2

## 2017-07-25 MED ORDER — PROBIOTIC FORMULA 1-250 BILLION-MG PO CAPS: ORAL_CAPSULE | Freq: Every day | ORAL | Status: DC

## 2017-07-25 MED ORDER — RIVAROXABAN 15 MG PO TABS
15.0000 mg | ORAL_TABLET | Freq: Every day | ORAL | Status: DC
  Administered 2017-07-25 – 2017-07-26 (×2): 15 mg via ORAL
  Filled 2017-07-25 (×2): qty 1

## 2017-07-25 NOTE — ED Provider Notes (Signed)
[facility] EMERGENCY DEPARTMENT Provider Note   CSN: [id number] Arrival date & time: 07/25/17  1558     History   Chief Complaint Chief Complaint  Patient presents with  . Hypotension    HPI [name] is a 84 y.o. female.  HPI: Pt has a history of HTN, DM, and CVA without residual deficits on xarelto who presents to the ED because she felt unwell and like her heart was beating slowly. She reports a mild cough over the past few days, but no rhinorrhea or nasal congestion. She denies fevers, chills, chest pain, dyspnea, abdominal pain, weakness, or dysuria. She states she has had normal PO intake recently. She reports one episode of dark, tarry stool that occurred earlier today. Of note, patient is on clonidine, diltiazem, and losartan. The patient's daughter notes that she sleeps a lot during the day, but hasn't seemed more fatigued than normal. Patient lives independently with children nearby for help with activities of daily living.  Past Medical History:  Diagnosis Date  . ANEMIA, IRON DEFICIENCY 08/11/2008  . ANGIOEDEMA 03/01/2008  . CEREBROVASCULAR ACCIDENT, HX OF 03/01/2008  . CONTACT DERMATITIS&OTHER ECZEMA DUE UNSPEC CAUSE 11/28/2008  . DIABETES MELLITUS 03/01/2008  . Dysuria 01/04/2010  . GERD 03/01/2008  . HYPERSOMNIA 05/30/2009  . HYPERTENSION 03/01/2008  . INTERNAL HEMORRHOIDS 03/01/2008  . KNEE PAIN, RIGHT 09/05/2009  . NONSPECIFIC ABN FINDNG RAD&OTH EXAM BILARY TRCT 03/07/2008  . OBESITY 01/10/2010  . PERIPHERAL EDEMA 08/10/2008  . POLYP, GALLBLADDER 03/07/2008  . SMALL BOWEL OBSTRUCTION, HX OF 03/01/2008  . SNORING 05/11/2009  . Stroke ([place]) 04/08/2016   TIA  . THROMBOCYTOSIS 08/18/2008    Patient Active Problem List   Diagnosis Date Noted  . TIA (transient ischemic attack) 04/08/2016  . Hypocalcemia 04/08/2016  . OBESITY 01/10/2010  . DYSURIA 01/04/2010  . KNEE PAIN, RIGHT 09/05/2009  . HYPERSOMNIA 05/30/2009  . URI 05/11/2009  . SNORING 05/11/2009    . CONTACT DERMATITIS&OTHER ECZEMA DUE UNSPEC CAUSE 11/28/2008  . THROMBOCYTOSIS 08/18/2008  . ANEMIA, IRON DEFICIENCY 08/11/2008  . PERIPHERAL EDEMA 08/10/2008  . POLYP, GALLBLADDER 03/07/2008  . NONSPECIFIC ABN FINDNG RAD&OTH EXAM BILARY TRCT 03/07/2008  . Diabetes mellitus type II, uncontrolled ([place]) 03/01/2008  . Essential hypertension 03/01/2008  . INTERNAL HEMORRHOIDS 03/01/2008  . GERD 03/01/2008  . ANGIOEDEMA 03/01/2008  . CEREBROVASCULAR ACCIDENT, HX OF 03/01/2008  . SMALL BOWEL OBSTRUCTION, HX OF 03/01/2008    Past Surgical History:  Procedure Laterality Date  . HERNIA REPAIR  09/14/08   with release [facility]-Dr. Dalbert Batman  . SMALL INTESTINE SURGERY    . 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 Authorizing Provider  clindamycin (CLEOCIN) 150 MG capsule Take 1 capsule (150 mg total) by mouth 3 (three) times daily. 02/26/17   Vanessa Kick, MD  cloNIDine (CATAPRES) 0.2 MG tablet Take 0.2 mg by mouth 3 (three) times daily.     [provider]  Cranberry (SM CRANBERRY) 300 MG tablet Take 300 mg by mouth 2 (two) times daily as needed (for urinary issues).     [provider]  diltiazem (CARTIA XT) 300 MG 24 hr capsule Take 300 mg by mouth daily.    [provider]  feeding supplement, [place], ([place]) LIQD Take  237 mLs by mouth 2 (two) times daily.    [provider]  insulin aspart (NOVOLOG) 100 UNIT/ML injection Inject 0-10 Units into the skin See admin instructions. 2 times a day per sliding scale before meals (breakfast and evening meal/dinner) and take nothing if BGL is 136 or less    [provider]  insulin detemir (LEVEMIR) 100 UNIT/ML injection Inject 20 Units into the skin at bedtime.    [provider]  ketoconazole (NIZORAL) 2 % cream  Apply 1 application topically 2 (two) times daily. 10/10/16   Robyn Haber, MD  losartan (COZAAR) 100 MG tablet Take 100 mg by mouth daily.    [provider]  metFORMIN (GLUCOPHAGE) 850 MG tablet Take 850 mg by mouth 2 (two) times daily.     [provider]  Multiple Vitamin (MULTIVITAMIN) tablet Take 1 tablet by mouth every morning.     [provider]  olmesartan (BENICAR) 40 MG tablet Take 40 mg by mouth every morning.    [provider]  omeprazole (PRILOSEC OTC) 20 MG tablet Take 20 mg by mouth daily.    [provider]  polycarbophil (FIBERCON) 625 MG tablet Take 625 mg by mouth daily as needed for mild constipation.    [provider]  Probiotic Product (PROBIOTIC FORMULA PO) Take 1 tablet by mouth daily.     [provider]  Rivaroxaban (XARELTO) 20 MG TABS tablet Take 20 mg by mouth daily with supper.    [provider]    Family History Family History  Problem Relation Age of Onset  . Diabetes Mother   . Heart disease Mother   . Diabetes Sister   . Heart disease Sister   . Colitis Sister   . Kidney disease Brother   . Breast cancer Other        Neice  . Allergies Other        Nephrew  . Asthma Other        Nephrew    Social History Social History   Tobacco Use  . Smoking status: Never Smoker  . Smokeless tobacco: Never Used  Substance Use Topics  . Alcohol use: No  . Drug use: No     Allergies   Ace inhibitors; Prednisone; Penicillins; Sulfamethoxazole; and Sulfonamide derivatives   Review of Systems Review of Systems  Constitutional: Negative for chills and fever.  HENT: Negative for congestion and rhinorrhea.   Respiratory: Positive for cough. Negative for shortness of breath.   Cardiovascular: Negative for chest pain and leg swelling.     Physical Exam Updated Vital Signs BP (!) 132/56   Pulse (!) 49   Temp 98.3 F (36.8 C) (Oral)   Resp 17   Ht 5\' 2"  (1.575 m)   Wt  81.6 kg (180 lb)   SpO2 100%   BMI 32.92 kg/m   Physical Exam  Constitutional: She is oriented to person, place, and time. She appears well-developed and well-nourished. No distress.  HENT:  Head: Normocephalic and atraumatic.  Mouth/Throat: Oropharynx is clear and moist.  Eyes: Pupils are equal, round, and reactive to light. EOM are normal.  Neck: Normal range of motion. Neck supple.  Cardiovascular: Normal rate and regular rhythm. Exam reveals no gallop and no friction rub.  No murmur heard. Pulmonary/Chest: Effort normal and breath sounds normal. No respiratory distress. She has no wheezes. She has no rales.  Abdominal: Soft. Bowel sounds are normal. She exhibits no distension. There is no tenderness. There  is no guarding.  Musculoskeletal:  Trace bilateral lower extremity edema  Neurological: She is alert and oriented to person, place, and time. No cranial nerve deficit. She exhibits normal muscle tone.  Skin: Skin is warm and dry. Capillary refill takes 2 to 3 seconds. No rash noted.  Psychiatric: She has a normal mood and affect. Her behavior is normal.     ED Treatments / Results  Labs (all labs ordered are listed, but only abnormal results are displayed) Labs Reviewed  BASIC METABOLIC PANEL - Abnormal; Notable for the following components:      Result Value   Sodium 133 (*)    Potassium 5.9 (*)    CO2 20 (*)    Glucose, Bld 239 (*)    BUN 33 (*)    Creatinine, Ser 1.88 (*)    GFR calc non Af Amer 24 (*)    GFR calc Af Amer 28 (*)    All other components within normal limits  CBC - Abnormal; Notable for the following components:   Hemoglobin 10.6 (*)    HCT 33.8 (*)    RDW 17.4 (*)    Platelets 405 (*)    All other components within normal limits  URINALYSIS, ROUTINE W REFLEX MICROSCOPIC  I-STAT TROPONIN, ED    EKG EKG Interpretation  Date/Time:  Friday July 25 2017 18:11:48 EDT Ventricular Rate:  50 PR Interval:    QRS Duration: 137 QT  Interval:  483 QTC Calculation: 441 R Axis:   -3 Text Interpretation:  Junctional rhythm IVCD, consider atypical RBBB Probable anterior infarct, age indeterminate Lateral leads are also involved Confirmed by Lacretia Leigh ([id number]) on 07/25/2017 6:32:14 PM   Radiology Dg Chest 2 View  Result Date: 07/25/2017 CLINICAL DATA:  Low blood pressure. EXAM: CHEST - 2 VIEW COMPARISON:  April 17, 2013 FINDINGS: The heart size and mediastinal contours are within normal limits. Both lungs are clear. The visualized skeletal structures are unremarkable. IMPRESSION: No active cardiopulmonary disease. Electronically Signed   By: Dorise Bullion III M.D   On: 07/25/2017 16:53    Procedures Procedures (including critical care time)  Medications Ordered in ED Medications  sodium chloride 0.9 % bolus 1,000 mL (1,000 mLs Intravenous New Bag/Given 07/25/17 1852)  0.9 %  sodium chloride infusion (has no administration in time range)  sodium polystyrene (KAYEXALATE) 15 GM/60ML suspension 30 g (30 g Oral Given 07/25/17 1909)  dextrose 50 % solution 50 mL (50 mLs Intravenous Given 07/25/17 1905)  insulin aspart (novoLOG) injection 10 Units (10 Units Intravenous Given 07/25/17 1902)     Initial Impression / Assessment and Plan / ED Course  I have reviewed the triage vital signs and the nursing notes.  Pertinent labs & imaging results that were available during my care of the patient were reviewed by me and considered in my medical decision making (see chart for details).  Ms. Muratore is an 84 yo female with a medical history of HTN, DM, and CVA without residual deficits on Xarelto who presents to the ED with fatigue, bradycardia, and hypotension. Upon arrival to the ED, she was afebrile with a blood pressure of 93/49 and a heart rate of 52. DDx for hypotension in this patient includes dehydration, anti-hypertensive polypharmacy, or sepsis 2/2 infection. Labs were significant for hyperkalemia to 5.9, CO2 20, glucose 239, BUN  33, Cr 1.88 (1.1 03/2017), and Hb 10.6. CXR was unremarkable. No infectious source of possible infection was identified, but UA is pending. With hyperkalemia and EKG  changes, patient was treated with kayexalate, 1 amp D50, and 10 units of insulin. Hydrated the patient with 2L NS. Patient warrants inpatient admission due to AKI, electrolyte abnormalities, and EKG changes. Dr. Maudie Mercury accepted the patient to internal medicine. The patient's PCP Dr. Terrence Dupont was contacted. He agrees with the admission and will assume care in the morning.    Final Clinical Impressions(s) / ED Diagnoses   Final diagnoses:  None    ED Discharge Orders    None       Dorrell, Andree Elk, MD 07/25/17 8325    Lacretia Leigh, MD 07/26/17 1524

## 2017-07-25 NOTE — ED Notes (Signed)
Family at bedside. 

## 2017-07-25 NOTE — ED Triage Notes (Signed)
Pt arrived with c/o low BP today 152/79 HR 54 @ 625, BP 118/54, HR 55 @215 , 112/53 HR 53 @ 320 pm today. Pt reports that she was feeling different "strange and weak"

## 2017-07-25 NOTE — Progress Notes (Signed)
[place] for Xarelto Indication: atrial fibrillation  Labs: Recent Labs    07/25/17 1713  HGB 10.6*  HCT 33.8*  PLT 405*  CREATININE 1.88*    Assessment: 53 yof on Xarelto PTA for hx afib, stroke. Pharmacy consulted to dose inpatient with AKI on admit. SCr 1.88 on admit, CrCl~20-25. Hg 10.6, plt 405 on admit. No bleed documented. Last Xarelto dose PTA was "maybe" 7/4 per med rec.  Goal of Therapy:  Stroke prevention Monitor platelets by anticoagulation protocol: Yes   Plan:  Will reduce Xarelto dose to 15mg  PO Qsupper for now with CrCl<50 Monitor SCr trend and increase back to home dose as appropriate Watch CBC, s/sx bleeding  Elicia Lamp, PharmD, BCPS Clinical Pharmacist 07/25/2017 9:01 PM

## 2017-07-25 NOTE — ED Provider Notes (Signed)
I saw and evaluated the patient, reviewed the resident's note and I agree with the findings and plan.  EKG: EKG Interpretation  Date/Time:  Friday July 25 2017 18:11:48 EDT Ventricular Rate:  50 PR Interval:    QRS Duration: 137 QT Interval:  483 QTC Calculation: 441 R Axis:   -3 Text Interpretation:  Junctional rhythm IVCD, consider atypical RBBB Probable anterior infarct, age indeterminate Lateral leads are also involved Confirmed by Lacretia Leigh [phone]) on 07/25/2017 6:58:68 PM  84 year old female presents here with subjective complaints of bradycardia.  Denies any syncope.  No chest pain.  Evidence of acute kidney injury as well as hyperkalemia.  Also has questional EKG changes.  She does have some bradycardia.  Will treat with Kayexalate, insulin and glucose.  We will also give IV fluids.  Will admit to the medicine service   Lacretia Leigh, MD 07/25/17 [phone]

## 2017-07-25 NOTE — ED Notes (Signed)
Pt reports feeling weak today, reports hypotension.  She is A&Ox 4.  No complaints of pain.  In NAD.

## 2017-07-25 NOTE — ED Notes (Signed)
Patient transported to X-ray 

## 2017-07-25 NOTE — H&P (Addendum)
TRH H&P   Patient Demographics:    [name], is a 84 y.o. female  MRN: [id number]   DOB - [birth year]  Admit Date - 07/25/2017  Outpatient Primary MD for the patient is Patient, No Pcp Per Charolette Forward  Referring MD/NP/PA:   Vivi Martens  Outpatient Specialists:   Patient coming from: none  Chief Complaint  Patient presents with  . Hypotension      HPI:    [name]  is a 84 y.o. female, w hypertension, hyperlipidemia, dm2, h/o stroke who presents with feeling weak.  Pt notes that she was just traveling from [place] over the past 2 weeks. Might be dehydrated from this.  Pt denies any recent medication adjustments.  Does confirm that she is taking losartan, spironolactone and metformin.  Pt denies fever, chills, cough, cp, palp, sob,  n/v, abd pain dysuria, hematuria, diarrhea, brbpr.  Her stool is black but she is taking iron.    In Ed, heart rate as low as 47, and bp as low as 89/48  CXR IMPRESSION: No active cardiopulmonary disease.  Na 133, K 5.9, Bun 33, Creatinine 1.88 Glucose 239 Hco3 20  Wbc 9.7, Hgb 10.6, Plt 405 Trop 0.00  Pt will be admitted for ARF, hyperkalemia, anemia, and acidosis like related to lactic acidosis due to use of metformin in setting of ARF.     Review of systems:    In addition to the HPI above, No Fever-chills, No Headache, No changes with Vision or hearing, No problems swallowing food or Liquids, No Chest pain, Cough or Shortness of Breath, No Abdominal pain, No Nausea or Vommitting, Bowel movements are regular, No Blood in stool or Urine, No dysuria, No new skin rashes or bruises, No new joints pains-aches,  No new weakness, tingling, numbness in any extremity, No recent weight gain or loss, No polyuria, polydypsia or polyphagia, No significant Mental Stressors.  A full 10 point Review of Systems was done, except as  stated above, all other Review of Systems were negative.   With Past History of the following :    Past Medical History:  Diagnosis Date  . ANEMIA, IRON DEFICIENCY 08/11/2008  . ANGIOEDEMA 03/01/2008  . CEREBROVASCULAR ACCIDENT, HX OF 03/01/2008  . CONTACT DERMATITIS&OTHER ECZEMA DUE UNSPEC CAUSE 11/28/2008  . DIABETES MELLITUS 03/01/2008  . Dysuria 01/04/2010  . GERD 03/01/2008  . HYPERSOMNIA 05/30/2009  . HYPERTENSION 03/01/2008  . INTERNAL HEMORRHOIDS 03/01/2008  . KNEE PAIN, RIGHT 09/05/2009  . NONSPECIFIC ABN FINDNG RAD&OTH EXAM BILARY TRCT 03/07/2008  . OBESITY 01/10/2010  . PERIPHERAL EDEMA 08/10/2008  . POLYP, GALLBLADDER 03/07/2008  . SMALL BOWEL OBSTRUCTION, HX OF 03/01/2008  . SNORING 05/11/2009  . Stroke ([place]) 04/08/2016   TIA  . THROMBOCYTOSIS 08/18/2008      Past Surgical History:  Procedure Laterality Date  . HERNIA REPAIR  09/14/08   with release [facility]-Dr.  Bradley    . 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 Smoking status: Never Smoker  . Smokeless tobacco: Never Used  Substance Use Topics  . Alcohol use: No     Lives - at home  Mobility -   Walks by self   Family History :     Family History  Problem Relation Age of Onset  . Diabetes Mother   . Heart disease Mother   . Diabetes Sister   . Heart disease Sister   . Colitis Sister   . Kidney disease Brother   . Breast cancer Other        Neice  . Allergies Other        Nephrew  . Asthma Other        Nephrew       Home Medications:   Prior to Admission medications   Medication Sig Start Date End Date Taking? Authorizing Provider  clindamycin (CLEOCIN) 150 MG capsule Take 1 capsule (150 mg total) by mouth 3 (three) times daily. 02/26/17   Vanessa Kick, MD  cloNIDine (CATAPRES) 0.2 MG tablet Take 0.2 mg by mouth 3 (three) times daily.     [provider]  Cranberry  (SM CRANBERRY) 300 MG tablet Take 300 mg by mouth 2 (two) times daily as needed (for urinary issues).     [provider]  diltiazem (CARTIA XT) 300 MG 24 hr capsule Take 300 mg by mouth daily.    [provider]  feeding supplement, GLUCERNA SHAKE, (GLUCERNA SHAKE) LIQD Take 237 mLs by mouth 2 (two) times daily.    [provider]  insulin aspart (NOVOLOG) 100 UNIT/ML injection Inject 0-10 Units into the skin See admin instructions. 2 times a day per sliding scale before meals (breakfast and evening meal/dinner) and take nothing if BGL is 136 or less    [provider]  insulin detemir (LEVEMIR) 100 UNIT/ML injection Inject 20 Units into the skin at bedtime.    [provider]  ketoconazole (NIZORAL) 2 % cream Apply 1 application topically 2 (two) times daily. 10/10/16   Robyn Haber, MD  losartan (COZAAR) 100 MG tablet Take 100 mg by mouth daily.    [provider]  metFORMIN (GLUCOPHAGE) 850 MG tablet Take 850 mg by mouth 2 (two) times daily.     [provider]  Multiple Vitamin (MULTIVITAMIN) tablet Take 1 tablet by mouth every morning.     [provider]  olmesartan (BENICAR) 40 MG tablet Take 40 mg by mouth every morning.    [provider]  omeprazole (PRILOSEC OTC) 20 MG tablet Take 20 mg by mouth daily.    [provider]  polycarbophil (FIBERCON) 625 MG tablet Take 625 mg by mouth daily as needed for mild constipation.    [provider]  Probiotic Product (PROBIOTIC FORMULA PO) Take 1 tablet by mouth daily.     [provider]  Rivaroxaban (XARELTO) 20 MG TABS tablet Take 20 mg by mouth daily with supper.    [provider]     Allergies:     Allergies  Allergen Reactions  . Ace Inhibitors Swelling    Angioedema   . Prednisone Nausea And Vomiting, Other (See Comments) and Hypertension    Blood sugar increased (also)  . Sulfa Antibiotics  Hives  . Penicillins  Rash    Has patient had a PCN reaction causing immediate rash, facial/tongue/throat swelling, SOB or lightheadedness with hypotension: Yes Has patient had a PCN reaction causing severe rash involving mucus membranes or skin necrosis: No Has patient had a PCN reaction that required hospitalization: No Has patient had a PCN reaction occurring within the last 10 years: No If all of the above answers are "NO", then may proceed with Cephalosporin use.   . Sulfamethoxazole Rash  . Sulfonamide Derivatives Rash     Physical Exam:   Vitals  Blood pressure (!) 132/56, pulse (!) 49, temperature 98.3 F (36.8 C), temperature source Oral, resp. rate 17, height 5\' 2"  (1.575 m), weight 81.6 kg (180 lb), SpO2 100 %.   1. General  lying in bed in NAD,    2. Normal affect and insight, Not Suicidal or Homicidal, Awake Alert, Oriented X 3.  3. No F.N deficits, ALL C.Nerves Intact, Strength 5/5 all 4 extremities, Sensation intact all 4 extremities, Plantars down going.  4. Ears and Eyes appear Normal, Conjunctivae clear, PERRLA. Moist Oral Mucosa.  5. Supple Neck, No JVD, No cervical lymphadenopathy appriciated, No Carotid Bruits.  6. Symmetrical Chest wall movement, Good air movement bilaterally, CTAB.  7. RRR, No Gallops, Rubs or Murmurs, No Parasternal Heave.  8. Positive Bowel Sounds, Abdomen Soft, No tenderness, No organomegaly appriciated,No rebound -guarding or rigidity.  9.  No Cyanosis, Normal Skin Turgor, No Skin Rash or Bruise.  10. Good muscle tone,  joints appear normal , no effusions, Normal ROM.  11. No Palpable Lymph Nodes in Neck or Axillae      Data Review:    CBC Recent Labs  Lab 07/25/17 1713  WBC 9.7  HGB 10.6*  HCT 33.8*  PLT 405*  MCV 86.0  MCH 27.0  MCHC 31.4  RDW 17.4*   ------------------------------------------------------------------------------------------------------------------  Chemistries  Recent Labs  Lab 07/25/17 1713  NA 133*  K 5.9*    CL 105  CO2 20*  GLUCOSE 239*  BUN 33*  CREATININE 1.88*  CALCIUM 9.3   ------------------------------------------------------------------------------------------------------------------ estimated creatinine clearance is 22.8 mL/min (A) (by C-G formula based on SCr of 1.88 mg/dL (H)). ------------------------------------------------------------------------------------------------------------------ No results for input(s): TSH, T4TOTAL, T3FREE, THYROIDAB in the last 72 hours.  Invalid input(s): FREET3  Coagulation profile No results for input(s): INR, PROTIME in the last 168 hours. ------------------------------------------------------------------------------------------------------------------- No results for input(s): DDIMER in the last 72 hours. -------------------------------------------------------------------------------------------------------------------  Cardiac Enzymes No results for input(s): CKMB, TROPONINI, MYOGLOBIN in the last 168 hours.  Invalid input(s): CK ------------------------------------------------------------------------------------------------------------------ No results found for: BNP   ---------------------------------------------------------------------------------------------------------------  Urinalysis    Component Value Date/Time   COLORURINE STRAW (A) 04/08/2016 0124   APPEARANCEUR CLEAR 04/08/2016 0124   LABSPEC 1.007 04/08/2016 0124   PHURINE 7.0 04/08/2016 0124   GLUCOSEU >=500 (A) 04/08/2016 0124   GLUCOSEU NEGATIVE 01/04/2010 1046   HGBUR NEGATIVE 04/08/2016 0124   BILIRUBINUR NEGATIVE 04/08/2016 0124   KETONESUR NEGATIVE 04/08/2016 0124   PROTEINUR NEGATIVE 04/08/2016 0124   UROBILINOGEN 0.2 01/04/2010 1046   NITRITE NEGATIVE 04/08/2016 0124   LEUKOCYTESUR NEGATIVE 04/08/2016 0124    ----------------------------------------------------------------------------------------------------------------   Imaging Results:    Dg  Chest 2 View  Result Date: 07/25/2017 CLINICAL DATA:  Low blood pressure. EXAM: CHEST - 2 VIEW COMPARISON:  April 17, 2013 FINDINGS: The heart size and mediastinal contours are within normal limits. Both lungs are clear. The visualized skeletal structures are unremarkable. IMPRESSION: No active cardiopulmonary disease.  Electronically Signed   By: Dorise Bullion III M.D   On: 07/25/2017 16:53     ekg junctional at 9, st depression in v4-6, t inversion in 2,3, avf   Assessment & Plan:    Principal Problem:   Hyperkalemia Active Problems:   Diabetes mellitus type II, uncontrolled (HCC)   Bradycardia   Hypotension   History of stroke   ARF (acute renal failure) (HCC)   Hyperkalemia STOP Losartan STOP spironolactone Sodium bicarbonate 1 amp iv calclium gluconate iv x1 D5 Kayexalate 30 gm po x1 Check bmp in 2-3 hours Check cmp in am  Bradycardia Likely due to decreased clearance of cardizem HOLD Cardizem Tele Trop I q6h x3 TSH  Hypotension HOLD bp medications Trop I q6h x3 Check cardiac echo If persistent consider cortisol level Hydrate with ns at 16mL   Dm2 STOP Metformin due to ARF fsbs ac and qhs, ISS  H/o Stroke  ? pafib  Cont Xarelto  Anemia, iron deficiency Pt has black stool from iron Continue ferrous sulfate Check cbc in am  DVT Prophylaxis Xarelto, if renal function is too back consider iv heparin temporarily.   AM Labs Ordered, also please review Full Orders  Family Communication: Admission, patients condition and plan of care including tests being ordered have been discussed with the patient who indicate understanding and agree with the plan and Code Status.  Code Status  FULL CODE  Likely DC to  home  Condition GUARDED    Consults called:  none  Admission status: inpatient  Time spent in minutes : 70   Jani Gravel M.D on 07/25/2017 at 7:54 PM  Between 7am to 7pm - Pager - [phone]  . After 7pm go to [email or web address] - password  [facility]  [facility] - Office  [phone]

## 2017-07-26 ENCOUNTER — Inpatient Hospital Stay (HOSPITAL_COMMUNITY): Payer: Medicare HMO

## 2017-07-26 LAB — COMPREHENSIVE METABOLIC PANEL
ALT: 11 U/L (ref 0–44)
ANION GAP: 10 (ref 5–15)
AST: 15 U/L (ref 15–41)
Albumin: 3.1 g/dL — ABNORMAL LOW (ref 3.5–5.0)
Alkaline Phosphatase: 53 U/L (ref 38–126)
BILIRUBIN TOTAL: 0.6 mg/dL (ref 0.3–1.2)
BUN: 21 mg/dL (ref 8–23)
CALCIUM: 9.2 mg/dL (ref 8.9–10.3)
CO2: 22 mmol/L (ref 22–32)
Chloride: 108 mmol/L (ref 98–111)
Creatinine, Ser: 1.38 mg/dL — ABNORMAL HIGH (ref 0.44–1.00)
GFR, EST AFRICAN AMERICAN: 40 mL/min — AB (ref >60)
GFR, EST NON AFRICAN AMERICAN: 35 mL/min — AB (ref >60)
GLUCOSE: 228 mg/dL — AB (ref 70–99)
Potassium: 4.5 mmol/L (ref 3.5–5.1)
Sodium: 140 mmol/L (ref 135–145)
TOTAL PROTEIN: 6.4 g/dL — AB (ref 6.5–8.1)

## 2017-07-26 LAB — TROPONIN I
Troponin I: <0.03 ng/mL (ref <0.03)
Troponin I: <0.03 ng/mL (ref <0.03)

## 2017-07-26 LAB — BASIC METABOLIC PANEL
ANION GAP: 9 (ref 5–15)
BUN: 28 mg/dL — AB (ref 8–23)
CO2: 22 mmol/L (ref 22–32)
Calcium: 9.3 mg/dL (ref 8.9–10.3)
Chloride: 106 mmol/L (ref 98–111)
Creatinine, Ser: 1.58 mg/dL — ABNORMAL HIGH (ref 0.44–1.00)
GFR calc Af Amer: 34 mL/min — ABNORMAL LOW (ref >60)
GFR calc non Af Amer: 29 mL/min — ABNORMAL LOW (ref >60)
GLUCOSE: 223 mg/dL — AB (ref 70–99)
POTASSIUM: 4.6 mmol/L (ref 3.5–5.1)
Sodium: 137 mmol/L (ref 135–145)

## 2017-07-26 LAB — HEMOGLOBIN A1C
HEMOGLOBIN A1C: 10.6 % — AB (ref 4.8–5.6)
MEAN PLASMA GLUCOSE: 257.52 mg/dL

## 2017-07-26 LAB — URINALYSIS, ROUTINE W REFLEX MICROSCOPIC
BILIRUBIN URINE: NEGATIVE
GLUCOSE, UA: 150 mg/dL — AB
HGB URINE DIPSTICK: NEGATIVE
Ketones, ur: NEGATIVE mg/dL
Leukocytes, UA: NEGATIVE
Nitrite: NEGATIVE
Protein, ur: NEGATIVE mg/dL
Specific Gravity, Urine: 1.009 (ref 1.005–1.030)
pH: 8 (ref 5.0–8.0)

## 2017-07-26 LAB — CBC
HEMATOCRIT: 35 % — AB (ref 36.0–46.0)
HEMOGLOBIN: 11.2 g/dL — AB (ref 12.0–15.0)
MCH: 27 pg (ref 26.0–34.0)
MCHC: 32 g/dL (ref 30.0–36.0)
MCV: 84.3 fL (ref 78.0–100.0)
Platelets: 402 10*3/uL — ABNORMAL HIGH (ref 150–400)
RBC: 4.15 MIL/uL (ref 3.87–5.11)
RDW: 17.2 % — AB (ref 11.5–15.5)
WBC: 8.5 10*3/uL (ref 4.0–10.5)

## 2017-07-26 LAB — LIPID PANEL
Cholesterol: 128 mg/dL (ref 0–200)
HDL: 45 mg/dL (ref >40)
LDL Cholesterol: 63 mg/dL (ref 0–99)
Total CHOL/HDL Ratio: 2.8 RATIO
Triglycerides: 101 mg/dL (ref <150)
VLDL: 20 mg/dL (ref 0–40)

## 2017-07-26 LAB — GLUCOSE, CAPILLARY
GLUCOSE-CAPILLARY: 238 mg/dL — AB (ref 70–99)
GLUCOSE-CAPILLARY: 150 mg/dL — AB (ref 70–99)
Glucose-Capillary: 205 mg/dL — ABNORMAL HIGH (ref 70–99)
Glucose-Capillary: 205 mg/dL — ABNORMAL HIGH (ref 70–99)

## 2017-07-26 LAB — ECHOCARDIOGRAM COMPLETE
HEIGHTINCHES: 62 in
WEIGHTICAEL: 2880 [oz_av]

## 2017-07-26 LAB — SODIUM, URINE, RANDOM: Sodium, Ur: 139 mmol/L

## 2017-07-26 MED ORDER — FERROUS SULFATE 325 (65 FE) MG PO TABS
325.0000 mg | ORAL_TABLET | Freq: Every day | ORAL | Status: DC
  Administered 2017-07-26: 325 mg via ORAL
  Filled 2017-07-26: qty 1

## 2017-07-26 NOTE — Discharge Instructions (Signed)
Information on my medicine - XARELTO (Rivaroxaban)  This medication education was reviewed with me or my healthcare representative as part of my discharge preparation.  The pharmacist that spoke with me during my hospital stay was:  Marney Setting, [facility]  Why was Xarelto prescribed for you? Xarelto was prescribed for you to reduce the risk of a blood clot forming that can cause a stroke if you have a medical condition called atrial fibrillation (a type of irregular heartbeat).  What do you need to know about xarelto ? Take your Xarelto ONCE DAILY at the same time every day with your evening meal. If you have difficulty swallowing the tablet whole, you may crush it and mix in applesauce just prior to taking your dose.  Take Xarelto exactly as prescribed by your doctor and DO NOT stop taking Xarelto without talking to the doctor who prescribed the medication.  Stopping without other stroke prevention medication to take the place of Xarelto may increase your risk of developing a clot that causes a stroke.  Refill your prescription before you run out.  After discharge, you should have regular check-up appointments with your healthcare provider that is prescribing your Xarelto.  In the future your dose may need to be changed if your kidney function or weight changes by a significant amount.  What do you do if you miss a dose? If you are taking Xarelto ONCE DAILY and you miss a dose, take it as soon as you remember on the same day then continue your regularly scheduled once daily regimen the next day. Do not take two doses of Xarelto at the same time or on the same day.   Important Safety Information A possible side effect of Xarelto is bleeding. You should call your healthcare provider right away if you experience any of the following: ? Bleeding from an injury or your nose that does not stop. ? Unusual colored urine (red or dark brown) or unusual colored stools (red or black). ? Unusual  bruising for unknown reasons. ? A serious fall or if you hit your head (even if there is no bleeding).  Some medicines may interact with Xarelto and might increase your risk of bleeding while on Xarelto. To help avoid this, consult your healthcare provider or pharmacist prior to using any new prescription or non-prescription medications, including herbals, vitamins, non-steroidal anti-inflammatory drugs (NSAIDs) and supplements.  This website has more information on Xarelto: [email or web address]

## 2017-07-26 NOTE — Progress Notes (Signed)
Subjective:  Patient denies any chest pain or shortness of breath.  States overall feels better now.  States forgets occasionally to take her insulin.  Renal function improving heart rate has also improved  Objective:  Vital Signs in the last 24 hours: Temp:  [98.3 F (36.8 C)] 98.3 F (36.8 C) (07/05 1615) Pulse Rate:  [47-68] 60 (07/05 2000) Resp:  [14-21] 16 (07/05 2000) BP: (89-153)/(48-111) 151/65 (07/05 2000) SpO2:  [96 %-100 %] 100 % (07/05 2000) Weight:  [81.6 kg (180 lb)] 81.6 kg (180 lb) (07/05 1611)  Intake/Output from previous day: 07/05 0701 - 07/06 0700 In: 1440 [P.O.:440; I.V.:1000] Out: -  Intake/Output from this shift: Total I/O In: -  Out: 350 [Urine:350]  Physical Exam: Neck: no adenopathy, no carotid bruit, no JVD and supple, symmetrical, trachea midline Lungs: clear to auscultation bilaterally Heart: regular rate and rhythm, S1, S2 normal and soft systolic and diastolic murmur noted Abdomen: soft, non-tender; bowel sounds normal; no masses,  no organomegaly Extremities: extremities normal, atraumatic, no cyanosis or edema  Lab Results: Recent Labs    07/25/17 1713 07/26/17 0718  WBC 9.7 8.5  HGB 10.6* 11.2*  PLT 405* 402*   Recent Labs    07/25/17 2336 07/26/17 0718  NA 137 140  K 4.6 4.5  CL 106 108  CO2 22 22  GLUCOSE 223* 228*  BUN 28* 21  CREATININE 1.58* 1.38*   Recent Labs    07/25/17 2336 07/26/17 0718  TROPONINI <0.03 <0.03   Hepatic Function Panel Recent Labs    07/26/17 0718  PROT 6.4*  ALBUMIN 3.1*  AST 15  ALT 11  ALKPHOS 53  BILITOT 0.6   Recent Labs    07/26/17 0003  CHOL 128   No results for input(s): PROTIME in the last 72 hours.  Imaging: Imaging results have been reviewed and Dg Chest 2 View  Result Date: 07/25/2017 CLINICAL DATA:  Low blood pressure. EXAM: CHEST - 2 VIEW COMPARISON:  April 17, 2013 FINDINGS: The heart size and mediastinal contours are within normal limits. Both lungs are clear. The  visualized skeletal structures are unremarkable. IMPRESSION: No active cardiopulmonary disease. Electronically Signed   By: Dorise Bullion III M.D   On: 07/25/2017 16:53    Cardiac Studies:  Assessment/Plan:  Resolving acute on chronic kidney injury. Status post hyperkalemia. Status post junctional rhythm, probably secondary to medications. Hypertension. Uncontrolled diabetes mellitus. Hyperlipidemia. History of TIA/CVA. History of paroxysmal A. Fib. Morbid obesity. Plan Continue present management. Reduce clonidine in view of marked bradycardia and hypotension Check labs in a.m.  LOS: 1 day    Charolette Forward 07/26/2017, 10:23 AM

## 2017-07-26 NOTE — Progress Notes (Signed)
*  PRELIMINARY RESULTS* Echocardiogram 2D Echocardiogram has been performed.  Leavy Cella 07/26/2017, 12:09 PM

## 2017-07-27 LAB — BASIC METABOLIC PANEL
ANION GAP: 6 (ref 5–15)
BUN: 18 mg/dL (ref 8–23)
CHLORIDE: 111 mmol/L (ref 98–111)
CO2: 25 mmol/L (ref 22–32)
Calcium: 9.3 mg/dL (ref 8.9–10.3)
Creatinine, Ser: 1.16 mg/dL — ABNORMAL HIGH (ref 0.44–1.00)
GFR calc Af Amer: 49 mL/min — ABNORMAL LOW (ref >60)
GFR calc non Af Amer: 43 mL/min — ABNORMAL LOW (ref >60)
GLUCOSE: 62 mg/dL — AB (ref 70–99)
POTASSIUM: 3.6 mmol/L (ref 3.5–5.1)
Sodium: 142 mmol/L (ref 135–145)

## 2017-07-27 LAB — CBC
HCT: 33.6 % — ABNORMAL LOW (ref 36.0–46.0)
Hemoglobin: 10.9 g/dL — ABNORMAL LOW (ref 12.0–15.0)
MCH: 26.8 pg (ref 26.0–34.0)
MCHC: 32.4 g/dL (ref 30.0–36.0)
MCV: 82.8 fL (ref 78.0–100.0)
Platelets: 431 10*3/uL — ABNORMAL HIGH (ref 150–400)
RBC: 4.06 MIL/uL (ref 3.87–5.11)
RDW: 16.6 % — ABNORMAL HIGH (ref 11.5–15.5)
WBC: 8.9 10*3/uL (ref 4.0–10.5)

## 2017-07-27 LAB — CALCIUM / CREATININE RATIO, URINE
CALCIUM UR: 5.7 mg/dL
CALCIUM/CREAT. RATIO: 191 mg/g{creat} (ref 0–260)
Creatinine, Urine: 29.8 mg/dL

## 2017-07-27 LAB — MAGNESIUM: MAGNESIUM: 1.7 mg/dL (ref 1.7–2.4)

## 2017-07-27 LAB — GLUCOSE, CAPILLARY
GLUCOSE-CAPILLARY: 102 mg/dL — AB (ref 70–99)
Glucose-Capillary: 58 mg/dL — ABNORMAL LOW (ref 70–99)

## 2017-07-27 MED ORDER — RIVAROXABAN 15 MG PO TABS: 15.0000 mg | ORAL_TABLET | Freq: Every day | ORAL | 3 refills | Status: DC

## 2017-07-27 MED ORDER — HYDRALAZINE HCL 20 MG/ML IJ SOLN
10.0000 mg | Freq: Four times a day (QID) | INTRAMUSCULAR | Status: DC | PRN
  Administered 2017-07-27: 10 mg via INTRAVENOUS
  Filled 2017-07-27: qty 1

## 2017-07-27 MED ORDER — CLONIDINE HCL 0.2 MG PO TABS: 0.2000 mg | ORAL_TABLET | Freq: Two times a day (BID) | ORAL | 3 refills | Status: DC

## 2017-07-27 MED ORDER — DILTIAZEM HCL ER COATED BEADS 180 MG PO CP24: 180.0000 mg | ORAL_CAPSULE | Freq: Every day | ORAL | 3 refills | Status: DC

## 2017-07-27 MED ORDER — LABETALOL HCL 5 MG/ML IV SOLN
10.0000 mg | Freq: Once | INTRAVENOUS | Status: AC
  Administered 2017-07-27: 10 mg via INTRAVENOUS
  Filled 2017-07-27: qty 4

## 2017-07-27 MED ORDER — INSULIN DETEMIR 100 UNIT/ML FLEXPEN: 18.0000 [IU] | PEN_INJECTOR | Freq: Two times a day (BID) | SUBCUTANEOUS | 11 refills | Status: DC

## 2017-07-27 NOTE — Progress Notes (Signed)
Dr. Maudie Mercury notified about patient's elevated SBP at 189/98.  Also, the patient has an elevated HR in 130s.  One dose of labetalol ordered and given.  HR returned to SR and SBP returned to 150s.  Will continue to monitor patient.

## 2017-07-27 NOTE — Discharge Summary (Signed)
Discharge summary dictated on 07/27/2017 dictation number is [phone]

## 2017-07-27 NOTE — Progress Notes (Signed)
Nsg Discharge Note  Admit Date:  07/25/2017 Discharge date: 07/27/2017   [name] to be D/C'd Home per MD order.  AVS completed.  Copy for chart, and copy for patient signed, and dated. Patient/caregiver able to verbalize understanding.  Discharge Medication: Allergies as of 07/27/2017      Reactions   Ace Inhibitors Swelling   Angioedema   Prednisone Nausea And Vomiting, Other (See Comments), Hypertension   Blood sugar increased (also)   Sulfa Antibiotics Hives   Penicillins Rash   Has patient had a PCN reaction causing immediate rash, facial/tongue/throat swelling, SOB or lightheadedness with hypotension: Yes Has patient had a PCN reaction causing severe rash involving mucus membranes or skin necrosis: No Has patient had a PCN reaction that required hospitalization: No Has patient had a PCN reaction occurring within the last 10 years: No If all of the above answers are "NO", then may proceed with Cephalosporin use.   Sulfamethoxazole Rash   Sulfonamide Derivatives Rash      Medication List    STOP taking these medications   EMERGEN-C VITAMIN C PO   ketoconazole 2 % cream Commonly known as:  NIZORAL   metFORMIN 850 MG tablet Commonly known as:  GLUCOPHAGE   spironolactone 25 MG tablet Commonly known as:  ALDACTONE     TAKE these medications   cloNIDine 0.2 MG tablet Commonly known as:  CATAPRES Take 1 tablet (0.2 mg total) by mouth 2 (two) times daily. What changed:  when to take this   diltiazem 180 MG 24 hr capsule Commonly known as:  CARTIA XT Take 1 capsule (180 mg total) by mouth daily. What changed:    medication strength  how much to take   feeding supplement (GLUCERNA SHAKE) Liqd Take 237 mLs by mouth See admin instructions. Drink one can (237 mls) by mouth once or twice daily as meal replacement   Insulin Detemir 100 UNIT/ML Pen Commonly known as:  LEVEMIR FLEXTOUCH Inject 18 Units into the skin 2 (two) times daily. Inject 20 units subcutaneously  twice daily - after lunch and supper What changed:    how much to take  when to take this   IRON PO Take 325 mg by mouth daily with lunch.   losartan 100 MG tablet Commonly known as:  COZAAR Take 100 mg by mouth daily.   multivitamin with minerals Tabs tablet Take 1 tablet by mouth daily.   omeprazole 20 MG tablet Commonly known as:  PRILOSEC OTC Take 20 mg by mouth daily with breakfast.   polycarbophil 625 MG tablet Commonly known as:  FIBERCON Take 625 mg by mouth daily as needed for mild constipation.   PROBIOTIC FORMULA PO Take 1 tablet by mouth daily with breakfast.   Rivaroxaban 15 MG Tabs tablet Commonly known as:  XARELTO Take 1 tablet (15 mg total) by mouth daily with supper. What changed:    medication strength  how much to take  when to take this   vitamin B-12 500 MCG tablet Commonly known as:  CYANOCOBALAMIN Take 500 mcg by mouth daily with lunch.   VITAMIN D3 PO Take 1 tablet by mouth daily with lunch.       Discharge Assessment: Vitals:   07/27/17 0310 07/27/17 0534  BP: (!) 154/80 (!) 156/79  Pulse: 82 83  Resp:  18  Temp:  98 F (36.7 C)  SpO2:  100%   Skin clean, dry and intact without evidence of skin break down, no evidence of skin tears  noted. IV catheter discontinued intact. Site without signs and symptoms of complications - no redness or edema noted at insertion site, patient denies c/o pain - only slight tenderness at site.  Dressing with slight pressure applied.  D/c Instructions-Education: Discharge instructions given to patient/family with verbalized understanding. D/c education completed with patient/family including follow up instructions, medication list, d/c activities limitations if indicated, with other d/c instructions as indicated by MD - patient able to verbalize understanding, all questions fully answered. Patient instructed to return to ED, call 911, or call MD for any changes in condition.  Patient escorted via  [place], and D/C home son  via private auto.  Hassan Rowan, RN 07/27/2017 12:16 PM

## 2017-07-27 NOTE — Plan of Care (Signed)
Pt discharged home with son

## 2017-07-27 NOTE — Discharge Summary (Signed)
[name], ARVIZU MEDICAL RECORD [id number] ACCOUNT [id number] DATE OF BIRTH:February 07, 1932 FACILITY: [facility] LOCATION: [facility] PHYSICIAN:Vikash Nest Daivd Council, MD  DISCHARGE SUMMARY  DATE OF DISCHARGE:  07/27/2017  ADMITTING DIAGNOSES: 1.  Acute renal failure. 2.  Hyperkalemia. 3.  Diabetes mellitus. 4.  Bradycardia. 5.  Hypertension. 6.  History of cerebrovascular accident.  FINAL DIAGNOSIS:   1.  Status post acute on chronic injury. 2.  Status post hyperkalemia. 3.  Status post junctional rhythm secondary to medications. 4.  Hypertension. 5.  Diabetes mellitus. 6.  Hyperlipidemia  history of transient ischemic attack/cerebrovascular accident in the past. 7.  History of paroxysmal atrial fibrillation. 8.  Morbid obesity. 9.  Status post hypotension secondary to medications.    DISCHARGE HOME MEDICATIONS:   1.  Glucerna shake 237 mL twice daily as tolerated. 2.  Iron 325 mg daily. 3.  Losartan 100 mg daily. 4.  Multivitamin with minerals one tablet daily. 5.  Omeprazole 20 mg daily. 6.  FiberCon 625 mg as needed for constipation. 7.  Probiotic one tablet daily. 8.  Vitamin B12 500 mcg tablet daily. 9.  Vitamin D3 one tablet daily.   10.  Clonidine dose has been reduced to 0.2 mg twice daily.  11.  Diltiazem dose has been reduced to 180 mg daily Diltiazem XT from 300 to 180 mg daily. 12.  Levemir insulin 18 units twice daily.  13.  Xarelto 15 mg 1 tablet daily  14.  Metformin and spironolactone has been discontinued.  CONDITION AT DISCHARGE:  Stable.  The patient and family has been advised to monitor blood pressure and blood sugar daily and chart.  CONDITION AT DISCHARGE:  Stable.  The patient will be followed up in my office in 1 week.  BRIEF HISTORY:  The patient is an 84 year old female with past medical history significant for multiple medical problems, i.e., hypertension, diabetes mellitus, hyperlipidemia, history of stroke, history of paroxysmal atrial fibrillation,  was admitted by  prior hospitalist because of generalized weakness.  The patient notes that she was just traveling from _____ and over the past 2 weeks and mildly dehydrated from this.  The patient denies any recent medical adjustment.  Does confirm that she is taking  losartan and spironolactone and metformin.  The patient denies any fever, chills, cough, chest pain, palpitations, shortness of breath, nausea, vomiting, abdominal pain, dysuria, hematuria, diarrhea, bright red blood per rectum.  Her stool is black, but  she has been taking iron.  In the ED, her heart rate was in 40s, noted to have junctional rhythm on the monitor and blood pressure 89/48.    PHYSICAL EXAMINATION: GENERAL:  Alert, awake, oriented x3. VITAL SIGNS:  Blood pressure was 132/56, pulse 49.  She was afebrile. HEENT:  Conjunctivae pink. NECK:  Supple, no JVD, no bruit. LUNGS:  Clear to auscultation without rhonchi or rales. CARDIOVASCULAR:  S1, S2 was normal.  She was bradycardic.  There was no murmur, gallop or rub. ABDOMEN:  Soft, nontender. EXTREMITIES:  There is no clubbing, cyanosis or edema. NEUROLOGIC:  Grossly intact.  LABORATORY DATA:  Sodium was 133, potassium 5.9, glucose 239, BUN 33, creatinine 1.88.  Cholesterol was 128, HDL 45, LDL 63, triglycerides 101.  Hemoglobin was 10.6, hematocrit 33.8, white count of 9.7.  Repeat electrolytes sodium 137, potassium 4.6, BUN  28, creatinine 1.58, which is trending down.  This morning, sodium is 142, potassium 3.6, BUN 18, creatinine 1.16.  Blood sugar this morning was 62.  Repeat blood sugar by fingerstick  was 58 and then 102.  Hemoglobin is 10.9, hematocrit 33.6, white  count of 8.9, which is stable.  Two sets of troponin I were negative.    The patient also had 2D echo done which showed normal LV systolic function with mild concentric LVH and grade II diastolic dysfunction.  There was mild aortic regurgitation and mild tricuspid regurgitation.  BRIEF HOSPITAL COURSE:   The patient was admitted to telemetry unit.  MI was ruled out by serial enzymes and EKG.  The patient did not have any chest pain during the hospital stay.  The patient was treated for hyperkalemia and slow hydration with  normalization of her renal function and potassium.  The patient had low blood sugar this morning.  Her Levemir dose has been reduced.  Repeat blood sugar has been above 100.  The patient is ambulating in the room without any problems and is eager to go  home.  The patient will be discharged on above medications and will be followed up in my office closely in 1 week.  The patient has been advised to stop spironolactone, metformin and clonidine, and diltiazem dose has been reduced as per discharge papers.  AN/NUANCE D:07/27/2017 T:07/27/2017 JOB:[id number]

## 2017-07-29 DIAGNOSIS — E785 Hyperlipidemia, unspecified: Secondary | ICD-10-CM | POA: Diagnosis not present

## 2017-07-29 DIAGNOSIS — I10 Essential (primary) hypertension: Secondary | ICD-10-CM | POA: Diagnosis not present

## 2017-07-29 DIAGNOSIS — E119 Type 2 diabetes mellitus without complications: Secondary | ICD-10-CM | POA: Diagnosis not present

## 2017-07-29 DIAGNOSIS — I480 Paroxysmal atrial fibrillation: Secondary | ICD-10-CM | POA: Diagnosis not present

## 2017-08-15 DIAGNOSIS — I10 Essential (primary) hypertension: Secondary | ICD-10-CM | POA: Diagnosis not present

## 2017-08-15 DIAGNOSIS — E785 Hyperlipidemia, unspecified: Secondary | ICD-10-CM | POA: Diagnosis not present

## 2017-08-15 DIAGNOSIS — E119 Type 2 diabetes mellitus without complications: Secondary | ICD-10-CM | POA: Diagnosis not present

## 2017-08-26 DIAGNOSIS — E785 Hyperlipidemia, unspecified: Secondary | ICD-10-CM | POA: Diagnosis not present

## 2017-08-26 DIAGNOSIS — I480 Paroxysmal atrial fibrillation: Secondary | ICD-10-CM | POA: Diagnosis not present

## 2017-08-26 DIAGNOSIS — I10 Essential (primary) hypertension: Secondary | ICD-10-CM | POA: Diagnosis not present

## 2017-08-26 DIAGNOSIS — E119 Type 2 diabetes mellitus without complications: Secondary | ICD-10-CM | POA: Diagnosis not present

## 2017-09-12 DIAGNOSIS — E119 Type 2 diabetes mellitus without complications: Secondary | ICD-10-CM | POA: Diagnosis not present

## 2017-09-12 DIAGNOSIS — E785 Hyperlipidemia, unspecified: Secondary | ICD-10-CM | POA: Diagnosis not present

## 2017-09-12 DIAGNOSIS — I10 Essential (primary) hypertension: Secondary | ICD-10-CM | POA: Diagnosis not present

## 2017-09-12 DIAGNOSIS — I480 Paroxysmal atrial fibrillation: Secondary | ICD-10-CM | POA: Diagnosis not present

## 2017-09-12 DIAGNOSIS — L02421 Furuncle of right axilla: Secondary | ICD-10-CM | POA: Diagnosis not present

## 2017-09-16 ENCOUNTER — Emergency Department (HOSPITAL_COMMUNITY)
Admission: EM | Admit: 2017-09-16 | Discharge: 2017-09-17 | Disposition: A | Discharge status: Home / Self Care | Payer: Medicare HMO | Attending: Emergency Medicine | Admitting: Emergency Medicine

## 2017-09-16 ENCOUNTER — Other Ambulatory Visit: Payer: Self-pay

## 2017-09-16 ENCOUNTER — Encounter (HOSPITAL_COMMUNITY): Payer: Self-pay | Admitting: Emergency Medicine

## 2017-09-16 ENCOUNTER — Emergency Department (HOSPITAL_COMMUNITY): Payer: Medicare HMO

## 2017-09-16 DIAGNOSIS — Z79899 Other long term (current) drug therapy: Secondary | ICD-10-CM | POA: Insufficient documentation

## 2017-09-16 DIAGNOSIS — I10 Essential (primary) hypertension: Secondary | ICD-10-CM | POA: Diagnosis not present

## 2017-09-16 DIAGNOSIS — H5711 Ocular pain, right eye: Secondary | ICD-10-CM | POA: Diagnosis not present

## 2017-09-16 DIAGNOSIS — E119 Type 2 diabetes mellitus without complications: Secondary | ICD-10-CM | POA: Diagnosis not present

## 2017-09-16 DIAGNOSIS — R51 Headache: Secondary | ICD-10-CM | POA: Diagnosis not present

## 2017-09-16 DIAGNOSIS — Z7901 Long term (current) use of anticoagulants: Secondary | ICD-10-CM | POA: Diagnosis not present

## 2017-09-16 DIAGNOSIS — R700 Elevated erythrocyte sedimentation rate: Secondary | ICD-10-CM

## 2017-09-16 NOTE — ED Triage Notes (Signed)
Pt reports having intermittent short sharp pains on the right side of her temple that started around 1930. Pt reports checking her BP at home and reported the BP wasn't registering. Pt has hx htn and is compliant with her bp medications. No neuro deficits noted. Hx of stroke without deficits.

## 2017-09-17 LAB — BASIC METABOLIC PANEL
ANION GAP: 8 (ref 5–15)
BUN: 22 mg/dL (ref 8–23)
CHLORIDE: 103 mmol/L (ref 98–111)
CO2: 24 mmol/L (ref 22–32)
Calcium: 9.4 mg/dL (ref 8.9–10.3)
Creatinine, Ser: 1.14 mg/dL — ABNORMAL HIGH (ref 0.44–1.00)
GFR calc Af Amer: 50 mL/min — ABNORMAL LOW (ref >60)
GFR calc non Af Amer: 44 mL/min — ABNORMAL LOW (ref >60)
GLUCOSE: 246 mg/dL — AB (ref 70–99)
POTASSIUM: 4.4 mmol/L (ref 3.5–5.1)
Sodium: 135 mmol/L (ref 135–145)

## 2017-09-17 LAB — CBC WITH DIFFERENTIAL/PLATELET
BASOS ABS: 0 10*3/uL (ref 0.0–0.1)
Band Neutrophils: 0 %
Basophils Relative: 0 %
Blasts: 0 %
EOS PCT: 1 %
Eosinophils Absolute: 0.1 10*3/uL (ref 0.0–0.7)
HEMATOCRIT: 37.3 % (ref 36.0–46.0)
Hemoglobin: 11.7 g/dL — ABNORMAL LOW (ref 12.0–15.0)
LYMPHS ABS: 4.6 10*3/uL — AB (ref 0.7–4.0)
LYMPHS PCT: 46 %
MCH: 27 pg (ref 26.0–34.0)
MCHC: 31.4 g/dL (ref 30.0–36.0)
MCV: 86.1 fL (ref 78.0–100.0)
METAMYELOCYTES PCT: 0 %
MONOS PCT: 6 %
Monocytes Absolute: 0.6 10*3/uL (ref 0.1–1.0)
Myelocytes: 0 %
NEUTROS ABS: 4.7 10*3/uL (ref 1.7–7.7)
Neutrophils Relative %: 47 %
OTHER: 0 %
PLATELETS: 402 10*3/uL — AB (ref 150–400)
Promyelocytes Relative: 0 %
RBC: 4.33 MIL/uL (ref 3.87–5.11)
RDW: 14.8 % (ref 11.5–15.5)
WBC: 10 10*3/uL (ref 4.0–10.5)
nRBC: 0 /100 WBC

## 2017-09-17 LAB — SEDIMENTATION RATE: Sed Rate: 50 mm/hr — ABNORMAL HIGH (ref 0–22)

## 2017-09-17 NOTE — Discharge Instructions (Signed)
Continue to monitor your blood pressure every day.  Return if your eye pain is getting worse, or if you start having problems with your vision.

## 2017-09-17 NOTE — ED Provider Notes (Signed)
[facility] EMERGENCY DEPARTMENT Provider Note   CSN: [id number] Arrival date & time: 09/16/17  2108     History   Chief Complaint Chief Complaint  Patient presents with  . Hypertension  . Headache    HPI [name] is a 84 y.o. female.  The history is provided by the patient.  Hypertension  Associated symptoms include headaches.  Headache    She has history of hypertension, diabetes, TIA and had a recent hospitalization for renal failure and hyperkalemia.  She comes in with pains around her right eye.  She describes sharp, shooting pain which is only present for 1-2seconds.  It is coming intermittently and started this morning.  Last episode was several hours ago.  She denies any visual change.  She denies any weakness, numbness, tingling.  She tried to check her blood pressure at home, but was getting readings which did not make any sense.  Her daughter brought her blood pressure machine over and found blood pressure 165/85.  Past Medical History:  Diagnosis Date  . ANEMIA, IRON DEFICIENCY 08/11/2008  . ANGIOEDEMA 03/01/2008  . CEREBROVASCULAR ACCIDENT, HX OF 03/01/2008  . CONTACT DERMATITIS&OTHER ECZEMA DUE UNSPEC CAUSE 11/28/2008  . DIABETES MELLITUS 03/01/2008  . Dysuria 01/04/2010  . GERD 03/01/2008  . HYPERSOMNIA 05/30/2009  . HYPERTENSION 03/01/2008  . INTERNAL HEMORRHOIDS 03/01/2008  . KNEE PAIN, RIGHT 09/05/2009  . NONSPECIFIC ABN FINDNG RAD&OTH EXAM BILARY TRCT 03/07/2008  . OBESITY 01/10/2010  . PERIPHERAL EDEMA 08/10/2008  . POLYP, GALLBLADDER 03/07/2008  . SMALL BOWEL OBSTRUCTION, HX OF 03/01/2008  . SNORING 05/11/2009  . Stroke ([place]) 04/08/2016   TIA  . THROMBOCYTOSIS 08/18/2008    Patient Active Problem List   Diagnosis Date Noted  . Hyperkalemia 07/25/2017  . Bradycardia 07/25/2017  . Hypotension 07/25/2017  . History of stroke 07/25/2017  . ARF (acute renal failure) ([place]) 07/25/2017  . TIA (transient ischemic attack) 04/08/2016  .  Hypocalcemia 04/08/2016  . OBESITY 01/10/2010  . DYSURIA 01/04/2010  . KNEE PAIN, RIGHT 09/05/2009  . HYPERSOMNIA 05/30/2009  . URI 05/11/2009  . SNORING 05/11/2009  . CONTACT DERMATITIS&OTHER ECZEMA DUE UNSPEC CAUSE 11/28/2008  . THROMBOCYTOSIS 08/18/2008  . ANEMIA, IRON DEFICIENCY 08/11/2008  . PERIPHERAL EDEMA 08/10/2008  . POLYP, GALLBLADDER 03/07/2008  . NONSPECIFIC ABN FINDNG RAD&OTH EXAM BILARY TRCT 03/07/2008  . Diabetes mellitus type II, uncontrolled ([place]) 03/01/2008  . Essential hypertension 03/01/2008  . INTERNAL HEMORRHOIDS 03/01/2008  . GERD 03/01/2008  . ANGIOEDEMA 03/01/2008  . CEREBROVASCULAR ACCIDENT, HX OF 03/01/2008  . SMALL BOWEL OBSTRUCTION, HX OF 03/01/2008    Past Surgical History:  Procedure Laterality Date  . HERNIA REPAIR  09/14/08   with release [facility]-Dr. Dalbert Batman  . SMALL INTESTINE SURGERY    . 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 Authorizing Provider  Cholecalciferol (VITAMIN D3 PO) Take 1 tablet by mouth daily with lunch.    [provider]  cloNIDine (CATAPRES) 0.2 MG tablet Take 1 tablet (0.2 mg total) by mouth 2 (two) times daily. 07/27/17   Charolette Forward, MD  diltiazem (CARDIZEM CD) 180 MG 24 hr capsule Take 1 capsule (180 mg total) by mouth daily. 07/27/17   Charolette Forward, MD  feeding supplement, GLUCERNA SHAKE, ([place]) LIQD Take  237 mLs by mouth See admin instructions. Drink one can (237 mls) by mouth once or twice daily as meal replacement    [provider]  Insulin Detemir (LEVEMIR FLEXTOUCH) 100 UNIT/ML Pen Inject 18 Units into the skin 2 (two) times daily. Inject 20 units subcutaneously twice daily - after lunch and supper 07/27/17   Charolette Forward, MD  IRON PO Take 325 mg by mouth daily with lunch.     [provider]  losartan (COZAAR)  100 MG tablet Take 100 mg by mouth daily.    [provider]  Multiple Vitamin (MULTIVITAMIN WITH MINERALS) TABS tablet Take 1 tablet by mouth daily.    [provider]  omeprazole (PRILOSEC OTC) 20 MG tablet Take 20 mg by mouth daily with breakfast.     [provider]  polycarbophil (FIBERCON) 625 MG tablet Take 625 mg by mouth daily as needed for mild constipation.    [provider]  Probiotic Product (PROBIOTIC FORMULA PO) Take 1 tablet by mouth daily with breakfast.     [provider]  Rivaroxaban (XARELTO) 15 MG TABS tablet Take 1 tablet (15 mg total) by mouth daily with supper. 07/27/17   Charolette Forward, MD  vitamin B-12 (CYANOCOBALAMIN) 500 MCG tablet Take 500 mcg by mouth daily with lunch.    [provider]    Family History Family History  Problem Relation Age of Onset  . Diabetes Mother   . Heart disease Mother   . Diabetes Sister   . Heart disease Sister   . Colitis Sister   . Kidney disease Brother   . Breast cancer Other        Neice  . Allergies Other        Nephrew  . Asthma Other        Nephrew    Social History Social History   Tobacco Use  . Smoking status: Never Smoker  . Smokeless tobacco: Never Used  Substance Use Topics  . Alcohol use: No  . Drug use: No     Allergies   Ace inhibitors; Prednisone; Sulfa antibiotics; Penicillins; Sulfamethoxazole; and Sulfonamide derivatives   Review of Systems Review of Systems  Neurological: Positive for headaches.  All other systems reviewed and are negative.    Physical Exam Updated Vital Signs BP (!) 195/85 (BP Location: Right Arm)   Pulse 75   Temp (!) 97.5 F (36.4 C) (Oral)   Resp 18   Ht 5\' 2"  (1.575 m)   Wt 77.1 kg   SpO2 99%   BMI 31.09 kg/m   Physical Exam  Nursing note and vitals reviewed.  84 year old female, resting comfortably and in no acute distress. Vital signs are significant for elevated systolic blood pressure. Oxygen  saturation is 99%, which is normal. Head is normocephalic and atraumatic. PERRLA, EOMI. Oropharynx is clear.  No tenderness over the temporal artery or over temporalis muscle. Neck is nontender and supple without adenopathy or JVD.  There are no carotid bruits. Back is nontender and there is no CVA tenderness. Lungs are clear without rales, wheezes, or rhonchi. Chest is nontender. Heart has regular rate and rhythm without murmur. Abdomen is soft, flat, nontender without masses or hepatosplenomegaly and peristalsis is normoactive. Extremities have trace edema, full range of motion is present. Skin is warm and dry without rash. Neurologic: Mental status is normal, cranial nerves are intact, there are no motor or sensory deficits.  ED Treatments / Results  Labs (all labs ordered  are listed, but only abnormal results are displayed) Labs Reviewed - No data to display  EKG None  Radiology Ct Head Wo Contrast  Result Date: 09/16/2017 CLINICAL DATA:  Headache EXAM: CT HEAD WITHOUT CONTRAST TECHNIQUE: Contiguous axial images were obtained from the base of the skull through the vertex without intravenous contrast. COMPARISON:  MRI 04/09/2016, CT 03/29/2016 FINDINGS: Brain: Remote bilateral cerebellar infarcts and chronic moderate periventricular and subcortical microvascular ischemic disease. No large vascular territory infarct, intra-axial mass nor extra-axial fluid. Superficial and central atrophy is noted. No acute intracranial hemorrhage. Partially empty pituitary sella. Vascular: Atherosclerosis of the carotid siphons bilaterally. No hyperdense vessel sign. Skull: No acute skull fracture or suspicious osseous lesions. Sinuses/Orbits: No acute finding. Other: None IMPRESSION: 1. Atrophy with moderate chronic small vessel ischemia. No acute intracranial abnormality. 2. Bilateral chronic cerebellar infarcts. 3. Partially empty pituitary sella. Electronically Signed   By: Ashley Royalty M.D.   On:  09/16/2017 22:24    Procedures Procedures (including critical care time)  Medications Ordered in ED Medications - No data to display   Initial Impression / Assessment and Plan / ED Course  I have reviewed the triage vital signs and the nursing notes.  Pertinent labs & imaging results that were available during my care of the patient were reviewed by me and considered in my medical decision making (see chart for details).  Periocular pain that I doubt is significant.  Pains are only present for a few seconds at a time, and she has no concerning findings on exam.  Will check sedimentation rate to make sure she is not having early stages of temporal arteritis.  Old records are reviewed confirming hospitalization July 5 for acute kidney injury and mild hyperkalemia.  Will check electrolytes and renal function today.  Electrolytes are normal, renal function is almost back to normal.  Sedimentation rate is 50 which is slightly higher than it had been, but not in a range I would expect for temporal arteritis.  Patient is given reassurance and instructed to continue to monitor her blood pressure at home, follow-up with primary care provider in the next week.  Return precautions discussed.  Final Clinical Impressions(s) / ED Diagnoses   Final diagnoses:  Pain around right eye  Essential hypertension  Elevated sed rate    ED Discharge Orders    None       Delora Fuel, MD 93/57/01 [id number]

## 2017-09-29 DIAGNOSIS — E119 Type 2 diabetes mellitus without complications: Secondary | ICD-10-CM | POA: Diagnosis not present

## 2017-09-29 DIAGNOSIS — I480 Paroxysmal atrial fibrillation: Secondary | ICD-10-CM | POA: Diagnosis not present

## 2017-09-29 DIAGNOSIS — Z8673 Personal history of transient ischemic attack (TIA), and cerebral infarction without residual deficits: Secondary | ICD-10-CM | POA: Diagnosis not present

## 2017-09-29 DIAGNOSIS — E785 Hyperlipidemia, unspecified: Secondary | ICD-10-CM | POA: Diagnosis not present

## 2017-09-29 DIAGNOSIS — I10 Essential (primary) hypertension: Secondary | ICD-10-CM | POA: Diagnosis not present

## 2017-10-31 ENCOUNTER — Encounter (HOSPITAL_COMMUNITY): Payer: Self-pay | Admitting: Emergency Medicine

## 2017-10-31 ENCOUNTER — Ambulatory Visit (HOSPITAL_COMMUNITY)
Admission: EM | Admit: 2017-10-31 | Discharge: 2017-10-31 | Disposition: A | Discharge status: Home / Self Care | Payer: Medicare HMO | Attending: Family Medicine | Admitting: Family Medicine

## 2017-10-31 DIAGNOSIS — R030 Elevated blood-pressure reading, without diagnosis of hypertension: Secondary | ICD-10-CM

## 2017-10-31 DIAGNOSIS — N764 Abscess of vulva: Secondary | ICD-10-CM | POA: Diagnosis not present

## 2017-10-31 MED ORDER — DOXYCYCLINE HYCLATE 100 MG PO CAPS: 100.0000 mg | ORAL_CAPSULE | Freq: Two times a day (BID) | ORAL | 0 refills | Status: DC

## 2017-10-31 NOTE — ED Provider Notes (Signed)
[facility]   [id number] 10/31/17 Arrival Time: 39   YQ:MVHQION  SUBJECTIVE:  [name] is a 84 y.o. female who presents with a possible abscess of her left labia. Onset abrupt, approximately 3 days ago. Has tried OTC medications with temporary relief.  Reports previous symptoms in the past. Complains of associated discharge from abscess. Denies fever, chill, nausea, vomiting, redness, or swelling.    ROS: As per HPI.  Past Medical History:  Diagnosis Date  . ANEMIA, IRON DEFICIENCY 08/11/2008  . ANGIOEDEMA 03/01/2008  . CEREBROVASCULAR ACCIDENT, HX OF 03/01/2008  . CONTACT DERMATITIS&OTHER ECZEMA DUE UNSPEC CAUSE 11/28/2008  . DIABETES MELLITUS 03/01/2008  . Dysuria 01/04/2010  . GERD 03/01/2008  . HYPERSOMNIA 05/30/2009  . HYPERTENSION 03/01/2008  . INTERNAL HEMORRHOIDS 03/01/2008  . KNEE PAIN, RIGHT 09/05/2009  . NONSPECIFIC ABN FINDNG RAD&OTH EXAM BILARY TRCT 03/07/2008  . OBESITY 01/10/2010  . PERIPHERAL EDEMA 08/10/2008  . POLYP, GALLBLADDER 03/07/2008  . SMALL BOWEL OBSTRUCTION, HX OF 03/01/2008  . SNORING 05/11/2009  . Stroke ([place]) 04/08/2016   TIA  . THROMBOCYTOSIS 08/18/2008   Past Surgical History:  Procedure Laterality Date  . HERNIA REPAIR  09/14/08   with release [facility]-Dr. Dalbert Batman  . SMALL INTESTINE SURGERY    . 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 Ace Inhibitors Swelling    Angioedema   . Prednisone Nausea And Vomiting, Other (See Comments) and Hypertension    Blood sugar increased (also)  . Sulfa Antibiotics Hives  . Penicillins Rash    Has patient had a PCN reaction causing immediate rash, facial/tongue/throat swelling, SOB or lightheadedness with hypotension: Yes Has patient had a PCN reaction causing severe rash involving mucus membranes or skin necrosis: No Has patient had a PCN reaction that required hospitalization: No Has patient had a PCN reaction occurring within  the last 10 years: No If all of the above answers are "NO", then may proceed with Cephalosporin use.   . Sulfamethoxazole Rash  . Sulfonamide Derivatives Rash   No current facility-administered medications on file prior to encounter.    Current Outpatient Medications on File Prior to Encounter  Medication Sig Dispense Refill  . cephALEXin (KEFLEX) 500 MG capsule Take 500 mg by mouth every 8 (eight) hours.  0  . Cholecalciferol (VITAMIN D3 PO) Take 1 tablet by mouth daily with lunch.    . cloNIDine (CATAPRES) 0.2 MG tablet Take 1 tablet (0.2 mg total) by mouth 2 (two) times daily. 60 tablet 3  . diltiazem (CARDIZEM CD) 180 MG 24 hr capsule Take 1 capsule (180 mg total) by mouth daily. 30 capsule 3  . feeding supplement, GLUCERNA SHAKE, (GLUCERNA SHAKE) LIQD Take 237 mLs by mouth See admin instructions. Drink one can (237 mls) by mouth once or twice daily as meal replacement    . Insulin Detemir (LEVEMIR FLEXTOUCH) 100 UNIT/ML Pen Inject 18 Units into the skin 2 (two) times daily. Inject 20 units subcutaneously twice daily - after lunch and supper (Patient taking differently: Inject 15-23 Units into the skin See admin instructions. Use 23 units every morning and use 15 units every evening) 15 mL 11  . IRON PO Take 325 mg by mouth daily with lunch.     . losartan (COZAAR) 100 MG tablet Take 100 mg by mouth daily.    . metFORMIN (GLUCOPHAGE) 500 MG tablet Take 500 mg by mouth daily.  3  .  Multiple Vitamin (MULTIVITAMIN WITH MINERALS) TABS tablet Take 1 tablet by mouth daily.    Marland Kitchen omeprazole (PRILOSEC OTC) 20 MG tablet Take 20 mg by mouth daily with breakfast.     . polycarbophil (FIBERCON) 625 MG tablet Take 625 mg by mouth daily as needed for mild constipation.    . Probiotic Product (PROBIOTIC FORMULA PO) Take 1 tablet by mouth daily with breakfast.     . Rivaroxaban (XARELTO) 15 MG TABS tablet Take 1 tablet (15 mg total) by mouth daily with supper. 42 tablet 3  . spironolactone (ALDACTONE) 25  MG tablet Take 25 mg by mouth daily.  3  . vitamin B-12 (CYANOCOBALAMIN) 500 MCG tablet Take 500 mcg by mouth daily with lunch.     Social History   Socioeconomic History  . Marital status: Divorced    Spouse name: Not on file  . Number of children: 5  . Years of education: 25  . Highest education level: Not on file  Occupational History    Comment: counselor  Social Needs  . Financial resource strain: Not on file  . Food insecurity:    Worry: Not on file    Inability: Not on file  . Transportation needs:    Medical: Not on file    Non-medical: Not on file  Tobacco Use  . Smoking status: Never Smoker  . Smokeless tobacco: Never Used  Substance and Sexual Activity  . Alcohol use: No  . Drug use: No  . Sexual activity: Not on file  Lifestyle  . Physical activity:    Days per week: Not on file    Minutes per session: Not on file  . Stress: Not on file  Relationships  . Social connections:    Talks on phone: Not on file    Gets together: Not on file    Attends religious service: Not on file    Active member of club or organization: Not on file    Attends meetings of clubs or organizations: Not on file    Relationship status: Not on file  . Intimate partner violence:    Fear of current or ex partner: Not on file    Emotionally abused: Not on file    Physically abused: Not on file    Forced sexual activity: Not on file  Other Topics Concern  . Not on file  Social History Narrative   Lives with daughter, [name]   Family History  Problem Relation Age of Onset  . Diabetes Mother   . Heart disease Mother   . Diabetes Sister   . Heart disease Sister   . Colitis Sister   . Kidney disease Brother   . Breast cancer Other        Neice  . Allergies Other        Nephrew  . Asthma Other        Nephrew    OBJECTIVE:  Vitals:   10/31/17 1406  BP: (!) 168/89  Pulse: 83  Resp: 18  Temp: (!) 97 F (36.1 C)  TempSrc: Oral  SpO2: 100%     General appearance:  alert; no distress; nontoxic appearance Lungs: CTAB Heart: RRR Skin: Declines chaperone: 1 cm area of induration of her left labia; tender to touch; no active drainage Psychological: alert and cooperative; normal mood and affect   ASSESSMENT & PLAN:  1. Abscess of left genital labia   2. Elevated blood pressure reading     Meds ordered this encounter  Medications  .  doxycycline (VIBRAMYCIN) 100 MG capsule    Sig: Take 1 capsule (100 mg total) by mouth 2 (two) times daily.    Dispense:  20 capsule    Refill:  0    Order Specific Question:   Supervising Provider    Answer:   Wynona Luna [[id number]]    Declines incision and drainage today.  Would like to try antibiotics and warm compresses.   Apply warm compresses 3-4x daily for 10-15 minutes Wash site daily with warm water and mild soap Keep covered to avoid friction Take antibiotic as prescribed and to completion Follow up here or with PCP if symptoms persists Return or go to the ED if you have any new or worsening symptoms  Blood pressure elevated in office.  Please recheck in 24 hours.  If it continues to be greater than 140/90 please follow up with PCP for further evaluation and management.   Reviewed expectations re: course of current medical issues. Questions answered. Outlined signs and symptoms indicating need for more acute intervention. Patient verbalized understanding. After Visit Summary given.          Lestine Box, PA-C 10/31/17 1430

## 2017-10-31 NOTE — ED Triage Notes (Signed)
Pt here for abscess to vaginal area that is draining

## 2017-10-31 NOTE — Discharge Instructions (Addendum)
Declines incision and drainage today.  Would like to try antibiotics and warm compresses.   Apply warm compresses 3-4x daily for 10-15 minutes Wash site daily with warm water and mild soap Keep covered to avoid friction Take antibiotic as prescribed and to completion Follow up here or with PCP if symptoms persists Return or go to the ED if you have any new or worsening symptoms  Blood pressure elevated in office.  Please recheck in 24 hours.  If it continues to be greater than 140/90 please follow up with PCP for further evaluation and management.

## 2017-11-06 ENCOUNTER — Ambulatory Visit (HOSPITAL_COMMUNITY)
Admission: EM | Admit: 2017-11-06 | Discharge: 2017-11-06 | Disposition: A | Discharge status: Home / Self Care | Payer: Medicare HMO | Attending: Family Medicine | Admitting: Family Medicine

## 2017-11-06 ENCOUNTER — Encounter (HOSPITAL_COMMUNITY): Payer: Self-pay

## 2017-11-06 DIAGNOSIS — L0291 Cutaneous abscess, unspecified: Secondary | ICD-10-CM

## 2017-11-06 DIAGNOSIS — Z794 Long term (current) use of insulin: Secondary | ICD-10-CM | POA: Insufficient documentation

## 2017-11-06 DIAGNOSIS — Z79899 Other long term (current) drug therapy: Secondary | ICD-10-CM | POA: Insufficient documentation

## 2017-11-06 DIAGNOSIS — Z8673 Personal history of transient ischemic attack (TIA), and cerebral infarction without residual deficits: Secondary | ICD-10-CM | POA: Diagnosis not present

## 2017-11-06 DIAGNOSIS — D473 Essential (hemorrhagic) thrombocythemia: Secondary | ICD-10-CM | POA: Diagnosis not present

## 2017-11-06 DIAGNOSIS — N764 Abscess of vulva: Secondary | ICD-10-CM | POA: Insufficient documentation

## 2017-11-06 DIAGNOSIS — Z8249 Family history of ischemic heart disease and other diseases of the circulatory system: Secondary | ICD-10-CM | POA: Insufficient documentation

## 2017-11-06 DIAGNOSIS — I10 Essential (primary) hypertension: Secondary | ICD-10-CM | POA: Insufficient documentation

## 2017-11-06 DIAGNOSIS — E119 Type 2 diabetes mellitus without complications: Secondary | ICD-10-CM | POA: Insufficient documentation

## 2017-11-06 DIAGNOSIS — E669 Obesity, unspecified: Secondary | ICD-10-CM | POA: Insufficient documentation

## 2017-11-06 DIAGNOSIS — K219 Gastro-esophageal reflux disease without esophagitis: Secondary | ICD-10-CM | POA: Diagnosis not present

## 2017-11-06 DIAGNOSIS — L02214 Cutaneous abscess of groin: Secondary | ICD-10-CM | POA: Diagnosis not present

## 2017-11-06 DIAGNOSIS — D509 Iron deficiency anemia, unspecified: Secondary | ICD-10-CM | POA: Diagnosis not present

## 2017-11-06 DIAGNOSIS — Z7901 Long term (current) use of anticoagulants: Secondary | ICD-10-CM | POA: Insufficient documentation

## 2017-11-06 NOTE — Discharge Instructions (Signed)
Your abscess is draining well which should allow it to heal.  Please complete course of antibiotics previously prescribed, take it twice a day.  Warm compresses to the area 3-4 times a day to help promote drainage.  Please follow up here or with your primary care provider for recheck for any worsening or persistent symptoms.

## 2017-11-06 NOTE — ED Triage Notes (Signed)
Pt states she has a boil near her vaginal area.

## 2017-11-06 NOTE — ED Provider Notes (Signed)
[place]    CSN: [id number] Arrival date & time: 11/06/17  0844     History   Chief Complaint Chief Complaint  Patient presents with  . Recurrent Skin Infections    HPI [name] is a 84 y.o. female.   [name] presents with complaints of abscess to left groin/pelvic area. She was seen here 10/11 for similar abscess to left labia, which has improved. However a new affected area developed a few days after that visit. Drainage started last night, noted drainage this morning when she woke. Pain has improved some. Was previously prescribed doxycycline, states she has not completed and has skipped some doses. Has had previous similar abscesses in the past. Also applied Boil-Ease topically which helps some. No fevers. Hx of gerd, htn, stroke, thrombocytosis, DM, angioedema.     ROS per HPI.      Past Medical History:  Diagnosis Date  . ANEMIA, IRON DEFICIENCY 08/11/2008  . ANGIOEDEMA 03/01/2008  . CEREBROVASCULAR ACCIDENT, HX OF 03/01/2008  . CONTACT DERMATITIS&OTHER ECZEMA DUE UNSPEC CAUSE 11/28/2008  . DIABETES MELLITUS 03/01/2008  . Dysuria 01/04/2010  . GERD 03/01/2008  . HYPERSOMNIA 05/30/2009  . HYPERTENSION 03/01/2008  . INTERNAL HEMORRHOIDS 03/01/2008  . KNEE PAIN, RIGHT 09/05/2009  . NONSPECIFIC ABN FINDNG RAD&OTH EXAM BILARY TRCT 03/07/2008  . OBESITY 01/10/2010  . PERIPHERAL EDEMA 08/10/2008  . POLYP, GALLBLADDER 03/07/2008  . SMALL BOWEL OBSTRUCTION, HX OF 03/01/2008  . SNORING 05/11/2009  . Stroke ([place]) 04/08/2016   TIA  . THROMBOCYTOSIS 08/18/2008    Patient Active Problem List   Diagnosis Date Noted  . Hyperkalemia 07/25/2017  . Bradycardia 07/25/2017  . Hypotension 07/25/2017  . History of stroke 07/25/2017  . ARF (acute renal failure) ([place]) 07/25/2017  . TIA (transient ischemic attack) 04/08/2016  . Hypocalcemia 04/08/2016  . OBESITY 01/10/2010  . DYSURIA 01/04/2010  . KNEE PAIN, RIGHT 09/05/2009  . HYPERSOMNIA 05/30/2009  . URI 05/11/2009  .  SNORING 05/11/2009  . CONTACT DERMATITIS&OTHER ECZEMA DUE UNSPEC CAUSE 11/28/2008  . THROMBOCYTOSIS 08/18/2008  . ANEMIA, IRON DEFICIENCY 08/11/2008  . PERIPHERAL EDEMA 08/10/2008  . POLYP, GALLBLADDER 03/07/2008  . NONSPECIFIC ABN FINDNG RAD&OTH EXAM BILARY TRCT 03/07/2008  . Diabetes mellitus type II, uncontrolled ([place]) 03/01/2008  . Essential hypertension 03/01/2008  . INTERNAL HEMORRHOIDS 03/01/2008  . GERD 03/01/2008  . ANGIOEDEMA 03/01/2008  . CEREBROVASCULAR ACCIDENT, HX OF 03/01/2008  . SMALL BOWEL OBSTRUCTION, HX OF 03/01/2008    Past Surgical History:  Procedure Laterality Date  . HERNIA REPAIR  09/14/08   with release [facility]-Dr. Dalbert Batman  . SMALL INTESTINE SURGERY    . 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 Authorizing Provider  cephALEXin (KEFLEX) 500 MG capsule Take 500 mg by mouth every 8 (eight) hours. 09/12/17   [provider]  Cholecalciferol (VITAMIN D3 PO) Take 1 tablet by mouth daily with lunch.    [provider]  cloNIDine (CATAPRES) 0.2 MG tablet Take 1 tablet (0.2 mg total) by mouth 2 (two) times daily. 07/27/17   Charolette Forward, MD  diltiazem (CARDIZEM CD) 180 MG 24 hr capsule Take 1 capsule (180 mg total) by mouth daily. 07/27/17   Charolette Forward, MD  doxycycline (VIBRAMYCIN) 100 MG capsule Take 1 capsule (100 mg total) by  mouth 2 (two) times daily. 10/31/17   Wurst, [place], PA-C  feeding supplement, GLUCERNA SHAKE, (GLUCERNA SHAKE) LIQD Take 237 mLs by mouth See admin instructions. Drink one can (237 mls) by mouth once or twice daily as meal replacement    [provider]  Insulin Detemir (LEVEMIR FLEXTOUCH) 100 UNIT/ML Pen Inject 18 Units into the skin 2 (two) times daily. Inject 20 units subcutaneously twice daily - after lunch and supper Patient taking differently:  Inject 15-23 Units into the skin See admin instructions. Use 23 units every morning and use 15 units every evening 07/27/17   Charolette Forward, MD  IRON PO Take 325 mg by mouth daily with lunch.     [provider]  losartan (COZAAR) 100 MG tablet Take 100 mg by mouth daily.    [provider]  metFORMIN (GLUCOPHAGE) 500 MG tablet Take 500 mg by mouth daily. 08/26/17   [provider]  Multiple Vitamin (MULTIVITAMIN WITH MINERALS) TABS tablet Take 1 tablet by mouth daily.    [provider]  omeprazole (PRILOSEC OTC) 20 MG tablet Take 20 mg by mouth daily with breakfast.     [provider]  polycarbophil (FIBERCON) 625 MG tablet Take 625 mg by mouth daily as needed for mild constipation.    [provider]  Probiotic Product (PROBIOTIC FORMULA PO) Take 1 tablet by mouth daily with breakfast.     [provider]  Rivaroxaban (XARELTO) 15 MG TABS tablet Take 1 tablet (15 mg total) by mouth daily with supper. 07/27/17   Charolette Forward, MD  spironolactone (ALDACTONE) 25 MG tablet Take 25 mg by mouth daily. 08/18/17   [provider]  vitamin B-12 (CYANOCOBALAMIN) 500 MCG tablet Take 500 mcg by mouth daily with lunch.    [provider]    Family History Family History  Problem Relation Age of Onset  . Diabetes Mother   . Heart disease Mother   . Diabetes Sister   . Heart disease Sister   . Colitis Sister   . Kidney disease Brother   . Breast cancer Other        Neice  . Allergies Other        Nephrew  . Asthma Other        Nephrew    Social History Social History   Tobacco Use  . Smoking status: Never Smoker  . Smokeless tobacco: Never Used  Substance Use Topics  . Alcohol use: No  . Drug use: No     Allergies   Ace inhibitors; Prednisone; Sulfa antibiotics; Penicillins; Sulfamethoxazole; and Sulfonamide derivatives   Review of Systems Review of Systems   Physical Exam Triage Vital Signs ED Triage  Vitals  Enc Vitals Group     BP 11/06/17 0924 140/78     Pulse Rate 11/06/17 0924 62     Resp 11/06/17 0924 18     Temp 11/06/17 0924 97.8 F (36.6 C)     Temp Source 11/06/17 0924 Oral     SpO2 --      Weight 11/06/17 0926 182 lb (82.6 kg)     Height --      Head Circumference --      Peak Flow --      Pain Score --      Pain Loc --      Pain Edu? --      Excl. in [place]? --    No data found.  Updated Vital Signs BP 140/78 (BP  Location: Left Arm)   Pulse 62   Temp 97.8 F (36.6 C) (Oral)   Resp 18   Wt 182 lb (82.6 kg)   BMI 33.29 kg/m      Physical Exam  Constitutional: She is oriented to person, place, and time. She appears well-developed and well-nourished. No distress.  Cardiovascular: Normal rate, regular rhythm and normal heart sounds.  Pulmonary/Chest: Effort normal and breath sounds normal.  Genitourinary:     Genitourinary Comments: Left inner pelvis with draining abscess, soft with mild fluctuance; area approximately 1 cm in length; purulent discharge; no induration or surrounding redness; previous healed abscess noted to left labia   Neurological: She is alert and oriented to person, place, and time.  Skin: Skin is warm and dry.     UC Treatments / Results  Labs (all labs ordered are listed, but only abnormal results are displayed) Labs Reviewed  AEROBIC CULTURE (SUPERFICIAL SPECIMEN)    EKG None  Radiology No results found.  Procedures Procedures (including critical care time)  Medications Ordered in UC Medications - No data to display  Initial Impression / Assessment and Plan / UC Course  I have reviewed the triage vital signs and the nursing notes.  Pertinent labs & imaging results that were available during my care of the patient were reviewed by me and considered in my medical decision making (see chart for details).     Well draining abscess with only minimal fluctuance. Deferred any further incision at this time. Culture obtained  as is on doxycycline, although has not been taking regularly. Encouraged to complete course, continue with warm compresses to continue to promote drainage. Return precautions provided. Patient verbalized understanding and agreeable to plan.   Final Clinical Impressions(s) / UC Diagnoses   Final diagnoses:  Abscess     Discharge Instructions     Your abscess is draining well which should allow it to heal.  Please complete course of antibiotics previously prescribed, take it twice a day.  Warm compresses to the area 3-4 times a day to help promote drainage.  Please follow up here or with your primary care provider for recheck for any worsening or persistent symptoms.    ED Prescriptions    None     Controlled Substance Prescriptions [place] Controlled Substance Registry consulted? Not Applicable   Zigmund Gottron, NP 11/06/17 1024

## 2017-11-08 LAB — AEROBIC CULTURE  (SUPERFICIAL SPECIMEN)

## 2017-11-08 LAB — AEROBIC CULTURE W GRAM STAIN (SUPERFICIAL SPECIMEN)

## 2017-11-25 DIAGNOSIS — I10 Essential (primary) hypertension: Secondary | ICD-10-CM | POA: Diagnosis not present

## 2017-11-25 DIAGNOSIS — E119 Type 2 diabetes mellitus without complications: Secondary | ICD-10-CM | POA: Diagnosis not present

## 2017-11-25 DIAGNOSIS — I480 Paroxysmal atrial fibrillation: Secondary | ICD-10-CM | POA: Diagnosis not present

## 2017-11-25 DIAGNOSIS — E785 Hyperlipidemia, unspecified: Secondary | ICD-10-CM | POA: Diagnosis not present

## 2017-12-21 DIAGNOSIS — R569 Unspecified convulsions: Secondary | ICD-10-CM

## 2017-12-21 HISTORY — DX: Unspecified convulsions: R56.9

## 2017-12-31 ENCOUNTER — Encounter (HOSPITAL_COMMUNITY): Payer: Self-pay | Admitting: Radiology

## 2017-12-31 ENCOUNTER — Other Ambulatory Visit: Payer: Self-pay

## 2017-12-31 ENCOUNTER — Emergency Department (HOSPITAL_COMMUNITY): Payer: Medicare HMO

## 2017-12-31 ENCOUNTER — Emergency Department (HOSPITAL_COMMUNITY)
Admission: EM | Admit: 2017-12-31 | Discharge: 2017-12-31 | Disposition: A | Discharge status: Home / Self Care | Payer: Medicare HMO | Source: Home / Self Care | Attending: Emergency Medicine | Admitting: Emergency Medicine

## 2017-12-31 DIAGNOSIS — Z794 Long term (current) use of insulin: Secondary | ICD-10-CM | POA: Insufficient documentation

## 2017-12-31 DIAGNOSIS — R739 Hyperglycemia, unspecified: Secondary | ICD-10-CM

## 2017-12-31 DIAGNOSIS — E1165 Type 2 diabetes mellitus with hyperglycemia: Secondary | ICD-10-CM | POA: Diagnosis not present

## 2017-12-31 DIAGNOSIS — I10 Essential (primary) hypertension: Secondary | ICD-10-CM

## 2017-12-31 DIAGNOSIS — E119 Type 2 diabetes mellitus without complications: Secondary | ICD-10-CM

## 2017-12-31 DIAGNOSIS — R4781 Slurred speech: Secondary | ICD-10-CM | POA: Diagnosis not present

## 2017-12-31 DIAGNOSIS — R471 Dysarthria and anarthria: Secondary | ICD-10-CM

## 2017-12-31 DIAGNOSIS — Z79899 Other long term (current) drug therapy: Secondary | ICD-10-CM

## 2017-12-31 LAB — URINALYSIS, ROUTINE W REFLEX MICROSCOPIC
Bacteria, UA: NONE SEEN
Bilirubin Urine: NEGATIVE
Glucose, UA: >=500 mg/dL — AB
Hgb urine dipstick: NEGATIVE
KETONES UR: NEGATIVE mg/dL
Leukocytes, UA: NEGATIVE
Nitrite: NEGATIVE
Protein, ur: NEGATIVE mg/dL
Specific Gravity, Urine: 1.014 (ref 1.005–1.030)
pH: 6 (ref 5.0–8.0)

## 2017-12-31 LAB — DIFFERENTIAL
Abs Immature Granulocytes: 0.01 10*3/uL (ref 0.00–0.07)
BASOS ABS: 0.1 10*3/uL (ref 0.0–0.1)
BASOS PCT: 1 %
Eosinophils Absolute: 0.3 10*3/uL (ref 0.0–0.5)
Eosinophils Relative: 3 %
Immature Granulocytes: 0 %
Lymphocytes Relative: 47 %
Lymphs Abs: 4.3 10*3/uL — ABNORMAL HIGH (ref 0.7–4.0)
MONOS PCT: 10 %
Monocytes Absolute: 0.9 10*3/uL (ref 0.1–1.0)
Neutro Abs: 3.6 10*3/uL (ref 1.7–7.7)
Neutrophils Relative %: 39 %

## 2017-12-31 LAB — CBC
HCT: 39.3 % (ref 36.0–46.0)
Hemoglobin: 12.4 g/dL (ref 12.0–15.0)
MCH: 26.4 pg (ref 26.0–34.0)
MCHC: 31.6 g/dL (ref 30.0–36.0)
MCV: 83.6 fL (ref 80.0–100.0)
Platelets: 359 10*3/uL (ref 150–400)
RBC: 4.7 MIL/uL (ref 3.87–5.11)
RDW: 15.6 % — ABNORMAL HIGH (ref 11.5–15.5)
WBC: 9.2 10*3/uL (ref 4.0–10.5)
nRBC: 0 % (ref 0.0–0.2)

## 2017-12-31 LAB — RAPID URINE DRUG SCREEN, HOSP PERFORMED
Amphetamines: NOT DETECTED
Barbiturates: NOT DETECTED
Benzodiazepines: NOT DETECTED
Cocaine: NOT DETECTED
OPIATES: NOT DETECTED
Tetrahydrocannabinol: NOT DETECTED

## 2017-12-31 LAB — COMPREHENSIVE METABOLIC PANEL
ALT: 17 U/L (ref 0–44)
AST: 16 U/L (ref 15–41)
Albumin: 3.5 g/dL (ref 3.5–5.0)
Alkaline Phosphatase: 94 U/L (ref 38–126)
Anion gap: 10 (ref 5–15)
BUN: 31 mg/dL — ABNORMAL HIGH (ref 8–23)
CO2: 21 mmol/L — ABNORMAL LOW (ref 22–32)
Calcium: 9.6 mg/dL (ref 8.9–10.3)
Chloride: 97 mmol/L — ABNORMAL LOW (ref 98–111)
Creatinine, Ser: 1.4 mg/dL — ABNORMAL HIGH (ref 0.44–1.00)
GFR calc Af Amer: 40 mL/min — ABNORMAL LOW (ref >60)
GFR calc non Af Amer: 35 mL/min — ABNORMAL LOW (ref >60)
Glucose, Bld: 512 mg/dL (ref 70–99)
POTASSIUM: 5.2 mmol/L — AB (ref 3.5–5.1)
Sodium: 128 mmol/L — ABNORMAL LOW (ref 135–145)
TOTAL PROTEIN: 7.7 g/dL (ref 6.5–8.1)
Total Bilirubin: 0.4 mg/dL (ref 0.3–1.2)

## 2017-12-31 LAB — I-STAT CHEM 8, ED
BUN: 48 mg/dL — ABNORMAL HIGH (ref 8–23)
Calcium, Ion: 1.15 mmol/L (ref 1.15–1.40)
Chloride: 99 mmol/L (ref 98–111)
Creatinine, Ser: 1.3 mg/dL — ABNORMAL HIGH (ref 0.44–1.00)
Glucose, Bld: 530 mg/dL (ref 70–99)
HCT: 42 % (ref 36.0–46.0)
Hemoglobin: 14.3 g/dL (ref 12.0–15.0)
Potassium: 6.1 mmol/L — ABNORMAL HIGH (ref 3.5–5.1)
Sodium: 128 mmol/L — ABNORMAL LOW (ref 135–145)
TCO2: 26 mmol/L (ref 22–32)

## 2017-12-31 LAB — APTT: aPTT: 29 seconds (ref 24–36)

## 2017-12-31 LAB — CBG MONITORING, ED: Glucose-Capillary: 436 mg/dL — ABNORMAL HIGH (ref 70–99)

## 2017-12-31 LAB — I-STAT TROPONIN, ED: Troponin i, poc: 0.01 ng/mL (ref 0.00–0.08)

## 2017-12-31 LAB — PROTIME-INR
INR: 1.18
Prothrombin Time: 14.9 seconds (ref 11.4–15.2)

## 2017-12-31 LAB — ETHANOL: Alcohol, Ethyl (B): <10 mg/dL (ref <10)

## 2017-12-31 MED ORDER — INSULIN DETEMIR 100 UNIT/ML ~~LOC~~ SOLN
20.0000 [IU] | Freq: Once | SUBCUTANEOUS | Status: AC
  Administered 2017-12-31: 20 [IU] via SUBCUTANEOUS
  Filled 2017-12-31 (×2): qty 0.2

## 2017-12-31 NOTE — ED Notes (Signed)
Patient transported to CT 

## 2017-12-31 NOTE — ED Notes (Signed)
Patient verbalizes understanding of medications and discharge instructions. No further questions at this time. VSS and patient ambulatory at discharge.   

## 2017-12-31 NOTE — ED Notes (Signed)
Patient transported to MRI 

## 2017-12-31 NOTE — ED Notes (Signed)
Patient ambulated to bathroom with minimal assistance. Steady gait noted.  

## 2017-12-31 NOTE — ED Notes (Signed)
Paged Facilities for Call [facility]

## 2017-12-31 NOTE — ED Provider Notes (Signed)
[place] EMERGENCY DEPARTMENT Provider Note   CSN: [id number] Arrival date & time: 12/31/17  1650     History   Chief Complaint Chief Complaint  Patient presents with  . Aphasia    HPI [name] is a 84 y.o. female.  HPI   84 year old female with a history of hypertension, TIA, diabetes, presents with concern for episodes of difficulty speaking.  Patient was at the bank earlier today, was discussing her 57 year old son who is very sick, became emotional while she was talking to the teller, and developed slurred speech.  The teller noted the change, and told her that she was not to leave.  She returned to baseline after only a few seconds, and when her family was driving her in the car, she again developed an episode of slurred speech, where it was difficult to understand her, which lasted less than 1 minute and resolved.  Family reports that they had her repeat what she was trying to say 2-3 times, and after that, she returned to baseline.  They denied any other associated symptoms, including no weakness, facial droop, and change in vision or numbness.  She denies any dizziness.  Denies any medication changes.  No recent illness with exception of mild diarrhea over the last 3 days, where she had 3-4 episodes per day.  Denies black or bloody stools, nausea or vomiting.    Past Medical History:  Diagnosis Date  . ANEMIA, IRON DEFICIENCY 08/11/2008  . ANGIOEDEMA 03/01/2008  . CEREBROVASCULAR ACCIDENT, HX OF 03/01/2008  . CONTACT DERMATITIS&OTHER ECZEMA DUE UNSPEC CAUSE 11/28/2008  . DIABETES MELLITUS 03/01/2008  . Dysuria 01/04/2010  . GERD 03/01/2008  . HYPERSOMNIA 05/30/2009  . HYPERTENSION 03/01/2008  . INTERNAL HEMORRHOIDS 03/01/2008  . KNEE PAIN, RIGHT 09/05/2009  . NONSPECIFIC ABN FINDNG RAD&OTH EXAM BILARY TRCT 03/07/2008  . OBESITY 01/10/2010  . PERIPHERAL EDEMA 08/10/2008  . POLYP, GALLBLADDER 03/07/2008  . SMALL BOWEL OBSTRUCTION, HX OF 03/01/2008  . SNORING  05/11/2009  . Stroke ([place]) 04/08/2016   TIA  . THROMBOCYTOSIS 08/18/2008    Patient Active Problem List   Diagnosis Date Noted  . Hyperkalemia 07/25/2017  . Bradycardia 07/25/2017  . Hypotension 07/25/2017  . History of stroke 07/25/2017  . ARF (acute renal failure) ([place]) 07/25/2017  . TIA (transient ischemic attack) 04/08/2016  . Hypocalcemia 04/08/2016  . OBESITY 01/10/2010  . DYSURIA 01/04/2010  . KNEE PAIN, RIGHT 09/05/2009  . HYPERSOMNIA 05/30/2009  . URI 05/11/2009  . SNORING 05/11/2009  . CONTACT DERMATITIS&OTHER ECZEMA DUE UNSPEC CAUSE 11/28/2008  . THROMBOCYTOSIS 08/18/2008  . ANEMIA, IRON DEFICIENCY 08/11/2008  . PERIPHERAL EDEMA 08/10/2008  . POLYP, GALLBLADDER 03/07/2008  . NONSPECIFIC ABN FINDNG RAD&OTH EXAM BILARY TRCT 03/07/2008  . Diabetes mellitus type II, uncontrolled ([place]) 03/01/2008  . Essential hypertension 03/01/2008  . INTERNAL HEMORRHOIDS 03/01/2008  . GERD 03/01/2008  . ANGIOEDEMA 03/01/2008  . CEREBROVASCULAR ACCIDENT, HX OF 03/01/2008  . SMALL BOWEL OBSTRUCTION, HX OF 03/01/2008    Past Surgical History:  Procedure Laterality Date  . HERNIA REPAIR  09/14/08   with release [facility]-Dr. Dalbert Batman  . SMALL INTESTINE SURGERY    . 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 Authorizing Provider  cephALEXin (KEFLEX) 500 MG capsule Take 500 mg  by mouth every 8 (eight) hours. 09/12/17   [provider]  Cholecalciferol (VITAMIN D3 PO) Take 1 tablet by mouth daily with lunch.    [provider]  cloNIDine (CATAPRES) 0.2 MG tablet Take 1 tablet (0.2 mg total) by mouth 2 (two) times daily. 07/27/17   Charolette Forward, MD  diltiazem (CARDIZEM CD) 180 MG 24 hr capsule Take 1 capsule (180 mg total) by mouth daily. 07/27/17   Charolette Forward, MD  doxycycline (VIBRAMYCIN) 100 MG capsule  Take 1 capsule (100 mg total) by mouth 2 (two) times daily. 10/31/17   Wurst, [place], PA-C  feeding supplement, GLUCERNA SHAKE, (GLUCERNA SHAKE) LIQD Take 237 mLs by mouth See admin instructions. Drink one can (237 mls) by mouth once or twice daily as meal replacement    [provider]  Insulin Detemir (LEVEMIR FLEXTOUCH) 100 UNIT/ML Pen Inject 18 Units into the skin 2 (two) times daily. Inject 20 units subcutaneously twice daily - after lunch and supper Patient taking differently: Inject 15-23 Units into the skin See admin instructions. Use 23 units every morning and use 15 units every evening 07/27/17   Charolette Forward, MD  IRON PO Take 325 mg by mouth daily with lunch.     [provider]  losartan (COZAAR) 100 MG tablet Take 100 mg by mouth daily.    [provider]  metFORMIN (GLUCOPHAGE) 500 MG tablet Take 500 mg by mouth daily. 08/26/17   [provider]  Multiple Vitamin (MULTIVITAMIN WITH MINERALS) TABS tablet Take 1 tablet by mouth daily.    [provider]  omeprazole (PRILOSEC OTC) 20 MG tablet Take 20 mg by mouth daily with breakfast.     [provider]  polycarbophil (FIBERCON) 625 MG tablet Take 625 mg by mouth daily as needed for mild constipation.    [provider]  Probiotic Product (PROBIOTIC FORMULA PO) Take 1 tablet by mouth daily with breakfast.     [provider]  Rivaroxaban (XARELTO) 15 MG TABS tablet Take 1 tablet (15 mg total) by mouth daily with supper. 07/27/17   Charolette Forward, MD  spironolactone (ALDACTONE) 25 MG tablet Take 25 mg by mouth daily. 08/18/17   [provider]  vitamin B-12 (CYANOCOBALAMIN) 500 MCG tablet Take 500 mcg by mouth daily with lunch.    [provider]    Family History Family History  Problem Relation Age of Onset  . Diabetes Mother   . Heart disease Mother   . Diabetes Sister   . Heart disease Sister   . Colitis Sister   . Kidney disease Brother   .  Breast cancer Other        Neice  . Allergies Other        Nephrew  . Asthma Other        Nephrew    Social History Social History   Tobacco Use  . Smoking status: Never Smoker  . Smokeless tobacco: Never Used  Substance Use Topics  . Alcohol use: No  . Drug use: No     Allergies   Ace inhibitors; Prednisone; Sulfa antibiotics; Penicillins; Sulfamethoxazole; and Sulfonamide derivatives   Review of Systems Review of Systems  Constitutional: Negative for fever.  HENT: Negative for sore throat.   Eyes: Negative for visual disturbance.  Respiratory: Negative for cough and shortness of breath.   Cardiovascular: Negative for chest pain.  Gastrointestinal: Positive for diarrhea. Negative for abdominal pain, nausea and vomiting.  Genitourinary: Negative for difficulty urinating.  Musculoskeletal: Negative for back pain and neck pain.  Skin: Negative for rash.  Neurological: Positive for speech difficulty. Negative for dizziness, syncope, facial asymmetry, weakness, numbness and headaches.     Physical Exam Updated Vital Signs BP (!) 131/49   Pulse 69   Temp 98.4 F (36.9 C) (Oral)   Resp (!) 21   Ht 5\' 1"  (1.549 m)   Wt 81.6 kg   SpO2 100%   BMI 34.01 kg/m   Physical Exam  Constitutional: She is oriented to person, place, and time. She appears well-developed and well-nourished. No distress.  HENT:  Head: Normocephalic and atraumatic.  Eyes: Conjunctivae and EOM are normal.  Neck: Normal range of motion.  Cardiovascular: Normal rate, regular rhythm, normal heart sounds and intact distal pulses. Exam reveals no gallop and no friction rub.  No murmur heard. Pulmonary/Chest: Effort normal and breath sounds normal. No respiratory distress. She has no wheezes. She has no rales.  Abdominal: Soft. She exhibits no distension. There is no tenderness. There is no guarding.  Musculoskeletal: She exhibits no edema or tenderness.  Neurological: She is alert and oriented to  person, place, and time. She has normal strength. No cranial nerve deficit or sensory deficit. Coordination normal. GCS eye subscore is 4. GCS verbal subscore is 5. GCS motor subscore is 6.  Skin: Skin is warm and dry. No rash noted. She is not diaphoretic. No erythema.  Nursing note and vitals reviewed.    ED Treatments / Results  Labs (all labs ordered are listed, but only abnormal results are displayed) Labs Reviewed  CBC - Abnormal; Notable for the following components:      Result Value   RDW 15.6 (*)    All other components within normal limits  DIFFERENTIAL - Abnormal; Notable for the following components:   Lymphs Abs 4.3 (*)    All other components within normal limits  COMPREHENSIVE METABOLIC PANEL - Abnormal; Notable for the following components:   Sodium 128 (*)    Potassium 5.2 (*)    Chloride 97 (*)    CO2 21 (*)    Glucose, Bld 512 (*)    BUN 31 (*)    Creatinine, Ser 1.40 (*)    GFR calc non Af Amer 35 (*)    GFR calc Af Amer 40 (*)    All other components within normal limits  I-STAT CHEM 8, ED - Abnormal; Notable for the following components:   Sodium 128 (*)    Potassium 6.1 (*)    BUN 48 (*)    Creatinine, Ser 1.30 (*)    Glucose, Bld 530 (*)    All other components within normal limits  ETHANOL  PROTIME-INR  APTT  RAPID URINE DRUG SCREEN, HOSP PERFORMED  URINALYSIS, ROUTINE W REFLEX MICROSCOPIC  I-STAT TROPONIN, ED    EKG None  Radiology Ct Head Wo Contrast  Result Date: 12/31/2017 CLINICAL DATA:  Slurred speech EXAM: CT HEAD WITHOUT CONTRAST TECHNIQUE: Contiguous axial images were obtained from the base of the skull through the vertex without intravenous contrast. COMPARISON:  CT brain 09/16/2016, MRI 04/09/2016 FINDINGS: Brain: No acute territorial infarction, hemorrhage, or intracranial mass is visualized. Atrophy with moderate to marked small vessel ischemic changes of the white matter. Chronic lacunar infarcts within the basal ganglia and  cerebellum. Stable ventricle size. Vascular: No hyperdense vessels.  Carotid vascular calcification. Skull: Normal. Negative for fracture or focal lesion. Sinuses/Orbits: No acute finding. Other: None IMPRESSION: 1. No CT evidence for acute intracranial  abnormality. 2. Atrophy and small vessel ischemic changes of the white matter. Electronically Signed   By: Donavan Foil M.D.   On: 12/31/2017 18:35    Procedures Procedures (including critical care time)  Medications Ordered in ED Medications - No data to display   Initial Impression / Assessment and Plan / ED Course  I have reviewed the triage vital signs and the nursing notes.  Pertinent labs & imaging results that were available during my care of the patient were reviewed by me and considered in my medical decision making (see chart for details).     84 year old female with a history of hypertension, TIA, diabetes, presents with concern for episodes of difficulty speaking.  Differential diagnosis includes metabolic abnormality, TIA, or seizure.  No evidence of continue neurologic deficit on my examination.  Initially discussed with Dr. Cheral Marker of neurology, and in setting of patient returned to baseline with brief episodes of isolated dysarthria without other abnormalities, felt that if MRI was negative, she would not require TIA admission as isolated dysarthria is unlikely to represent stroke.  Family confirms episode of slurred speech and not aphasia.  Labs returned showing hyperglycemia, mild hyperkalemia and hyponatremia in setting of hyperglycemia.  Patient had another episode witnessed by PA-C in the ED that lasted a second and resolved.  Possible episodes related to ?HHS although glucose is less than 600 and given this I doubt this diagnosis. Discussed that I feel admission for continued work up is appropriate, however, given Neurology recommendations it is not unreasonable to follow up as an outpatient.  Patient prefers to go home and  given Neurology recommendations I do not feel this is unreasonable. Family agrees they will stay with her.  Initially discussed IV fluids however patient however her glucose has come down to 430, no signs of DKA, patient asymptomatic at this time.  Do not feel she is exhibiting signs of diabetic emergency.  Will give home levemir 20U, have patient monitor glucose closely, follow up with Neurology and discussed strict return precautions.     Final Clinical Impressions(s) / ED Diagnoses   Final diagnoses:  Dysarthria    ED Discharge Orders    None       Gareth Morgan, MD 01/01/18 0930

## 2017-12-31 NOTE — ED Triage Notes (Signed)
Pt was at the bank today when she had a period of slurred speech around 1615. Pt's daughter brought her to the ED and stated that she had another episode of slurred speech on the way here. No other symptoms or complaints reported.

## 2018-01-01 ENCOUNTER — Emergency Department (HOSPITAL_COMMUNITY): Payer: Medicare HMO

## 2018-01-01 ENCOUNTER — Inpatient Hospital Stay (HOSPITAL_COMMUNITY): Payer: Medicare HMO

## 2018-01-01 ENCOUNTER — Inpatient Hospital Stay (HOSPITAL_COMMUNITY)
Admission: EM | Admit: 2018-01-01 | Discharge: 2018-01-09 | DRG: 100 | Disposition: A | Discharge status: Rehabilitation Facility | Payer: Medicare HMO | Attending: Internal Medicine | Admitting: Internal Medicine

## 2018-01-01 DIAGNOSIS — D72829 Elevated white blood cell count, unspecified: Secondary | ICD-10-CM | POA: Diagnosis not present

## 2018-01-01 DIAGNOSIS — E1142 Type 2 diabetes mellitus with diabetic polyneuropathy: Secondary | ICD-10-CM

## 2018-01-01 DIAGNOSIS — Z882 Allergy status to sulfonamides status: Secondary | ICD-10-CM | POA: Diagnosis not present

## 2018-01-01 DIAGNOSIS — Z8249 Family history of ischemic heart disease and other diseases of the circulatory system: Secondary | ICD-10-CM | POA: Diagnosis not present

## 2018-01-01 DIAGNOSIS — I160 Hypertensive urgency: Secondary | ICD-10-CM | POA: Diagnosis present

## 2018-01-01 DIAGNOSIS — S199XXA Unspecified injury of neck, initial encounter: Secondary | ICD-10-CM | POA: Diagnosis not present

## 2018-01-01 DIAGNOSIS — I499 Cardiac arrhythmia, unspecified: Secondary | ICD-10-CM | POA: Diagnosis not present

## 2018-01-01 DIAGNOSIS — R0989 Other specified symptoms and signs involving the circulatory and respiratory systems: Secondary | ICD-10-CM | POA: Diagnosis not present

## 2018-01-01 DIAGNOSIS — I480 Paroxysmal atrial fibrillation: Secondary | ICD-10-CM

## 2018-01-01 DIAGNOSIS — E1010 Type 1 diabetes mellitus with ketoacidosis without coma: Secondary | ICD-10-CM | POA: Diagnosis not present

## 2018-01-01 DIAGNOSIS — E119 Type 2 diabetes mellitus without complications: Secondary | ICD-10-CM

## 2018-01-01 DIAGNOSIS — W01198A Fall on same level from slipping, tripping and stumbling with subsequent striking against other object, initial encounter: Secondary | ICD-10-CM | POA: Diagnosis present

## 2018-01-01 DIAGNOSIS — G40901 Epilepsy, unspecified, not intractable, with status epilepticus: Principal | ICD-10-CM | POA: Diagnosis present

## 2018-01-01 DIAGNOSIS — R0602 Shortness of breath: Secondary | ICD-10-CM | POA: Diagnosis not present

## 2018-01-01 DIAGNOSIS — K219 Gastro-esophageal reflux disease without esophagitis: Secondary | ICD-10-CM | POA: Diagnosis present

## 2018-01-01 DIAGNOSIS — J9811 Atelectasis: Secondary | ICD-10-CM | POA: Diagnosis not present

## 2018-01-01 DIAGNOSIS — I10 Essential (primary) hypertension: Secondary | ICD-10-CM | POA: Diagnosis present

## 2018-01-01 DIAGNOSIS — J9601 Acute respiratory failure with hypoxia: Secondary | ICD-10-CM | POA: Diagnosis not present

## 2018-01-01 DIAGNOSIS — Z9049 Acquired absence of other specified parts of digestive tract: Secondary | ICD-10-CM

## 2018-01-01 DIAGNOSIS — Z881 Allergy status to other antibiotic agents status: Secondary | ICD-10-CM | POA: Diagnosis not present

## 2018-01-01 DIAGNOSIS — S0990XA Unspecified injury of head, initial encounter: Secondary | ICD-10-CM | POA: Diagnosis not present

## 2018-01-01 DIAGNOSIS — N183 Chronic kidney disease, stage 3 (moderate): Secondary | ICD-10-CM | POA: Diagnosis not present

## 2018-01-01 DIAGNOSIS — Z8673 Personal history of transient ischemic attack (TIA), and cerebral infarction without residual deficits: Secondary | ICD-10-CM

## 2018-01-01 DIAGNOSIS — Z6837 Body mass index (BMI) 37.0-37.9, adult: Secondary | ICD-10-CM

## 2018-01-01 DIAGNOSIS — Z880 Allergy status to penicillin: Secondary | ICD-10-CM | POA: Diagnosis not present

## 2018-01-01 DIAGNOSIS — Z833 Family history of diabetes mellitus: Secondary | ICD-10-CM | POA: Diagnosis not present

## 2018-01-01 DIAGNOSIS — Z9081 Acquired absence of spleen: Secondary | ICD-10-CM | POA: Diagnosis not present

## 2018-01-01 DIAGNOSIS — Z888 Allergy status to other drugs, medicaments and biological substances status: Secondary | ICD-10-CM | POA: Diagnosis not present

## 2018-01-01 DIAGNOSIS — H9193 Unspecified hearing loss, bilateral: Secondary | ICD-10-CM | POA: Diagnosis not present

## 2018-01-01 DIAGNOSIS — Z7901 Long term (current) use of anticoagulants: Secondary | ICD-10-CM

## 2018-01-01 DIAGNOSIS — R001 Bradycardia, unspecified: Secondary | ICD-10-CM | POA: Diagnosis not present

## 2018-01-01 DIAGNOSIS — J9690 Respiratory failure, unspecified, unspecified whether with hypoxia or hypercapnia: Secondary | ICD-10-CM | POA: Diagnosis not present

## 2018-01-01 DIAGNOSIS — R7309 Other abnormal glucose: Secondary | ICD-10-CM | POA: Diagnosis not present

## 2018-01-01 DIAGNOSIS — N179 Acute kidney failure, unspecified: Secondary | ICD-10-CM | POA: Diagnosis not present

## 2018-01-01 DIAGNOSIS — Z79899 Other long term (current) drug therapy: Secondary | ICD-10-CM

## 2018-01-01 DIAGNOSIS — R4781 Slurred speech: Secondary | ICD-10-CM | POA: Diagnosis not present

## 2018-01-01 DIAGNOSIS — J9600 Acute respiratory failure, unspecified whether with hypoxia or hypercapnia: Secondary | ICD-10-CM

## 2018-01-01 DIAGNOSIS — G9340 Encephalopathy, unspecified: Secondary | ICD-10-CM | POA: Diagnosis not present

## 2018-01-01 DIAGNOSIS — Z794 Long term (current) use of insulin: Secondary | ICD-10-CM

## 2018-01-01 DIAGNOSIS — E46 Unspecified protein-calorie malnutrition: Secondary | ICD-10-CM | POA: Diagnosis not present

## 2018-01-01 DIAGNOSIS — G9349 Other encephalopathy: Secondary | ICD-10-CM | POA: Diagnosis present

## 2018-01-01 DIAGNOSIS — E1140 Type 2 diabetes mellitus with diabetic neuropathy, unspecified: Secondary | ICD-10-CM | POA: Diagnosis not present

## 2018-01-01 DIAGNOSIS — R471 Dysarthria and anarthria: Secondary | ICD-10-CM | POA: Diagnosis not present

## 2018-01-01 DIAGNOSIS — I4891 Unspecified atrial fibrillation: Secondary | ICD-10-CM | POA: Diagnosis not present

## 2018-01-01 DIAGNOSIS — Z01818 Encounter for other preprocedural examination: Secondary | ICD-10-CM

## 2018-01-01 DIAGNOSIS — R404 Transient alteration of awareness: Secondary | ICD-10-CM | POA: Diagnosis not present

## 2018-01-01 DIAGNOSIS — K5900 Constipation, unspecified: Secondary | ICD-10-CM | POA: Diagnosis not present

## 2018-01-01 DIAGNOSIS — R569 Unspecified convulsions: Secondary | ICD-10-CM | POA: Diagnosis not present

## 2018-01-01 DIAGNOSIS — R195 Other fecal abnormalities: Secondary | ICD-10-CM | POA: Diagnosis not present

## 2018-01-01 DIAGNOSIS — E162 Hypoglycemia, unspecified: Secondary | ICD-10-CM | POA: Diagnosis not present

## 2018-01-01 DIAGNOSIS — M50221 Other cervical disc displacement at C4-C5 level: Secondary | ICD-10-CM | POA: Diagnosis not present

## 2018-01-01 DIAGNOSIS — E669 Obesity, unspecified: Secondary | ICD-10-CM | POA: Diagnosis not present

## 2018-01-01 DIAGNOSIS — Z4682 Encounter for fitting and adjustment of non-vascular catheter: Secondary | ICD-10-CM | POA: Diagnosis not present

## 2018-01-01 DIAGNOSIS — D62 Acute posthemorrhagic anemia: Secondary | ICD-10-CM | POA: Diagnosis not present

## 2018-01-01 DIAGNOSIS — E1165 Type 2 diabetes mellitus with hyperglycemia: Secondary | ICD-10-CM | POA: Diagnosis not present

## 2018-01-01 DIAGNOSIS — R4182 Altered mental status, unspecified: Secondary | ICD-10-CM | POA: Diagnosis not present

## 2018-01-01 DIAGNOSIS — R5381 Other malaise: Secondary | ICD-10-CM | POA: Diagnosis not present

## 2018-01-01 DIAGNOSIS — Z86718 Personal history of other venous thrombosis and embolism: Secondary | ICD-10-CM

## 2018-01-01 DIAGNOSIS — R05 Cough: Secondary | ICD-10-CM | POA: Diagnosis not present

## 2018-01-01 DIAGNOSIS — G40909 Epilepsy, unspecified, not intractable, without status epilepticus: Secondary | ICD-10-CM | POA: Diagnosis not present

## 2018-01-01 DIAGNOSIS — Z4659 Encounter for fitting and adjustment of other gastrointestinal appliance and device: Secondary | ICD-10-CM

## 2018-01-01 HISTORY — DX: Unspecified atrial fibrillation: I48.91

## 2018-01-01 LAB — BASIC METABOLIC PANEL
ANION GAP: 10 (ref 5–15)
Anion gap: 15 (ref 5–15)
Anion gap: 13 (ref 5–15)
Anion gap: 10 (ref 5–15)
BUN: 27 mg/dL — AB (ref 8–23)
BUN: 25 mg/dL — ABNORMAL HIGH (ref 8–23)
BUN: 25 mg/dL — ABNORMAL HIGH (ref 8–23)
BUN: 21 mg/dL (ref 8–23)
CO2: 19 mmol/L — ABNORMAL LOW (ref 22–32)
CO2: 19 mmol/L — ABNORMAL LOW (ref 22–32)
CO2: 20 mmol/L — AB (ref 22–32)
CO2: 21 mmol/L — ABNORMAL LOW (ref 22–32)
Calcium: 9.5 mg/dL (ref 8.9–10.3)
Calcium: 9.3 mg/dL (ref 8.9–10.3)
Calcium: 9.2 mg/dL (ref 8.9–10.3)
Calcium: 8.9 mg/dL (ref 8.9–10.3)
Chloride: 103 mmol/L (ref 98–111)
Chloride: 104 mmol/L (ref 98–111)
Chloride: 106 mmol/L (ref 98–111)
Chloride: 104 mmol/L (ref 98–111)
Creatinine, Ser: 1.49 mg/dL — ABNORMAL HIGH (ref 0.44–1.00)
Creatinine, Ser: 1.25 mg/dL — ABNORMAL HIGH (ref 0.44–1.00)
Creatinine, Ser: 1.34 mg/dL — ABNORMAL HIGH (ref 0.44–1.00)
Creatinine, Ser: 1.23 mg/dL — ABNORMAL HIGH (ref 0.44–1.00)
GFR calc Af Amer: 37 mL/min — ABNORMAL LOW (ref >60)
GFR calc Af Amer: 46 mL/min — ABNORMAL LOW (ref >60)
GFR calc Af Amer: 42 mL/min — ABNORMAL LOW (ref >60)
GFR calc Af Amer: 47 mL/min — ABNORMAL LOW (ref >60)
GFR calc non Af Amer: 32 mL/min — ABNORMAL LOW (ref >60)
GFR calc non Af Amer: 40 mL/min — ABNORMAL LOW (ref >60)
GFR calc non Af Amer: 37 mL/min — ABNORMAL LOW (ref >60)
GFR calc non Af Amer: 41 mL/min — ABNORMAL LOW (ref >60)
GLUCOSE: 205 mg/dL — AB (ref 70–99)
GLUCOSE: 191 mg/dL — AB (ref 70–99)
Glucose, Bld: 303 mg/dL — ABNORMAL HIGH (ref 70–99)
Glucose, Bld: 206 mg/dL — ABNORMAL HIGH (ref 70–99)
Potassium: 4.5 mmol/L (ref 3.5–5.1)
Potassium: 5 mmol/L (ref 3.5–5.1)
Potassium: 4.6 mmol/L (ref 3.5–5.1)
Potassium: 3.9 mmol/L (ref 3.5–5.1)
SODIUM: 137 mmol/L (ref 135–145)
Sodium: 136 mmol/L (ref 135–145)
Sodium: 136 mmol/L (ref 135–145)
Sodium: 135 mmol/L (ref 135–145)

## 2018-01-01 LAB — HEMOGLOBIN A1C
Hgb A1c MFr Bld: 13 % — ABNORMAL HIGH (ref 4.8–5.6)
Mean Plasma Glucose: 326.4 mg/dL

## 2018-01-01 LAB — COMPREHENSIVE METABOLIC PANEL
ALT: 17 U/L (ref 0–44)
ANION GAP: 24 — AB (ref 5–15)
AST: 25 U/L (ref 15–41)
Albumin: 3.8 g/dL (ref 3.5–5.0)
Alkaline Phosphatase: 92 U/L (ref 38–126)
BUN: 31 mg/dL — ABNORMAL HIGH (ref 8–23)
CHLORIDE: 98 mmol/L (ref 98–111)
CO2: 11 mmol/L — ABNORMAL LOW (ref 22–32)
Calcium: 9.8 mg/dL (ref 8.9–10.3)
Creatinine, Ser: 1.95 mg/dL — ABNORMAL HIGH (ref 0.44–1.00)
GFR calc Af Amer: 27 mL/min — ABNORMAL LOW (ref >60)
GFR calc non Af Amer: 23 mL/min — ABNORMAL LOW (ref >60)
Glucose, Bld: 578 mg/dL (ref 70–99)
Potassium: 4.1 mmol/L (ref 3.5–5.1)
Sodium: 133 mmol/L — ABNORMAL LOW (ref 135–145)
Total Bilirubin: 0.5 mg/dL (ref 0.3–1.2)
Total Protein: 7.9 g/dL (ref 6.5–8.1)

## 2018-01-01 LAB — I-STAT CHEM 8, ED
BUN: 32 mg/dL — ABNORMAL HIGH (ref 8–23)
Calcium, Ion: 1.19 mmol/L (ref 1.15–1.40)
Chloride: 103 mmol/L (ref 98–111)
Creatinine, Ser: 1.5 mg/dL — ABNORMAL HIGH (ref 0.44–1.00)
Glucose, Bld: 596 mg/dL (ref 70–99)
HCT: 44 % (ref 36.0–46.0)
Hemoglobin: 15 g/dL (ref 12.0–15.0)
Potassium: 4.1 mmol/L (ref 3.5–5.1)
Sodium: 132 mmol/L — ABNORMAL LOW (ref 135–145)
TCO2: 14 mmol/L — ABNORMAL LOW (ref 22–32)

## 2018-01-01 LAB — GLUCOSE, CAPILLARY
GLUCOSE-CAPILLARY: 186 mg/dL — AB (ref 70–99)
GLUCOSE-CAPILLARY: 187 mg/dL — AB (ref 70–99)
Glucose-Capillary: 113 mg/dL — ABNORMAL HIGH (ref 70–99)
Glucose-Capillary: 141 mg/dL — ABNORMAL HIGH (ref 70–99)
Glucose-Capillary: 156 mg/dL — ABNORMAL HIGH (ref 70–99)
Glucose-Capillary: 160 mg/dL — ABNORMAL HIGH (ref 70–99)
Glucose-Capillary: 160 mg/dL — ABNORMAL HIGH (ref 70–99)
Glucose-Capillary: 166 mg/dL — ABNORMAL HIGH (ref 70–99)
Glucose-Capillary: 176 mg/dL — ABNORMAL HIGH (ref 70–99)
Glucose-Capillary: 179 mg/dL — ABNORMAL HIGH (ref 70–99)
Glucose-Capillary: 187 mg/dL — ABNORMAL HIGH (ref 70–99)
Glucose-Capillary: 188 mg/dL — ABNORMAL HIGH (ref 70–99)
Glucose-Capillary: 334 mg/dL — ABNORMAL HIGH (ref 70–99)

## 2018-01-01 LAB — CBC WITH DIFFERENTIAL/PLATELET
Abs Immature Granulocytes: 0.09 10*3/uL — ABNORMAL HIGH (ref 0.00–0.07)
Basophils Absolute: 0.1 10*3/uL (ref 0.0–0.1)
Basophils Relative: 1 %
EOS ABS: 0.2 10*3/uL (ref 0.0–0.5)
Eosinophils Relative: 1 %
HCT: 42.2 % (ref 36.0–46.0)
Hemoglobin: 12.8 g/dL (ref 12.0–15.0)
Immature Granulocytes: 1 %
Lymphocytes Relative: 43 %
Lymphs Abs: 6.6 10*3/uL — ABNORMAL HIGH (ref 0.7–4.0)
MCH: 26.4 pg (ref 26.0–34.0)
MCHC: 30.3 g/dL (ref 30.0–36.0)
MCV: 87 fL (ref 80.0–100.0)
Monocytes Absolute: 1.4 10*3/uL — ABNORMAL HIGH (ref 0.1–1.0)
Monocytes Relative: 10 %
NEUTROS PCT: 44 %
Neutro Abs: 6.4 10*3/uL (ref 1.7–7.7)
PLATELETS: 386 10*3/uL (ref 150–400)
RBC: 4.85 MIL/uL (ref 3.87–5.11)
RDW: 15.8 % — ABNORMAL HIGH (ref 11.5–15.5)
WBC: 14.7 10*3/uL — ABNORMAL HIGH (ref 4.0–10.5)
nRBC: 0 % (ref 0.0–0.2)

## 2018-01-01 LAB — CBC
HCT: 40.9 % (ref 36.0–46.0)
Hemoglobin: 13 g/dL (ref 12.0–15.0)
MCH: 26.4 pg (ref 26.0–34.0)
MCHC: 31.8 g/dL (ref 30.0–36.0)
MCV: 83.1 fL (ref 80.0–100.0)
Platelets: 405 10*3/uL — ABNORMAL HIGH (ref 150–400)
RBC: 4.92 MIL/uL (ref 3.87–5.11)
RDW: 15.5 % (ref 11.5–15.5)
WBC: 14.8 10*3/uL — ABNORMAL HIGH (ref 4.0–10.5)
nRBC: 0 % (ref 0.0–0.2)

## 2018-01-01 LAB — CBG MONITORING, ED
Glucose-Capillary: 527 mg/dL (ref 70–99)
Glucose-Capillary: 468 mg/dL — ABNORMAL HIGH (ref 70–99)

## 2018-01-01 LAB — I-STAT TROPONIN, ED: Troponin i, poc: 0.02 ng/mL (ref 0.00–0.08)

## 2018-01-01 LAB — I-STAT ARTERIAL BLOOD GAS, ED
Acid-base deficit: 4 mmol/L — ABNORMAL HIGH (ref 0.0–2.0)
Bicarbonate: 22 mmol/L (ref 20.0–28.0)
O2 Saturation: 99 %
TCO2: 23 mmol/L (ref 22–32)
pCO2 arterial: 43.1 mmHg (ref 32.0–48.0)
pH, Arterial: 7.317 — ABNORMAL LOW (ref 7.350–7.450)
pO2, Arterial: 146 mmHg — ABNORMAL HIGH (ref 83.0–108.0)

## 2018-01-01 LAB — LACTIC ACID, PLASMA: Lactic Acid, Venous: 2.8 mmol/L (ref 0.5–1.9)

## 2018-01-01 LAB — PROCALCITONIN: Procalcitonin: 0.28 ng/mL

## 2018-01-01 LAB — CK: Total CK: 59 U/L (ref 38–234)

## 2018-01-01 LAB — I-STAT CG4 LACTIC ACID, ED: Lactic Acid, Venous: 13.28 mmol/L (ref 0.5–1.9)

## 2018-01-01 LAB — STREP PNEUMONIAE URINARY ANTIGEN: Strep Pneumo Urinary Antigen: NEGATIVE

## 2018-01-01 LAB — TRIGLYCERIDES: Triglycerides: 171 mg/dL — ABNORMAL HIGH (ref <150)

## 2018-01-01 LAB — MRSA PCR SCREENING: MRSA by PCR: NEGATIVE

## 2018-01-01 LAB — MAGNESIUM: Magnesium: 2.2 mg/dL (ref 1.7–2.4)

## 2018-01-01 LAB — PHOSPHORUS: Phosphorus: 4.3 mg/dL (ref 2.5–4.6)

## 2018-01-01 MED ORDER — SODIUM CHLORIDE 0.9 % IV BOLUS
1000.0000 mL | Freq: Once | INTRAVENOUS | Status: AC
  Administered 2018-01-01: 1000 mL via INTRAVENOUS

## 2018-01-01 MED ORDER — SODIUM CHLORIDE 0.9 % IV SOLN
4000.0000 mg | Freq: Once | INTRAVENOUS | Status: AC
  Administered 2018-01-01: 4000 mg via INTRAVENOUS
  Filled 2018-01-01: qty 40

## 2018-01-01 MED ORDER — MIDAZOLAM HCL 2 MG/2ML IJ SOLN
1.0000 mg | INTRAMUSCULAR | Status: DC | PRN
  Administered 2018-01-03 – 2018-01-04 (×4): 1 mg via INTRAVENOUS
  Filled 2018-01-01 (×3): qty 2

## 2018-01-01 MED ORDER — POTASSIUM CHLORIDE 10 MEQ/100ML IV SOLN: 10.0000 meq | INTRAVENOUS | Status: AC

## 2018-01-01 MED ORDER — HEPARIN SODIUM (PORCINE) 5000 UNIT/ML IJ SOLN
5000.0000 [IU] | Freq: Three times a day (TID) | INTRAMUSCULAR | Status: DC
  Administered 2018-01-01 – 2018-01-05 (×13): 5000 [IU] via SUBCUTANEOUS
  Filled 2018-01-01 (×13): qty 1

## 2018-01-01 MED ORDER — INSULIN DETEMIR 100 UNIT/ML ~~LOC~~ SOLN
15.0000 [IU] | Freq: Two times a day (BID) | SUBCUTANEOUS | Status: DC
  Administered 2018-01-01: 15 [IU] via SUBCUTANEOUS
  Filled 2018-01-01 (×2): qty 0.15

## 2018-01-01 MED ORDER — LORAZEPAM 2 MG/ML IJ SOLN
INTRAMUSCULAR | Status: AC
  Filled 2018-01-01: qty 1

## 2018-01-01 MED ORDER — FENTANYL CITRATE (PF) 100 MCG/2ML IJ SOLN
50.0000 ug | INTRAMUSCULAR | Status: DC | PRN
  Filled 2018-01-01 (×5): qty 2

## 2018-01-01 MED ORDER — CHLORHEXIDINE GLUCONATE 0.12% ORAL RINSE (MEDLINE KIT)
15.0000 mL | Freq: Two times a day (BID) | OROMUCOSAL | Status: DC
  Administered 2018-01-01 – 2018-01-05 (×9): 15 mL via OROMUCOSAL

## 2018-01-01 MED ORDER — PROPOFOL 1000 MG/100ML IV EMUL
0.0000 ug/kg/min | INTRAVENOUS | Status: DC
  Administered 2018-01-01: 10 ug/kg/min via INTRAVENOUS
  Administered 2018-01-02: 15 ug/kg/min via INTRAVENOUS
  Administered 2018-01-02: 25 ug/kg/min via INTRAVENOUS
  Administered 2018-01-02: 20 ug/kg/min via INTRAVENOUS
  Administered 2018-01-02: 10 ug/kg/min via INTRAVENOUS
  Administered 2018-01-03: 20 ug/kg/min via INTRAVENOUS
  Administered 2018-01-03: 15 ug/kg/min via INTRAVENOUS
  Filled 2018-01-01 (×8): qty 100

## 2018-01-01 MED ORDER — PROPOFOL 1000 MG/100ML IV EMUL
INTRAVENOUS | Status: AC
  Filled 2018-01-01: qty 100

## 2018-01-01 MED ORDER — PANTOPRAZOLE SODIUM 40 MG IV SOLR
40.0000 mg | Freq: Every day | INTRAVENOUS | Status: DC
  Administered 2018-01-01 – 2018-01-05 (×5): 40 mg via INTRAVENOUS
  Filled 2018-01-01 (×5): qty 40

## 2018-01-01 MED ORDER — PROPOFOL 1000 MG/100ML IV EMUL
0.0000 ug/kg/min | INTRAVENOUS | Status: DC
  Administered 2018-01-01: 10 ug/kg/min via INTRAVENOUS

## 2018-01-01 MED ORDER — ETOMIDATE 2 MG/ML IV SOLN
20.0000 mg | Freq: Once | INTRAVENOUS | Status: AC
  Administered 2018-01-01: 20 mg via INTRAVENOUS

## 2018-01-01 MED ORDER — LEVETIRACETAM IN NACL 1000 MG/100ML IV SOLN: 1000.0000 mg | Freq: Once | INTRAVENOUS | Status: DC

## 2018-01-01 MED ORDER — LABETALOL HCL 5 MG/ML IV SOLN
20.0000 mg | INTRAVENOUS | Status: DC | PRN
  Administered 2018-01-01 – 2018-01-04 (×10): 20 mg via INTRAVENOUS
  Filled 2018-01-01 (×10): qty 4

## 2018-01-01 MED ORDER — LACTATED RINGERS IV SOLN: INTRAVENOUS | Status: AC

## 2018-01-01 MED ORDER — ORAL CARE MOUTH RINSE
15.0000 mL | OROMUCOSAL | Status: DC
  Administered 2018-01-01 – 2018-01-05 (×38): 15 mL via OROMUCOSAL

## 2018-01-01 MED ORDER — LORAZEPAM 2 MG/ML IJ SOLN
4.0000 mg | Freq: Once | INTRAMUSCULAR | Status: AC
  Administered 2018-01-01: 4 mg via INTRAVENOUS
  Filled 2018-01-01: qty 2

## 2018-01-01 MED ORDER — INSULIN REGULAR(HUMAN) IN NACL 100-0.9 UT/100ML-% IV SOLN
INTRAVENOUS | Status: DC
  Administered 2018-01-01: 4.1 [IU]/h via INTRAVENOUS
  Filled 2018-01-01: qty 100

## 2018-01-01 MED ORDER — ROCURONIUM BROMIDE 50 MG/5ML IV SOLN
75.0000 mg | Freq: Once | INTRAVENOUS | Status: AC
  Administered 2018-01-01: 75 mg via INTRAVENOUS
  Filled 2018-01-01: qty 7.5

## 2018-01-01 MED ORDER — LACTATED RINGERS IV BOLUS: 1000.0000 mL | Freq: Once | INTRAVENOUS | Status: DC

## 2018-01-01 MED ORDER — DEXTROSE-NACL 5-0.45 % IV SOLN
INTRAVENOUS | Status: DC
  Administered 2018-01-01 – 2018-01-02 (×4): via INTRAVENOUS

## 2018-01-01 MED ORDER — ACETAMINOPHEN 325 MG PO TABS
650.0000 mg | ORAL_TABLET | Freq: Four times a day (QID) | ORAL | Status: DC | PRN
  Administered 2018-01-01 – 2018-01-04 (×2): 650 mg
  Filled 2018-01-01 (×3): qty 2

## 2018-01-01 MED ORDER — SODIUM CHLORIDE 0.9 % IV SOLN
INTRAVENOUS | Status: DC
  Administered 2018-01-01 – 2018-01-04 (×4): via INTRAVENOUS

## 2018-01-01 MED ORDER — FENTANYL CITRATE (PF) 100 MCG/2ML IJ SOLN
50.0000 ug | INTRAMUSCULAR | Status: DC | PRN
  Administered 2018-01-02 – 2018-01-05 (×14): 50 ug via INTRAVENOUS
  Filled 2018-01-01 (×11): qty 2

## 2018-01-01 MED ORDER — LEVETIRACETAM IN NACL 500 MG/100ML IV SOLN
500.0000 mg | Freq: Two times a day (BID) | INTRAVENOUS | Status: DC
  Administered 2018-01-02 – 2018-01-03 (×3): 500 mg via INTRAVENOUS
  Filled 2018-01-01 (×3): qty 100

## 2018-01-01 MED ORDER — MIDAZOLAM HCL 2 MG/2ML IJ SOLN
1.0000 mg | INTRAMUSCULAR | Status: DC | PRN
  Filled 2018-01-01: qty 2

## 2018-01-01 NOTE — Progress Notes (Signed)
Per DKA protocol, pt able to be transitioned off insulin drip. 15 units of levemir was given 2 hours prior to insulin drip being turned off per protocol. Pt placed on Q4 sliding scale insulin per protocol. Will continue to monitor. Clint Bolder, RN 01/02/18 1:30 AM

## 2018-01-01 NOTE — Progress Notes (Signed)
CRITICAL VALUE ALERT  Critical Value:  Lactic Acid 2.8  Date & Time Notied:  01/01/2018 1105  Provider Notified: Richardson Landry Minor NP  Orders Received/Actions taken: No new orders  Irven Baltimore, RN

## 2018-01-01 NOTE — Progress Notes (Signed)
Inpatient Diabetes Program Recommendations  AACE/ADA: New Consensus Statement on Inpatient Glycemic Control (2015)  Target Ranges:  Prepandial:   less than 140 mg/dL      Peak postprandial:   less than 180 mg/dL (1-2 hours)      Critically ill patients:  140 - 180 mg/dL   Lab Results  Component Value Date   GLUCAP 166 (H) 01/01/2018   HGBA1C 13.0 (H) 01/01/2018    Review of Glycemic ControlResults for [name], [name] (MRN [id number]) as of 01/01/2018 12:58  Ref. Range 01/01/2018 05:12 01/01/2018 07:27 01/01/2018 09:12 01/01/2018 10:43 01/01/2018 11:52  Glucose-Capillary Latest Ref Range: 70 - 99 mg/dL 527 (HH) 468 (H) 334 (H) 187 (H) 166 (H)    Diabetes history: DM 2 Outpatient Diabetes medications:  Metformin 500 mg daily, Levemir 25 units in the AM and 18 units in the PM Current orders for Inpatient glycemic control:  IV insulin/DKA order set Inpatient Diabetes Program Recommendations:   Reviewed chart. A1C is 13% indicating average blood sugars 326.4.  A1C has trended up from 10.6 in July of 2019 to 13%.  Will follow.  Once acidosis cleared, consider adding Levemir 15 units bid (will need basal 2 hours prior to d/c of insulin drip).  Will follow.   Thanks,  Adah Perl, RN, BC-ADM Inpatient Diabetes Coordinator Pager [phone] (8a-5p)

## 2018-01-01 NOTE — H&P (Addendum)
NAME:  [name], MRN:  [id number], DOB:  1932-07-23, LOS: 0 ADMISSION DATE:  01/01/2018, CONSULTATION DATE:  12/12 REFERRING MD:  Dr. Kathrynn Humble, CHIEF COMPLAINT:  Seizure   Brief History   84 year old female with past medical history of diabetes and hypertension admitted 12/12 for status epilepticus requiring intubation.  History of present illness   84 year old female with past medical history as below, which is significant for diabetes, hypertension, stroke, and small bowel obstruction.  Symptoms started 12/11 when she developed slurred speech while at the bank.  She had several episodes of this causing her to present to the emergency department in the evening hours of 12/11.  Work-up including CT scan and MRI were negative and she was ultimately discharged home.  Then in the early morning hours of 12/12 she was found to be unresponsive by her grandson.  EMS was called and in route to the emergency department she experienced seizure-like activity.  She had another witnessed seizure in the emergency department and was intubated for airway protection.  Repeat CT of the head was nonacute.  Neurology was consulted in the emergency department and has made recommendations for antiepileptic medications and started propofol infusion.  Past Medical History   has a past medical history of ANEMIA, IRON DEFICIENCY (08/11/2008), ANGIOEDEMA (03/01/2008), CEREBROVASCULAR ACCIDENT, HX OF (03/01/2008), CONTACT DERMATITIS&OTHER ECZEMA DUE UNSPEC CAUSE (11/28/2008), DIABETES MELLITUS (03/01/2008), Dysuria (01/04/2010), GERD (03/01/2008), HYPERSOMNIA (05/30/2009), HYPERTENSION (03/01/2008), INTERNAL HEMORRHOIDS (03/01/2008), KNEE PAIN, RIGHT (09/05/2009), NONSPECIFIC ABN FINDNG RAD&OTH EXAM BILARY TRCT (03/07/2008), OBESITY (01/10/2010), PERIPHERAL EDEMA (08/10/2008), POLYP, GALLBLADDER (03/07/2008), SMALL BOWEL OBSTRUCTION, HX OF (03/01/2008), SNORING (05/11/2009), Stroke ([place]) (04/08/2016), and THROMBOCYTOSIS  (08/18/2008).   Significant Hospital Events   12/11 to ED for slurred speech/TIA. Discharged 12/12 to ED found down. Seizures. Intubated   Consults:  Neurology  Procedures:  ETT 12/12  Significant Diagnostic Tests:  CT head 12/11 > No CT evidence for acute intracranial abnormality. Atrophy and small vessel ischemic changes of the white matter. MRI brain 12/11 > No acute intracranial process. Multiple old cerebellar infarcts. Moderate to severe chronic small vessel ischemic changes. CT head Cspine 12/12 > No acute intracranial hemorrhage. No acute/traumatic cervical spine pathology.  Micro Data:    Antimicrobials:    Interim history/subjective:    Objective   Blood pressure (!) 192/91, pulse (!) 124, resp. rate (!) 21, SpO2 100 %.    Vent Mode: PRVC FiO2 (%):  [40 %] 40 % Set Rate:  [18 bmp] 18 bmp Vt Set:  [360 mL-380 mL] 380 mL PEEP:  [5 cmH20] 5 cmH20 Plateau Pressure:  [14 cmH20] 14 cmH20   Intake/Output Summary (Last 24 hours) at 01/01/2018 0657 Last data filed at 01/01/2018 0645 Gross per 24 hour  Intake 1000 ml  Output -  Net 1000 ml   There were no vitals filed for this visit.  Examination: General: morbidly obese female on vent HENT: [place]/AT, PERRL, no JVD Lungs: Clear bilateral Cardiovascular: Tachy, regular, 3/6 murmur  Abdomen: Soft, non-distended Extremities: No acute deformity Neuro: Sedated/comatose  Resolved Hospital Problem list     Assessment & Plan:   Status epilepticus: No seizure history. She is hypertensive, which raises concern for PRES. She is also hyperglycemic with glucose nearing 600. Both of which could potentially cause seizure.  - AEDs per neurology - Propofol infusion for RASS -1 and cessation of seizures - EEG pending - Admit to ICU  Inability to protect airway in the setting of above - full vent support - ABG -  Intermittent CXR - VAP bundle  Hyperglycemia: with history of DM - IVF resuscitation - Insulin infusion  per protocol - Q4 hour AccuCheck  Hypertensive urgency: possibility of PRES. Not described on imaging, including MRI from 12/11. - Telemetry monitoring - PRN labetalol to keep SBP 160-180 mmHg - May need infusion   Metabolic acidosis with elevated anion gap: explained by lactic acid 13.  - ABG pending - Trend lactic acid  Best practice:  Diet: NPO Pain/Anxiety/Delirium protocol (if indicated): propofol for RASS -1 and seizure cessation VAP protocol (if indicated): Yes DVT prophylaxis: sq heparin GI prophylaxis: protonix Glucose control: SSI and IVF Mobility: BR Code Status: FULL Family Communication: Daughter (who is a Marine scientist) Disposition: ICU, critically ill.   Labs   CBC: Recent Labs  Lab 12/31/17 1724 12/31/17 1813 01/01/18 0515 01/01/18 0520  WBC 9.2  --  14.7*  --   NEUTROABS 3.6  --  6.4  --   HGB 12.4 14.3 12.8 15.0  HCT 39.3 42.0 42.2 44.0  MCV 83.6  --  87.0  --   PLT 359  --  386  --     Basic Metabolic Panel: Recent Labs  Lab 12/31/17 1724 12/31/17 1813 01/01/18 0515 01/01/18 0517 01/01/18 0520  NA 128* 128* 133*  --  132*  K 5.2* 6.1* 4.1  --  4.1  CL 97* 99 98  --  103  CO2 21*  --  11*  --   --   GLUCOSE 512* 530* 578*  --  596*  BUN 31* 48* 31*  --  32*  CREATININE 1.40* 1.30* 1.95*  --  1.50*  CALCIUM 9.6  --  9.8  --   --   MG  --   --   --  2.2  --   PHOS  --   --   --  4.3  --    GFR: Estimated Creatinine Clearance: 27.5 mL/min (A) (by C-G formula based on SCr of 1.5 mg/dL (H)). Recent Labs  Lab 12/31/17 1724 01/01/18 0515 01/01/18 0520  WBC 9.2 14.7*  --   LATICACIDVEN  --   --  13.28*    Liver Function Tests: Recent Labs  Lab 12/31/17 1724 01/01/18 0515  AST 16 25  ALT 17 17  ALKPHOS 94 92  BILITOT 0.4 0.5  PROT 7.7 7.9  ALBUMIN 3.5 3.8   No results for input(s): LIPASE, AMYLASE in the last 168 hours. No results for input(s): AMMONIA in the last 168 hours.  ABG    Component Value Date/Time   HCO3 24.8 (H)  03/10/2007 0857   TCO2 14 (L) 01/01/2018 0520   O2SAT 41.6 03/16/2008 2135     Coagulation Profile: Recent Labs  Lab 12/31/17 1724  INR 1.18    Cardiac Enzymes: Recent Labs  Lab 01/01/18 0515  CKTOTAL 59    HbA1C: Hgb A1c MFr Bld  Date/Time Value Ref Range Status  07/26/2017 12:03 AM 10.6 (H) 4.8 - 5.6 % Final    Comment:    (NOTE) Pre diabetes:          5.7%-6.4% Diabetes:              >6.4% Glycemic control for   <7.0% adults with diabetes   04/08/2016 06:11 AM 10.8 (H) 4.8 - 5.6 % Final    Comment:    (NOTE)         Pre-diabetes: 5.7 - 6.4         Diabetes: >6.4  Glycemic control for adults with diabetes: <7.0     CBG: Recent Labs  Lab 12/31/17 2122 01/01/18 0512  GLUCAP 436* 527*    Review of Systems:   unable  Past Medical History  She,  has a past medical history of ANEMIA, IRON DEFICIENCY (08/11/2008), ANGIOEDEMA (03/01/2008), CEREBROVASCULAR ACCIDENT, HX OF (03/01/2008), CONTACT DERMATITIS&OTHER ECZEMA DUE UNSPEC CAUSE (11/28/2008), DIABETES MELLITUS (03/01/2008), Dysuria (01/04/2010), GERD (03/01/2008), HYPERSOMNIA (05/30/2009), HYPERTENSION (03/01/2008), INTERNAL HEMORRHOIDS (03/01/2008), KNEE PAIN, RIGHT (09/05/2009), NONSPECIFIC ABN FINDNG RAD&OTH EXAM BILARY TRCT (03/07/2008), OBESITY (01/10/2010), PERIPHERAL EDEMA (08/10/2008), POLYP, GALLBLADDER (03/07/2008), SMALL BOWEL OBSTRUCTION, HX OF (03/01/2008), SNORING (05/11/2009), Stroke ([place]) (04/08/2016), and THROMBOCYTOSIS (08/18/2008).   Surgical History    Past Surgical History:  Procedure Laterality Date  . HERNIA REPAIR  09/14/08   with release [facility]-Dr. Dalbert Batman  . SMALL INTESTINE SURGERY    . 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 She has never used smokeless tobacco. She reports that she does not drink alcohol or use drugs.   Family History   Her family history includes Allergies in an other family  member; Asthma in an other family member; Breast cancer in an other family member; Colitis in her sister; Diabetes in her mother and sister; Heart disease in her mother and sister; Kidney disease in her brother.   Allergies Allergies  Allergen Reactions  . Ace Inhibitors Swelling    Angioedema   . Prednisone Nausea And Vomiting, Other (See Comments) and Hypertension    Blood sugar increased (also)  . Sulfa Antibiotics Hives  . Penicillins Rash    Has patient had a PCN reaction causing immediate rash, facial/tongue/throat swelling, SOB or lightheadedness with hypotension: Yes Has patient had a PCN reaction causing severe rash involving mucus membranes or skin necrosis: No Has patient had a PCN reaction that required hospitalization: No Has patient had a PCN reaction occurring within the last 10 years: No If all of the above answers are "NO", then may proceed with Cephalosporin use.   . Sulfamethoxazole Rash  . Sulfonamide Derivatives Rash     Home Medications  Prior to Admission medications   Medication Sig Start Date End Date Taking? Authorizing Provider  cephALEXin (KEFLEX) 500 MG capsule Take 500 mg by mouth every 8 (eight) hours. 09/12/17   [provider]  Cholecalciferol (VITAMIN D3 PO) Take 1 tablet by mouth daily with lunch.    [provider]  cloNIDine (CATAPRES) 0.2 MG tablet Take 1 tablet (0.2 mg total) by mouth 2 (two) times daily. Patient taking differently: Take 0.2 mg by mouth 3 (three) times daily.  07/27/17   Charolette Forward, MD  diltiazem (CARDIZEM CD) 180 MG 24 hr capsule Take 1 capsule (180 mg total) by mouth daily. 07/27/17   Charolette Forward, MD  doxycycline (VIBRAMYCIN) 100 MG capsule Take 1 capsule (100 mg total) by mouth 2 (two) times daily. Patient not taking: Reported on 12/31/2017 10/31/17   [place], [place], PA-C  feeding supplement, GLUCERNA SHAKE, (GLUCERNA SHAKE) LIQD Take 237 mLs by mouth See admin instructions. Drink one can (237 mls) by  mouth once or twice daily as meal replacement    [provider]  Insulin Detemir (LEVEMIR FLEXTOUCH) 100 UNIT/ML Pen Inject 18 Units into the skin 2 (two) times daily. Inject 20 units subcutaneously twice daily - after lunch and supper Patient taking differently: Inject 15-23 Units into the skin  See admin instructions. Use 25 units every morning and use 18 units every evening 07/27/17   Charolette Forward, MD  IRON PO Take 325 mg by mouth daily with lunch.     [provider]  losartan (COZAAR) 100 MG tablet Take 100 mg by mouth daily.    [provider]  metFORMIN (GLUCOPHAGE) 500 MG tablet Take 500 mg by mouth daily. 08/26/17   [provider]  Multiple Vitamin (MULTIVITAMIN WITH MINERALS) TABS tablet Take 1 tablet by mouth daily.    [provider]  omeprazole (PRILOSEC OTC) 20 MG tablet Take 20 mg by mouth daily with breakfast.     [provider]  polycarbophil (FIBERCON) 625 MG tablet Take 625 mg by mouth daily as needed for mild constipation.    [provider]  Rivaroxaban (XARELTO) 15 MG TABS tablet Take 1 tablet (15 mg total) by mouth daily with supper. 07/27/17   Charolette Forward, MD  spironolactone (ALDACTONE) 25 MG tablet Take 12.5 mg by mouth daily.  08/18/17   [provider]  vitamin B-12 (CYANOCOBALAMIN) 500 MCG tablet Take 500 mcg by mouth daily with lunch.    [provider]     Critical care time: 35 min     Georgann Housekeeper, AGACNP-BC Frannie Pager [phone] or [phone]  01/01/2018 7:18 AM

## 2018-01-01 NOTE — ED Triage Notes (Signed)
Pt in from home via [facility] after unwitnessed fall. EMS reported blood present in mouth on arrival to home, as well as a seizure en route. Arrives to ED with GCS of 3. Takes Xarelto, 200 SBP, sats 98% RA, snoring. Preparing for intubation on arrival

## 2018-01-01 NOTE — Procedures (Signed)
ELECTROENCEPHALOGRAM REPORT   Patient: [name]       Room #: [facility] EEG No. ID: [id number] Age: 84 y.o.        Sex: female Referring Physician: Ankit Report Date:  01/01/2018        Interpreting Physician: Alexis Goodell  History: [name] is an 84 y.o. female with seizure  Medications:  Keppra, Protonix, Propofol, Insulin  Conditions of Recording:  This is a 21 channel routine scalp EEG performed with bipolar and monopolar montages arranged in accordance to the international 10/20 system of electrode placement. One channel was dedicated to EKG recording.  The patient is in the intubated and sedated state.   Description:  The background activity is discontinuous. It consists of bursts of moderate to high voltage, disorganized delta and theta activity alternating with periods of attenuation. The burst activity lasts up to 2 seconds. The periods of attenuation last up to 8 seconds. This discontinuous activity is maintained for the majority of the recording.  Propofol is discontinued during the recording.  Towards the end there is noted to be more continuous poorly organized slow activity in longer bursts lasting up to 30 seconds that later takes on some of the characteristics of sleep.   Hyperventilation and intermittent photic stimulation were not performed.   IMPRESSION: This is an abnormal electroencephalogram secondary to a burst suppression rhythm that is consistent with the patient's medications.     Alexis Goodell, MD Neurology [phone] 01/01/2018, 11:37 AM

## 2018-01-01 NOTE — Progress Notes (Signed)
Patient transported to 3M02 from the ED without any apparent complications.

## 2018-01-01 NOTE — Consult Note (Signed)
Neurology Consultation Reason for Consult: Seizures Referring Physician: Kathrynn Humble, A  CC: Seizures  History is obtained from: Family  HPI: [name] is a 84 y.o. female with a history of diabetes, hypertension, stroke who developed lethargy and some slurred speech last night.  Due to this she presented to the emergency department where an evaluation was undertaken with MRI which was negative.  She was discharged home and then was found down on the floor in her bedroom by family. EMS was called and she was seen to have a seizure during EMS transport.  Following arrival here, she was obtunded taken for a stat head CT in the scanner she began having seizure again.  She ended up getting intubated.  CT was obtained which does not show any significant change.  Of note, her blood glucose is 596 and her blood pressures have been in the 200s.    ROS: A 14 point ROS was performed and is negative except as noted in the HPI.   Past Medical History:  Diagnosis Date  . ANEMIA, IRON DEFICIENCY 08/11/2008  . ANGIOEDEMA 03/01/2008  . CEREBROVASCULAR ACCIDENT, HX OF 03/01/2008  . CONTACT DERMATITIS&OTHER ECZEMA DUE UNSPEC CAUSE 11/28/2008  . DIABETES MELLITUS 03/01/2008  . Dysuria 01/04/2010  . GERD 03/01/2008  . HYPERSOMNIA 05/30/2009  . HYPERTENSION 03/01/2008  . INTERNAL HEMORRHOIDS 03/01/2008  . KNEE PAIN, RIGHT 09/05/2009  . NONSPECIFIC ABN FINDNG RAD&OTH EXAM BILARY TRCT 03/07/2008  . OBESITY 01/10/2010  . PERIPHERAL EDEMA 08/10/2008  . POLYP, GALLBLADDER 03/07/2008  . SMALL BOWEL OBSTRUCTION, HX OF 03/01/2008  . SNORING 05/11/2009  . Stroke ([place]) 04/08/2016   TIA  . THROMBOCYTOSIS 08/18/2008     Family History  Problem Relation Age of Onset  . Diabetes Mother   . Heart disease Mother   . Diabetes Sister   . Heart disease Sister   . Colitis Sister   . Kidney disease Brother   . Breast cancer Other        Neice  . Allergies Other        Nephrew  . Asthma Other        Nephrew     Social  History:  reports that she has never smoked. She has never used smokeless tobacco. She reports that she does not drink alcohol or use drugs.   Exam: Current vital signs: SpO2 100%  Vital signs in last 24 hours: Temp:  [98.4 F (36.9 C)] 98.4 F (36.9 C) (12/11 1705) Pulse Rate:  [69-85] 73 (12/11 2130) Resp:  [16-21] 18 (12/11 2130) BP: (131-171)/(49-83) 153/79 (12/11 2130) SpO2:  [98 %-100 %] 100 % (12/12 0510) FiO2 (%):  [40 %] 40 % (12/12 0600) Weight:  [81.6 kg] 81.6 kg (12/11 1702)   Physical Exam  Constitutional: Appears well-developed and well-nourished.  Psych: unresponsive Eyes: No scleral injection HENT: ET tube in place Head: Normocephalic.  Cardiovascular: Normal rate and regular rhythm.  Respiratory: ventilated GI: Soft.  No distension. There is no tenderness.  Skin: WDI  Neuro:(limited due to rocuronium 40 minutes prior ot exam) Mental Status: Patient is obtunded Cranial Nerves: II: PERRL, does not blink to threat III,IV, VI: Eyes midline, cannot check doll's due to C-collar V: VII: corneals absent bilaterally X: no cough  Motor: No response to noxious stimuli.  Sensory: As above Cerebellar: Does not perform   I have reviewed labs in epic and the results pertinent to this consultation are: Glucose 596 Cr 1.95 Na 133 Leukocytosis at 14.7  I have  reviewed the images obtained:  CT head- no acute findings  Impression: 84 year old female with new onset refractory seizures.  Given that she does not normally have blood sugars this time, my suspicion is that this could be the etiology.  Her blood pressures could suggest posterior reversible encephalopathy syndrome (PRES) but this was not present on her initial MRI.  Her exam is currently limited by paralytic, and a stat EEG is needed.  Recommendations: 1) stat EEG 2) continue Keppra at 500 mg daily, following initial load of 60 mg/kg 3) neurology will continue to follow   Roland Rack,  MD [facility] [phone]  If 7pm- 7am, please page neurology on call as listed in [place].

## 2018-01-01 NOTE — Progress Notes (Signed)
vLTM EEG running following spot EEG. No skin breakdown. Notified Neuro

## 2018-01-01 NOTE — Progress Notes (Signed)
[facility] Progress Note Patient Name: [name] DOB: 07-07-1932 MRN: [id number]   Date of Service  01/01/2018  HPI/Events of Note  Notified glucose <200 for several hours.  AG 10, CO2 20, creatinine 1.34, Cl 106.  eICU Interventions  Will transition insulin drip to basal subcutaneous insulin.     Intervention Category Major Interventions: Hyperglycemia - active titration of insulin therapy Intermediate Interventions: Electrolyte abnormality - evaluation and management  Judd Lien 01/01/2018, 10:06 PM

## 2018-01-01 NOTE — Progress Notes (Signed)
NAME:  [name], MRN:  [id number], DOB:  06/01/1932, LOS: 0 ADMISSION DATE:  01/01/2018, CONSULTATION DATE:  12/12 REFERRING MD:  Dr. Kathrynn Humble, CHIEF COMPLAINT:  Seizure   Brief History   84 year old female with past medical history of diabetes and hypertension admitted 12/12 for status epilepticus requiring intubation.  History of present illness   84 year old female with past medical history as below, which is significant for diabetes, hypertension, stroke, and small bowel obstruction.  Symptoms started 12/11 when she developed slurred speech while at the bank.  She had several episodes of this causing her to present to the emergency department in the evening hours of 12/11.  Work-up including CT scan and MRI were negative and she was ultimately discharged home.  Then in the early morning hours of 12/12 she was found to be unresponsive by her grandson.  EMS was called and in route to the emergency department she experienced seizure-like activity.  She had another witnessed seizure in the emergency department and was intubated for airway protection.  Repeat CT of the head was nonacute.  Neurology was consulted in the emergency department and has made recommendations for antiepileptic medications and started propofol infusion.  Past Medical History   has a past medical history of ANEMIA, IRON DEFICIENCY (08/11/2008), ANGIOEDEMA (03/01/2008), CEREBROVASCULAR ACCIDENT, HX OF (03/01/2008), CONTACT DERMATITIS&OTHER ECZEMA DUE UNSPEC CAUSE (11/28/2008), DIABETES MELLITUS (03/01/2008), Dysuria (01/04/2010), GERD (03/01/2008), HYPERSOMNIA (05/30/2009), HYPERTENSION (03/01/2008), INTERNAL HEMORRHOIDS (03/01/2008), KNEE PAIN, RIGHT (09/05/2009), NONSPECIFIC ABN FINDNG RAD&OTH EXAM BILARY TRCT (03/07/2008), OBESITY (01/10/2010), PERIPHERAL EDEMA (08/10/2008), POLYP, GALLBLADDER (03/07/2008), SMALL BOWEL OBSTRUCTION, HX OF (03/01/2008), SNORING (05/11/2009), Stroke ([place]) (04/08/2016), and THROMBOCYTOSIS  (08/18/2008).   Significant Hospital Events   12/11 to ED for slurred speech/TIA. Discharged 12/12 to ED found down. Seizures. Intubated   Consults:  Neurology  Procedures:  ETT 12/12  Significant Diagnostic Tests:  CT head 12/11 > No CT evidence for acute intracranial abnormality. Atrophy and small vessel ischemic changes of the white matter. MRI brain 12/11 > No acute intracranial process. Multiple old cerebellar infarcts. Moderate to severe chronic small vessel ischemic changes. CT head Cspine 12/12 > No acute intracranial hemorrhage. No acute/traumatic cervical spine pathology.  Micro Data:    Antimicrobials:    Interim history/subjective:    Objective   Blood pressure (!) 173/85, pulse (!) 102, resp. rate (!) 23, SpO2 100 %.    Vent Mode: PRVC FiO2 (%):  [40 %-60 %] 60 % Set Rate:  [18 bmp] 18 bmp Vt Set:  [360 mL-380 mL] 380 mL PEEP:  [5 cmH20] 5 cmH20 Plateau Pressure:  [14 cmH20] 14 cmH20   Intake/Output Summary (Last 24 hours) at 01/01/2018 0848 Last data filed at 01/01/2018 0645 Gross per 24 hour  Intake 1000 ml  Output -  Net 1000 ml   There were no vitals filed for this visit.  Examination: General: Morbid obese female sedated on vent HEENT: Pupils equal reactive at 4 mm, arcus senilis noted, no JVD no lymphadenopathy is appreciated Neuro: Sedated on vent, no seizure activity appreciated CV: Sounds are distant PULM: even/non-labored, lungs bilaterally diminished BW:IOMB, non-tender, bsx4 active, no yeast in groin Extremities: warm/dry, 1+ edema  Skin: no rashes or lesions   Resolved Hospital Problem list     Assessment & Plan:   Status epilepticus: No seizure history. She is hypertensive, which raises concern for PRES. She is also hyperglycemic with glucose nearing 600. Both of which could potentially cause seizure.  -As per neurology - on propofol  drip - per neurology  History of CVA on Xarelto -01/01/2018 we will hold anticoagulation  for 24 hours -CT of the head 01/01/2018 was unremarkable   Inability to protect airway in the setting of above -Continue full vent support -Serial ABG -Serial chest x-ray -Sputum culture for completeness ordered on 01/01/2018  Hyperglycemia: with history of DM -Hypoglycemia protocol -Currently on insulin drip per protocol -Every 4 hours glucose checks per protocol  Hypertensive urgency: possibility of PRES. Not described on imaging, including MRI from 12/11. -ICU monitor -PRN labetalol with goal systolic blood pressure 580 -Continue to monitor blood pressure may need further interventions  Metabolic acidosis with elevated anion gap: explained by lactic acid 13.  -Arterial blood gas was a pH of 7.31 and a PCO2 of 43 and a PO2 of 146 -Follow-up lactic acid has been ordered.  Best practice:  Diet: NPO Pain/Anxiety/Delirium protocol (if indicated): propofol for RASS -1 and seizure cessation VAP protocol (if indicated): Yes DVT prophylaxis: sq heparin GI prophylaxis: protonix Glucose control: SSI and IVF Mobility: BR Code Status: FULL Family Communication: 01/01/2018 0848 hrs. family at bedside Disposition: ICU, critically ill.   Labs   CBC: Recent Labs  Lab 12/31/17 1724 12/31/17 1813 01/01/18 0515 01/01/18 0520  WBC 9.2  --  14.7*  --   NEUTROABS 3.6  --  6.4  --   HGB 12.4 14.3 12.8 15.0  HCT 39.3 42.0 42.2 44.0  MCV 83.6  --  87.0  --   PLT 359  --  386  --     Basic Metabolic Panel: Recent Labs  Lab 12/31/17 1724 12/31/17 1813 01/01/18 0515 01/01/18 0517 01/01/18 0520  NA 128* 128* 133*  --  132*  K 5.2* 6.1* 4.1  --  4.1  CL 97* 99 98  --  103  CO2 21*  --  11*  --   --   GLUCOSE 512* 530* 578*  --  596*  BUN 31* 48* 31*  --  32*  CREATININE 1.40* 1.30* 1.95*  --  1.50*  CALCIUM 9.6  --  9.8  --   --   MG  --   --   --  2.2  --   PHOS  --   --   --  4.3  --    GFR: Estimated Creatinine Clearance: 27.5 mL/min (A) (by C-G formula based on SCr of  1.5 mg/dL (H)). Recent Labs  Lab 12/31/17 1724 01/01/18 0515 01/01/18 0520  WBC 9.2 14.7*  --   LATICACIDVEN  --   --  13.28*    Liver Function Tests: Recent Labs  Lab 12/31/17 1724 01/01/18 0515  AST 16 25  ALT 17 17  ALKPHOS 94 92  BILITOT 0.4 0.5  PROT 7.7 7.9  ALBUMIN 3.5 3.8   No results for input(s): LIPASE, AMYLASE in the last 168 hours. No results for input(s): AMMONIA in the last 168 hours.  ABG    Component Value Date/Time   PHART 7.317 (L) 01/01/2018 0808   PCO2ART 43.1 01/01/2018 0808   PO2ART 146.0 (H) 01/01/2018 0808   HCO3 22.0 01/01/2018 0808   TCO2 23 01/01/2018 0808   ACIDBASEDEF 4.0 (H) 01/01/2018 0808   O2SAT 99.0 01/01/2018 0808     Coagulation Profile: Recent Labs  Lab 12/31/17 1724  INR 1.18    Cardiac Enzymes: Recent Labs  Lab 01/01/18 0515  CKTOTAL 59    HbA1C: Hgb A1c MFr Bld  Date/Time Value Ref Range Status  07/26/2017  12:03 AM 10.6 (H) 4.8 - 5.6 % Final    Comment:    (NOTE) Pre diabetes:          5.7%-6.4% Diabetes:              >6.4% Glycemic control for   <7.0% adults with diabetes   04/08/2016 06:11 AM 10.8 (H) 4.8 - 5.6 % Final    Comment:    (NOTE)         Pre-diabetes: 5.7 - 6.4         Diabetes: >6.4         Glycemic control for adults with diabetes: <7.0     CBG: Recent Labs  Lab 12/31/17 2122 01/01/18 0512 01/01/18 0727  GLUCAP 436* 527* 468*      Critical care time: 35 min     Richardson Landry  ACNP [facility] Pager [phone] till 1 pm If no answer page [phone] 01/01/2018, 8:48 AM

## 2018-01-01 NOTE — ED Provider Notes (Signed)
[facility] EMERGENCY DEPARTMENT Provider Note   CSN: [id number] Arrival date & time: 01/01/18  1517     History   Chief Complaint Chief Complaint  Patient presents with  . Fall  . Head Injury    HPI KAMEKA WHAN is a 84 y.o. female.  HPI Level 5 caveat for altered sensorium.  84 year old female with history of diabetes, CVA comes in a chief complaint of altered mental status. According to patient's daughter, patient was seen in the ER yesterday and discharged home with her.  In the middle of the night her son noticed that patient was laying on the floor face down.  Patient was unresponsive therefore EMS was called.  Our records indicate that patient was seen in the ED yesterday.  She had come in with aphasia-dysarthria and had full stroke work-up.  Her work-up was negative and there was request to admit her for TIA, however patient wanted to go home and agreed to go home with her daughter.  There has been no recent infection, fevers, nausea or vomiting.  According to EMS, in route to the emergency room patient had posturing with mild tremors.  No medications were given by them for this episode that lasted for 2 or 3 minutes.  There is no history of seizures.  Past Medical History:  Diagnosis Date  . ANEMIA, IRON DEFICIENCY 08/11/2008  . ANGIOEDEMA 03/01/2008  . CEREBROVASCULAR ACCIDENT, HX OF 03/01/2008  . CONTACT DERMATITIS&OTHER ECZEMA DUE UNSPEC CAUSE 11/28/2008  . DIABETES MELLITUS 03/01/2008  . Dysuria 01/04/2010  . GERD 03/01/2008  . HYPERSOMNIA 05/30/2009  . HYPERTENSION 03/01/2008  . INTERNAL HEMORRHOIDS 03/01/2008  . KNEE PAIN, RIGHT 09/05/2009  . NONSPECIFIC ABN FINDNG RAD&OTH EXAM BILARY TRCT 03/07/2008  . OBESITY 01/10/2010  . PERIPHERAL EDEMA 08/10/2008  . POLYP, GALLBLADDER 03/07/2008  . SMALL BOWEL OBSTRUCTION, HX OF 03/01/2008  . SNORING 05/11/2009  . Stroke ([place]) 04/08/2016   TIA  . THROMBOCYTOSIS 08/18/2008    Patient Active Problem List   Diagnosis Date Noted  . Status epilepticus ([place]) 01/01/2018  . Accelerated hypertension 01/01/2018  . Acute kidney injury ([place]) 01/01/2018  . Type 2 diabetes mellitus ([place]) 01/01/2018  . Hyperkalemia 07/25/2017  . Bradycardia 07/25/2017  . Hypotension 07/25/2017  . History of stroke 07/25/2017  . ARF (acute renal failure) ([place]) 07/25/2017  . TIA (transient ischemic attack) 04/08/2016  . Hypocalcemia 04/08/2016  . OBESITY 01/10/2010  . DYSURIA 01/04/2010  . KNEE PAIN, RIGHT 09/05/2009  . HYPERSOMNIA 05/30/2009  . URI 05/11/2009  . SNORING 05/11/2009  . CONTACT DERMATITIS&OTHER ECZEMA DUE UNSPEC CAUSE 11/28/2008  . THROMBOCYTOSIS 08/18/2008  . ANEMIA, IRON DEFICIENCY 08/11/2008  . PERIPHERAL EDEMA 08/10/2008  . POLYP, GALLBLADDER 03/07/2008  . NONSPECIFIC ABN FINDNG RAD&OTH EXAM BILARY TRCT 03/07/2008  . Diabetes mellitus type II, uncontrolled ([place]) 03/01/2008  . Essential hypertension 03/01/2008  . INTERNAL HEMORRHOIDS 03/01/2008  . GERD 03/01/2008  . ANGIOEDEMA 03/01/2008  . CEREBROVASCULAR ACCIDENT, HX OF 03/01/2008  . SMALL BOWEL OBSTRUCTION, HX OF 03/01/2008    Past Surgical History:  Procedure Laterality Date  . HERNIA REPAIR  09/14/08   with release [facility]-Dr. Dalbert Batman  . SMALL INTESTINE SURGERY    . 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      Home Medications    Prior to Admission medications   Medication Sig Start Date End Date  Taking? Authorizing Provider  cephALEXin (KEFLEX) 500 MG capsule Take 500 mg by mouth every 8 (eight) hours. 09/12/17   [provider]  Cholecalciferol (VITAMIN D3 PO) Take 1 tablet by mouth daily with lunch.    [provider]  cloNIDine (CATAPRES) 0.2 MG tablet Take 1 tablet (0.2 mg total) by mouth 2 (two) times daily. Patient taking differently: Take 0.2 mg by mouth 3 (three) times daily.  07/27/17   Charolette Forward, MD  diltiazem  (CARDIZEM CD) 180 MG 24 hr capsule Take 1 capsule (180 mg total) by mouth daily. 07/27/17   Charolette Forward, MD  doxycycline (VIBRAMYCIN) 100 MG capsule Take 1 capsule (100 mg total) by mouth 2 (two) times daily. Patient not taking: Reported on 12/31/2017 10/31/17   [place], [place], PA-C  feeding supplement, GLUCERNA SHAKE, (GLUCERNA SHAKE) LIQD Take 237 mLs by mouth See admin instructions. Drink one can (237 mls) by mouth once or twice daily as meal replacement    [provider]  Insulin Detemir (LEVEMIR FLEXTOUCH) 100 UNIT/ML Pen Inject 18 Units into the skin 2 (two) times daily. Inject 20 units subcutaneously twice daily - after lunch and supper Patient taking differently: Inject 15-23 Units into the skin See admin instructions. Use 25 units every morning and use 18 units every evening 07/27/17   Charolette Forward, MD  IRON PO Take 325 mg by mouth daily with lunch.     [provider]  losartan (COZAAR) 100 MG tablet Take 100 mg by mouth daily.    [provider]  metFORMIN (GLUCOPHAGE) 500 MG tablet Take 500 mg by mouth daily. 08/26/17   [provider]  Multiple Vitamin (MULTIVITAMIN WITH MINERALS) TABS tablet Take 1 tablet by mouth daily.    [provider]  omeprazole (PRILOSEC OTC) 20 MG tablet Take 20 mg by mouth daily with breakfast.     [provider]  polycarbophil (FIBERCON) 625 MG tablet Take 625 mg by mouth daily as needed for mild constipation.    [provider]  Rivaroxaban (XARELTO) 15 MG TABS tablet Take 1 tablet (15 mg total) by mouth daily with supper. 07/27/17   Charolette Forward, MD  spironolactone (ALDACTONE) 25 MG tablet Take 12.5 mg by mouth daily.  08/18/17   [provider]  vitamin B-12 (CYANOCOBALAMIN) 500 MCG tablet Take 500 mcg by mouth daily with lunch.    [provider]    Family History Family History  Problem Relation Age of Onset  . Diabetes Mother   . Heart disease Mother   . Diabetes  Sister   . Heart disease Sister   . Colitis Sister   . Kidney disease Brother   . Breast cancer Other        Neice  . Allergies Other        Nephrew  . Asthma Other        Nephrew    Social History Social History   Tobacco Use  . Smoking status: Never Smoker  . Smokeless tobacco: Never Used  Substance Use Topics  . Alcohol use: No  . Drug use: No     Allergies   Ace inhibitors; Prednisone; Sulfa antibiotics; Penicillins; Sulfamethoxazole; and Sulfonamide derivatives   Review of Systems Review of Systems  Unable to perform ROS: Patient unresponsive     Physical Exam Updated Vital Signs BP (!) 153/83   Pulse (!) 102   Resp (!) 21   SpO2 100%   Physical Exam Vitals signs and nursing note  reviewed.  Constitutional:      Comments: Unresponsive  HENT:     Mouth/Throat:     Comments: Dry blood by the mouth noted. Eyes:     Comments: Pupils are pinpoint bilaterally.  Neck:     Comments: In c-collar Cardiovascular:     Comments: Tachycardia Pulmonary:     Effort: Pulmonary effort is normal.  Skin:    General: Skin is warm.  Neurological:     Comments: Unresponsive      ED Treatments / Results  Labs (all labs ordered are listed, but only abnormal results are displayed) Labs Reviewed  CBC WITH DIFFERENTIAL/PLATELET - Abnormal; Notable for the following components:      Result Value   WBC 14.7 (*)    RDW 15.8 (*)    Lymphs Abs 6.6 (*)    Monocytes Absolute 1.4 (*)    Abs Immature Granulocytes 0.09 (*)    All other components within normal limits  COMPREHENSIVE METABOLIC PANEL - Abnormal; Notable for the following components:   Sodium 133 (*)    CO2 11 (*)    Glucose, Bld 578 (*)    BUN 31 (*)    Creatinine, Ser 1.95 (*)    GFR calc non Af Amer 23 (*)    GFR calc Af Amer 27 (*)    Anion gap 24 (*)    All other components within normal limits  CBG MONITORING, ED - Abnormal; Notable for the following components:   Glucose-Capillary 527 (*)     All other components within normal limits  I-STAT CHEM 8, ED - Abnormal; Notable for the following components:   Sodium 132 (*)    BUN 32 (*)    Creatinine, Ser 1.50 (*)    Glucose, Bld 596 (*)    TCO2 14 (*)    All other components within normal limits  I-STAT CG4 LACTIC ACID, ED - Abnormal; Notable for the following components:   Lactic Acid, Venous 13.28 (*)    All other components within normal limits  CBG MONITORING, ED - Abnormal; Notable for the following components:   Glucose-Capillary 468 (*)    All other components within normal limits  CK  MAGNESIUM  PHOSPHORUS  BLOOD GAS, ARTERIAL  TRIGLYCERIDES  BASIC METABOLIC PANEL  BASIC METABOLIC PANEL  BASIC METABOLIC PANEL  BASIC METABOLIC PANEL  BASIC METABOLIC PANEL  HEMOGLOBIN A1C  CBC  CREATININE, SERUM  TRIGLYCERIDES  BLOOD GAS, ARTERIAL  LACTIC ACID, PLASMA  LACTIC ACID, PLASMA  I-STAT TROPONIN, ED    EKG EKG Interpretation  Date/Time:  Thursday January 01 2018 05:09:13 EST Ventricular Rate:  109 PR Interval:    QRS Duration: 120 QT Interval:  348 QTC Calculation: 469 R Axis:   -78 Text Interpretation:  Ectopic atrial tachycardia, unifocal Incomplete left bundle branch block Left ventricular hypertrophy Inferior infarct, acute (RCA) Anterior Q waves, possibly due to LVH Probable RV involvement, suggest recording right precordial leads No significant change since last tracing Confirmed by Varney Biles [phone]) on 01/01/2018 5:21:30 AM   Radiology Ct Head Wo Contrast  Result Date: 01/01/2018 CLINICAL DATA:  84 year old female with head trauma. EXAM: CT HEAD WITHOUT CONTRAST CT CERVICAL SPINE WITHOUT CONTRAST TECHNIQUE: Multidetector CT imaging of the head and cervical spine was performed following the standard protocol without intravenous contrast. Multiplanar CT image reconstructions of the cervical spine were also generated. COMPARISON:  Head CT dated 12/31/2017 an MRI dated 12/31/2017 FINDINGS: CT HEAD  FINDINGS Brain: There is moderate age-related atrophy  and chronic microvascular ischemic changes. There is no acute intracranial hemorrhage. No mass effect or midline shift. No extra-axial fluid collection. Vascular: No hyperdense vessel or unexpected calcification. Skull: Normal. Negative for fracture or focal lesion. Sinuses/Orbits: Partial opacification of the right sphenoid sinus. The remainder of the visualized paranasal sinuses and mastoid air cells are clear. Other: An enteric tube is partially visualized. CT CERVICAL SPINE FINDINGS Alignment: No acute subluxation. Skull base and vertebrae: No acute fracture. Osteopenia. Soft tissues and spinal canal: No prevertebral fluid or swelling. No visible canal hematoma. Disc levels:  Degenerative changes primarily at C4-C6. Upper chest: Atherosclerotic calcification of the aortic arch. Heterogeneous and mildly enlarged thyroid gland with multiple hypodense nodules. Further evaluation with ultrasound on a nonemergent basis recommended. Other: An endotracheal and an enteric tube are partially visualized. IMPRESSION: 1. No acute intracranial hemorrhage. 2. No acute/traumatic cervical spine pathology. Electronically Signed   By: Anner Crete M.D.   On: 01/01/2018 06:28   Ct Head Wo Contrast  Result Date: 12/31/2017 CLINICAL DATA:  Slurred speech EXAM: CT HEAD WITHOUT CONTRAST TECHNIQUE: Contiguous axial images were obtained from the base of the skull through the vertex without intravenous contrast. COMPARISON:  CT brain 09/16/2016, MRI 04/09/2016 FINDINGS: Brain: No acute territorial infarction, hemorrhage, or intracranial mass is visualized. Atrophy with moderate to marked small vessel ischemic changes of the white matter. Chronic lacunar infarcts within the basal ganglia and cerebellum. Stable ventricle size. Vascular: No hyperdense vessels.  Carotid vascular calcification. Skull: Normal. Negative for fracture or focal lesion. Sinuses/Orbits: No acute finding.  Other: None IMPRESSION: 1. No CT evidence for acute intracranial abnormality. 2. Atrophy and small vessel ischemic changes of the white matter. Electronically Signed   By: Donavan Foil M.D.   On: 12/31/2017 18:35   Ct Cervical Spine Wo Contrast  Result Date: 01/01/2018 CLINICAL DATA:  84 year old female with head trauma. EXAM: CT HEAD WITHOUT CONTRAST CT CERVICAL SPINE WITHOUT CONTRAST TECHNIQUE: Multidetector CT imaging of the head and cervical spine was performed following the standard protocol without intravenous contrast. Multiplanar CT image reconstructions of the cervical spine were also generated. COMPARISON:  Head CT dated 12/31/2017 an MRI dated 12/31/2017 FINDINGS: CT HEAD FINDINGS Brain: There is moderate age-related atrophy and chronic microvascular ischemic changes. There is no acute intracranial hemorrhage. No mass effect or midline shift. No extra-axial fluid collection. Vascular: No hyperdense vessel or unexpected calcification. Skull: Normal. Negative for fracture or focal lesion. Sinuses/Orbits: Partial opacification of the right sphenoid sinus. The remainder of the visualized paranasal sinuses and mastoid air cells are clear. Other: An enteric tube is partially visualized. CT CERVICAL SPINE FINDINGS Alignment: No acute subluxation. Skull base and vertebrae: No acute fracture. Osteopenia. Soft tissues and spinal canal: No prevertebral fluid or swelling. No visible canal hematoma. Disc levels:  Degenerative changes primarily at C4-C6. Upper chest: Atherosclerotic calcification of the aortic arch. Heterogeneous and mildly enlarged thyroid gland with multiple hypodense nodules. Further evaluation with ultrasound on a nonemergent basis recommended. Other: An endotracheal and an enteric tube are partially visualized. IMPRESSION: 1. No acute intracranial hemorrhage. 2. No acute/traumatic cervical spine pathology. Electronically Signed   By: Anner Crete M.D.   On: 01/01/2018 06:28   Mr Brain  Wo Contrast  Result Date: 12/31/2017 CLINICAL DATA:  Recurrent slurred speech this afternoon. History of hypertension and diabetes. EXAM: MRI HEAD WITHOUT CONTRAST TECHNIQUE: Multiplanar, multiecho pulse sequences of the brain and surrounding structures were obtained without intravenous contrast. COMPARISON:  CT HEAD December 31, 2017  FINDINGS: INTRACRANIAL CONTENTS: No reduced diffusion to suggest acute ischemia. No susceptibility artifact to suggest hemorrhage. Old bilateral cerebellar infarcts. Confluent supratentorial white matter FLAIR T2 hyperintensities. No midline shift, mass effect or masses. Scattered cerebellar and to lesser extent central cerebrum more chronic microhemorrhages. Prominent basal ganglia and thalami perivascular spaces associated with chronic hypertension. Moderate parenchymal brain volume loss. No hydrocephalus. No abnormal extra-axial fluid collections. Small RIGHT parafalcine arachnoid cyst. VASCULAR: Normal major intracranial vascular flow voids present at skull base. SKULL AND UPPER CERVICAL SPINE: No abnormal sellar expansion. No suspicious calvarial bone marrow signal. Craniocervical junction maintained. SINUSES/ORBITS: The mastoid air-cells and included paranasal sinuses are well-aerated.The included ocular globes and orbital contents are non-suspicious. Status post RIGHT ocular lens implant. OTHER: LEFT nasal skin nodule.  Recommend direct inspection. IMPRESSION: 1. No acute intracranial process. 2. Multiple old cerebellar infarcts. 3. Moderate to severe chronic small vessel ischemic changes. Electronically Signed   By: Elon Alas M.D.   On: 12/31/2017 20:26   Dg Chest Port 1 View  Result Date: 01/01/2018 CLINICAL DATA:  84 y/o F; post intubation and enteric tube placement. EXAM: PORTABLE CHEST 1 VIEW COMPARISON:  01/01/2018 chest radiograph. FINDINGS: Stable normal cardiac silhouette given projection and technique. Aortic atherosclerosis with calcification. Enteric  tube tip projects 2.1 cm above the carina. Enteric tube tip extends the proximal stomach, the proximal side hole is in the distal esophagus. Linear opacity at left lung base compatible with atelectasis. No pleural effusion or pneumothorax. No acute osseous abnormality identified. IMPRESSION: 1. Enteric tube tip 2.1 cm above carina. Endotracheal tube tip in proximal stomach, approximately 5 cm advancement recommended. 2. Left basilar atelectasis. Electronically Signed   By: Kristine Garbe M.D.   On: 01/01/2018 06:23   Dg Chest Port 1 View  Result Date: 01/01/2018 CLINICAL DATA:  84 year old female with altered mental status. EXAM: PORTABLE CHEST 1 VIEW COMPARISON:  Chest radiograph dated 07/25/2017 FINDINGS: Shallow inspiration. No focal consolidation, pleural effusion, or pneumothorax. Mild cardiomegaly. Atherosclerotic calcification of the aortic arch. No acute osseous pathology. Left upper quadrant surgical clips noted. There is gaseous distention of the visualized colon. IMPRESSION: No acute cardiopulmonary process. Electronically Signed   By: Anner Crete M.D.   On: 01/01/2018 05:29    Procedures Procedure Name: Intubation Date/Time: 01/01/2018 5:42 AM Performed by: Varney Biles, MD Pre-anesthesia Checklist: Patient identified, Patient being monitored, Emergency Drugs available, Timeout performed and Suction available Oxygen Delivery Method: Non-rebreather mask Preoxygenation: Pre-oxygenation with 100% oxygen Induction Type: Rapid sequence Ventilation: Mask ventilation without difficulty Laryngoscope Size: Glidescope and 4 Tube size: 7.5 mm Number of attempts: 1 Placement Confirmation: ETT inserted through vocal cords under direct vision,  CO2 detector and Breath sounds checked- equal and bilateral Secured at: 23 cm Tube secured with: ETT holder    .Critical Care Performed by: Varney Biles, MD Authorized by: Varney Biles, MD   Critical care provider statement:     Critical care time (minutes):  45   Critical care start time:  01/01/2018 5:32 AM   Critical care end time:  01/01/2018 7:32 AM   Critical care time was exclusive of:  Separately billable procedures and treating other patients   Critical care was necessary to treat or prevent imminent or life-threatening deterioration of the following conditions:  CNS failure or compromise   Critical care was time spent personally by me on the following activities:  Discussions with consultants, evaluation of patient's response to treatment, examination of patient, ordering and performing treatments and interventions, ordering  and review of laboratory studies, ordering and review of radiographic studies, pulse oximetry, re-evaluation of patient's condition, obtaining history from patient or surrogate, review of old charts, development of treatment plan with patient or surrogate and ventilator management   I assumed direction of critical care for this patient from another provider in my specialty: yes     (including critical care time)  Medications Ordered in ED Medications  LORazepam (ATIVAN) 2 MG/ML injection (has no administration in time range)  midazolam (VERSED) injection 1 mg (has no administration in time range)  midazolam (VERSED) injection 1 mg (has no administration in time range)  fentaNYL (SUBLIMAZE) injection 50 mcg (has no administration in time range)  fentaNYL (SUBLIMAZE) injection 50 mcg (has no administration in time range)  insulin regular, human (MYXREDLIN) 100 units/ 100 mL infusion (has no administration in time range)  potassium chloride 10 mEq in 100 mL IVPB (has no administration in time range)  dextrose 5 %-0.45 % sodium chloride infusion (has no administration in time range)  lactated ringers infusion (has no administration in time range)  lactated ringers bolus 1,000 mL (has no administration in time range)  heparin injection 5,000 Units (has no administration in time range)    pantoprazole (PROTONIX) injection 40 mg (has no administration in time range)  0.9 %  sodium chloride infusion (has no administration in time range)  acetaminophen (TYLENOL) tablet 650 mg (has no administration in time range)  propofol (DIPRIVAN) 1000 MG/100ML infusion (has no administration in time range)  labetalol (NORMODYNE,TRANDATE) injection 20 mg (has no administration in time range)  levETIRAcetam (KEPPRA) IVPB 500 mg/100 mL premix (has no administration in time range)  sodium chloride 0.9 % bolus 1,000 mL (0 mLs Intravenous Stopped 01/01/18 0645)  rocuronium (ZEMURON) injection 75 mg (75 mg Intravenous Given 01/01/18 0540)  etomidate (AMIDATE) injection 20 mg (20 mg Intravenous Given 01/01/18 0539)  LORazepam (ATIVAN) injection 4 mg (4 mg Intravenous Given 01/01/18 0622)  levETIRAcetam (KEPPRA) 4,000 mg in sodium chloride 0.9 % 100 mL IVPB (4,000 mg Intravenous New Bag/Given 01/01/18 0646)     Initial Impression / Assessment and Plan / ED Course  I have reviewed the triage vital signs and the nursing notes.  Pertinent labs & imaging results that were available during my care of the patient were reviewed by me and considered in my medical decision making (see chart for details).  Clinical Course as of Jan 01 730  Thu Jan 01, 2018  0530 While in the CT scanner patient started having generalized tonic-clonic seizure-like activity.  The episode lasted for about 2 to 3 minutes and resolved on its own. I discussed the findings with the patient's daughter and decision was made to intubate.   [AN]  0730 No acute findings noted on my review.  CT Head Wo Contrast [AN]  9211 Patient has metabolic acidosis with pH of 7.1.  Her lactate is 13, which is likely because of seizure.   Lactic Acid, Venous(!!): 13.28 [AN]  0731 Patient's blood sugar is noted to be 596.  She also has anion gap.  DKA protocol initiated.  Glucose(!!): 596 [AN]  0731 Discussed case with neurology.  They recommend  that we give patient 4 g of Keppra and also 4 mg of Ativan.  Critical care team consulted for admission.   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  It is unknown why she had a fall in first place, however a preceding seizure is possible. Patient was just seen in the ER yesterday and had a full stroke work-up, with MRI showing no acute findings.  There is no history of seizures.  Patient is on Xarelto because of her previous strokes.  CT head ordered to ensure there is no brain bleed.  Additionally, we will check electrolytes to ensure there is no metabolic derangement that is causing the seizures. EKG is reassuring.   Patient is full code. Final Clinical Impressions(s) / ED Diagnoses   Final diagnoses:  Status epilepticus ([place])  Diabetic ketoacidosis without coma associated with type 1 diabetes mellitus [facility])    ED Discharge Orders    None       Varney Biles, MD 01/01/18 [phone]

## 2018-01-01 NOTE — Progress Notes (Signed)
EEG completed, results pending. 

## 2018-01-02 ENCOUNTER — Inpatient Hospital Stay (HOSPITAL_COMMUNITY): Payer: Medicare HMO

## 2018-01-02 DIAGNOSIS — R4182 Altered mental status, unspecified: Secondary | ICD-10-CM

## 2018-01-02 LAB — BASIC METABOLIC PANEL
Anion gap: 11 (ref 5–15)
BUN: 19 mg/dL (ref 8–23)
CO2: 20 mmol/L — ABNORMAL LOW (ref 22–32)
CREATININE: 1.23 mg/dL — AB (ref 0.44–1.00)
Calcium: 8.7 mg/dL — ABNORMAL LOW (ref 8.9–10.3)
Chloride: 104 mmol/L (ref 98–111)
GFR calc Af Amer: 47 mL/min — ABNORMAL LOW (ref >60)
GFR calc non Af Amer: 41 mL/min — ABNORMAL LOW (ref >60)
Glucose, Bld: 291 mg/dL — ABNORMAL HIGH (ref 70–99)
Potassium: 3.9 mmol/L (ref 3.5–5.1)
Sodium: 135 mmol/L (ref 135–145)

## 2018-01-02 LAB — POCT I-STAT 3, ART BLOOD GAS (G3+)
Acid-base deficit: 5 mmol/L — ABNORMAL HIGH (ref 0.0–2.0)
Bicarbonate: 20.3 mmol/L (ref 20.0–28.0)
O2 SAT: 99 %
PCO2 ART: 38.2 mmHg (ref 32.0–48.0)
Patient temperature: 98.8
TCO2: 21 mmol/L — ABNORMAL LOW (ref 22–32)
pH, Arterial: 7.334 — ABNORMAL LOW (ref 7.350–7.450)
pO2, Arterial: 147 mmHg — ABNORMAL HIGH (ref 83.0–108.0)

## 2018-01-02 LAB — CBC WITH DIFFERENTIAL/PLATELET
Abs Immature Granulocytes: 0.06 10*3/uL (ref 0.00–0.07)
Basophils Absolute: 0 10*3/uL (ref 0.0–0.1)
Basophils Relative: 0 %
Eosinophils Absolute: 0 10*3/uL (ref 0.0–0.5)
Eosinophils Relative: 0 %
HCT: 37.9 % (ref 36.0–46.0)
Hemoglobin: 12.1 g/dL (ref 12.0–15.0)
Immature Granulocytes: 0 %
Lymphocytes Relative: 17 %
Lymphs Abs: 2.3 10*3/uL (ref 0.7–4.0)
MCH: 26.4 pg (ref 26.0–34.0)
MCHC: 31.9 g/dL (ref 30.0–36.0)
MCV: 82.6 fL (ref 80.0–100.0)
MONOS PCT: 10 %
Monocytes Absolute: 1.3 10*3/uL — ABNORMAL HIGH (ref 0.1–1.0)
Neutro Abs: 9.7 10*3/uL — ABNORMAL HIGH (ref 1.7–7.7)
Neutrophils Relative %: 73 %
Platelets: 370 10*3/uL (ref 150–400)
RBC: 4.59 MIL/uL (ref 3.87–5.11)
RDW: 15.8 % — ABNORMAL HIGH (ref 11.5–15.5)
WBC: 13.4 10*3/uL — ABNORMAL HIGH (ref 4.0–10.5)
nRBC: 0 % (ref 0.0–0.2)

## 2018-01-02 LAB — LACTIC ACID, PLASMA: Lactic Acid, Venous: 0.9 mmol/L (ref 0.5–1.9)

## 2018-01-02 LAB — PHOSPHORUS
Phosphorus: 2.6 mg/dL (ref 2.5–4.6)
Phosphorus: 2.6 mg/dL (ref 2.5–4.6)

## 2018-01-02 LAB — GLUCOSE, CAPILLARY
Glucose-Capillary: 149 mg/dL — ABNORMAL HIGH (ref 70–99)
Glucose-Capillary: 251 mg/dL — ABNORMAL HIGH (ref 70–99)
Glucose-Capillary: 265 mg/dL — ABNORMAL HIGH (ref 70–99)
Glucose-Capillary: 211 mg/dL — ABNORMAL HIGH (ref 70–99)
Glucose-Capillary: 98 mg/dL (ref 70–99)
Glucose-Capillary: 151 mg/dL — ABNORMAL HIGH (ref 70–99)
Glucose-Capillary: 302 mg/dL — ABNORMAL HIGH (ref 70–99)

## 2018-01-02 LAB — PROCALCITONIN: Procalcitonin: 0.72 ng/mL

## 2018-01-02 LAB — MAGNESIUM
Magnesium: 1.8 mg/dL (ref 1.7–2.4)
Magnesium: 1.9 mg/dL (ref 1.7–2.4)

## 2018-01-02 MED ORDER — INSULIN DETEMIR 100 UNIT/ML ~~LOC~~ SOLN
20.0000 [IU] | Freq: Two times a day (BID) | SUBCUTANEOUS | Status: DC
  Administered 2018-01-02 – 2018-01-07 (×11): 20 [IU] via SUBCUTANEOUS
  Filled 2018-01-02 (×13): qty 0.2

## 2018-01-02 MED ORDER — DILTIAZEM 12 MG/ML ORAL SUSPENSION
60.0000 mg | Freq: Three times a day (TID) | ORAL | Status: DC
  Administered 2018-01-02 – 2018-01-05 (×11): 60 mg
  Filled 2018-01-02 (×13): qty 6

## 2018-01-02 MED ORDER — INSULIN ASPART 100 UNIT/ML ~~LOC~~ SOLN
0.0000 [IU] | SUBCUTANEOUS | Status: DC
  Administered 2018-01-02: 11 [IU] via SUBCUTANEOUS
  Administered 2018-01-02: 5 [IU] via SUBCUTANEOUS
  Administered 2018-01-02: 8 [IU] via SUBCUTANEOUS
  Administered 2018-01-02: 3 [IU] via SUBCUTANEOUS
  Administered 2018-01-02: 8 [IU] via SUBCUTANEOUS
  Administered 2018-01-03: 3 [IU] via SUBCUTANEOUS
  Administered 2018-01-03: 5 [IU] via SUBCUTANEOUS
  Administered 2018-01-03: 3 [IU] via SUBCUTANEOUS
  Administered 2018-01-03: 11 [IU] via SUBCUTANEOUS
  Administered 2018-01-03 – 2018-01-04 (×3): 5 [IU] via SUBCUTANEOUS
  Administered 2018-01-04 (×2): 8 [IU] via SUBCUTANEOUS
  Administered 2018-01-04: 5 [IU] via SUBCUTANEOUS
  Administered 2018-01-04: 11 [IU] via SUBCUTANEOUS
  Administered 2018-01-04: 5 [IU] via SUBCUTANEOUS
  Administered 2018-01-05: 3 [IU] via SUBCUTANEOUS
  Administered 2018-01-05 (×2): 8 [IU] via SUBCUTANEOUS
  Administered 2018-01-05: 15 [IU] via SUBCUTANEOUS
  Administered 2018-01-06: 3 [IU] via SUBCUTANEOUS
  Administered 2018-01-06: 11 [IU] via SUBCUTANEOUS
  Administered 2018-01-06: 2 [IU] via SUBCUTANEOUS
  Administered 2018-01-06: 8 [IU] via SUBCUTANEOUS
  Administered 2018-01-06: 11 [IU] via SUBCUTANEOUS
  Administered 2018-01-07: 5 [IU] via SUBCUTANEOUS
  Administered 2018-01-07 (×2): 2 [IU] via SUBCUTANEOUS

## 2018-01-02 MED ORDER — VITAL HIGH PROTEIN PO LIQD
1000.0000 mL | ORAL | Status: DC
  Administered 2018-01-02 – 2018-01-04 (×3): 1000 mL

## 2018-01-02 MED ORDER — CLONIDINE HCL 0.2 MG PO TABS
0.2000 mg | ORAL_TABLET | Freq: Two times a day (BID) | ORAL | Status: DC
  Administered 2018-01-02 – 2018-01-06 (×9): 0.2 mg
  Filled 2018-01-02 (×9): qty 1

## 2018-01-02 MED ORDER — INSULIN ASPART 100 UNIT/ML ~~LOC~~ SOLN: 0.0000 [IU] | Freq: Three times a day (TID) | SUBCUTANEOUS | Status: DC

## 2018-01-02 NOTE — Progress Notes (Signed)
Pt transferred to MRI without complications.

## 2018-01-02 NOTE — Progress Notes (Signed)
[facility] Progress Note Patient Name: [name] DOB: 1932-07-20 MRN: [id number]   Date of Service  01/02/2018  HPI/Events of Note  Notified that CT spine done yesterday morning was without fracture or any pathology.  eICU Interventions  Ok to remove C spine collar     Intervention Category Minor Interventions: Other:  Judd Lien 01/02/2018, 9:10 PM

## 2018-01-02 NOTE — Progress Notes (Signed)
Subjective: On LTM EEG with no seizure-like events overnight.   Objective: Current vital signs: BP (!) 190/76   Pulse 94   Temp 98.8 F (37.1 C) (Axillary)   Resp 19   Ht _0  (1.549 m)   Wt 84.1 kg   SpO2 100%   BMI 35.03 kg/m  Vital signs in last 24 hours: Temp:  [97.6 F (36.4 C)-100.3 F (37.9 C)] 98.8 F (37.1 C) (12/13 1141) Pulse Rate:  [75-94] 94 (12/13 1135) Resp:  [0-25] 19 (12/13 1135) BP: (118-190)/(59-127) 190/76 (12/13 1135) SpO2:  [100 %] 100 % (12/13 1135) FiO2 (%):  [40 %] 40 % (12/13 1135) Weight:  [84.1 kg] 84.1 kg (12/12 1501)  Intake/Output from previous day: 12/12 0701 - 12/13 0700 In: 2684.9 [I.V.:2684.9] Out: 2142 [Urine:2142] Intake/Output this shift: Total I/O In: 885.6 [I.V.:785.1; IV Piggyback:100.5] Out: 3976 [Urine:1100; Emesis/NG output:75] Nutritional status:  Diet Order            Diet NPO time specified  Diet effective now              Neurologic Exam: Performed while on propofol at 7 mcg/kg/min. Ment: Somnolent to obtunded Opens eyes to light sternal rub. Moves extremities to noxious stimuli. Not following commands.  CN: Shuts eyes tightly when attempting to assess pupillary light reflexes. Does not gaze at examiner or track visual stimuli, but appears to look briefly at her daughter. Face flaccidly symmetric. Motor/Sensory: Moves all 4 extremities to noxious, 2-3/5 upper extremities and 2/5 lower extremities.    Lab Results: Results for orders placed or performed during the hospital encounter of 01/01/18 (from the past 48 hour(s))  Strep pneumoniae urinary antigen     Status: None   Collection Time: 12/31/17  5:24 PM  Result Value Ref Range   Strep Pneumo Urinary Antigen NEGATIVE NEGATIVE    Comment:        Infection due to S. pneumoniae cannot be absolutely ruled out since the antigen present may be below the detection limit of the test. Performed at [facility], [place] [address], [place], [place] [address]    CBG monitoring, ED     Status: Abnormal   Collection Time: 01/01/18  5:12 AM  Result Value Ref Range   Glucose-Capillary 527 (HH) 70 - 99 mg/dL  CBC with Differential     Status: Abnormal   Collection Time: 01/01/18  5:15 AM  Result Value Ref Range   WBC 14.7 (H) 4.0 - 10.5 K/uL   RBC 4.85 3.87 - 5.11 MIL/uL   Hemoglobin 12.8 12.0 - 15.0 g/dL   HCT 42.2 36.0 - 46.0 %   MCV 87.0 80.0 - 100.0 fL   MCH 26.4 26.0 - 34.0 pg   MCHC 30.3 30.0 - 36.0 g/dL   RDW 15.8 (H) 11.5 - 15.5 %   Platelets 386 150 - 400 K/uL   nRBC 0.0 0.0 - 0.2 %   Neutrophils Relative % 44 %   Neutro Abs 6.4 1.7 - 7.7 K/uL   Lymphocytes Relative 43 %   Lymphs Abs 6.6 (H) 0.7 - 4.0 K/uL   Monocytes Relative 10 %   Monocytes Absolute 1.4 (H) 0.1 - 1.0 K/uL   Eosinophils Relative 1 %   Eosinophils Absolute 0.2 0.0 - 0.5 K/uL   Basophils Relative 1 %   Basophils Absolute 0.1 0.0 - 0.1 K/uL   Immature Granulocytes 1 %   Abs Immature Granulocytes 0.09 (H) 0.00 - 0.07 K/uL    Comment:  Performed at [facility], [place] [address] [place], [place] [address]  Comprehensive metabolic panel     Status: Abnormal   Collection Time: 01/01/18  5:15 AM  Result Value Ref Range   Sodium 133 (L) 135 - 145 mmol/L   Potassium 4.1 3.5 - 5.1 mmol/L    Comment: DELTA CHECK NOTED   Chloride 98 98 - 111 mmol/L   CO2 11 (L) 22 - 32 mmol/L   Glucose, Bld 578 (HH) 70 - 99 mg/dL    Comment: CRITICAL RESULT CALLED TO, READ BACK BY AND VERIFIED WITH: ASHLEY,A RN 01/01/2018 0611 JORDANS    BUN 31 (H) 8 - 23 mg/dL   Creatinine, Ser 1.95 (H) 0.44 - 1.00 mg/dL   Calcium 9.8 8.9 - 10.3 mg/dL   Total Protein 7.9 6.5 - 8.1 g/dL   Albumin 3.8 3.5 - 5.0 g/dL   AST 25 15 - 41 U/L   ALT 17 0 - 44 U/L   Alkaline Phosphatase 92 38 - 126 U/L   Total Bilirubin 0.5 0.3 - 1.2 mg/dL   GFR calc non Af Amer 23 (L) >60 mL/min   GFR calc Af Amer 27 (L) >60 mL/min   Anion gap 24 (H) 5 - 15    Comment: RESULT CHECKED Performed at [facility], [place] [address] [place], [place] [address]   CK     Status: None   Collection Time: 01/01/18  5:15 AM  Result Value Ref Range   Total CK 59 38 - 234 U/L    Comment: Performed at [facility], [place] [address], [place], [place] [address]  Magnesium     Status: None   Collection Time: 01/01/18  5:17 AM  Result Value Ref Range   Magnesium 2.2 1.7 - 2.4 mg/dL    Comment: Performed at [facility] [address], [place], [place] [address]  Phosphorus     Status: None   Collection Time: 01/01/18  5:17 AM  Result Value Ref Range   Phosphorus 4.3 2.5 - 4.6 mg/dL    Comment: Performed at [facility] [address] [place], [place] [address]  I-stat troponin, ED     Status: None   Collection Time: 01/01/18  5:18 AM  Result Value Ref Range   Troponin i, poc 0.02 0.00 - 0.08 ng/mL   Comment 3            Comment: Due to the release kinetics of cTnI, a negative result within the first hours of the onset of symptoms does not rule out myocardial infarction with certainty. If myocardial infarction is still suspected, repeat the test at appropriate intervals.   I-stat chem 8, ed     Status: Abnormal   Collection Time: 01/01/18  5:20 AM  Result Value Ref Range   Sodium 132 (L) 135 - 145 mmol/L   Potassium 4.1 3.5 - 5.1 mmol/L   Chloride 103 98 - 111 mmol/L   BUN 32 (H) 8 - 23 mg/dL   Creatinine, Ser 1.50 (H) 0.44 - 1.00 mg/dL   Glucose, Bld 596 (HH) 70 - 99 mg/dL   Calcium, Ion 1.19 1.15 - 1.40 mmol/L   TCO2 14 (L) 22 - 32 mmol/L   Hemoglobin 15.0 12.0 - 15.0 g/dL   HCT 44.0 36.0 - 46.0 %   Comment NOTIFIED PHYSICIAN   I-Stat CG4 Lactic Acid, ED     Status: Abnormal   Collection Time: 01/01/18  5:20 AM  Result Value Ref Range   Lactic Acid, Venous 13.28 (HH) 0.5 - 1.9 mmol/L   Comment NOTIFIED PHYSICIAN   CBG monitoring, ED     Status: Abnormal   Collection Time: 01/01/18  7:27 AM  Result Value Ref Range   Glucose-Capillary 468 (H) 70 - 99 mg/dL    Comment 1 Notify RN    Comment 2 Document in Chart   I-Stat arterial blood gas, ED     Status: Abnormal   Collection Time: 01/01/18  8:08 AM  Result Value Ref Range   pH, Arterial 7.317 (L) 7.350 - 7.450   pCO2 arterial 43.1 32.0 - 48.0 mmHg   pO2, Arterial 146.0 (H) 83.0 - 108.0 mmHg   Bicarbonate 22.0 20.0 - 28.0 mmol/L   TCO2 23 22 - 32 mmol/L   O2 Saturation 99.0 %   Acid-base deficit 4.0 (H) 0.0 - 2.0 mmol/L   Patient temperature HIDE    Collection site RADIAL, ALLEN'S TEST ACCEPTABLE    Drawn by RT    Sample type ARTERIAL   Culture, blood (Routine X 2) w Reflex to ID Panel     Status: None (Preliminary result)   Collection Time: 01/01/18  8:45 AM  Result Value Ref Range   Specimen Description BLOOD LEFT ANTECUBITAL    Special Requests      BOTTLES DRAWN AEROBIC AND ANAEROBIC Blood Culture adequate volume   Culture      NO GROWTH < 24 HOURS Performed at [facility], [address], [place], [place] [address]    Report Status PENDING   Triglycerides     Status: Abnormal   Collection Time: 01/01/18  8:53 AM  Result Value Ref Range   Triglycerides 171 (H) <150 mg/dL    Comment: Performed at [facility] [address], [place], [place] [address]  Basic metabolic panel     Status: Abnormal   Collection Time: 01/01/18  8:53 AM  Result Value Ref Range   Sodium 137 135 - 145 mmol/L   Potassium 4.5 3.5 - 5.1 mmol/L   Chloride 103 98 - 111 mmol/L   CO2 19 (L) 22 - 32 mmol/L   Glucose, Bld 303 (H) 70 - 99 mg/dL   BUN 27 (H) 8 - 23 mg/dL   Creatinine, Ser 1.49 (H) 0.44 - 1.00 mg/dL   Calcium 9.5 8.9 - 10.3 mg/dL   GFR calc non Af Amer 32 (L) >60 mL/min   GFR calc Af Amer 37 (L) >60 mL/min   Anion gap 15 5 - 15    Comment: Performed at [facility] [address] [place], [place] [address]  Hemoglobin A1c     Status: Abnormal   Collection Time: 01/01/18  8:53 AM  Result Value Ref Range   Hgb A1c MFr Bld 13.0 (H) 4.8 - 5.6 %    Comment: (NOTE) Pre  diabetes:          5.7%-6.4% Diabetes:              >6.4% Glycemic control for   <7.0% adults with diabetes    Mean Plasma Glucose 326.4 mg/dL    Comment: Performed at [facility] [address], [place] [address]  CBC     Status: Abnormal   Collection Time: 01/01/18  8:53 AM  Result Value Ref Range   WBC 14.8 (H) 4.0 - 10.5 K/uL   RBC 4.92 3.87 - 5.11 MIL/uL  Hemoglobin 13.0 12.0 - 15.0 g/dL   HCT 40.9 36.0 - 46.0 %   MCV 83.1 80.0 - 100.0 fL   MCH 26.4 26.0 - 34.0 pg   MCHC 31.8 30.0 - 36.0 g/dL   RDW 15.5 11.5 - 15.5 %   Platelets 405 (H) 150 - 400 K/uL   nRBC 0.0 0.0 - 0.2 %    Comment: Performed at [facility] [address] [place], [place] [address]  Lactic acid, plasma     Status: Abnormal   Collection Time: 01/01/18  8:53 AM  Result Value Ref Range   Lactic Acid, Venous 2.8 (HH) 0.5 - 1.9 mmol/L    Comment: CRITICAL RESULT CALLED TO, READ BACK BY AND VERIFIED WITHWaynetta Pean RN [phone] 01/01/2018 BY A BENNETT Performed at [facility], [place] [address] [place], [place] [address]   Procalcitonin - Baseline     Status: None   Collection Time: 01/01/18  8:53 AM  Result Value Ref Range   Procalcitonin 0.28 ng/mL    Comment:        Interpretation: PCT (Procalcitonin) <= 0.5 ng/mL: Systemic infection (sepsis) is not likely. Local bacterial infection is possible. (NOTE)       Sepsis PCT Algorithm           Lower Respiratory Tract                                      Infection PCT Algorithm    ----------------------------     ----------------------------         PCT < 0.25 ng/mL                PCT < 0.10 ng/mL         Strongly encourage             Strongly discourage   discontinuation of antibiotics    initiation of antibiotics    ----------------------------     -----------------------------       PCT 0.25 - 0.50 ng/mL            PCT 0.10 - 0.25 ng/mL               OR       >80% decrease in PCT            Discourage initiation of                                             antibiotics      Encourage discontinuation           of antibiotics    ----------------------------     -----------------------------         PCT >= 0.50 ng/mL              PCT 0.26 - 0.50 ng/mL               AND        <80% decrease in PCT             Encourage initiation of  antibiotics       Encourage continuation           of antibiotics    ----------------------------     -----------------------------        PCT >= 0.50 ng/mL                  PCT > 0.50 ng/mL               AND         increase in PCT                  Strongly encourage                                      initiation of antibiotics    Strongly encourage escalation           of antibiotics                                     -----------------------------                                           PCT <= 0.25 ng/mL                                                 OR                                        > 80% decrease in PCT                                     Discontinue / Do not initiate                                             antibiotics Performed at [facility], [address], [place], [place] [address]   Culture, blood (Routine X 2) w Reflex to ID Panel     Status: None (Preliminary result)   Collection Time: 01/01/18  9:00 AM  Result Value Ref Range   Specimen Description BLOOD LEFT FOREARM    Special Requests      BOTTLES DRAWN AEROBIC AND ANAEROBIC Blood Culture adequate volume   Culture      NO GROWTH < 24 HOURS Performed at [facility], [place] [address] [place], [place] [address]    Report Status PENDING   Glucose, capillary     Status: Abnormal   Collection Time: 01/01/18  9:12 AM  Result Value Ref Range   Glucose-Capillary 334 (H) 70 - 99 mg/dL  Culture, respiratory (non-expectorated)     Status: None (Preliminary result)   Collection Time: 01/01/18  9:42 AM  Result Value Ref Range   Specimen  Description TRACHEAL ASPIRATE    Special Requests  NONE    Gram Stain      RARE WBC PRESENT, PREDOMINANTLY PMN ABUNDANT GRAM POSITIVE COCCI IN PAIRS IN CHAINS FEW GRAM POSITIVE RODS RARE GRAM NEGATIVE RODS    Culture      ABUNDANT Consistent with normal respiratory flora. Performed at [facility], [place] [address], [place], [place] [address]    Report Status PENDING   Glucose, capillary     Status: Abnormal   Collection Time: 01/01/18 10:43 AM  Result Value Ref Range   Glucose-Capillary 187 (H) 70 - 99 mg/dL  Glucose, capillary     Status: Abnormal   Collection Time: 01/01/18 11:52 AM  Result Value Ref Range   Glucose-Capillary 166 (H) 70 - 99 mg/dL  Basic metabolic panel     Status: Abnormal   Collection Time: 01/01/18  1:07 PM  Result Value Ref Range   Sodium 136 135 - 145 mmol/L   Potassium 5.0 3.5 - 5.1 mmol/L   Chloride 104 98 - 111 mmol/L   CO2 19 (L) 22 - 32 mmol/L   Glucose, Bld 206 (H) 70 - 99 mg/dL   BUN 25 (H) 8 - 23 mg/dL   Creatinine, Ser 1.25 (H) 0.44 - 1.00 mg/dL   Calcium 9.3 8.9 - 10.3 mg/dL   GFR calc non Af Amer 40 (L) >60 mL/min   GFR calc Af Amer 46 (L) >60 mL/min   Anion gap 13 5 - 15    Comment: Performed at [facility], [place] [address] [place], [place] [address]  Glucose, capillary     Status: Abnormal   Collection Time: 01/01/18  1:16 PM  Result Value Ref Range   Glucose-Capillary 188 (H) 70 - 99 mg/dL  Basic metabolic panel     Status: Abnormal   Collection Time: 01/01/18  2:40 PM  Result Value Ref Range   Sodium 136 135 - 145 mmol/L   Potassium 4.6 3.5 - 5.1 mmol/L   Chloride 106 98 - 111 mmol/L   CO2 20 (L) 22 - 32 mmol/L   Glucose, Bld 205 (H) 70 - 99 mg/dL   BUN 25 (H) 8 - 23 mg/dL   Creatinine, Ser 1.34 (H) 0.44 - 1.00 mg/dL   Calcium 9.2 8.9 - 10.3 mg/dL   GFR calc non Af Amer 37 (L) >60 mL/min   GFR calc Af Amer 42 (L) >60 mL/min   Anion gap 10 5 - 15    Comment: Performed at [facility], [place] [address],  [place], [place] [address]  Glucose, capillary     Status: Abnormal   Collection Time: 01/01/18  2:59 PM  Result Value Ref Range   Glucose-Capillary 186 (H) 70 - 99 mg/dL  Glucose, capillary     Status: Abnormal   Collection Time: 01/01/18  4:12 PM  Result Value Ref Range   Glucose-Capillary 156 (H) 70 - 99 mg/dL  Glucose, capillary     Status: Abnormal   Collection Time: 01/01/18  5:16 PM  Result Value Ref Range   Glucose-Capillary 160 (H) 70 - 99 mg/dL  Glucose, capillary     Status: Abnormal   Collection Time: 01/01/18  6:24 PM  Result Value Ref Range   Glucose-Capillary 113 (H) 70 - 99 mg/dL  Glucose, capillary     Status: Abnormal   Collection Time: 01/01/18  7:36 PM  Result Value Ref Range   Glucose-Capillary 141 (H) 70 - 99 mg/dL  Glucose, capillary     Status:  Abnormal   Collection Time: 01/01/18  8:43 PM  Result Value Ref Range   Glucose-Capillary 176 (H) 70 - 99 mg/dL  MRSA PCR Screening     Status: None   Collection Time: 01/01/18  9:09 PM  Result Value Ref Range   MRSA by PCR NEGATIVE NEGATIVE    Comment:        The GeneXpert MRSA Assay (FDA approved for NASAL specimens only), is one component of a comprehensive MRSA colonization surveillance program. It is not intended to diagnose MRSA infection nor to guide or monitor treatment for MRSA infections. Performed at [facility], [place] [address], [place], [place] [address]   Glucose, capillary     Status: Abnormal   Collection Time: 01/01/18  9:39 PM  Result Value Ref Range   Glucose-Capillary 187 (H) 70 - 99 mg/dL  Basic metabolic panel     Status: Abnormal   Collection Time: 01/01/18 10:45 PM  Result Value Ref Range   Sodium 135 135 - 145 mmol/L   Potassium 3.9 3.5 - 5.1 mmol/L   Chloride 104 98 - 111 mmol/L   CO2 21 (L) 22 - 32 mmol/L   Glucose, Bld 191 (H) 70 - 99 mg/dL   BUN 21 8 - 23 mg/dL   Creatinine, Ser 1.23 (H) 0.44 - 1.00 mg/dL   Calcium 8.9 8.9 - 10.3 mg/dL   GFR calc non Af Amer 41 (L) >60  mL/min   GFR calc Af Amer 47 (L) >60 mL/min   Anion gap 10 5 - 15    Comment: Performed at [facility], [place] [address] [place], [place] [address]  Glucose, capillary     Status: Abnormal   Collection Time: 01/01/18 10:49 PM  Result Value Ref Range   Glucose-Capillary 179 (H) 70 - 99 mg/dL  Glucose, capillary     Status: Abnormal   Collection Time: 01/01/18 11:51 PM  Result Value Ref Range   Glucose-Capillary 160 (H) 70 - 99 mg/dL  Glucose, capillary     Status: Abnormal   Collection Time: 01/02/18  1:05 AM  Result Value Ref Range   Glucose-Capillary 149 (H) 70 - 99 mg/dL  Glucose, capillary     Status: Abnormal   Collection Time: 01/02/18  4:39 AM  Result Value Ref Range   Glucose-Capillary 251 (H) 70 - 99 mg/dL  I-STAT 3, arterial blood gas (G3+)     Status: Abnormal   Collection Time: 01/02/18  4:52 AM  Result Value Ref Range   pH, Arterial 7.334 (L) 7.350 - 7.450   pCO2 arterial 38.2 32.0 - 48.0 mmHg   pO2, Arterial 147.0 (H) 83.0 - 108.0 mmHg   Bicarbonate 20.3 20.0 - 28.0 mmol/L   TCO2 21 (L) 22 - 32 mmol/L   O2 Saturation 99.0 %   Acid-base deficit 5.0 (H) 0.0 - 2.0 mmol/L   Patient temperature 98.8 F    Collection site RADIAL, ALLEN'S TEST ACCEPTABLE    Drawn by Operator    Sample type ARTERIAL   Basic metabolic panel     Status: Abnormal   Collection Time: 01/02/18  6:12 AM  Result Value Ref Range   Sodium 135 135 - 145 mmol/L   Potassium 3.9 3.5 - 5.1 mmol/L   Chloride 104 98 - 111 mmol/L   CO2 20 (L) 22 - 32 mmol/L   Glucose, Bld 291 (H) 70 - 99 mg/dL   BUN 19 8 - 23 mg/dL   Creatinine, Ser 1.23 (  H) 0.44 - 1.00 mg/dL   Calcium 8.7 (L) 8.9 - 10.3 mg/dL   GFR calc non Af Amer 41 (L) >60 mL/min   GFR calc Af Amer 47 (L) >60 mL/min   Anion gap 11 5 - 15    Comment: Performed at [facility] [address], [place], [place] [address]  Magnesium     Status: None   Collection Time: 01/02/18  6:12 AM  Result Value Ref Range   Magnesium 1.8 1.7 -  2.4 mg/dL    Comment: Performed at [facility] [address] [place], [place] [address]  Phosphorus     Status: None   Collection Time: 01/02/18  6:12 AM  Result Value Ref Range   Phosphorus 2.6 2.5 - 4.6 mg/dL    Comment: Performed at [facility] [address] [place], [place] [address]  CBC with Differential/Platelet     Status: Abnormal   Collection Time: 01/02/18  6:12 AM  Result Value Ref Range   WBC 13.4 (H) 4.0 - 10.5 K/uL   RBC 4.59 3.87 - 5.11 MIL/uL   Hemoglobin 12.1 12.0 - 15.0 g/dL   HCT 37.9 36.0 - 46.0 %   MCV 82.6 80.0 - 100.0 fL   MCH 26.4 26.0 - 34.0 pg   MCHC 31.9 30.0 - 36.0 g/dL   RDW 15.8 (H) 11.5 - 15.5 %   Platelets 370 150 - 400 K/uL   nRBC 0.0 0.0 - 0.2 %   Neutrophils Relative % 73 %   Neutro Abs 9.7 (H) 1.7 - 7.7 K/uL   Lymphocytes Relative 17 %   Lymphs Abs 2.3 0.7 - 4.0 K/uL   Monocytes Relative 10 %   Monocytes Absolute 1.3 (H) 0.1 - 1.0 K/uL   Eosinophils Relative 0 %   Eosinophils Absolute 0.0 0.0 - 0.5 K/uL   Basophils Relative 0 %   Basophils Absolute 0.0 0.0 - 0.1 K/uL   Immature Granulocytes 0 %   Abs Immature Granulocytes 0.06 0.00 - 0.07 K/uL    Comment: Performed at [facility], [place] [address] [place], [place] [address]  Procalcitonin     Status: None   Collection Time: 01/02/18  6:12 AM  Result Value Ref Range   Procalcitonin 0.72 ng/mL    Comment:        Interpretation: PCT > 0.5 ng/mL and <= 2 ng/mL: Systemic infection (sepsis) is possible, but other conditions are known to elevate PCT as well. (NOTE)       Sepsis PCT Algorithm           Lower Respiratory Tract                                      Infection PCT Algorithm    ----------------------------     ----------------------------         PCT < 0.25 ng/mL                PCT < 0.10 ng/mL         Strongly encourage             Strongly discourage   discontinuation of antibiotics    initiation of antibiotics    ----------------------------      -----------------------------       PCT 0.25 - 0.50 ng/mL  PCT 0.10 - 0.25 ng/mL               OR       >80% decrease in PCT            Discourage initiation of                                            antibiotics      Encourage discontinuation           of antibiotics    ----------------------------     -----------------------------         PCT >= 0.50 ng/mL              PCT 0.26 - 0.50 ng/mL                AND       <80% decrease in PCT             Encourage initiation of                                             antibiotics       Encourage continuation           of antibiotics    ----------------------------     -----------------------------        PCT >= 0.50 ng/mL                  PCT > 0.50 ng/mL               AND         increase in PCT                  Strongly encourage                                      initiation of antibiotics    Strongly encourage escalation           of antibiotics                                     -----------------------------                                           PCT <= 0.25 ng/mL                                                 OR                                        > 80% decrease in PCT  Discontinue / Do not initiate                                             antibiotics Performed at [facility], [place] [address], [place], [place] [address]   Lactic acid, plasma     Status: None   Collection Time: 01/02/18  6:12 AM  Result Value Ref Range   Lactic Acid, Venous 0.9 0.5 - 1.9 mmol/L    Comment: Performed at [facility] [address] [place], [place] [address]  Glucose, capillary     Status: Abnormal   Collection Time: 01/02/18  7:19 AM  Result Value Ref Range   Glucose-Capillary 265 (H) 70 - 99 mg/dL  Glucose, capillary     Status: Abnormal   Collection Time: 01/02/18 11:01 AM  Result Value Ref Range   Glucose-Capillary 211 (H) 70 - 99 mg/dL    Recent Results (from  the past 240 hour(s))  Culture, blood (Routine X 2) w Reflex to ID Panel     Status: None (Preliminary result)   Collection Time: 01/01/18  8:45 AM  Result Value Ref Range Status   Specimen Description BLOOD LEFT ANTECUBITAL  Final   Special Requests   Final    BOTTLES DRAWN AEROBIC AND ANAEROBIC Blood Culture adequate volume   Culture   Final    NO GROWTH < 24 HOURS Performed at [facility], [place] [address] [place], [place] [address]    Report Status PENDING  Incomplete  Culture, blood (Routine X 2) w Reflex to ID Panel     Status: None (Preliminary result)   Collection Time: 01/01/18  9:00 AM  Result Value Ref Range Status   Specimen Description BLOOD LEFT FOREARM  Final   Special Requests   Final    BOTTLES DRAWN AEROBIC AND ANAEROBIC Blood Culture adequate volume   Culture   Final    NO GROWTH < 24 HOURS Performed at [facility], [place] [address] [place], [place] [address]    Report Status PENDING  Incomplete  Culture, respiratory (non-expectorated)     Status: None (Preliminary result)   Collection Time: 01/01/18  9:42 AM  Result Value Ref Range Status   Specimen Description TRACHEAL ASPIRATE  Final   Special Requests NONE  Final   Gram Stain   Final    RARE WBC PRESENT, PREDOMINANTLY PMN ABUNDANT GRAM POSITIVE COCCI IN PAIRS IN CHAINS FEW GRAM POSITIVE RODS RARE GRAM NEGATIVE RODS    Culture   Final    ABUNDANT Consistent with normal respiratory flora. Performed at [facility], [place] [address] [place], [place] [address]    Report Status PENDING  Incomplete  MRSA PCR Screening     Status: None   Collection Time: 01/01/18  9:09 PM  Result Value Ref Range Status   MRSA by PCR NEGATIVE NEGATIVE Final    Comment:        The GeneXpert MRSA Assay (FDA approved for NASAL specimens only), is one component of a comprehensive MRSA colonization surveillance program. It is not intended to diagnose MRSA infection nor to guide or monitor treatment  for MRSA infections. Performed at [facility], [place] [address], [place], [place] [address]     Lipid Panel Recent Labs    01/01/18 0853  TRIG 171*  Studies/Results: Dg Abd 1 View  Result Date: 01/01/2018 CLINICAL DATA:  NG placement EXAM: ABDOMEN - 1 VIEW COMPARISON:  CT abdomen 07/25/2007 FINDINGS: NG tip in the gastric antrum. Distended large and small bowel loops. Moderate stool in the right colon. Mild stool in the rectosigmoid. IMPRESSION: NG tube in the gastric antrum Adynamic ileus with retained stool in the colon. Moderate stool in the right colon. Electronically Signed   By: Franchot Gallo M.D.   On: 01/01/2018 16:55   Ct Head Wo Contrast  Result Date: 01/01/2018 CLINICAL DATA:  84 year old female with head trauma. EXAM: CT HEAD WITHOUT CONTRAST CT CERVICAL SPINE WITHOUT CONTRAST TECHNIQUE: Multidetector CT imaging of the head and cervical spine was performed following the standard protocol without intravenous contrast. Multiplanar CT image reconstructions of the cervical spine were also generated. COMPARISON:  Head CT dated 12/31/2017 an MRI dated 12/31/2017 FINDINGS: CT HEAD FINDINGS Brain: There is moderate age-related atrophy and chronic microvascular ischemic changes. There is no acute intracranial hemorrhage. No mass effect or midline shift. No extra-axial fluid collection. Vascular: No hyperdense vessel or unexpected calcification. Skull: Normal. Negative for fracture or focal lesion. Sinuses/Orbits: Partial opacification of the right sphenoid sinus. The remainder of the visualized paranasal sinuses and mastoid air cells are clear. Other: An enteric tube is partially visualized. CT CERVICAL SPINE FINDINGS Alignment: No acute subluxation. Skull base and vertebrae: No acute fracture. Osteopenia. Soft tissues and spinal canal: No prevertebral fluid or swelling. No visible canal hematoma. Disc levels:  Degenerative changes primarily at C4-C6. Upper chest: Atherosclerotic  calcification of the aortic arch. Heterogeneous and mildly enlarged thyroid gland with multiple hypodense nodules. Further evaluation with ultrasound on a nonemergent basis recommended. Other: An endotracheal and an enteric tube are partially visualized. IMPRESSION: 1. No acute intracranial hemorrhage. 2. No acute/traumatic cervical spine pathology. Electronically Signed   By: Anner Crete M.D.   On: 01/01/2018 06:28   Ct Head Wo Contrast  Result Date: 12/31/2017 CLINICAL DATA:  Slurred speech EXAM: CT HEAD WITHOUT CONTRAST TECHNIQUE: Contiguous axial images were obtained from the base of the skull through the vertex without intravenous contrast. COMPARISON:  CT brain 09/16/2016, MRI 04/09/2016 FINDINGS: Brain: No acute territorial infarction, hemorrhage, or intracranial mass is visualized. Atrophy with moderate to marked small vessel ischemic changes of the white matter. Chronic lacunar infarcts within the basal ganglia and cerebellum. Stable ventricle size. Vascular: No hyperdense vessels.  Carotid vascular calcification. Skull: Normal. Negative for fracture or focal lesion. Sinuses/Orbits: No acute finding. Other: None IMPRESSION: 1. No CT evidence for acute intracranial abnormality. 2. Atrophy and small vessel ischemic changes of the white matter. Electronically Signed   By: Donavan Foil M.D.   On: 12/31/2017 18:35   Ct Cervical Spine Wo Contrast  Result Date: 01/01/2018 CLINICAL DATA:  84 year old female with head trauma. EXAM: CT HEAD WITHOUT CONTRAST CT CERVICAL SPINE WITHOUT CONTRAST TECHNIQUE: Multidetector CT imaging of the head and cervical spine was performed following the standard protocol without intravenous contrast. Multiplanar CT image reconstructions of the cervical spine were also generated. COMPARISON:  Head CT dated 12/31/2017 an MRI dated 12/31/2017 FINDINGS: CT HEAD FINDINGS Brain: There is moderate age-related atrophy and chronic microvascular ischemic changes. There is no  acute intracranial hemorrhage. No mass effect or midline shift. No extra-axial fluid collection. Vascular: No hyperdense vessel or unexpected calcification. Skull: Normal. Negative for fracture or focal lesion. Sinuses/Orbits: Partial opacification of the right sphenoid sinus. The remainder of the visualized paranasal sinuses and mastoid air cells  are clear. Other: An enteric tube is partially visualized. CT CERVICAL SPINE FINDINGS Alignment: No acute subluxation. Skull base and vertebrae: No acute fracture. Osteopenia. Soft tissues and spinal canal: No prevertebral fluid or swelling. No visible canal hematoma. Disc levels:  Degenerative changes primarily at C4-C6. Upper chest: Atherosclerotic calcification of the aortic arch. Heterogeneous and mildly enlarged thyroid gland with multiple hypodense nodules. Further evaluation with ultrasound on a nonemergent basis recommended. Other: An endotracheal and an enteric tube are partially visualized. IMPRESSION: 1. No acute intracranial hemorrhage. 2. No acute/traumatic cervical spine pathology. Electronically Signed   By: Anner Crete M.D.   On: 01/01/2018 06:28   Mr Brain Wo Contrast  Result Date: 12/31/2017 CLINICAL DATA:  Recurrent slurred speech this afternoon. History of hypertension and diabetes. EXAM: MRI HEAD WITHOUT CONTRAST TECHNIQUE: Multiplanar, multiecho pulse sequences of the brain and surrounding structures were obtained without intravenous contrast. COMPARISON:  CT HEAD December 31, 2017 FINDINGS: INTRACRANIAL CONTENTS: No reduced diffusion to suggest acute ischemia. No susceptibility artifact to suggest hemorrhage. Old bilateral cerebellar infarcts. Confluent supratentorial white matter FLAIR T2 hyperintensities. No midline shift, mass effect or masses. Scattered cerebellar and to lesser extent central cerebrum more chronic microhemorrhages. Prominent basal ganglia and thalami perivascular spaces associated with chronic hypertension. Moderate  parenchymal brain volume loss. No hydrocephalus. No abnormal extra-axial fluid collections. Small RIGHT parafalcine arachnoid cyst. VASCULAR: Normal major intracranial vascular flow voids present at skull base. SKULL AND UPPER CERVICAL SPINE: No abnormal sellar expansion. No suspicious calvarial bone marrow signal. Craniocervical junction maintained. SINUSES/ORBITS: The mastoid air-cells and included paranasal sinuses are well-aerated.The included ocular globes and orbital contents are non-suspicious. Status post RIGHT ocular lens implant. OTHER: LEFT nasal skin nodule.  Recommend direct inspection. IMPRESSION: 1. No acute intracranial process. 2. Multiple old cerebellar infarcts. 3. Moderate to severe chronic small vessel ischemic changes. Electronically Signed   By: Elon Alas M.D.   On: 12/31/2017 20:26   Dg Chest Port 1 View  Result Date: 01/02/2018 CLINICAL DATA:  Respiratory failure EXAM: PORTABLE CHEST 1 VIEW COMPARISON:  Yesterday FINDINGS: Endotracheal tube tip is 18 mm above the carina. An orogastric tube reaches the stomach. Cardiomegaly. Indistinct infrahilar opacities. No Kerley lines or pneumothorax. IMPRESSION: 1. Unremarkable hardware positioning. 2. Infrahilar opacities favoring atelectasis. Electronically Signed   By: Monte Fantasia M.D.   On: 01/02/2018 07:44   Dg Chest Port 1 View  Result Date: 01/01/2018 CLINICAL DATA:  84 y/o F; post intubation and enteric tube placement. EXAM: PORTABLE CHEST 1 VIEW COMPARISON:  01/01/2018 chest radiograph. FINDINGS: Stable normal cardiac silhouette given projection and technique. Aortic atherosclerosis with calcification. Enteric tube tip projects 2.1 cm above the carina. Enteric tube tip extends the proximal stomach, the proximal side hole is in the distal esophagus. Linear opacity at left lung base compatible with atelectasis. No pleural effusion or pneumothorax. No acute osseous abnormality identified. IMPRESSION: 1. Enteric tube tip 2.1  cm above carina. Endotracheal tube tip in proximal stomach, approximately 5 cm advancement recommended. 2. Left basilar atelectasis. Electronically Signed   By: Kristine Garbe M.D.   On: 01/01/2018 06:23   Dg Chest Port 1 View  Result Date: 01/01/2018 CLINICAL DATA:  84 year old female with altered mental status. EXAM: PORTABLE CHEST 1 VIEW COMPARISON:  Chest radiograph dated 07/25/2017 FINDINGS: Shallow inspiration. No focal consolidation, pleural effusion, or pneumothorax. Mild cardiomegaly. Atherosclerotic calcification of the aortic arch. No acute osseous pathology. Left upper quadrant surgical clips noted. There is gaseous distention of the visualized colon.  IMPRESSION: No acute cardiopulmonary process. Electronically Signed   By: Anner Crete M.D.   On: 01/01/2018 05:29    Medications:  Scheduled: . chlorhexidine gluconate (MEDLINE KIT)  15 mL Mouth Rinse BID  . cloNIDine  0.2 mg Per Tube BID  . diltiazem  60 mg Per Tube Q8H  . heparin  5,000 Units Subcutaneous Q8H  . insulin aspart  0-15 Units Subcutaneous Q4H  . insulin detemir  20 Units Subcutaneous BID  . mouth rinse  15 mL Mouth Rinse 10 times per day  . pantoprazole (PROTONIX) IV  40 mg Intravenous QHS   Continuous: . sodium chloride Stopped (01/01/18 1051)  . dextrose 5 % and 0.45% NaCl 75 mL/hr at 01/02/18 1300  . lactated ringers    . levETIRAcetam Stopped (01/02/18 1114)  . propofol (DIPRIVAN) infusion Stopped (01/02/18 1005)    Assessment: 84 year old female with new onset refractory seizures.  Most likely secondary to severe hyperglycemia.  Her blood pressures could suggest posterior reversible encephalopathy syndrome (PRES) but this was not present on her initial MRI. 1. Exam today with obtunded to somnolent state, while on propofol gtt at 7 mcg/kg/min. There is symmetric movement to various stimuli. She attempts to make contact between her mitted hand and the ET tube, consistent with volitional movement.  She gazes briefly at her daughter. LTM EEG shows diffuse slowing consistent with an encephalopathy, but no electrographic seizures.  2. On mechanical ventilation for airway protection.  3. On Keppra 500 mg BID  Recommendations: 1. Continue Keppra at 500 mg BID 2. Discontinue LTM 3. Obtaining repeat MRI brain today to rule out PRES.   4. Decrease sedation and extubate as soon as possible  40 minutes spent in the Neurological evaluation and management of this critically ill patient. Time spent included EEG review and coordination of care.    LOS: 1 day   _0  signed: Dr. Kerney Elbe 01/02/2018  1:36 PM

## 2018-01-02 NOTE — Progress Notes (Signed)
RN notified of phlebotomy having issues drawing labs throughout the night/morning. RN notified Warren Lacy. Orders received for labs to be drawn from feet until ground team can assess for CVC. Will continue to monitor closely. Clint Bolder, RN 01/02/18 6:15 AM

## 2018-01-02 NOTE — Progress Notes (Signed)
C-collar removed per order. Family at bedside and grateful for this. Patient resting comfortably in bed. Will continue to monitor.

## 2018-01-02 NOTE — Progress Notes (Signed)
Inpatient Diabetes Program Recommendations  AACE/ADA: New Consensus Statement on Inpatient Glycemic Control (2015)  Target Ranges:  Prepandial:   less than 140 mg/dL      Peak postprandial:   less than 180 mg/dL (1-2 hours)      Critically ill patients:  140 - 180 mg/dL   Lab Results  Component Value Date   GLUCAP 211 (H) 01/02/2018   HGBA1C 13.0 (H) 01/01/2018    Review of Glycemic ControlResults for MIRAY, MANCINO (MRN [id number]) as of 01/02/2018 13:12  Ref. Range 01/01/2018 23:51 01/02/2018 01:05 01/02/2018 04:39 01/02/2018 07:19 01/02/2018 11:01  Glucose-Capillary Latest Ref Range: 70 - 99 mg/dL 160 (H) 149 (H) 251 (H) 265 (H) 211 (H)   Diabetes history: DM 2 Outpatient Diabetes medications:  Metformin 500 mg daily, Levemir 25 units in the AM and 18 units in the PM Current orders for Inpatient glycemic control:  Levemir 20 units bid, Novolog moderate q 4 hours  Inpatient Diabetes Program Recommendations:    Note increased A1C.  Once patient extubated, will need to assess her ability to self-administer insulin at home. Blood sugars have not been well controlled prior to admit.  Will follow.   Thanks,  Adah Perl, RN, BC-ADM Inpatient Diabetes Coordinator Pager [phone] (8a-5p)

## 2018-01-02 NOTE — Progress Notes (Signed)
vLTM EEG d/c per neuro. No skin breakdown 

## 2018-01-02 NOTE — Progress Notes (Signed)
RT NOTES: Patient transported to MRI on vent without complication.

## 2018-01-02 NOTE — Progress Notes (Addendum)
Initial Nutrition Assessment  DOCUMENTATION CODES:   Obesity unspecified  INTERVENTION:   Tube Feeding:  Vital High Protein @ 50 ml/hr Provides 1200 kcals, 106 g of protein and 1008 mL of free water Meets 100% estimated calorie and protein needs  TF regimen and propofol at current rate providing 1329 total kcal/day   NUTRITION DIAGNOSIS:   Inadequate oral intake related to acute illness as evidenced by NPO status.  GOAL:   Patient will meet greater than or equal to 90% of their needs  MONITOR:   TF tolerance, Vent status, Labs, Weight trends  REASON FOR ASSESSMENT:   Ventilator    ASSESSMENT:   84 yo female admitted with status epilepticus requiring intubation. PMH includes DM, HTN, GERD, CVA, SBO   Patient is currently intubated on ventilator support MV: 11.1 L/min Temp (24hrs), Avg:99.1 F (37.3 C), Min:97.6 F (36.4 C), Max:100.3 F (37.9 C)  Propofol: 4.9 ml/hr  Family at bedside and indicates pt with good appetite PTA and has been eating well. Family reports pt has not experienced any weight loss. Family reports pt is independent, lives alone, still drives.   Labs: Creatinine 1.23, BUN wdl, CBGs 149-265 Meds: NS at 80 ml/hr  NUTRITION - FOCUSED PHYSICAL EXAM:    Most Recent Value  Orbital Region  No depletion  Upper Arm Region  No depletion  Thoracic and Lumbar Region  No depletion  Buccal Region  Unable to assess  Temple Region  No depletion  Clavicle Bone Region  No depletion  Clavicle and Acromion Bone Region  No depletion  Scapular Bone Region  No depletion  Dorsal Hand  Unable to assess  Patellar Region  Mild depletion  Anterior Thigh Region  Mild depletion  Posterior Calf Region  Mild depletion  Edema (RD Assessment)  None       Diet Order:   Diet Order            Diet NPO time specified  Diet effective now              EDUCATION NEEDS:   Not appropriate for education at this time  Skin:  Skin Assessment: Reviewed RN  Assessment  Last BM:  no documented BM  Height:   Ht Readings from Last 1 Encounters:  01/01/18 5\' 1"  (1.549 m)    Weight:   Wt Readings from Last 1 Encounters:  01/01/18 84.1 kg    Ideal Body Weight:  47.7 kg  BMI:  Body mass index is 35.03 kg/m.  Estimated Nutritional Needs:   Kcal:  1000-1200 kcals   Protein:  95-110 g   Fluid:  >/= 1.5 L   Kerman Passey MS, RD, LDN, CNSC [phone] Pager  [phone] Weekend/On-Call Pager

## 2018-01-02 NOTE — Procedures (Signed)
  Video EEG Monitoring Report     Dates of recording: 01/01/2018 @ 09:46 to 01/02/2018 @ 07:30   Recording day: 1    Interpreting physician: Izora Ribas, DO       CPT: [phone]              History: Continuous VEEG requested to evaluate for seizures.  EEG Details: Routine Video EEG was performed using standard setting per the guidelines of American Clinical Neurophysiology Society (ACNS). A minimum of 21 electrodes were placed on scalp according to the International 10-20 or 10-10 system. Supplemental electrodes were placed as needed. Single EKG electrode was also used to detect cardiac arrhythmia. Recording was performed at a sampling rate of at least 256 Hz. Patient's behavior was continuously recorded on video simultaneously with EEG. A minimum of 18 channels were used for data display. Each epoch of study was reviewed manually daily and as needed using standard digital review software allowing for montage reformatting, gain and filter changes on a display system of sufficient resolution to prevent aliasing. Computerized quantitative EEG analysis (such as compressed spectral array analysis, dipole analysis, trending, automated spike & seizure detection) was used as indicated.  Description of EEG features: State of patient: Stupor Dominant activity: Theta and delta waves with overriding beta  Reactivity to stimulation: No EEG change Sleep: Normal sleep was not recorded  Nonepileptiform abnormalities: Continuous slow generalized interspersed by periods of background attenuation lasting up to 6 seconds  Epileptiform discharges: None Paroxysmal events or seizures: None Push button events: None  Impression: This EEG shows evidence of a severe diffuse encephalopathy. No epileptiform discharges or EEG seizures were recorded.

## 2018-01-02 NOTE — Progress Notes (Signed)
NAME:  [name], MRN:  [id number], DOB:  [birth year], LOS: 1 ADMISSION DATE:  01/01/2018, CONSULTATION DATE:  12/12 REFERRING MD:  Dr. Kathrynn Humble, CHIEF COMPLAINT:  Seizure   Brief History   84 year old female with past medical history of diabetes and hypertension admitted 12/12 for status epilepticus requiring intubation.  History of present illness   84 year old female with past medical history as below, which is significant for diabetes, hypertension, stroke, and small bowel obstruction.  Symptoms started 12/11 when she developed slurred speech while at the bank.  She had several episodes of this causing her to present to the emergency department in the evening hours of 12/11.  Work-up including CT scan and MRI were negative and she was ultimately discharged home.  Then in the early morning hours of 12/12 she was found to be unresponsive by her grandson.  EMS was called and in route to the emergency department she experienced seizure-like activity.  She had another witnessed seizure in the emergency department and was intubated for airway protection.  Repeat CT of the head was nonacute.  Neurology was consulted in the emergency department and has made recommendations for antiepileptic medications and started propofol infusion.  Past Medical History   has a past medical history of ANEMIA, IRON DEFICIENCY (08/11/2008), ANGIOEDEMA (03/01/2008), CEREBROVASCULAR ACCIDENT, HX OF (03/01/2008), CONTACT DERMATITIS&OTHER ECZEMA DUE UNSPEC CAUSE (11/28/2008), DIABETES MELLITUS (03/01/2008), Dysuria (01/04/2010), GERD (03/01/2008), HYPERSOMNIA (05/30/2009), HYPERTENSION (03/01/2008), INTERNAL HEMORRHOIDS (03/01/2008), KNEE PAIN, RIGHT (09/05/2009), NONSPECIFIC ABN FINDNG RAD&OTH EXAM BILARY TRCT (03/07/2008), OBESITY (01/10/2010), PERIPHERAL EDEMA (08/10/2008), POLYP, GALLBLADDER (03/07/2008), SMALL BOWEL OBSTRUCTION, HX OF (03/01/2008), SNORING (05/11/2009), Stroke ([place]) (04/08/2016), and THROMBOCYTOSIS  (08/18/2008).   Significant Hospital Events   12/11 to ED for slurred speech/TIA. Discharged 12/12 to ED found down. Seizures. Intubated   Consults:  Neurology  Procedures:  ETT 12/12>>  Significant Diagnostic Tests:  CT head 12/11 > No CT evidence for acute intracranial abnormality. Atrophy and small vessel ischemic changes of the white matter. MRI brain 12/11 > No acute intracranial process. Multiple old cerebellar infarcts. Moderate to severe chronic small vessel ischemic changes. CT head Cspine 12/12 > No acute intracranial hemorrhage. No acute/traumatic cervical spine pathology. 01/02/2018 MRI of the head plan>> 01/01/2018 continuous EEG>> Micro Data:  01/01/2018 new sputum culture>> consistent with normal flora but still pending>> 01/01/2018 blood cultures x2>> Antimicrobials:  01/02/2018 none  Interim history/subjective:  Plan for MRI today if unremarkable consider weaning to extubation Resume home clonidine and diltiazem keep blood pressure down IV fluids decreased to 75 cc an hour with hypertension and elevated glucose Anion gap is closed  Objective   Blood pressure (!) 171/71, pulse 82, temperature 98.9 F (37.2 C), temperature source Axillary, resp. rate 18, height 5\' 1"  (1.549 m), weight 84.1 kg, SpO2 100 %.    Vent Mode: PRVC FiO2 (%):  [40 %] 40 % Set Rate:  [18 bmp] 18 bmp Vt Set:  [380 mL] 380 mL PEEP:  [5 cmH20] 5 cmH20 Plateau Pressure:  [5 cmH20-13 cmH20] 13 cmH20   Intake/Output Summary (Last 24 hours) at 01/02/2018 0834 Last data filed at 01/02/2018 0600 Gross per 24 hour  Intake 2684.87 ml  Output 2142 ml  Net 542.87 ml   Filed Weights   01/01/18 1501  Weight: 84.1 kg    Examination: General: Morbidly obese female who sedated with propofol. HEENT: Nipples equal reactive at 4 mm.  Sedated with propofol at this time.  Endotracheal tube connected to ventilator, orogastric tube in place Neuro:  Moves all extremities x4 to noxious stimuli CV:  Sounds regular regular rate and rhythm currently in a sinus rhythm PULM: Decreased breath sounds in the bases KD:TOIZ, non-tender, bsx4 active, no tube feedings at this time  extremities: warm/dry, 1+ edema  Skin: no rashes or lesions    Resolved Hospital Problem list     Assessment & Plan:   Status epilepticus: No seizure history. She is hypertensive, which raises concern for PRES. She is also hyperglycemic with glucose nearing 600. Both of which could potentially cause seizure.  Note MRI on 1211 did not support pres diagnosis. -MRI planned for 01/02/2018 -Currently on a propofol drip will wean to extubate once MRI is complete and if it is unremarkable  History of CVA on Xarelto -01/02/2018 anticoagulation is on hold until MRI is complete. -Plan for MRI of the head 01/02/2018   Inability to protect airway in the setting of above -Continue full vent support for now.  She is scheduled for MRI.  He was intubated to go to CT scan originally.  Therefore we should be able to extubate her hopefully today 01/02/2018.  Hyperglycemia: with history of DM -Hypoglycemia protocol in place just transition to subcutaneous Levemir 20 units twice daily -Follow the protocol  Hypertensive urgency: possibility of PRES. Not described on imaging, including MRI from 12/11. -ICU monitor -PRN labetalol with goal systolic blood pressure 124 -Continue to monitor blood pressure may need further interventions -Decrease IV fluids to 75 cc an hour -01/02/2018 resume home clonidine 0.2 mg and diltiazem 60 mg 3 times daily both via tube  Metabolic acidosis with elevated anion gap: explained by lactic acid 13.  -Resolved as of 01/02/2018  Best practice:  Diet: NPO Pain/Anxiety/Delirium protocol (if indicated): propofol for RASS -1 and seizure cessation VAP protocol (if indicated): Yes DVT prophylaxis: sq heparin GI prophylaxis: protonix Glucose control: SSI and IVF Mobility: BR Code Status: FULL Family  Communication: 01/02/2018 daughter updated at bedside. Disposition: ICU, critically ill.   Labs   CBC: Recent Labs  Lab 12/31/17 1724 12/31/17 1813 01/01/18 0515 01/01/18 0520 01/01/18 0853 01/02/18 0612  WBC 9.2  --  14.7*  --  14.8* 13.4*  NEUTROABS 3.6  --  6.4  --   --  9.7*  HGB 12.4 14.3 12.8 15.0 13.0 12.1  HCT 39.3 42.0 42.2 44.0 40.9 37.9  MCV 83.6  --  87.0  --  83.1 82.6  PLT 359  --  386  --  405* 580    Basic Metabolic Panel: Recent Labs  Lab 01/01/18 0517  01/01/18 0853 01/01/18 1307 01/01/18 1440 01/01/18 2245 01/02/18 0612  NA  --    < > 137 136 136 135 135  K  --    < > 4.5 5.0 4.6 3.9 3.9  CL  --    < > 103 104 106 104 104  CO2  --   --  19* 19* 20* 21* 20*  GLUCOSE  --    < > 303* 206* 205* 191* 291*  BUN  --    < > 27* 25* 25* 21 19  CREATININE  --    < > 1.49* 1.25* 1.34* 1.23* 1.23*  CALCIUM  --   --  9.5 9.3 9.2 8.9 8.7*  MG 2.2  --   --   --   --   --  1.8  PHOS 4.3  --   --   --   --   --  2.6   < > =  values in this interval not displayed.   GFR: Estimated Creatinine Clearance: 34.1 mL/min (A) (by C-G formula based on SCr of 1.23 mg/dL (H)). Recent Labs  Lab 12/31/17 1724 01/01/18 0515 01/01/18 0520 01/01/18 0853 01/02/18 0612  PROCALCITON  --   --   --  0.28 0.72  WBC 9.2 14.7*  --  14.8* 13.4*  LATICACIDVEN  --   --  13.28* 2.8* 0.9    Liver Function Tests: Recent Labs  Lab 12/31/17 1724 01/01/18 0515  AST 16 25  ALT 17 17  ALKPHOS 94 92  BILITOT 0.4 0.5  PROT 7.7 7.9  ALBUMIN 3.5 3.8   No results for input(s): LIPASE, AMYLASE in the last 168 hours. No results for input(s): AMMONIA in the last 168 hours.  ABG    Component Value Date/Time   PHART 7.334 (L) 01/02/2018 0452   PCO2ART 38.2 01/02/2018 0452   PO2ART 147.0 (H) 01/02/2018 0452   HCO3 20.3 01/02/2018 0452   TCO2 21 (L) 01/02/2018 0452   ACIDBASEDEF 5.0 (H) 01/02/2018 0452   O2SAT 99.0 01/02/2018 0452     Coagulation Profile: Recent Labs  Lab  12/31/17 1724  INR 1.18    Cardiac Enzymes: Recent Labs  Lab 01/01/18 0515  CKTOTAL 59    HbA1C: Hgb A1c MFr Bld  Date/Time Value Ref Range Status  01/01/2018 08:53 AM 13.0 (H) 4.8 - 5.6 % Final    Comment:    (NOTE) Pre diabetes:          5.7%-6.4% Diabetes:              >6.4% Glycemic control for   <7.0% adults with diabetes   07/26/2017 12:03 AM 10.6 (H) 4.8 - 5.6 % Final    Comment:    (NOTE) Pre diabetes:          5.7%-6.4% Diabetes:              >6.4% Glycemic control for   <7.0% adults with diabetes     CBG: Recent Labs  Lab 01/01/18 2249 01/01/18 2351 01/02/18 0105 01/02/18 0439 01/02/18 0719  GLUCAP 179* 160* 149* 251* 265*      Critical care time: 35 min     Richardson Landry Jessiah Wojnar ACNP Maryanna Shape PCCM Pager [phone] till 1 pm If no answer page [phone] 01/02/2018, 8:34 AM

## 2018-01-03 ENCOUNTER — Encounter (HOSPITAL_COMMUNITY): Payer: Self-pay

## 2018-01-03 ENCOUNTER — Inpatient Hospital Stay (HOSPITAL_COMMUNITY): Payer: Medicare HMO

## 2018-01-03 DIAGNOSIS — J9601 Acute respiratory failure with hypoxia: Secondary | ICD-10-CM

## 2018-01-03 LAB — CBC WITH DIFFERENTIAL/PLATELET
Abs Immature Granulocytes: 0.06 10*3/uL (ref 0.00–0.07)
Basophils Absolute: 0.1 10*3/uL (ref 0.0–0.1)
Basophils Relative: 0 %
Eosinophils Absolute: 0.2 10*3/uL (ref 0.0–0.5)
Eosinophils Relative: 1 %
HCT: 36.2 % (ref 36.0–46.0)
Hemoglobin: 11.4 g/dL — ABNORMAL LOW (ref 12.0–15.0)
Immature Granulocytes: 0 %
Lymphocytes Relative: 22 %
Lymphs Abs: 3 10*3/uL (ref 0.7–4.0)
MCH: 26.6 pg (ref 26.0–34.0)
MCHC: 31.5 g/dL (ref 30.0–36.0)
MCV: 84.6 fL (ref 80.0–100.0)
Monocytes Absolute: 1.8 10*3/uL — ABNORMAL HIGH (ref 0.1–1.0)
Monocytes Relative: 13 %
Neutro Abs: 8.9 10*3/uL — ABNORMAL HIGH (ref 1.7–7.7)
Neutrophils Relative %: 64 %
Platelets: 320 10*3/uL (ref 150–400)
RBC: 4.28 MIL/uL (ref 3.87–5.11)
RDW: 16.3 % — ABNORMAL HIGH (ref 11.5–15.5)
WBC: 14 10*3/uL — AB (ref 4.0–10.5)
nRBC: 0 % (ref 0.0–0.2)

## 2018-01-03 LAB — BASIC METABOLIC PANEL
Anion gap: 10 (ref 5–15)
BUN: 20 mg/dL (ref 8–23)
CO2: 21 mmol/L — ABNORMAL LOW (ref 22–32)
Calcium: 8.5 mg/dL — ABNORMAL LOW (ref 8.9–10.3)
Chloride: 106 mmol/L (ref 98–111)
Creatinine, Ser: 1.28 mg/dL — ABNORMAL HIGH (ref 0.44–1.00)
GFR calc Af Amer: 45 mL/min — ABNORMAL LOW (ref >60)
GFR calc non Af Amer: 39 mL/min — ABNORMAL LOW (ref >60)
Glucose, Bld: 302 mg/dL — ABNORMAL HIGH (ref 70–99)
POTASSIUM: 4.3 mmol/L (ref 3.5–5.1)
Sodium: 137 mmol/L (ref 135–145)

## 2018-01-03 LAB — POCT I-STAT 3, ART BLOOD GAS (G3+)
Acid-base deficit: 2 mmol/L (ref 0.0–2.0)
BICARBONATE: 23 mmol/L (ref 20.0–28.0)
O2 Saturation: 98 %
Patient temperature: 98.9
TCO2: 24 mmol/L (ref 22–32)
pCO2 arterial: 40.5 mmHg (ref 32.0–48.0)
pH, Arterial: 7.363 (ref 7.350–7.450)
pO2, Arterial: 104 mmHg (ref 83.0–108.0)

## 2018-01-03 LAB — GLUCOSE, CAPILLARY
GLUCOSE-CAPILLARY: 179 mg/dL — AB (ref 70–99)
Glucose-Capillary: 245 mg/dL — ABNORMAL HIGH (ref 70–99)
Glucose-Capillary: 301 mg/dL — ABNORMAL HIGH (ref 70–99)
Glucose-Capillary: 190 mg/dL — ABNORMAL HIGH (ref 70–99)
Glucose-Capillary: 236 mg/dL — ABNORMAL HIGH (ref 70–99)
Glucose-Capillary: 207 mg/dL — ABNORMAL HIGH (ref 70–99)

## 2018-01-03 LAB — PHOSPHORUS: Phosphorus: 3.2 mg/dL (ref 2.5–4.6)

## 2018-01-03 LAB — MAGNESIUM: Magnesium: 1.9 mg/dL (ref 1.7–2.4)

## 2018-01-03 LAB — CULTURE, RESPIRATORY W GRAM STAIN: Culture: NORMAL

## 2018-01-03 LAB — PROCALCITONIN: Procalcitonin: 0.47 ng/mL

## 2018-01-03 MED ORDER — LEVETIRACETAM 100 MG/ML PO SOLN
500.0000 mg | Freq: Two times a day (BID) | ORAL | Status: DC
  Administered 2018-01-03 – 2018-01-05 (×4): 500 mg
  Filled 2018-01-03 (×4): qty 5

## 2018-01-03 MED ORDER — LEVETIRACETAM 100 MG/ML PO SOLN
500.0000 mg | Freq: Two times a day (BID) | ORAL | Status: DC
  Filled 2018-01-03: qty 5

## 2018-01-03 NOTE — Progress Notes (Signed)
NAME:  [name], MRN:  [id number], DOB:  Jul 05, 1932, LOS: 2 ADMISSION DATE:  01/01/2018, CONSULTATION DATE:  12/12 REFERRING MD:  Dr. Kathrynn Humble, CHIEF COMPLAINT:  Seizure   Brief History   84 year old female with past medical history of diabetes and hypertension admitted 12/12 for status epilepticus requiring intubation.  History of present illness   84 year old female with past medical history as below, which is significant for diabetes, hypertension, stroke, and small bowel obstruction.  Symptoms started 12/11 when she developed slurred speech while at the bank.  She had several episodes of this causing her to present to the emergency department in the evening hours of 12/11.  Work-up including CT scan and MRI were negative and she was ultimately discharged home.  Then in the early morning hours of 12/12 she was found to be unresponsive by her grandson.  EMS was called and in route to the emergency department she experienced seizure-like activity.  She had another witnessed seizure in the emergency department and was intubated for airway protection.  Repeat CT of the head was nonacute.  Neurology was consulted in the emergency department and has made recommendations for antiepileptic medications and started propofol infusion.  Past Medical History   has a past medical history of ANEMIA, IRON DEFICIENCY (08/11/2008), ANGIOEDEMA (03/01/2008), CEREBROVASCULAR ACCIDENT, HX OF (03/01/2008), CONTACT DERMATITIS&OTHER ECZEMA DUE UNSPEC CAUSE (11/28/2008), DIABETES MELLITUS (03/01/2008), Dysuria (01/04/2010), GERD (03/01/2008), HYPERSOMNIA (05/30/2009), HYPERTENSION (03/01/2008), INTERNAL HEMORRHOIDS (03/01/2008), KNEE PAIN, RIGHT (09/05/2009), NONSPECIFIC ABN FINDNG RAD&OTH EXAM BILARY TRCT (03/07/2008), OBESITY (01/10/2010), PERIPHERAL EDEMA (08/10/2008), POLYP, GALLBLADDER (03/07/2008), SMALL BOWEL OBSTRUCTION, HX OF (03/01/2008), SNORING (05/11/2009), Stroke ([place]) (04/08/2016), and THROMBOCYTOSIS  (08/18/2008).   Significant Hospital Events   12/11 to ED for slurred speech/TIA. Discharged 12/12 to ED found down. Seizures. Intubated   Consults:  Neurology  Procedures:  ETT 12/12>>  Significant Diagnostic Tests:  CT head 12/11 > No CT evidence for acute intracranial abnormality. Atrophy and small vessel ischemic changes of the white matter. MRI brain 12/11 > No acute intracranial process. Multiple old cerebellar infarcts. Moderate to severe chronic small vessel ischemic changes. CT head Cspine 12/13 > No acute intracranial hemorrhage. No acute/traumatic cervical spine pathology. 01/03/2018 MRI of the head plan>> no features to suggest PRES 01/01/2018 continuous EEG>> no evidence of seizure activity Micro Data:  01/01/2018 new sputum culture>> consistent with normal flora but still pending>> 01/01/2018 blood cultures x2>> Antimicrobials:  01/02/2018 none  Interim history/subjective:  Cervical spine cleared No further seizure activity.  EEG results as above Tolerating some pressure support ventilation  Objective   Blood pressure 136/68, pulse 76, temperature 98.6 F (37 C), temperature source Axillary, resp. rate 20, height 5\' 1"  (1.549 m), weight 84.3 kg, SpO2 100 %.    Vent Mode: PRVC FiO2 (%):  [30 %-40 %] 30 % Set Rate:  [18 bmp] 18 bmp Vt Set:  [380 mL] 380 mL PEEP:  [5 cmH20] 5 cmH20 Plateau Pressure:  [8 cmH20-14 cmH20] 10 cmH20   Intake/Output Summary (Last 24 hours) at 01/03/2018 1425 Last data filed at 01/03/2018 1200 Gross per 24 hour  Intake 2356.08 ml  Output 775 ml  Net 1581.08 ml   Filed Weights   01/01/18 1501 01/03/18 0336  Weight: 84.1 kg 84.3 kg    Examination: General: Obese woman, intubated and sedated HEENT: ET tube in place, pupils equal  Neuro: Sedated, grimace with stimulation, tries to open eyes, not following commands CV: Regular, no murmur PULM: Decreased breath sounds bilaterally, no wheezing or  crackles GI: Soft, nondistended,  positive bowel sounds extremities: trace lower extremity edema Skin: No rash    Resolved Hospital Problem list     Assessment & Plan:   Status epilepticus: No seizure history.  No evidence of press on repeat MRI 12/13 Continue tight blood pressure control Continue Keppra as ordered Start to lighten propofol, goal wake up  History of CVA on Xarelto Start aspirin, Xarelto when okay with neurology  Inability to protect airway in the setting of above Lighten sedation and work on spontaneous breathing trial, goal extubation in the near future  Hyperglycemia: with history of DM Levemir 20 twice daily Sliding scale insulin per protocol  Hypertensive urgency: possibility of PRES. Not described on imaging, including MRI from 12/11 or 12/13 Telemetry Continue clonidine, diltiazem scheduled, labetalol as needed KVO IV fluids  Metabolic acidosis with elevated anion gap: explained by lactic acid 13.  Resolved Follow BMP  Best practice:  Diet: NPO Pain/Anxiety/Delirium protocol (if indicated): propofol for RASS -1 and seizure cessation VAP protocol (if indicated): Yes DVT prophylaxis: sq heparin GI prophylaxis: protonix Glucose control: SSI and IVF Mobility: BR Code Status: FULL Family Communication: Updated family 12/14 at bedside Disposition: ICU, critically ill.   Labs   CBC: Recent Labs  Lab 12/31/17 1724  01/01/18 0515 01/01/18 0520 01/01/18 0853 01/02/18 0612 01/03/18 0651  WBC 9.2  --  14.7*  --  14.8* 13.4* 14.0*  NEUTROABS 3.6  --  6.4  --   --  9.7* 8.9*  HGB 12.4   < > 12.8 15.0 13.0 12.1 11.4*  HCT 39.3   < > 42.2 44.0 40.9 37.9 36.2  MCV 83.6  --  87.0  --  83.1 82.6 84.6  PLT 359  --  386  --  405* 370 320   < > = values in this interval not displayed.    Basic Metabolic Panel: Recent Labs  Lab 01/01/18 0517  01/01/18 1307 01/01/18 1440 01/01/18 2245 01/02/18 0612 01/02/18 1849 01/03/18 0651  NA  --    < > 136 136 135 135  --  137  K  --     < > 5.0 4.6 3.9 3.9  --  4.3  CL  --    < > 104 106 104 104  --  106  CO2  --    < > 19* 20* 21* 20*  --  21*  GLUCOSE  --    < > 206* 205* 191* 291*  --  302*  BUN  --    < > 25* 25* 21 19  --  20  CREATININE  --    < > 1.25* 1.34* 1.23* 1.23*  --  1.28*  CALCIUM  --    < > 9.3 9.2 8.9 8.7*  --  8.5*  MG 2.2  --   --   --   --  1.8 1.9 1.9  PHOS 4.3  --   --   --   --  2.6 2.6 3.2   < > = values in this interval not displayed.   GFR: Estimated Creatinine Clearance: 32.8 mL/min (A) (by C-G formula based on SCr of 1.28 mg/dL (H)). Recent Labs  Lab 01/01/18 0515 01/01/18 0520 01/01/18 0853 01/02/18 0612 01/03/18 0651  PROCALCITON  --   --  0.28 0.72 0.47  WBC 14.7*  --  14.8* 13.4* 14.0*  LATICACIDVEN  --  13.28* 2.8* 0.9  --     Liver Function Tests: Recent Labs  Lab 12/31/17 1724 01/01/18 0515  AST 16 25  ALT 17 17  ALKPHOS 94 92  BILITOT 0.4 0.5  PROT 7.7 7.9  ALBUMIN 3.5 3.8   No results for input(s): LIPASE, AMYLASE in the last 168 hours. No results for input(s): AMMONIA in the last 168 hours.  ABG    Component Value Date/Time   PHART 7.363 01/03/2018 0536   PCO2ART 40.5 01/03/2018 0536   PO2ART 104.0 01/03/2018 0536   HCO3 23.0 01/03/2018 0536   TCO2 24 01/03/2018 0536   ACIDBASEDEF 2.0 01/03/2018 0536   O2SAT 98.0 01/03/2018 0536     Coagulation Profile: Recent Labs  Lab 12/31/17 1724  INR 1.18    Cardiac Enzymes: Recent Labs  Lab 01/01/18 0515  CKTOTAL 59    HbA1C: Hgb A1c MFr Bld  Date/Time Value Ref Range Status  01/01/2018 08:53 AM 13.0 (H) 4.8 - 5.6 % Final    Comment:    (NOTE) Pre diabetes:          5.7%-6.4% Diabetes:              >6.4% Glycemic control for   <7.0% adults with diabetes   07/26/2017 12:03 AM 10.6 (H) 4.8 - 5.6 % Final    Comment:    (NOTE) Pre diabetes:          5.7%-6.4% Diabetes:              >6.4% Glycemic control for   <7.0% adults with diabetes     CBG: Recent Labs  Lab 01/02/18 1948  01/02/18 2327 01/03/18 0357 01/03/18 0808 01/03/18 1311  GLUCAP 151* 302* 245* 301* 190*      Critical care time: 33 min     Baltazar Apo, MD, PhD 01/03/2018, 2:33 PM [facility] [phone] or if no answer [phone]

## 2018-01-03 NOTE — Progress Notes (Addendum)
Subjective: Patient in bed. Intubated and lightly sedated with 2.5 mg of Versed.  Patient able to respond to name. Son at bedside. No seizure like activity noted. Patient is still in soft mitten restraints, but per nursing is not nearly as agitated and has not been swinging at staff.   Objective: Current vital signs: BP (!) 143/53   Pulse 64   Temp 98.9 F (37.2 C) (Oral)   Resp 19   Ht _0  (1.549 m)   Wt 84.3 kg   SpO2 100%   BMI 35.12 kg/m  Vital signs in last 24 hours: Temp:  [98 F (36.7 C)-99.3 F (37.4 C)] 98.9 F (37.2 C) (12/13 2300) Pulse Rate:  [54-96] 64 (12/14 0804) Resp:  [7-26] 19 (12/14 0804) BP: (102-190)/(43-127) 143/53 (12/14 0804) SpO2:  [99 %-100 %] 100 % (12/14 0804) FiO2 (%):  [30 %-40 %] 30 % (12/14 0804) Weight:  [84.3 kg] 84.3 kg (12/14 0336)  Intake/Output from previous day: 12/13 0701 - 12/14 0700 In: 2883.8 [I.V.:2013.3; NG/GT:670; IV Piggyback:200.5] Out: 1935 [Urine:1700; Emesis/NG output:235] Intake/Output this shift: No intake/output data recorded. Nutritional status:  Diet Order            Diet NPO time specified  Diet effective now              Neurologic Exam:  Ment: patient intubated, able to follow commands to squeeze my hand and wiggle toes. Unable to raise arms at this time. Also able to open eyes. Patient is hard of hearing at baseline.  CN:  Actively resists eye opening. When eyes are open, PERRL. Was able to turn head in response to examiner's voice. Keeps eyes closed to the extent that examiner is unable to assess EOM. Face  symmetric. Motor/Sensory: Moved BLE spontaneously. Moves all 4 extremities to noxious. 2/5 upper extremities and 2/5 lower extremities.    Lab Results: Results for orders placed or performed during the hospital encounter of 01/01/18 (from the past 48 hour(s))  Culture, blood (Routine X 2) w Reflex to ID Panel     Status: None (Preliminary result)   Collection Time: 01/01/18  8:45 AM  Result Value  Ref Range   Specimen Description BLOOD LEFT ANTECUBITAL    Special Requests      BOTTLES DRAWN AEROBIC AND ANAEROBIC Blood Culture adequate volume   Culture      NO GROWTH < 24 HOURS Performed at [facility] [address], [place], [place] [address]    Report Status PENDING   Triglycerides     Status: Abnormal   Collection Time: 01/01/18  8:53 AM  Result Value Ref Range   Triglycerides 171 (H) <150 mg/dL    Comment: Performed at [facility] [address], [place], [place] [address]  Basic metabolic panel     Status: Abnormal   Collection Time: 01/01/18  8:53 AM  Result Value Ref Range   Sodium 137 135 - 145 mmol/L   Potassium 4.5 3.5 - 5.1 mmol/L   Chloride 103 98 - 111 mmol/L   CO2 19 (L) 22 - 32 mmol/L   Glucose, Bld 303 (H) 70 - 99 mg/dL   BUN 27 (H) 8 - 23 mg/dL   Creatinine, Ser 1.49 (H) 0.44 - 1.00 mg/dL   Calcium 9.5 8.9 - 10.3 mg/dL   GFR calc non Af Amer 32 (L) >60 mL/min   GFR calc Af Amer 37 (L) >60 mL/min   Anion gap 15 5 - 15  Comment: Performed at [facility], [place] [address], [place], [place] [address]  Hemoglobin A1c     Status: Abnormal   Collection Time: 01/01/18  8:53 AM  Result Value Ref Range   Hgb A1c MFr Bld 13.0 (H) 4.8 - 5.6 %    Comment: (NOTE) Pre diabetes:          5.7%-6.4% Diabetes:              >6.4% Glycemic control for   <7.0% adults with diabetes    Mean Plasma Glucose 326.4 mg/dL    Comment: Performed at [facility] [address] [place], [place] [address]  CBC     Status: Abnormal   Collection Time: 01/01/18  8:53 AM  Result Value Ref Range   WBC 14.8 (H) 4.0 - 10.5 K/uL   RBC 4.92 3.87 - 5.11 MIL/uL   Hemoglobin 13.0 12.0 - 15.0 g/dL   HCT 40.9 36.0 - 46.0 %   MCV 83.1 80.0 - 100.0 fL   MCH 26.4 26.0 - 34.0 pg   MCHC 31.8 30.0 - 36.0 g/dL   RDW 15.5 11.5 - 15.5 %   Platelets 405 (H) 150 - 400 K/uL   nRBC 0.0 0.0 - 0.2 %    Comment: Performed at [facility], [place] [address]  [place], [place] [address]  Lactic acid, plasma     Status: Abnormal   Collection Time: 01/01/18  8:53 AM  Result Value Ref Range   Lactic Acid, Venous 2.8 (HH) 0.5 - 1.9 mmol/L    Comment: CRITICAL RESULT CALLED TO, READ BACK BY AND VERIFIED WITHWaynetta Pean RN [phone] 01/01/2018 BY A BENNETT Performed at [facility], [place] [address], [place],  [address]   Procalcitonin - Baseline     Status: None   Collection Time: 01/01/18  8:53 AM  Result Value Ref Range   Procalcitonin 0.28 ng/mL    Comment:        Interpretation: PCT (Procalcitonin) <= 0.5 ng/mL: Systemic infection (sepsis) is not likely. Local bacterial infection is possible. (NOTE)       Sepsis PCT Algorithm           Lower Respiratory Tract                                      Infection PCT Algorithm    ----------------------------     ----------------------------         PCT < 0.25 ng/mL                PCT < 0.10 ng/mL         Strongly encourage             Strongly discourage   discontinuation of antibiotics    initiation of antibiotics    ----------------------------     -----------------------------       PCT 0.25 - 0.50 ng/mL            PCT 0.10 - 0.25 ng/mL               OR       >80% decrease in PCT            Discourage initiation of  antibiotics      Encourage discontinuation           of antibiotics    ----------------------------     -----------------------------         PCT >= 0.50 ng/mL              PCT 0.26 - 0.50 ng/mL               AND        <80% decrease in PCT             Encourage initiation of                                             antibiotics       Encourage continuation           of antibiotics    ----------------------------     -----------------------------        PCT >= 0.50 ng/mL                  PCT > 0.50 ng/mL               AND         increase in PCT                  Strongly encourage                                      initiation  of antibiotics    Strongly encourage escalation           of antibiotics                                     -----------------------------                                           PCT <= 0.25 ng/mL                                                 OR                                        > 80% decrease in PCT                                     Discontinue / Do not initiate                                             antibiotics Performed at [facility], [place] [address], [place], [place] [address]   Culture, blood (Routine X 2) w Reflex to ID Panel  Status: None (Preliminary result)   Collection Time: 01/01/18  9:00 AM  Result Value Ref Range   Specimen Description BLOOD LEFT FOREARM    Special Requests      BOTTLES DRAWN AEROBIC AND ANAEROBIC Blood Culture adequate volume   Culture      NO GROWTH < 24 HOURS Performed at [facility], [place] [address], [place], [place] [address]    Report Status PENDING   Glucose, capillary     Status: Abnormal   Collection Time: 01/01/18  9:12 AM  Result Value Ref Range   Glucose-Capillary 334 (H) 70 - 99 mg/dL  Culture, respiratory (non-expectorated)     Status: None (Preliminary result)   Collection Time: 01/01/18  9:42 AM  Result Value Ref Range   Specimen Description TRACHEAL ASPIRATE    Special Requests NONE    Gram Stain      RARE WBC PRESENT, PREDOMINANTLY PMN ABUNDANT GRAM POSITIVE COCCI IN PAIRS IN CHAINS FEW GRAM POSITIVE RODS RARE GRAM NEGATIVE RODS    Culture      ABUNDANT Consistent with normal respiratory flora. Performed at [facility], [place] [address] [place], [place] [address]    Report Status PENDING   Glucose, capillary     Status: Abnormal   Collection Time: 01/01/18 10:43 AM  Result Value Ref Range   Glucose-Capillary 187 (H) 70 - 99 mg/dL  Glucose, capillary     Status: Abnormal   Collection Time: 01/01/18 11:52 AM  Result Value Ref Range   Glucose-Capillary 166 (H) 70 - 99 mg/dL  Basic  metabolic panel     Status: Abnormal   Collection Time: 01/01/18  1:07 PM  Result Value Ref Range   Sodium 136 135 - 145 mmol/L   Potassium 5.0 3.5 - 5.1 mmol/L   Chloride 104 98 - 111 mmol/L   CO2 19 (L) 22 - 32 mmol/L   Glucose, Bld 206 (H) 70 - 99 mg/dL   BUN 25 (H) 8 - 23 mg/dL   Creatinine, Ser 1.25 (H) 0.44 - 1.00 mg/dL   Calcium 9.3 8.9 - 10.3 mg/dL   GFR calc non Af Amer 40 (L) >60 mL/min   GFR calc Af Amer 46 (L) >60 mL/min   Anion gap 13 5 - 15    Comment: Performed at [facility], [place] [address], [place], [place] [address]  Glucose, capillary     Status: Abnormal   Collection Time: 01/01/18  1:16 PM  Result Value Ref Range   Glucose-Capillary 188 (H) 70 - 99 mg/dL  Basic metabolic panel     Status: Abnormal   Collection Time: 01/01/18  2:40 PM  Result Value Ref Range   Sodium 136 135 - 145 mmol/L   Potassium 4.6 3.5 - 5.1 mmol/L   Chloride 106 98 - 111 mmol/L   CO2 20 (L) 22 - 32 mmol/L   Glucose, Bld 205 (H) 70 - 99 mg/dL   BUN 25 (H) 8 - 23 mg/dL   Creatinine, Ser 1.34 (H) 0.44 - 1.00 mg/dL   Calcium 9.2 8.9 - 10.3 mg/dL   GFR calc non Af Amer 37 (L) >60 mL/min   GFR calc Af Amer 42 (L) >60 mL/min   Anion gap 10 5 - 15    Comment: Performed at [facility], [place] [address] [place], [place] [address]  Glucose, capillary     Status: Abnormal   Collection Time: 01/01/18  2:59 PM  Result Value  Ref Range   Glucose-Capillary 186 (H) 70 - 99 mg/dL  Glucose, capillary     Status: Abnormal   Collection Time: 01/01/18  4:12 PM  Result Value Ref Range   Glucose-Capillary 156 (H) 70 - 99 mg/dL  Glucose, capillary     Status: Abnormal   Collection Time: 01/01/18  5:16 PM  Result Value Ref Range   Glucose-Capillary 160 (H) 70 - 99 mg/dL  Glucose, capillary     Status: Abnormal   Collection Time: 01/01/18  6:24 PM  Result Value Ref Range   Glucose-Capillary 113 (H) 70 - 99 mg/dL  Glucose, capillary     Status: Abnormal   Collection Time: 01/01/18  7:36 PM   Result Value Ref Range   Glucose-Capillary 141 (H) 70 - 99 mg/dL  Glucose, capillary     Status: Abnormal   Collection Time: 01/01/18  8:43 PM  Result Value Ref Range   Glucose-Capillary 176 (H) 70 - 99 mg/dL  MRSA PCR Screening     Status: None   Collection Time: 01/01/18  9:09 PM  Result Value Ref Range   MRSA by PCR NEGATIVE NEGATIVE    Comment:        The GeneXpert MRSA Assay (FDA approved for NASAL specimens only), is one component of a comprehensive MRSA colonization surveillance program. It is not intended to diagnose MRSA infection nor to guide or monitor treatment for MRSA infections. Performed at [facility], [place] [address], [place], [place] [address]   Glucose, capillary     Status: Abnormal   Collection Time: 01/01/18  9:39 PM  Result Value Ref Range   Glucose-Capillary 187 (H) 70 - 99 mg/dL  Basic metabolic panel     Status: Abnormal   Collection Time: 01/01/18 10:45 PM  Result Value Ref Range   Sodium 135 135 - 145 mmol/L   Potassium 3.9 3.5 - 5.1 mmol/L   Chloride 104 98 - 111 mmol/L   CO2 21 (L) 22 - 32 mmol/L   Glucose, Bld 191 (H) 70 - 99 mg/dL   BUN 21 8 - 23 mg/dL   Creatinine, Ser 1.23 (H) 0.44 - 1.00 mg/dL   Calcium 8.9 8.9 - 10.3 mg/dL   GFR calc non Af Amer 41 (L) >60 mL/min   GFR calc Af Amer 47 (L) >60 mL/min   Anion gap 10 5 - 15    Comment: Performed at [facility], [place] [address] [place], [place] [address]  Glucose, capillary     Status: Abnormal   Collection Time: 01/01/18 10:49 PM  Result Value Ref Range   Glucose-Capillary 179 (H) 70 - 99 mg/dL  Glucose, capillary     Status: Abnormal   Collection Time: 01/01/18 11:51 PM  Result Value Ref Range   Glucose-Capillary 160 (H) 70 - 99 mg/dL  Glucose, capillary     Status: Abnormal   Collection Time: 01/02/18  1:05 AM  Result Value Ref Range   Glucose-Capillary 149 (H) 70 - 99 mg/dL  Glucose, capillary     Status: Abnormal   Collection Time: 01/02/18  4:39 AM  Result  Value Ref Range   Glucose-Capillary 251 (H) 70 - 99 mg/dL  I-STAT 3, arterial blood gas (G3+)     Status: Abnormal   Collection Time: 01/02/18  4:52 AM  Result Value Ref Range   pH, Arterial 7.334 (L) 7.350 - 7.450   pCO2 arterial 38.2 32.0 - 48.0 mmHg   pO2, Arterial 147.0 (H)  83.0 - 108.0 mmHg   Bicarbonate 20.3 20.0 - 28.0 mmol/L   TCO2 21 (L) 22 - 32 mmol/L   O2 Saturation 99.0 %   Acid-base deficit 5.0 (H) 0.0 - 2.0 mmol/L   Patient temperature 98.8 F    Collection site RADIAL, ALLEN'S TEST ACCEPTABLE    Drawn by Operator    Sample type ARTERIAL   Basic metabolic panel     Status: Abnormal   Collection Time: 01/02/18  6:12 AM  Result Value Ref Range   Sodium 135 135 - 145 mmol/L   Potassium 3.9 3.5 - 5.1 mmol/L   Chloride 104 98 - 111 mmol/L   CO2 20 (L) 22 - 32 mmol/L   Glucose, Bld 291 (H) 70 - 99 mg/dL   BUN 19 8 - 23 mg/dL   Creatinine, Ser 1.23 (H) 0.44 - 1.00 mg/dL   Calcium 8.7 (L) 8.9 - 10.3 mg/dL   GFR calc non Af Amer 41 (L) >60 mL/min   GFR calc Af Amer 47 (L) >60 mL/min   Anion gap 11 5 - 15    Comment: Performed at [facility] [address] [place], [place] [address]  Magnesium     Status: None   Collection Time: 01/02/18  6:12 AM  Result Value Ref Range   Magnesium 1.8 1.7 - 2.4 mg/dL    Comment: Performed at [facility] [address], [place], [place] [address]  Phosphorus     Status: None   Collection Time: 01/02/18  6:12 AM  Result Value Ref Range   Phosphorus 2.6 2.5 - 4.6 mg/dL    Comment: Performed at [facility] [address] [place], [place] [address]  CBC with Differential/Platelet     Status: Abnormal   Collection Time: 01/02/18  6:12 AM  Result Value Ref Range   WBC 13.4 (H) 4.0 - 10.5 K/uL   RBC 4.59 3.87 - 5.11 MIL/uL   Hemoglobin 12.1 12.0 - 15.0 g/dL   HCT 37.9 36.0 - 46.0 %   MCV 82.6 80.0 - 100.0 fL   MCH 26.4 26.0 - 34.0 pg   MCHC 31.9 30.0 - 36.0 g/dL   RDW 15.8 (H) 11.5 - 15.5 %   Platelets 370 150  - 400 K/uL   nRBC 0.0 0.0 - 0.2 %   Neutrophils Relative % 73 %   Neutro Abs 9.7 (H) 1.7 - 7.7 K/uL   Lymphocytes Relative 17 %   Lymphs Abs 2.3 0.7 - 4.0 K/uL   Monocytes Relative 10 %   Monocytes Absolute 1.3 (H) 0.1 - 1.0 K/uL   Eosinophils Relative 0 %   Eosinophils Absolute 0.0 0.0 - 0.5 K/uL   Basophils Relative 0 %   Basophils Absolute 0.0 0.0 - 0.1 K/uL   Immature Granulocytes 0 %   Abs Immature Granulocytes 0.06 0.00 - 0.07 K/uL    Comment: Performed at [facility], [place] [address] [place], [place] [address]  Procalcitonin     Status: None   Collection Time: 01/02/18  6:12 AM  Result Value Ref Range   Procalcitonin 0.72 ng/mL    Comment:        Interpretation: PCT > 0.5 ng/mL and <= 2 ng/mL: Systemic infection (sepsis) is possible, but other conditions are known to elevate PCT as well. (NOTE)       Sepsis PCT Algorithm           Lower Respiratory Tract  Infection PCT Algorithm    ----------------------------     ----------------------------         PCT < 0.25 ng/mL                PCT < 0.10 ng/mL         Strongly encourage             Strongly discourage   discontinuation of antibiotics    initiation of antibiotics    ----------------------------     -----------------------------       PCT 0.25 - 0.50 ng/mL            PCT 0.10 - 0.25 ng/mL               OR       >80% decrease in PCT            Discourage initiation of                                            antibiotics      Encourage discontinuation           of antibiotics    ----------------------------     -----------------------------         PCT >= 0.50 ng/mL              PCT 0.26 - 0.50 ng/mL                AND       <80% decrease in PCT             Encourage initiation of                                             antibiotics       Encourage continuation           of antibiotics    ----------------------------     -----------------------------        PCT >=  0.50 ng/mL                  PCT > 0.50 ng/mL               AND         increase in PCT                  Strongly encourage                                      initiation of antibiotics    Strongly encourage escalation           of antibiotics                                     -----------------------------                                           PCT <= 0.25 ng/mL  OR                                        > 80% decrease in PCT                                     Discontinue / Do not initiate                                             antibiotics Performed at [facility], [place] [address] [place], [place] [address]   Lactic acid, plasma     Status: None   Collection Time: 01/02/18  6:12 AM  Result Value Ref Range   Lactic Acid, Venous 0.9 0.5 - 1.9 mmol/L    Comment: Performed at [facility] [address], [place], [place] [address]  Glucose, capillary     Status: Abnormal   Collection Time: 01/02/18  7:19 AM  Result Value Ref Range   Glucose-Capillary 265 (H) 70 - 99 mg/dL  Glucose, capillary     Status: Abnormal   Collection Time: 01/02/18 11:01 AM  Result Value Ref Range   Glucose-Capillary 211 (H) 70 - 99 mg/dL  Glucose, capillary     Status: None   Collection Time: 01/02/18  3:28 PM  Result Value Ref Range   Glucose-Capillary 98 70 - 99 mg/dL  Magnesium     Status: None   Collection Time: 01/02/18  6:49 PM  Result Value Ref Range   Magnesium 1.9 1.7 - 2.4 mg/dL    Comment: Performed at [facility], [place] [address] [place], [place] [address]  Phosphorus     Status: None   Collection Time: 01/02/18  6:49 PM  Result Value Ref Range   Phosphorus 2.6 2.5 - 4.6 mg/dL    Comment: Performed at [facility] [address] [place], [place] [address]  Glucose, capillary     Status: Abnormal   Collection Time: 01/02/18  7:48 PM  Result Value Ref Range   Glucose-Capillary 151 (H) 70 - 99 mg/dL   Glucose, capillary     Status: Abnormal   Collection Time: 01/02/18 11:27 PM  Result Value Ref Range   Glucose-Capillary 302 (H) 70 - 99 mg/dL  Glucose, capillary     Status: Abnormal   Collection Time: 01/03/18  3:57 AM  Result Value Ref Range   Glucose-Capillary 245 (H) 70 - 99 mg/dL  I-STAT 3, arterial blood gas (G3+)     Status: None   Collection Time: 01/03/18  5:36 AM  Result Value Ref Range   pH, Arterial 7.363 7.350 - 7.450   pCO2 arterial 40.5 32.0 - 48.0 mmHg   pO2, Arterial 104.0 83.0 - 108.0 mmHg   Bicarbonate 23.0 20.0 - 28.0 mmol/L   TCO2 24 22 - 32 mmol/L   O2 Saturation 98.0 %   Acid-base deficit 2.0 0.0 - 2.0 mmol/L   Patient temperature 98.9 F    Collection site RADIAL, ALLEN'S TEST ACCEPTABLE    Drawn by Operator    Sample type ARTERIAL   Basic metabolic panel     Status: Abnormal   Collection Time: 01/03/18  6:51 AM  Result Value Ref Range   Sodium 137 135 - 145 mmol/L   Potassium 4.3 3.5 - 5.1 mmol/L   Chloride 106 98 - 111 mmol/L   CO2 21 (L) 22 - 32 mmol/L   Glucose, Bld 302 (H) 70 - 99 mg/dL   BUN 20 8 - 23 mg/dL   Creatinine, Ser 1.28 (H) 0.44 - 1.00 mg/dL   Calcium 8.5 (L) 8.9 - 10.3 mg/dL   GFR calc non Af Amer 39 (L) >60 mL/min   GFR calc Af Amer 45 (L) >60 mL/min   Anion gap 10 5 - 15    Comment: Performed at [facility] [address], [place], [place] [address]  Magnesium     Status: None   Collection Time: 01/03/18  6:51 AM  Result Value Ref Range   Magnesium 1.9 1.7 - 2.4 mg/dL    Comment: Performed at [place] [facility] [address] [place], [place] [address]  Phosphorus     Status: None   Collection Time: 01/03/18  6:51 AM  Result Value Ref Range   Phosphorus 3.2 2.5 - 4.6 mg/dL    Comment: Performed at [facility] [address] [place], [place] [address]  CBC with Differential/Platelet     Status: Abnormal   Collection Time: 01/03/18  6:51 AM  Result Value Ref Range   WBC 14.0 (H) 4.0 - 10.5 K/uL   RBC 4.28  3.87 - 5.11 MIL/uL   Hemoglobin 11.4 (L) 12.0 - 15.0 g/dL   HCT 36.2 36.0 - 46.0 %   MCV 84.6 80.0 - 100.0 fL   MCH 26.6 26.0 - 34.0 pg   MCHC 31.5 30.0 - 36.0 g/dL   RDW 16.3 (H) 11.5 - 15.5 %   Platelets 320 150 - 400 K/uL   nRBC 0.0 0.0 - 0.2 %   Neutrophils Relative % 64 %   Neutro Abs 8.9 (H) 1.7 - 7.7 K/uL   Lymphocytes Relative 22 %   Lymphs Abs 3.0 0.7 - 4.0 K/uL   Monocytes Relative 13 %   Monocytes Absolute 1.8 (H) 0.1 - 1.0 K/uL   Eosinophils Relative 1 %   Eosinophils Absolute 0.2 0.0 - 0.5 K/uL   Basophils Relative 0 %   Basophils Absolute 0.1 0.0 - 0.1 K/uL   Immature Granulocytes 0 %   Abs Immature Granulocytes 0.06 0.00 - 0.07 K/uL    Comment: Performed at [facility], [place] [address], [place], [place] [address]  Glucose, capillary     Status: Abnormal   Collection Time: 01/03/18  8:08 AM  Result Value Ref Range   Glucose-Capillary 301 (H) 70 - 99 mg/dL    Recent Results (from the past 240 hour(s))  Culture, blood (Routine X 2) w Reflex to ID Panel     Status: None (Preliminary result)   Collection Time: 01/01/18  8:45 AM  Result Value Ref Range Status   Specimen Description BLOOD LEFT ANTECUBITAL  Final   Special Requests   Final    BOTTLES DRAWN AEROBIC AND ANAEROBIC Blood Culture adequate volume   Culture   Final    NO GROWTH < 24 HOURS Performed at [facility], [place] [address] [place], [place] [address]    Report Status PENDING  Incomplete  Culture, blood (Routine X 2) w Reflex to ID Panel     Status: None (Preliminary result)   Collection Time: 01/01/18  9:00 AM  Result Value Ref Range Status  Specimen Description BLOOD LEFT FOREARM  Final   Special Requests   Final    BOTTLES DRAWN AEROBIC AND ANAEROBIC Blood Culture adequate volume   Culture   Final    NO GROWTH < 24 HOURS Performed at [facility], [address] [place], [place] [address]    Report Status PENDING  Incomplete  Culture, respiratory (non-expectorated)      Status: None (Preliminary result)   Collection Time: 01/01/18  9:42 AM  Result Value Ref Range Status   Specimen Description TRACHEAL ASPIRATE  Final   Special Requests NONE  Final   Gram Stain   Final    RARE WBC PRESENT, PREDOMINANTLY PMN ABUNDANT GRAM POSITIVE COCCI IN PAIRS IN CHAINS FEW GRAM POSITIVE RODS RARE GRAM NEGATIVE RODS    Culture   Final    ABUNDANT Consistent with normal respiratory flora. Performed at [facility], [place] [address] [place], [place] [address]    Report Status PENDING  Incomplete  MRSA PCR Screening     Status: None   Collection Time: 01/01/18  9:09 PM  Result Value Ref Range Status   MRSA by PCR NEGATIVE NEGATIVE Final    Comment:        The GeneXpert MRSA Assay (FDA approved for NASAL specimens only), is one component of a comprehensive MRSA colonization surveillance program. It is not intended to diagnose MRSA infection nor to guide or monitor treatment for MRSA infections. Performed at [facility], [place] [address] [place], [place] [address]     Lipid Panel Recent Labs    01/01/18 0853  TRIG 171*    Studies/Results: Dg Abd 1 View  Result Date: 01/01/2018 CLINICAL DATA:  NG placement EXAM: ABDOMEN - 1 VIEW COMPARISON:  CT abdomen 07/25/2007 FINDINGS: NG tip in the gastric antrum. Distended large and small bowel loops. Moderate stool in the right colon. Mild stool in the rectosigmoid. IMPRESSION: NG tube in the gastric antrum Adynamic ileus with retained stool in the colon. Moderate stool in the right colon. Electronically Signed   By: Franchot Gallo M.D.   On: 01/01/2018 16:55   Mr Cervical Spine Wo Contrast  Result Date: 01/02/2018 CLINICAL DATA:  Unresponsive patient.  Found face down. EXAM: MRI CERVICAL SPINE WITHOUT CONTRAST TECHNIQUE: Multiplanar, multisequence MR imaging of the cervical spine was performed. No intravenous contrast was administered. COMPARISON:  Brain MRI 12/31/2017 FINDINGS: Alignment: Normal.  Vertebrae: No focal marrow lesion. No compression fracture or evidence of discitis osteomyelitis. Cord: Normal caliber and signal. Posterior Fossa, vertebral arteries, paraspinal tissues: Visualized posterior fossa is normal. Vertebral artery flow voids are preserved. No prevertebral effusion. Disc levels: Sagittal imaging includes the atlantoaxial joint to the level of the T2-3 disc space, with axial imaging of the disc spaces from C2-3 to C7-T1. There is no spinal canal stenosis. Small disc bulges at C3-4, C4-5 and C5-6 with associated uncovertebral hypertrophy. There is severe left neural foraminal stenosis at C4-5 and C5-6. IMPRESSION: 1. No acute abnormality of the cervical spine. Normal appearance of the spinal cord. 2. Severe left neural foraminal stenosis at C4-5 and C5-6. Electronically Signed   By: Ulyses Jarred M.D.   On: 01/02/2018 18:54   Dg Chest Port 1 View  Result Date: 01/03/2018 CLINICAL DATA:  Respiratory failure. EXAM: PORTABLE CHEST 1 VIEW COMPARISON:  Radiograph of January 02, 2018. FINDINGS: Endotracheal tube tip is seen right at the carina; withdrawal by 2-3 cm is recommended. Nasogastric tube is unchanged in position.  No pneumothorax or significant pleural effusion is noted. No acute pulmonary disease is noted. Bony thorax is unremarkable. IMPRESSION: Endotracheal tube tip is seen at the carina; withdrawal by 2-3 cm is recommended. No acute abnormality seen in the chest. These results will be called to the ordering clinician or representative by the Radiologist Assistant, and communication documented in the PACS or zVision Dashboard. Electronically Signed   By: Marijo Conception, M.D.   On: 01/03/2018 08:05   Dg Chest Port 1 View  Result Date: 01/02/2018 CLINICAL DATA:  Respiratory failure EXAM: PORTABLE CHEST 1 VIEW COMPARISON:  Yesterday FINDINGS: Endotracheal tube tip is 18 mm above the carina. An orogastric tube reaches the stomach. Cardiomegaly. Indistinct infrahilar opacities.  No Kerley lines or pneumothorax. IMPRESSION: 1. Unremarkable hardware positioning. 2. Infrahilar opacities favoring atelectasis. Electronically Signed   By: Monte Fantasia M.D.   On: 01/02/2018 07:44    Medications:  Scheduled: . chlorhexidine gluconate (MEDLINE KIT)  15 mL Mouth Rinse BID  . cloNIDine  0.2 mg Per Tube BID  . diltiazem  60 mg Per Tube Q8H  . heparin  5,000 Units Subcutaneous Q8H  . insulin aspart  0-15 Units Subcutaneous Q4H  . insulin detemir  20 Units Subcutaneous BID  . mouth rinse  15 mL Mouth Rinse 10 times per day  . pantoprazole (PROTONIX) IV  40 mg Intravenous QHS   Continuous: . sodium chloride 80 mL/hr at 01/03/18 0700  . dextrose 5 % and 0.45% NaCl Stopped (01/03/18 0100)  . feeding supplement (VITAL HIGH PROTEIN) 50 mL/hr at 01/03/18 0700  . lactated ringers    . levETIRAcetam 500 mg (01/02/18 2145)  . propofol (DIPRIVAN) infusion 5 mcg/kg/min (01/03/18 0700)    Laurey Morale, MSN, NP-C [facility] [phone]  MRI from today (12/14):  Advanced changes of atrophy and small vessel disease with chronic microhemorrhages and scattered lacunar infarcts. Stable exam from 12/31/2017. No acute intracranial abnormality. No features to suggest interval development of PRES.   Assessment: 84 year old female with new onset refractory  seizures.  Most likely secondary to severe hyperglycemia.  Her blood pressures could suggest posterior reversible encephalopathy syndrome (PRES) but this was not present on her initial MRI. 1. On mechanical ventilation for airway protection.  2. On Keppra 500 mg BID 3. Follow up MRI today also shows no findings consistent with PRES. The findings were discussed with her daughter over the telephone.   Recommendations: 1. Continue Keppra at 500 mg BID. Switching to oral solution via NGT. 2. Decrease sedation and extubate as soon as possible.  Electronically signed: Dr. Kerney Elbe

## 2018-01-03 NOTE — Plan of Care (Signed)
  Problem: Clinical Measurements: Goal: Will remain free from infection Outcome: Progressing   Problem: Activity: Goal: Risk for activity intolerance will decrease Outcome: Progressing   Problem: Nutrition: Goal: Adequate nutrition will be maintained Outcome: Progressing   Problem: Pain Managment: Goal: General experience of comfort will improve Outcome: Progressing   Problem: Safety: Goal: Ability to remain free from injury will improve Outcome: Progressing

## 2018-01-04 ENCOUNTER — Inpatient Hospital Stay (HOSPITAL_COMMUNITY): Payer: Medicare HMO

## 2018-01-04 LAB — CBC
HCT: 32.9 % — ABNORMAL LOW (ref 36.0–46.0)
Hemoglobin: 9.4 g/dL — ABNORMAL LOW (ref 12.0–15.0)
MCH: 22.4 pg — ABNORMAL LOW (ref 26.0–34.0)
MCHC: 28.6 g/dL — ABNORMAL LOW (ref 30.0–36.0)
MCV: 78.5 fL — ABNORMAL LOW (ref 80.0–100.0)
Platelets: 248 10*3/uL (ref 150–400)
RBC: 4.19 MIL/uL (ref 3.87–5.11)
RDW: 14.7 % (ref 11.5–15.5)
WBC: 9.1 10*3/uL (ref 4.0–10.5)
nRBC: 0 % (ref 0.0–0.2)

## 2018-01-04 LAB — BASIC METABOLIC PANEL
Anion gap: 10 (ref 5–15)
BUN: 22 mg/dL (ref 8–23)
CALCIUM: 8.8 mg/dL — AB (ref 8.9–10.3)
CO2: 23 mmol/L (ref 22–32)
Chloride: 105 mmol/L (ref 98–111)
Creatinine, Ser: 1.03 mg/dL — ABNORMAL HIGH (ref 0.44–1.00)
GFR calc non Af Amer: 50 mL/min — ABNORMAL LOW (ref >60)
GFR, EST AFRICAN AMERICAN: 58 mL/min — AB (ref >60)
Glucose, Bld: 180 mg/dL — ABNORMAL HIGH (ref 70–99)
Potassium: 4.1 mmol/L (ref 3.5–5.1)
Sodium: 138 mmol/L (ref 135–145)

## 2018-01-04 LAB — GLUCOSE, CAPILLARY
Glucose-Capillary: 245 mg/dL — ABNORMAL HIGH (ref 70–99)
Glucose-Capillary: 256 mg/dL — ABNORMAL HIGH (ref 70–99)
Glucose-Capillary: 217 mg/dL — ABNORMAL HIGH (ref 70–99)

## 2018-01-04 LAB — TRIGLYCERIDES: Triglycerides: 96 mg/dL (ref <150)

## 2018-01-04 LAB — MAGNESIUM: Magnesium: 2 mg/dL (ref 1.7–2.4)

## 2018-01-04 LAB — PHOSPHORUS: Phosphorus: 3.5 mg/dL (ref 2.5–4.6)

## 2018-01-04 MED ORDER — POLYETHYLENE GLYCOL 3350 17 G PO PACK
17.0000 g | PACK | Freq: Every day | ORAL | Status: DC
  Administered 2018-01-04 – 2018-01-09 (×6): 17 g via ORAL
  Filled 2018-01-04 (×6): qty 1

## 2018-01-04 MED ORDER — BISACODYL 10 MG RE SUPP
10.0000 mg | Freq: Every day | RECTAL | Status: DC | PRN
  Administered 2018-01-05: 10 mg via RECTAL
  Filled 2018-01-04: qty 1

## 2018-01-04 MED ORDER — SODIUM CHLORIDE 0.9 % IV SOLN
INTRAVENOUS | Status: DC
  Administered 2018-01-04: via INTRAVENOUS

## 2018-01-04 MED ORDER — DEXAMETHASONE SODIUM PHOSPHATE 4 MG/ML IJ SOLN
4.0000 mg | Freq: Four times a day (QID) | INTRAMUSCULAR | Status: AC
  Administered 2018-01-04 – 2018-01-05 (×4): 4 mg via INTRAVENOUS
  Filled 2018-01-04 (×4): qty 1

## 2018-01-04 NOTE — Progress Notes (Signed)
RT Note: RT came in to assess patient. Patient was placed on her SBT and is tolerating it well. Day shift RT was notified in report that the goal is to move towards extubation today. A cuff leak test was performed on patient and it was negative. No cuff leak was detected. Patients RN, Nat Math is at bedside and is aware. She will notify her physician this morning. Weaning continues with no complications. RT will continue to monitor.

## 2018-01-04 NOTE — Progress Notes (Signed)
NAME:  [name], MRN:  [id number], DOB:  Mar 24, 1932, LOS: 3 ADMISSION DATE:  01/01/2018, CONSULTATION DATE:  12/12 REFERRING MD:  Dr. Kathrynn Humble, CHIEF COMPLAINT:  Seizure   Brief History   84 year old female with past medical history of diabetes and hypertension admitted 12/12 for status epilepticus requiring intubation.  History of present illness   84 year old female with past medical history as below, which is significant for diabetes, hypertension, stroke, and small bowel obstruction.  Symptoms started 12/11 when she developed slurred speech [place] the bank.  She had several episodes of this causing her to present to the emergency department in the evening hours of 12/11.  Work-up including CT scan and MRI were negative and she was ultimately discharged home.  Then in the early morning hours of 12/12 she was found to be unresponsive by her grandson.  EMS was called and in route to the emergency department she experienced seizure-like activity.  She had another witnessed seizure in the emergency department and was intubated for airway protection.  Repeat CT of the head was nonacute.  Neurology was consulted in the emergency department and has made recommendations for antiepileptic medications and started propofol infusion.  Past Medical History   has a past medical history of ANEMIA, IRON DEFICIENCY (08/11/2008), ANGIOEDEMA (03/01/2008), CEREBROVASCULAR ACCIDENT, HX OF (03/01/2008), CONTACT DERMATITIS&OTHER ECZEMA DUE UNSPEC CAUSE (11/28/2008), DIABETES MELLITUS (03/01/2008), Dysuria (01/04/2010), GERD (03/01/2008), HYPERSOMNIA (05/30/2009), HYPERTENSION (03/01/2008), INTERNAL HEMORRHOIDS (03/01/2008), KNEE PAIN, RIGHT (09/05/2009), NONSPECIFIC ABN FINDNG RAD&OTH EXAM BILARY TRCT (03/07/2008), OBESITY (01/10/2010), PERIPHERAL EDEMA (08/10/2008), POLYP, GALLBLADDER (03/07/2008), SMALL BOWEL OBSTRUCTION, HX OF (03/01/2008), SNORING (05/11/2009), Stroke ([place]) (04/08/2016), and THROMBOCYTOSIS  (08/18/2008).   Significant Hospital Events   12/11 to ED for slurred speech/TIA. Discharged 12/12 to ED found down. Seizures. Intubated  12/14 Cervical spine cleared, no further seizure activity.  PSV  12/15 No cuff leak on exam  Consults:  Neurology  Procedures:  ETT 12/12 >>  Significant Diagnostic Tests:  CT head 12/11 > No CT evidence for acute intracranial abnormality. Atrophy and small vessel ischemic changes of the white matter. MRI brain 12/11 > No acute intracranial process. Multiple old cerebellar infarcts. Moderate to severe chronic small vessel ischemic changes. CT head Cspine 12/13 > No acute intracranial hemorrhage. No acute/traumatic cervical spine pathology. 01/03/2018 MRI of the head plan>> no features to suggest PRES 01/01/2018 continuous EEG>> no evidence of seizure activity  Micro Data:  Sputum 12/12 >> negative  BCx2 12/12 >>   Antimicrobials:     Interim history/subjective:  RN reports pt weaning on PSV 5/5 but tongue remains swollen.  No cuff leak when assessed.  No acute events overnight.   Objective   Blood pressure (!) 125/49, pulse (!) 40, temperature 98.3 F (36.8 C), temperature source Axillary, resp. rate 14, height 5\' 1"  (1.549 m), weight 84.3 kg, SpO2 99 %.    Vent Mode: CPAP;PSV FiO2 (%):  [30 %] 30 % Set Rate:  [18 bmp] 18 bmp Vt Set:  [380 mL] 380 mL PEEP:  [5 cmH20] 5 cmH20 Pressure Support:  [5 cmH20] 5 cmH20 Plateau Pressure:  [6 cmH20-15 cmH20] 13 cmH20   Intake/Output Summary (Last 24 hours) at 01/04/2018 1228 Last data filed at 01/04/2018 1100 Gross per 24 hour  Intake 3317.85 ml  Output 930 ml  Net 2387.85 ml   Filed Weights   01/01/18 1501 01/03/18 0336 01/04/18 0500  Weight: 84.1 kg 84.3 kg 84.3 kg    Examination: General: adult female lying in bed  on vent in NAD HEENT: MM pink/moist, ETT, bruising on tongue noted Neuro: awakens, follows commands CV: s1s2 rrr, no m/r/g PULM: even/non-labored, lungs bilaterally  clear  UX:LKGM, non-tender, bsx4 active  Extremities: warm/dry, no overt edema / difficult to assess with body habitus   Skin: no rashes or lesions  Resolved Hospital Problem list     Assessment & Plan:   Status epilepticus  -No seizure history.  No evidence of press on repeat MRI 12/13 P: Keppra 500 mg BID  Ensure BP control Continue clonidine, cardizem for BP control  Labetalol PRN   History of CVA on Xarelto P: Consider restarting xarelto  Neuro rec's > ok to resume xarelto for stroke prevention, VTE prophylaxis. No role for ASA if on Xarelto   Inability to protect airway  -in setting of status epilepticus  P: SBT as tolerated but no plan for extubation 12/15 with tongue swelling  Decadron 4mg  IV Q6 for four doses  Hopeful for extubation 12/16  PRVC 8 cc/kg as rest mode  Follow intermittent CXR > 12/15 images reviewed, no infiltrate or edema  Hyperglycemia -history of DM P: Continue levemir 20 units BID  SSI, moderate scale   Hypertensive urgency -initial concern for PRES but not described on imaging, including MRI from 12/11 or 12/13 P: ICU monitoring  Continue clonidine, diltiazem and PRN labetalol for BP control   Metabolic acidosis with elevated anion gap -lactic acid 13 P: Monitor/resolved  NS to 40 ml/hr   Constipation  P: Dulcolax PRN Miralax QD  Best practice:  Diet: NPO Pain/Anxiety/Delirium protocol (if indicated): in place VAP protocol (if indicated): Yes DVT prophylaxis: sq heparin GI prophylaxis: protonix Glucose control: SS  Mobility: BR Code Status: FULL Family Communication: Updated family 12/14 at bedside Disposition: ICU, critically ill.   Labs   CBC: Recent Labs  Lab 12/31/17 1724  01/01/18 0515 01/01/18 0520 01/01/18 0853 01/02/18 0612 01/03/18 0651 01/04/18 0314  WBC 9.2  --  14.7*  --  14.8* 13.4* 14.0* 9.1  NEUTROABS 3.6  --  6.4  --   --  9.7* 8.9*  --   HGB 12.4   < > 12.8 15.0 13.0 12.1 11.4* 9.4*  HCT 39.3    < > 42.2 44.0 40.9 37.9 36.2 32.9*  MCV 83.6  --  87.0  --  83.1 82.6 84.6 78.5*  PLT 359  --  386  --  405* 370 320 248   < > = values in this interval not displayed.    Basic Metabolic Panel: Recent Labs  Lab 01/01/18 0517  01/01/18 1440 01/01/18 2245 01/02/18 0612 01/02/18 1849 01/03/18 0651 01/04/18 0314  NA  --    < > 136 135 135  --  137 138  K  --    < > 4.6 3.9 3.9  --  4.3 4.1  CL  --    < > 106 104 104  --  106 105  CO2  --    < > 20* 21* 20*  --  21* 23  GLUCOSE  --    < > 205* 191* 291*  --  302* 180*  BUN  --    < > 25* 21 19  --  20 22  CREATININE  --    < > 1.34* 1.23* 1.23*  --  1.28* 1.03*  CALCIUM  --    < > 9.2 8.9 8.7*  --  8.5* 8.8*  MG 2.2  --   --   --  1.8 1.9 1.9 2.0  PHOS 4.3  --   --   --  2.6 2.6 3.2 3.5   < > = values in this interval not displayed.   GFR: Estimated Creatinine Clearance: 40.8 mL/min (A) (by C-G formula based on SCr of 1.03 mg/dL (H)). Recent Labs  Lab 01/01/18 0520 01/01/18 0853 01/02/18 0612 01/03/18 0651 01/04/18 0314  PROCALCITON  --  0.28 0.72 0.47  --   WBC  --  14.8* 13.4* 14.0* 9.1  LATICACIDVEN 13.28* 2.8* 0.9  --   --     Liver Function Tests: Recent Labs  Lab 12/31/17 1724 01/01/18 0515  AST 16 25  ALT 17 17  ALKPHOS 94 92  BILITOT 0.4 0.5  PROT 7.7 7.9  ALBUMIN 3.5 3.8   No results for input(s): LIPASE, AMYLASE in the last 168 hours. No results for input(s): AMMONIA in the last 168 hours.  ABG    Component Value Date/Time   PHART 7.363 01/03/2018 0536   PCO2ART 40.5 01/03/2018 0536   PO2ART 104.0 01/03/2018 0536   HCO3 23.0 01/03/2018 0536   TCO2 24 01/03/2018 0536   ACIDBASEDEF 2.0 01/03/2018 0536   O2SAT 98.0 01/03/2018 0536     Coagulation Profile: Recent Labs  Lab 12/31/17 1724  INR 1.18    Cardiac Enzymes: Recent Labs  Lab 01/01/18 0515  CKTOTAL 59    HbA1C: Hgb A1c MFr Bld  Date/Time Value Ref Range Status  01/01/2018 08:53 AM 13.0 (H) 4.8 - 5.6 % Final    Comment:     (NOTE) Pre diabetes:          5.7%-6.4% Diabetes:              >6.4% Glycemic control for   <7.0% adults with diabetes   07/26/2017 12:03 AM 10.6 (H) 4.8 - 5.6 % Final    Comment:    (NOTE) Pre diabetes:          5.7%-6.4% Diabetes:              >6.4% Glycemic control for   <7.0% adults with diabetes     CBG: Recent Labs  Lab 01/03/18 1941 01/03/18 2328 01/04/18 0343 01/04/18 0737 01/04/18 1205  GLUCAP 236* 207* 245* 256* 217*      Critical care time: 30 minutes     Noe Gens, NP-C [facility] Pgr: [phone] or if no answer [phone] 01/04/2018, 12:28 PM

## 2018-01-04 NOTE — Progress Notes (Signed)
Per review of Dr. 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 Seth Bake. She was still on Xarelto for VTE prophylaxis at the time of the last admission and was discharged home on this medication. Dr. Clydene Fake note also documents that the patient has a history of atrial fibrillation.   A/R: 1. Regarding CCM note with question on when to restart Xarelto: Based on review of her chart this is indicated primarily for VTE prophylaxis, but also for stroke prevention in the setting of documented history of atrial fibrillation. Would restart Xarelto if there are no contraindications. No indication for addition of ASA if she is on Xarelto.  2. Possible history of atrial fibrillation: A-fib is not listed as one of her diagnoses in Epic, and EKGs in Epic were with sinus rhrthm, so intermittent atrial fibrillation may be the case. Would interview family further regarding whether or not this has definitively diagnosed in the past. If not, she may need a Holter monitor or loop recorder at discharge. There has been no atrial fibrillation on her rhythm strips this admission.  3. Refractory seizures: Were most likely secondary to severe hyperglycemia at the time of admission. Continue Keppra 500 mg BID. Repeat MRI did not show PRES.  4. Encephalopathy: Likely secondary to prolonged seizure activity in the setting of hyperglycemia. Would decrease sedation and extubate as soon as possible followed by PT/OT and OOB to chair daily.  5. Neurology team will sign off. Please call if there are additional questions.   Electronically signed: Dr. Kerney Elbe

## 2018-01-05 ENCOUNTER — Encounter (HOSPITAL_COMMUNITY): Payer: Self-pay

## 2018-01-05 LAB — CBC
HCT: 34 % — ABNORMAL LOW (ref 36.0–46.0)
Hemoglobin: 10.6 g/dL — ABNORMAL LOW (ref 12.0–15.0)
MCH: 26 pg (ref 26.0–34.0)
MCHC: 31.2 g/dL (ref 30.0–36.0)
MCV: 83.5 fL (ref 80.0–100.0)
Platelets: 376 10*3/uL (ref 150–400)
RBC: 4.07 MIL/uL (ref 3.87–5.11)
RDW: 16.4 % — ABNORMAL HIGH (ref 11.5–15.5)
WBC: 11.7 10*3/uL — ABNORMAL HIGH (ref 4.0–10.5)
nRBC: 0 % (ref 0.0–0.2)

## 2018-01-05 LAB — BASIC METABOLIC PANEL
Anion gap: 13 (ref 5–15)
BUN: 29 mg/dL — AB (ref 8–23)
CO2: 18 mmol/L — ABNORMAL LOW (ref 22–32)
Calcium: 9 mg/dL (ref 8.9–10.3)
Chloride: 107 mmol/L (ref 98–111)
Creatinine, Ser: 1.14 mg/dL — ABNORMAL HIGH (ref 0.44–1.00)
GFR calc Af Amer: 51 mL/min — ABNORMAL LOW (ref >60)
GFR calc non Af Amer: 44 mL/min — ABNORMAL LOW (ref >60)
Glucose, Bld: 290 mg/dL — ABNORMAL HIGH (ref 70–99)
Potassium: 5.4 mmol/L — ABNORMAL HIGH (ref 3.5–5.1)
Sodium: 138 mmol/L (ref 135–145)

## 2018-01-05 LAB — GLUCOSE, CAPILLARY
GLUCOSE-CAPILLARY: 295 mg/dL — AB (ref 70–99)
GLUCOSE-CAPILLARY: 376 mg/dL — AB (ref 70–99)
GLUCOSE-CAPILLARY: 172 mg/dL — AB (ref 70–99)
Glucose-Capillary: 242 mg/dL — ABNORMAL HIGH (ref 70–99)
Glucose-Capillary: 302 mg/dL — ABNORMAL HIGH (ref 70–99)
Glucose-Capillary: 263 mg/dL — ABNORMAL HIGH (ref 70–99)
Glucose-Capillary: 253 mg/dL — ABNORMAL HIGH (ref 70–99)
Glucose-Capillary: 257 mg/dL — ABNORMAL HIGH (ref 70–99)
Glucose-Capillary: 262 mg/dL — ABNORMAL HIGH (ref 70–99)

## 2018-01-05 LAB — MAGNESIUM: Magnesium: 1.8 mg/dL (ref 1.7–2.4)

## 2018-01-05 LAB — PHOSPHORUS: Phosphorus: 3.5 mg/dL (ref 2.5–4.6)

## 2018-01-05 MED ORDER — MAGNESIUM HYDROXIDE 400 MG/5ML PO SUSP
30.0000 mL | Freq: Every day | ORAL | Status: DC | PRN
  Administered 2018-01-05: 30 mL via ORAL
  Filled 2018-01-05 (×2): qty 30

## 2018-01-05 MED ORDER — DOCUSATE SODIUM 100 MG PO CAPS
100.0000 mg | ORAL_CAPSULE | Freq: Two times a day (BID) | ORAL | Status: DC
  Administered 2018-01-05 – 2018-01-09 (×8): 100 mg via ORAL
  Filled 2018-01-05 (×8): qty 1

## 2018-01-05 MED ORDER — DILTIAZEM HCL 60 MG PO TABS
60.0000 mg | ORAL_TABLET | Freq: Three times a day (TID) | ORAL | Status: DC
  Administered 2018-01-05: 60 mg via ORAL
  Filled 2018-01-05: qty 1

## 2018-01-05 MED ORDER — SENNA 8.6 MG PO TABS
1.0000 | ORAL_TABLET | Freq: Every day | ORAL | Status: DC
  Administered 2018-01-05 – 2018-01-09 (×5): 8.6 mg via ORAL
  Filled 2018-01-05 (×5): qty 1

## 2018-01-05 MED ORDER — RIVAROXABAN 15 MG PO TABS
15.0000 mg | ORAL_TABLET | Freq: Every day | ORAL | Status: DC
  Administered 2018-01-05 – 2018-01-08 (×4): 15 mg via ORAL
  Filled 2018-01-05 (×4): qty 1

## 2018-01-05 MED ORDER — LEVETIRACETAM 500 MG PO TABS
500.0000 mg | ORAL_TABLET | Freq: Two times a day (BID) | ORAL | Status: DC
  Administered 2018-01-05 – 2018-01-09 (×8): 500 mg via ORAL
  Filled 2018-01-05 (×8): qty 1

## 2018-01-05 MED ORDER — ORAL CARE MOUTH RINSE
15.0000 mL | Freq: Two times a day (BID) | OROMUCOSAL | Status: DC
  Administered 2018-01-05 – 2018-01-09 (×7): 15 mL via OROMUCOSAL

## 2018-01-05 NOTE — Progress Notes (Signed)
Rehab Admissions Coordinator Note:  Patient was screened by Cleatrice Burke for appropriateness for an Inpatient Acute Rehab Consult per PT recommendation.  At this time, we are recommending Inpatient Rehab consult. Please place order for conuslt.  Cleatrice Burke 01/05/2018, 9:56 PM  I can be reached at [phone].

## 2018-01-05 NOTE — Progress Notes (Signed)
[facility] notified of patients K+ 5.4. No new orders at this time. HR NSR@70 . Will continue to monitor.

## 2018-01-05 NOTE — Progress Notes (Signed)
PT Cancellation Note  Patient Details Name: DORLA GUIZAR MRN: [id number] DOB: 1932/12/24   Cancelled Treatment:    Reason Eval/Treat Not Completed: Medical issues which prohibited therapy.  Per RN, they are hopeful to extubate pt today.  PT will check back late in the afternoon to see if we can proceed with eval off of the ventilator.    Thanks,  Barbarann Ehlers. Korby Ratay, PT, DPT  Acute Rehabilitation [phone] pager [phone]) [phone] office     Wells Guiles B Zadkiel Dragan 01/05/2018, 10:44 AM

## 2018-01-05 NOTE — Progress Notes (Signed)
NAME:  [name], MRN:  [id number], DOB:  [birth year], LOS: 4 ADMISSION DATE:  01/01/2018, CONSULTATION DATE:  12/12 REFERRING MD:  Dr. Kathrynn Humble, CHIEF COMPLAINT:  Seizure   Brief History   84 year old female with past medical history of diabetes and hypertension admitted 12/12 for status epilepticus requiring intubation.  History of present illness   84 year old female with past medical history as below, which is significant for diabetes, hypertension, stroke, and small bowel obstruction.  Symptoms started 12/11 when she developed slurred speech [place] at the bank.  She had several episodes of this causing her to present to the emergency department in the evening hours of 12/11.  Work-up including CT scan and MRI were negative and she was ultimately discharged home.  Then in the early morning hours of 12/12 she was found to be unresponsive by her grandson.  EMS was called and in route to the emergency department she experienced seizure-like activity.  She had another witnessed seizure in the emergency department and was intubated for airway protection.  Repeat CT of the head was nonacute.  Neurology was consulted in the emergency department and has made recommendations for antiepileptic medications and started propofol infusion.  Past Medical History   has a past medical history of A-fib ([place]) (2019), ANEMIA, IRON DEFICIENCY (08/11/2008), ANGIOEDEMA (03/01/2008), CEREBROVASCULAR ACCIDENT, HX OF (03/01/2008), CONTACT DERMATITIS&OTHER ECZEMA DUE UNSPEC CAUSE (11/28/2008), DIABETES MELLITUS (03/01/2008), Dysuria (01/04/2010), GERD (03/01/2008), HYPERSOMNIA (05/30/2009), HYPERTENSION (03/01/2008), INTERNAL HEMORRHOIDS (03/01/2008), KNEE PAIN, RIGHT (09/05/2009), NONSPECIFIC ABN FINDNG RAD&OTH EXAM BILARY TRCT (03/07/2008), OBESITY (01/10/2010), PERIPHERAL EDEMA (08/10/2008), POLYP, GALLBLADDER (03/07/2008), SMALL BOWEL OBSTRUCTION, HX OF (03/01/2008), SNORING (05/11/2009), Stroke ([place]) (04/08/2016), and THROMBOCYTOSIS  (08/18/2008).   Significant Hospital Events   12/11 to ED for slurred speech/TIA. Discharged 12/12 to ED found down. Seizures. Intubated  12/14 Cervical spine cleared, no further seizure activity.  PSV  12/15 No cuff leak on exam  Consults:  Neurology  Procedures:  ETT 12/12 >>  Significant Diagnostic Tests:  CT head 12/11 > No CT evidence for acute intracranial abnormality. Atrophy and small vessel ischemic changes of the white matter. MRI brain 12/11 > No acute intracranial process. Multiple old cerebellar infarcts. Moderate to severe chronic small vessel ischemic changes. CT head Cspine 12/13 > No acute intracranial hemorrhage. No acute/traumatic cervical spine pathology. 01/03/2018 MRI of the head plan>> no features to suggest PRES 01/01/2018 continuous EEG>> no evidence of seizure activity  Micro Data:  Sputum 12/12 >> negative  BCx2 12/12 >>   Antimicrobials:     Interim history/subjective:  No events overnight, remains intubated but weaning and following commands  Objective   Blood pressure (!) 146/65, pulse 78, temperature 98.5 F (36.9 C), temperature source Axillary, resp. rate 14, height 5\' 1"  (1.549 m), weight 85.1 kg, SpO2 100 %.    Vent Mode: CPAP;PSV FiO2 (%):  [30 %] 30 % Set Rate:  [18 bmp] 18 bmp Vt Set:  [380 mL] 380 mL PEEP:  [5 cmH20] 5 cmH20 Pressure Support:  [5 cmH20] 5 cmH20 Plateau Pressure:  [6 cmH20-16 cmH20] 13 cmH20   Intake/Output Summary (Last 24 hours) at 01/05/2018 1142 Last data filed at 01/05/2018 4098 Gross per 24 hour  Intake 2187.64 ml  Output 965 ml  Net 1222.64 ml   Filed Weights   01/03/18 0336 01/04/18 0500 01/05/18 0223  Weight: 84.3 kg 84.3 kg 85.1 kg    Examination: General: Elderly female, resting in exam bed, NAD HEENT: [place]/AT, PERRL, EOM-I and MMM Neuro: Awake and  following commands CV: RRR, Nl S1/S2 and -M/R/G PULM: CTA bilaterally GI: Soft, NT, ND and +BS Extremities: -edema and -tenderness Skin:  Intact  Resolved Hospital Problem list     Assessment & Plan:   Status epilepticus  -No seizure history.  No evidence of press on repeat MRI 12/13 P: - Keppra 500 mg PO BID - Ensure BP control continue clonidine, cardizem for BP control  - PRN labetalol  History of CVA on Xarelto P: - Start xarelto once able to take PO  Inability to protect airway  -in setting of status epilepticus  P: - Extubate - Titrate O2 for sat of 88-92% - IS - Ambulate - SLP  Hyperglycemia -history of DM P: - Continue levemir 20 units BID  - CBG - SSI  Hypertensive urgency -initial concern for PRES but not described on imaging, including MRI from 12/11 or 12/13 P: - ICU monitoring  - Continue clonidine, diltiazem and PRN labetalol for BP control   Metabolic acidosis with elevated anion gap -lactic acid 13 P: - Monitor/resolved  - KVO IVF  Constipation  P: - Dulcolax PRN - Miralax QD  Spoke with daughter, full code status for now  Best practice:  Diet: NPO Pain/Anxiety/Delirium protocol (if indicated): in place VAP protocol (if indicated): Yes DVT prophylaxis: sq heparin GI prophylaxis: protonix Glucose control: SS  Mobility: BR Code Status: FULL Family Communication: Updated family 12/14 at bedside Disposition: ICU, critically ill.   Labs   CBC: Recent Labs  Lab 12/31/17 1724  01/01/18 0515  01/01/18 2458 01/02/18 0612 01/03/18 0651 01/04/18 0314 01/05/18 0624  WBC 9.2  --  14.7*  --  14.8* 13.4* 14.0* 9.1 11.7*  NEUTROABS 3.6  --  6.4  --   --  9.7* 8.9*  --   --   HGB 12.4   < > 12.8   < > 13.0 12.1 11.4* 9.4* 10.6*  HCT 39.3   < > 42.2   < > 40.9 37.9 36.2 32.9* 34.0*  MCV 83.6  --  87.0  --  83.1 82.6 84.6 78.5* 83.5  PLT 359  --  386  --  405* 370 320 248 376   < > = values in this interval not displayed.    Basic Metabolic Panel: Recent Labs  Lab 01/01/18 2245 01/02/18 0612 01/02/18 1849 01/03/18 0651 01/04/18 0314 01/05/18 0318  NA 135 135  --   137 138 138  K 3.9 3.9  --  4.3 4.1 5.4*  CL 104 104  --  106 105 107  CO2 21* 20*  --  21* 23 18*  GLUCOSE 191* 291*  --  302* 180* 290*  BUN 21 19  --  20 22 29*  CREATININE 1.23* 1.23*  --  1.28* 1.03* 1.14*  CALCIUM 8.9 8.7*  --  8.5* 8.8* 9.0  MG  --  1.8 1.9 1.9 2.0 1.8  PHOS  --  2.6 2.6 3.2 3.5 3.5   GFR: Estimated Creatinine Clearance: 37 mL/min (A) (by C-G formula based on SCr of 1.14 mg/dL (H)). Recent Labs  Lab 01/01/18 0520 01/01/18 0853 01/02/18 0612 01/03/18 0651 01/04/18 0314 01/05/18 0624  PROCALCITON  --  0.28 0.72 0.47  --   --   WBC  --  14.8* 13.4* 14.0* 9.1 11.7*  LATICACIDVEN 13.28* 2.8* 0.9  --   --   --     Liver Function Tests: Recent Labs  Lab 12/31/17 1724 01/01/18 0515  AST 16 25  ALT 17 17  ALKPHOS 94 92  BILITOT 0.4 0.5  PROT 7.7 7.9  ALBUMIN 3.5 3.8   No results for input(s): LIPASE, AMYLASE in the last 168 hours. No results for input(s): AMMONIA in the last 168 hours.  ABG    Component Value Date/Time   PHART 7.363 01/03/2018 0536   PCO2ART 40.5 01/03/2018 0536   PO2ART 104.0 01/03/2018 0536   HCO3 23.0 01/03/2018 0536   TCO2 24 01/03/2018 0536   ACIDBASEDEF 2.0 01/03/2018 0536   O2SAT 98.0 01/03/2018 0536     Coagulation Profile: Recent Labs  Lab 12/31/17 1724  INR 1.18    Cardiac Enzymes: Recent Labs  Lab 01/01/18 0515  CKTOTAL 59    HbA1C: Hgb A1c MFr Bld  Date/Time Value Ref Range Status  01/01/2018 08:53 AM 13.0 (H) 4.8 - 5.6 % Final    Comment:    (NOTE) Pre diabetes:          5.7%-6.4% Diabetes:              >6.4% Glycemic control for   <7.0% adults with diabetes   07/26/2017 12:03 AM 10.6 (H) 4.8 - 5.6 % Final    Comment:    (NOTE) Pre diabetes:          5.7%-6.4% Diabetes:              >6.4% Glycemic control for   <7.0% adults with diabetes     CBG: Recent Labs  Lab 01/04/18 1630 01/04/18 1927 01/04/18 2313 01/05/18 0324 01/05/18 0709  GLUCAP 242* 302* 263* 295* 376*    The  patient is critically ill with multiple organ systems failure and requires high complexity decision making for assessment and support, frequent evaluation and titration of therapies, application of advanced monitoring technologies and extensive interpretation of multiple databases.   Critical Care Time devoted to patient care services described in this note is  32  Minutes. This time reflects time of care of this signee Dr Jennet Maduro. This critical care time does not reflect procedure time, or teaching time or supervisory time of PA/NP/Med student/Med Resident etc but could involve care discussion time.  Rush Farmer, M.D. [facility]. Pager: [phone]. After hours pager: [phone].

## 2018-01-05 NOTE — Progress Notes (Signed)
ANTICOAGULATION CONSULT NOTE - Initial Consult  Pharmacy Consult for Xarelto Indication: atrial fibrillation  Allergies  Allergen Reactions  . Ace Inhibitors Swelling    Angioedema   . Prednisone Nausea And Vomiting, Other (See Comments) and Hypertension    Blood sugar increased (also)  . Sulfa Antibiotics Hives  . Penicillins Rash    Has patient had a PCN reaction causing immediate rash, facial/tongue/throat swelling, SOB or lightheadedness with hypotension: Yes Has patient had a PCN reaction causing severe rash involving mucus membranes or skin necrosis: No Has patient had a PCN reaction that required hospitalization: No Has patient had a PCN reaction occurring within the last 10 years: No If all of the above answers are "NO", then may proceed with Cephalosporin use.   . Sulfamethoxazole Rash  . Sulfonamide Derivatives Rash    Patient Measurements: Height: 5\' 1"  (154.9 cm) Weight: 187 lb 9.8 oz (85.1 kg) IBW/kg (Calculated) : 47.8  Vital Signs: Temp: 97.2 F (36.2 C) (12/16 1535) Temp Source: Oral (12/16 1535) BP: 107/48 (12/16 1300) Pulse Rate: 66 (12/16 1300)  Labs: Recent Labs    01/03/18 0651 01/04/18 0314 01/05/18 0318 01/05/18 0624  HGB 11.4* 9.4*  --  10.6*  HCT 36.2 32.9*  --  34.0*  PLT 320 248  --  376  CREATININE 1.28* 1.03* 1.14*  --     Estimated Creatinine Clearance: 37 mL/min (A) (by C-G formula based on SCr of 1.14 mg/dL (H)).   Medical History: Past Medical History:  Diagnosis Date  . A-fib [facility]) 2019   Per patients daughter, [name] [phone]  . ANEMIA, IRON DEFICIENCY 08/11/2008  . ANGIOEDEMA 03/01/2008  . CEREBROVASCULAR ACCIDENT, HX OF 03/01/2008  . CONTACT DERMATITIS&OTHER ECZEMA DUE UNSPEC CAUSE 11/28/2008  . DIABETES MELLITUS 03/01/2008  . Dysuria 01/04/2010  . GERD 03/01/2008  . HYPERSOMNIA 05/30/2009  . HYPERTENSION 03/01/2008  . INTERNAL HEMORRHOIDS 03/01/2008  . KNEE PAIN, RIGHT 09/05/2009  . NONSPECIFIC ABN FINDNG RAD&OTH EXAM  BILARY TRCT 03/07/2008  . OBESITY 01/10/2010  . PERIPHERAL EDEMA 08/10/2008  . POLYP, GALLBLADDER 03/07/2008  . SMALL BOWEL OBSTRUCTION, HX OF 03/01/2008  . SNORING 05/11/2009  . Stroke ([place]) 04/08/2016   TIA  . THROMBOCYTOSIS 08/18/2008    Assessment:  Anticoag: Xarelto PTA for previous CVAs >> held, SQ hep q8h  I calculate CrCl=50 using ABW and Scr 1.14. Hgb 10.6 low but higher than 12/15.   Goal of Therapy:  Therapeutic oral anticoagulation   Plan:  D/c SQ heparin Resume home Xarelto 15mg  daily   Jaquez Farrington S. Alford Highland, PharmD, BCPS Clinical Staff Pharmacist Eilene Ghazi Stillinger 01/05/2018,3:43 PM

## 2018-01-05 NOTE — Evaluation (Signed)
Physical Therapy Evaluation Patient Details Name: [name] MRN: [id number] DOB: Aug 08, 1932 Today's Date: 01/05/2018   History of Present Illness  84 y.o. female admitted on 01/01/18 for status epilepticus requiring intubation (intubated in ED extubated 01/05/18).  Head CT was negative for acute event.  Neurology following for seizure managment. Hyperglycemia thought to play a role in her current presentation.  She continues to be encephlopathic.  Pt with significant PMH of stroke, obesity, R knee pain, HTN, DM, anemia (iron deficient), and A-fib.  Clinical Impression  Pt was able to stand and preform short distance gait with two person assist.  She is confused and uncoordinated in standing and sitting unsupported.  Family friend in room reports that her daughter plans to take her home with her (pt's daughter), however, daughter works.  Per friend, daughter can arrange for 24/7 care if needed.  I think pt would benefit from CIR level therapies at discharge.  PT to follow acutely for deficits listed below.       Follow Up Recommendations CIR    Equipment Recommendations  Rolling walker with 5" wheels    Recommendations for Other Services Rehab consult     Precautions / Restrictions Precautions Precautions: Fall      Mobility  Bed Mobility               General bed mobility comments: Pt was OOB in the recliner chair.   Transfers Overall transfer level: Needs assistance Equipment used: Rolling walker (2 wheeled) Transfers: Sit to/from Stand Sit to Stand: Mod assist;+2 safety/equipment         General transfer comment: Mod assist to stand from low recliner chair.  Pt with (+) sway even with hand on RW in static standing.   Ambulation/Gait Ambulation/Gait assistance: +2 safety/equipment;Mod assist Gait Distance (Feet): 5 Feet(x2) Assistive device: Rolling walker (2 wheeled) Gait Pattern/deviations: Step-through pattern;Ataxic     General Gait Details: Pt with  uncoordinated gait pattern, wide BOS, hand slipping off of RW on the left.  Two person assist for chair to follow for short distance gait in room.           Balance Overall balance assessment: Needs assistance Sitting-balance support: Feet supported;Bilateral upper extremity supported Sitting balance-Leahy Scale: Poor Sitting balance - Comments: Pt using hands to maintain trunk off of backrest of chair.  When she lets go, she falls posteriorly and to the right.  Postural control: Posterior lean;Right lateral lean Standing balance support: Bilateral upper extremity supported Standing balance-Leahy Scale: Poor Standing balance comment: needs mod external assist in standing.                              Pertinent Vitals/Pain Pain Assessment: No/denies pain    Home Living Family/patient expects to be discharged to:: Private residence Living Arrangements: Alone Available Help at Discharge: Family;Available 24 hours/day;Other (Comment)(plans to go to daughter's, will arrange 24/7) Type of Home: House(daughter's) Home Access: Stairs to enter   Entrance Stairs-Number of Steps: 3 Home Layout: One level Home Equipment: [place] - single point Additional Comments: All info provided by family friend, will have to check accuracy.    Prior Function Level of Independence: Independent with assistive device(s)         Comments: Per chart/RN pt used a cane PTA and drove.     Hand Dominance   Dominant Hand: Right    Extremity/Trunk Assessment   Upper Extremity Assessment Upper Extremity Assessment: Generalized  weakness    Lower Extremity Assessment Lower Extremity Assessment: Generalized weakness(decreased coordination at the legs)    Cervical / Trunk Assessment Cervical / Trunk Assessment: Other exceptions Cervical / Trunk Exceptions: Pt seems to have some coordination issues even at the trunk.   Communication   Communication: Other (comment)(speech is a bit slurred.)   Cognition Arousal/Alertness: Awake/alert Behavior During Therapy: Impulsive Overall Cognitive Status: Impaired/Different from baseline Area of Impairment: Orientation;Memory;Attention;Following commands;Safety/judgement;Awareness;Problem solving                 Orientation Level: Disoriented to;Time;Situation Current Attention Level: Sustained Memory: Decreased short-term memory Following Commands: Follows one step commands inconsistently;Follows one step commands with increased time Safety/Judgement: Decreased awareness of safety;Decreased awareness of deficits Awareness: Emergent Problem Solving: Slow processing;Difficulty sequencing;Requires verbal cues;Requires tactile cues General Comments: Pt's cognition is complicated by being very HOH.  She is slow to process, often needs repetition and has a general sense that she is not as strong as she is normally.               Assessment/Plan    PT Assessment Patient needs continued PT services  PT Problem List Decreased strength;Decreased activity tolerance;Decreased balance;Decreased mobility;Decreased coordination;Decreased cognition;Decreased knowledge of use of DME;Decreased safety awareness;Obesity       PT Treatment Interventions DME instruction;Gait training;Stair training;Functional mobility training;Therapeutic activities;Therapeutic exercise;Balance training;Neuromuscular re-education;Cognitive remediation;Patient/family education    PT Goals (Current goals can be found in the Care Plan section)  Acute Rehab PT Goals Patient Stated Goal: to get home PT Goal Formulation: With patient Time For Goal Achievement: 01/19/18 Potential to Achieve Goals: Good    Frequency Min 3X/week           AM-PAC PT "6 Clicks" Mobility  Outcome Measure Help needed turning from your back to your side while in a flat bed without using bedrails?: A Little Help needed moving from lying on your back to sitting on the side of a flat  bed without using bedrails?: A Lot Help needed moving to and from a bed to a chair (including a wheelchair)?: A Lot Help needed standing up from a chair using your arms (e.g., wheelchair or bedside chair)?: A Lot Help needed to walk in hospital room?: A Lot Help needed climbing 3-5 steps with a railing? : A Lot 6 Click Score: 13    End of Session Equipment Utilized During Treatment: Gait belt;Oxygen Activity Tolerance: Patient tolerated treatment well Patient left: in chair;with call bell/phone within reach;with chair alarm set;with family/visitor present Nurse Communication: Mobility status PT Visit Diagnosis: Muscle weakness (generalized) (M62.81);Difficulty in walking, not elsewhere classified (R26.2);Other symptoms and signs involving the nervous system (O96.295)    Time: 2841-3244 PT Time Calculation (min) (ACUTE ONLY): 24 min   Charges:           Wells Guiles B. Liona Wengert, PT, DPT  Acute Rehabilitation [phone] pager #[phone] office   PT Evaluation $PT Eval Moderate Complexity: 1 Mod PT Treatments $Therapeutic Activity: 8-22 mins        01/05/2018, 5:16 PM

## 2018-01-05 NOTE — Progress Notes (Signed)
Pt given dulcolax supp at 1600 - 1700 sitting on bedpan in chair on bedpan trying to have BM - had 1 small hard BM says she can't get anymore out. Had some bleeding from hemorrhoid from trying to go. Call to Dr Nelda Marseille - ordered more bowel care for pt to take see orders.

## 2018-01-06 DIAGNOSIS — R001 Bradycardia, unspecified: Secondary | ICD-10-CM

## 2018-01-06 DIAGNOSIS — G9340 Encephalopathy, unspecified: Secondary | ICD-10-CM

## 2018-01-06 LAB — CBC
HCT: 30.6 % — ABNORMAL LOW (ref 36.0–46.0)
Hemoglobin: 9.7 g/dL — ABNORMAL LOW (ref 12.0–15.0)
MCH: 26.1 pg (ref 26.0–34.0)
MCHC: 31.7 g/dL (ref 30.0–36.0)
MCV: 82.5 fL (ref 80.0–100.0)
Platelets: 401 10*3/uL — ABNORMAL HIGH (ref 150–400)
RBC: 3.71 MIL/uL — ABNORMAL LOW (ref 3.87–5.11)
RDW: 16 % — ABNORMAL HIGH (ref 11.5–15.5)
WBC: 17.1 10*3/uL — ABNORMAL HIGH (ref 4.0–10.5)
nRBC: 0 % (ref 0.0–0.2)

## 2018-01-06 LAB — CULTURE, BLOOD (ROUTINE X 2)
Culture: NO GROWTH
Culture: NO GROWTH
Special Requests: ADEQUATE
Special Requests: ADEQUATE

## 2018-01-06 LAB — GLUCOSE, CAPILLARY
GLUCOSE-CAPILLARY: 311 mg/dL — AB (ref 70–99)
Glucose-Capillary: 113 mg/dL — ABNORMAL HIGH (ref 70–99)
Glucose-Capillary: 131 mg/dL — ABNORMAL HIGH (ref 70–99)
Glucose-Capillary: 179 mg/dL — ABNORMAL HIGH (ref 70–99)
Glucose-Capillary: 232 mg/dL — ABNORMAL HIGH (ref 70–99)
Glucose-Capillary: 281 mg/dL — ABNORMAL HIGH (ref 70–99)
Glucose-Capillary: 348 mg/dL — ABNORMAL HIGH (ref 70–99)

## 2018-01-06 LAB — BASIC METABOLIC PANEL
Anion gap: 10 (ref 5–15)
BUN: 39 mg/dL — ABNORMAL HIGH (ref 8–23)
CHLORIDE: 107 mmol/L (ref 98–111)
CO2: 22 mmol/L (ref 22–32)
Calcium: 9.1 mg/dL (ref 8.9–10.3)
Creatinine, Ser: 1.15 mg/dL — ABNORMAL HIGH (ref 0.44–1.00)
GFR calc Af Amer: 51 mL/min — ABNORMAL LOW (ref >60)
GFR calc non Af Amer: 44 mL/min — ABNORMAL LOW (ref >60)
Glucose, Bld: 160 mg/dL — ABNORMAL HIGH (ref 70–99)
POTASSIUM: 5 mmol/L (ref 3.5–5.1)
SODIUM: 139 mmol/L (ref 135–145)

## 2018-01-06 LAB — PHOSPHORUS: Phosphorus: 3.1 mg/dL (ref 2.5–4.6)

## 2018-01-06 LAB — MAGNESIUM: Magnesium: 2 mg/dL (ref 1.7–2.4)

## 2018-01-06 MED ORDER — PANTOPRAZOLE SODIUM 40 MG PO TBEC
40.0000 mg | DELAYED_RELEASE_TABLET | Freq: Every day | ORAL | Status: DC
  Administered 2018-01-06 – 2018-01-08 (×3): 40 mg via ORAL
  Filled 2018-01-06 (×3): qty 1

## 2018-01-06 MED ORDER — CLONIDINE HCL 0.2 MG PO TABS
0.2000 mg | ORAL_TABLET | Freq: Two times a day (BID) | ORAL | Status: DC
  Administered 2018-01-06 – 2018-01-09 (×6): 0.2 mg via ORAL
  Filled 2018-01-06 (×6): qty 1

## 2018-01-06 MED ORDER — DILTIAZEM HCL 60 MG PO TABS
30.0000 mg | ORAL_TABLET | Freq: Three times a day (TID) | ORAL | Status: DC
  Administered 2018-01-06 – 2018-01-09 (×9): 30 mg via ORAL
  Filled 2018-01-06 (×9): qty 1

## 2018-01-06 MED ORDER — ENSURE ENLIVE PO LIQD
237.0000 mL | Freq: Two times a day (BID) | ORAL | Status: DC
  Administered 2018-01-06 – 2018-01-07 (×2): 237 mL via ORAL

## 2018-01-06 NOTE — Evaluation (Signed)
Clinical/Bedside Swallow Evaluation Patient Details  Name: [name] MRN: [id number] Date of Birth: 07/15/1932  Today's Date: 01/06/2018 Time: SLP Start Time (ACUTE ONLY): 0911 SLP Stop Time (ACUTE ONLY): 0927 SLP Time Calculation (min) (ACUTE ONLY): 16 min  Past Medical History:  Past Medical History:  Diagnosis Date  . A-fib [facility]) 2019   Per patients daughter, [name] [phone]  . ANEMIA, IRON DEFICIENCY 08/11/2008  . ANGIOEDEMA 03/01/2008  . CEREBROVASCULAR ACCIDENT, HX OF 03/01/2008  . CONTACT DERMATITIS&OTHER ECZEMA DUE UNSPEC CAUSE 11/28/2008  . DIABETES MELLITUS 03/01/2008  . Dysuria 01/04/2010  . GERD 03/01/2008  . HYPERSOMNIA 05/30/2009  . HYPERTENSION 03/01/2008  . INTERNAL HEMORRHOIDS 03/01/2008  . KNEE PAIN, RIGHT 09/05/2009  . NONSPECIFIC ABN FINDNG RAD&OTH EXAM BILARY TRCT 03/07/2008  . OBESITY 01/10/2010  . PERIPHERAL EDEMA 08/10/2008  . POLYP, GALLBLADDER 03/07/2008  . SMALL BOWEL OBSTRUCTION, HX OF 03/01/2008  . SNORING 05/11/2009  . Stroke ([place]) 04/08/2016   TIA  . THROMBOCYTOSIS 08/18/2008   Past Surgical History:  Past Surgical History:  Procedure Laterality Date  . HERNIA REPAIR  09/14/08   with release SBO-Dr. Dalbert Batman  . SMALL INTESTINE SURGERY    . 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 female admitted for status epilepticus requiring intubation 12/12-12/16.  MRI negative for acute changes. Pt with significant PMH of stroke, obesity, GERD, SBO, HTN, DM, anemia (iron deficient), and A-fib.   Assessment / Plan / Recommendation Clinical Impression  Pt's voice is mildly soft but clear in quality s/p extubation on previous date. She does have intermittent throat clearing across thin liquids and purees from full liquid tray, but per RN, pt drank 3 oz of water consecutively with her without any coughing. Pt did not consume this much volume with SLP. Question if GERD could also be playing a role as pt  talks about using cough drops at home for throat clearing often (but also not the clearest historian). Discussed with RN - would monitor closely today with low threshold for proceeding with instrumental testing should she demonstrate any additional difficulties. Will f/u for tolerance. SLP Visit Diagnosis: Dysphagia, unspecified (R13.10)    Aspiration Risk  Mild aspiration risk;Moderate aspiration risk    Diet Recommendation Other (Comment)(pt on full liquid diet)   Liquid Administration via: Cup;Straw Medication Administration: Whole meds with puree Supervision: Patient able to self feed;Full supervision/cueing for compensatory strategies Compensations: Slow rate;Small sips/bites;Minimize environmental distractions Postural Changes: Seated upright at 90 degrees    Other  Recommendations Oral Care Recommendations: Oral care BID   Follow up Recommendations (tba)      Frequency and Duration min 2x/week  2 weeks       Prognosis Prognosis for Safe Diet Advancement: Good      Swallow Study   General HPI: Pt is an 84 y.o. female admitted for status epilepticus requiring intubation 12/12-12/16.  MRI negative for acute changes. Pt with significant PMH of stroke, obesity, GERD, SBO, HTN, DM, anemia (iron deficient), and A-fib. Type of Study: Bedside Swallow Evaluation Previous Swallow Assessment: none in chart Diet Prior to this Study: Thin liquids;Other (Comment)(full liquid diet) Temperature Spikes Noted: No Respiratory Status: Room air History of Recent Intubation: Yes Length of Intubations (days): 5 days Date extubated: 01/05/18 Behavior/Cognition: Alert;Cooperative;Other (Comment)(delayed responses) Oral Care Completed by SLP: No Vision: Functional for self-feeding Self-Feeding Abilities: Able to feed self;Needs set up Patient Positioning: Upright in  chair Baseline Vocal Quality: Low vocal intensity(mild)    Oral/Motor/Sensory Function     Ice Chips Ice chips: Not tested    Thin Liquid Thin Liquid: Impaired Presentation: Cup;Self Fed;Straw Pharyngeal  Phase Impairments: Throat Clearing - Immediate;Throat Clearing - Delayed    Nectar Thick Nectar Thick Liquid: Not tested   Honey Thick Honey Thick Liquid: Not tested   Puree Puree: Impaired Presentation: Self Fed;Spoon Pharyngeal Phase Impairments: Throat Clearing - Immediate;Throat Clearing - Delayed   Solid     Solid: Not tested      Germain Osgood 01/06/2018,9:42 AM   Germain Osgood, M.A. [place] Acute Environmental education officer [phone] Office [phone]

## 2018-01-06 NOTE — Progress Notes (Signed)
Nutrition Follow-up  DOCUMENTATION CODES:   Obesity unspecified  INTERVENTION:   Ensure Enlive po BID, each supplement provides 350 kcal and 20 grams of protein  Diet advancement as tolerated to Carb Modified   NUTRITION DIAGNOSIS:   Inadequate oral intake related to acute illness as evidenced by NPO status.  Being addressed as diet advanced post extubation, taking po, supplements  GOAL:   Patient will meet greater than or equal to 90% of their needs  Progressing  MONITOR:   PO intake, Diet advancement, Supplement acceptance, Labs, Weight trends  REASON FOR ASSESSMENT:   Ventilator    ASSESSMENT:   84 yo female admitted with status epilepticus requiring intubation. PMH includes DM, HTN, GERD, CVA, SBO  12/16 Extubated  Diet advanced to FL; pt tolerating and ate well this AM. Pt reports she takes a protein supplement daily at home with 30g pf protein (not sure if it is Premier Protein or something similar). Pt agreeable to Ensure Enlive while in hospital  Current wt 89.5 kg; weight of 84.1 kg on admission.. Net +5 L since admission  Labs: CBGs 113-311, Creatinine 1.15, BUN 39 Meds: ss novolog, levemir  Diet Order:   Diet Order            Diet full liquid Room service appropriate? Yes; Fluid consistency: Thin  Diet effective now              EDUCATION NEEDS:   Not appropriate for education at this time  Skin:  Skin Assessment: Skin Integrity Issues: Skin Integrity Issues:: Other (Comment) Other: MASD: abdomen, groin  Last BM:  12/16  Height:   Ht Readings from Last 1 Encounters:  01/01/18 5\' 1"  (1.549 m)    Weight:   Wt Readings from Last 1 Encounters:  01/06/18 89.5 kg    Ideal Body Weight:  47.7 kg  BMI:  Body mass index is 37.28 kg/m.  Estimated Nutritional Needs:   Kcal:  1500-1700 kcals   Protein:  85-100 g  Fluid:  >/= 1.5 L   Kerman Passey MS, RD, LDN, CNSC [phone] Pager  [phone] Weekend/On-Call Pager

## 2018-01-06 NOTE — Progress Notes (Signed)
Occupational Therapy Evaluation Patient Details Name: [name] MRN: [id number] DOB: Jun 19, 1932 Today's Date: 01/06/2018    History of Present Illness 84 y.o. female admitted on 01/01/18 for status epilepticus requiring intubation (intubated in ED extubated 01/05/18).  Head CT was negative for acute event.  Neurology following for seizure managment. Hyperglycemia thought to play a role in her current presentation.  She continues to be encephlopathic.  Pt with significant PMH of stroke, obesity, R knee pain, HTN, DM, anemia (iron deficient), and A-fib.   Clinical Impression   PT admitted with see above. Pt currently with functional limitiations due to the deficits listed below (see OT problem list). Pt currently requires min (A) with RW and demonstrates cognitive deficits. Questions ability to qualify for [facility] homes first program. Message left with case manager  Kristi regarding this recommendation. Pt will require (A) upon d/c home initially and if family are unable to provide recommend SNF. Pt will benefit from skilled OT to increase their independence and safety with adls and balance to allow discharge home first program.     Follow Up Recommendations  Other (comment)(home first with [facility] )    Equipment Recommendations  3 in 1 bedside commode;Other (comment)(RW)    Recommendations for Other Services       Precautions / Restrictions Precautions Precautions: Fall Precaution Comments: seizure Restrictions Weight Bearing Restrictions: No      Mobility Bed Mobility               General bed mobility comments: in chair on arrival  Transfers Overall transfer level: Needs assistance Equipment used: Rolling walker (2 wheeled) Transfers: Sit to/from Stand Sit to Stand: Min assist         General transfer comment: needs cues for hand placement and safety    Balance Overall balance assessment: Needs assistance Sitting-balance support: Feet supported;Bilateral upper  extremity supported Sitting balance-Leahy Scale: Poor   Postural control: Posterior lean Standing balance support: Bilateral upper extremity supported Standing balance-Leahy Scale: Poor Standing balance comment: needs mod external assist in standing.                            ADL either performed or assessed with clinical judgement   ADL Overall ADL's : Needs assistance/impaired Eating/Feeding: Set up;Sitting   Grooming: Wash/dry face;Set up;Sitting       Lower Body Bathing: Moderate assistance;Sit to/from stand           Toilet Transfer: Minimal Insurance claims handler Details (indicate cue type and reason): pt walking into surfaces with RW on R and L side without awareness Toileting- Clothing Manipulation and Hygiene: Minimal assistance       Functional mobility during ADLs: Minimal assistance;Rolling walker General ADL Comments: pt requires redirection throughout session     Vision   Additional Comments: no glasses and able to read the bible correctly      Perception     Praxis      Pertinent Vitals/Pain Pain Assessment: No/denies pain Faces Pain Scale: No hurt     Hand Dominance Right   Extremity/Trunk Assessment Upper Extremity Assessment Upper Extremity Assessment: Overall WFL for tasks assessed           Communication Communication Communication: Other (comment)(speech is a bit slurred.)   Cognition Arousal/Alertness: Awake/alert Behavior During Therapy: Impulsive Overall Cognitive Status: Impaired/Different from baseline Area of Impairment: Orientation;Memory;Attention;Following commands;Safety/judgement;Awareness;Problem solving  Orientation Level: Disoriented to;Time;Situation Current Attention Level: Sustained Memory: Decreased short-term memory Following Commands: Follows one step commands inconsistently;Follows one step commands with increased time Safety/Judgement: Decreased awareness of  safety;Decreased awareness of deficits Awareness: Emergent Problem Solving: Slow processing;Difficulty sequencing;Requires verbal cues;Requires tactile cues General Comments: Pt's cognition is complicated by being very HOH.  She is slow to process, often needs repetition. pt reading bible but unable to state any information about the palsm verse that she is reading. pt reports that she went to the grocery store this morning. grandson present and correcting patient   General Comments       Exercises     Shoulder Instructions      Home Living Family/patient expects to be discharged to:: Private residence Living Arrangements: Alone Available Help at Discharge: Family;Available 24 hours/day;Other (Comment)(plans to go to daughter's, will arrange 24/7) Type of Home: House(daughter's) Home Access: Stairs to enter Entrance Stairs-Number of Steps: 3   Home Layout: One level               Home Equipment: [place] - single point   Additional Comments: information gathered from chart review. question if patient is reliable       Prior Functioning/Environment Level of Independence: Independent with assistive device(s)        Comments: Per chart/RN pt used a cane PTA and drove.        OT Problem List: Decreased strength;Decreased activity tolerance;Impaired balance (sitting and/or standing);Decreased cognition;Decreased safety awareness;Decreased knowledge of use of DME or AE;Decreased knowledge of precautions;Cardiopulmonary status limiting activity      OT Treatment/Interventions: Self-care/ADL training;Therapeutic exercise;Neuromuscular education;Energy conservation;DME and/or AE instruction;Manual therapy;Modalities;Therapeutic activities;Cognitive remediation/compensation;Patient/family education;Balance training    OT Goals(Current goals can be found in the care plan section) Acute Rehab OT Goals Patient Stated Goal: read bible OT Goal Formulation: Patient unable to participate  in goal setting Time For Goal Achievement: 01/20/18 Potential to Achieve Goals: Good  OT Frequency: Min 2X/week   Barriers to D/C: Decreased caregiver support          Co-evaluation              AM-PAC OT "6 Clicks" Daily Activity     Outcome Measure Help from another person eating meals?: A Little Help from another person taking care of personal grooming?: A Lot Help from another person toileting, which includes using toliet, bedpan, or urinal?: A Lot Help from another person bathing (including washing, rinsing, drying)?: A Lot Help from another person to put on and taking off regular upper body clothing?: A Lot Help from another person to put on and taking off regular lower body clothing?: A Lot 6 Click Score: 13   End of Session Equipment Utilized During Treatment: Gait belt;Rolling walker Nurse Communication: Mobility status;Precautions  Activity Tolerance: Patient tolerated treatment well Patient left: in chair;with call bell/phone within reach;with chair alarm set;with family/visitor present;with nursing/sitter in room  OT Visit Diagnosis: Unsteadiness on feet (R26.81);Muscle weakness (generalized) (M62.81)                Time: 1027-2536 OT Time Calculation (min): 17 min Charges:  OT General Charges $OT Visit: 1 Visit OT Evaluation $OT Eval Moderate Complexity: 1 Mod   Jeri Modena, OTR/L  Acute Rehabilitation Services Pager: [phone] Office: [phone] .   Jeri Modena 01/06/2018, 1:22 PM

## 2018-01-06 NOTE — Progress Notes (Signed)
6am: 60 mg Cardizem not given due to bradycardia. Pt HR 48-65. [place] notified.

## 2018-01-06 NOTE — Progress Notes (Signed)
Pt admitted to the unit from 85M. Pt currently a/o X 4, showing no signs of distress. Skin assessment complete with Cyril Mourning RN, no skin issues noted upon assessment. Pt does have healed over incision, midline of abdomen, from previous surgery. RN to continue to monitor.

## 2018-01-06 NOTE — Consult Note (Signed)
Physical Medicine and Rehabilitation Consult Reason for Consult:  Decreased functional mobility Referring Physician: Dr. Lamonte Sakai   HPI: [name] is a 84 y.o.right handed female  With history of diabetes mellitus, hypertension, CVA, atrial fibrillation maintained on xarelto as well as history of small bowel obstruction. Per chart review patient lives alone. Independent with assistive device prior to admission one level home. Plans to stay with her daughter and assistance as needed on discharge. Presented 01/01/2018 with slurred speech as well as question seizure.CT/MRI showed no acute intracranial process. 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  Noted blood sugar 596 and blood pressure in the 200s. EEG with no seizures. Findings consistent with encephalopathy. Patient was loaded with Keppra for seizure prophylaxis. She was extubated 01/05/2018 with therapy evaluation completed 01/05/2018 and recommendations for physical medicine rehabilitation consult.   Review of Systems  Constitutional: Negative for chills and fever.  HENT: Negative for hearing loss.   Eyes: Negative for blurred vision and double vision.  Respiratory: Negative for cough and shortness of breath.   Cardiovascular: Positive for palpitations and leg swelling.  Gastrointestinal: Positive for constipation. Negative for nausea and vomiting.       GERD  Genitourinary: Positive for dysuria. Negative for flank pain and hematuria.  Psychiatric/Behavioral: The patient has insomnia.   All other systems reviewed and are negative.  Past Medical History:  Diagnosis Date  . A-fib [facility]) 2019   Per patients daughter, [name] [phone]  . ANEMIA, IRON DEFICIENCY 08/11/2008  . ANGIOEDEMA 03/01/2008  . CEREBROVASCULAR ACCIDENT, HX OF 03/01/2008  . CONTACT DERMATITIS&OTHER ECZEMA DUE UNSPEC CAUSE 11/28/2008  . DIABETES MELLITUS 03/01/2008  . Dysuria 01/04/2010  . GERD 03/01/2008  . HYPERSOMNIA 05/30/2009  . HYPERTENSION 03/01/2008  .  INTERNAL HEMORRHOIDS 03/01/2008  . KNEE PAIN, RIGHT 09/05/2009  . NONSPECIFIC ABN FINDNG RAD&OTH EXAM BILARY TRCT 03/07/2008  . OBESITY 01/10/2010  . PERIPHERAL EDEMA 08/10/2008  . POLYP, GALLBLADDER 03/07/2008  . SMALL BOWEL OBSTRUCTION, HX OF 03/01/2008  . SNORING 05/11/2009  . Stroke ([place]) 04/08/2016   TIA  . THROMBOCYTOSIS 08/18/2008   Past Surgical History:  Procedure Laterality Date  . HERNIA REPAIR  09/14/08   with release [facility]-Dr. Dalbert Batman  . SMALL INTESTINE SURGERY    . 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 Diabetes Mother   . Heart disease Mother   . Diabetes Sister   . Heart disease Sister   . Colitis Sister   . Kidney disease Brother   . Breast cancer Other        Neice  . Allergies Other        Nephrew  . Asthma Other        Nephrew   Social History:  reports that she has never smoked. She has never used smokeless tobacco. She reports that she does not drink alcohol or use drugs. Allergies:  Allergies  Allergen Reactions  . Ace Inhibitors Swelling    Angioedema   . Prednisone Nausea And Vomiting, Other (See Comments) and Hypertension    Blood sugar increased (also)  . Sulfa Antibiotics Hives  . Penicillins Rash    Has patient had a PCN reaction causing immediate rash, facial/tongue/throat swelling, SOB or lightheadedness with hypotension: Yes Has patient had a PCN reaction causing severe rash involving mucus membranes or skin necrosis: No Has patient had a PCN reaction that required hospitalization:  No Has patient had a PCN reaction occurring within the last 10 years: No If all of the above answers are "NO", then may proceed with Cephalosporin use.   . Sulfamethoxazole Rash  . Sulfonamide Derivatives Rash   Medications Prior to Admission  Medication Sig Dispense Refill  . Cholecalciferol (VITAMIN D3 PO) Take 1 tablet by mouth daily with lunch.    . cloNIDine  (CATAPRES) 0.2 MG tablet Take 1 tablet (0.2 mg total) by mouth 2 (two) times daily. (Patient taking differently: Take 0.2 mg by mouth 3 (three) times daily. ) 60 tablet 3  . diltiazem (CARDIZEM CD) 180 MG 24 hr capsule Take 1 capsule (180 mg total) by mouth daily. 30 capsule 3  . feeding supplement, GLUCERNA SHAKE, (GLUCERNA SHAKE) LIQD Take 237 mLs by mouth See admin instructions. Drink one can (237 mls) by mouth once or twice daily as meal replacement    . Insulin Detemir (LEVEMIR FLEXTOUCH) 100 UNIT/ML Pen Inject 18 Units into the skin 2 (two) times daily. Inject 20 units subcutaneously twice daily - after lunch and supper (Patient taking differently: Inject 15-23 Units into the skin See admin instructions. Use 25 units every morning and use 18 units every evening) 15 mL 11  . IRON PO Take 325 mg by mouth daily with lunch.     . losartan (COZAAR) 100 MG tablet Take 100 mg by mouth daily.    . Multiple Vitamin (MULTIVITAMIN WITH MINERALS) TABS tablet Take 1 tablet by mouth daily.    Marland Kitchen omeprazole (PRILOSEC OTC) 20 MG tablet Take 20 mg by mouth daily with breakfast.     . polycarbophil (FIBERCON) 625 MG tablet Take 625 mg by mouth daily as needed for mild constipation.    . Rivaroxaban (XARELTO) 15 MG TABS tablet Take 1 tablet (15 mg total) by mouth daily with supper. 42 tablet 3  . spironolactone (ALDACTONE) 25 MG tablet Take 12.5 mg by mouth daily.   3  . vitamin B-12 (CYANOCOBALAMIN) 500 MCG tablet Take 500 mcg by mouth daily with lunch.    . metFORMIN (GLUCOPHAGE) 500 MG tablet Take 500 mg by mouth daily.  3    Home: Home Living Family/patient expects to be discharged to:: Private residence Living Arrangements: Alone Available Help at Discharge: Family, Available 24 hours/day, Other (Comment)(plans to go to daughter's, will arrange 24/7) Type of Home: House(daughter's) Home Access: Stairs to enter Entrance Stairs-Number of Steps: 3 Home Layout: One level Home Equipment: [place] - single  point Additional Comments: information gathered from chart review. question if patient is reliable   Functional History: Prior Function Level of Independence: Independent with assistive device(s) Comments: Per chart/RN pt used a cane PTA and drove. Functional Status:  Mobility: Bed Mobility General bed mobility comments: in chair on arrival Transfers Overall transfer level: Needs assistance Equipment used: Rolling walker (2 wheeled) Transfers: Sit to/from Stand Sit to Stand: Min assist General transfer comment: needs cues for hand placement and safety Ambulation/Gait Ambulation/Gait assistance: +2 safety/equipment, Mod assist Gait Distance (Feet): 5 Feet(x2) Assistive device: Rolling walker (2 wheeled) Gait Pattern/deviations: Step-through pattern, Ataxic General Gait Details: Pt with uncoordinated gait pattern, wide BOS, hand slipping off of RW on the left.  Two person assist for chair to follow for short distance gait in room.      ADL: ADL Overall ADL's : Needs assistance/impaired Eating/Feeding: Set up, Sitting Grooming: Wash/dry face, Set up, Sitting Lower Body Bathing: Moderate assistance, Sit to/from stand Toilet Transfer: Minimal assistance, Surveyor, mining  Details (indicate cue type and reason): pt walking into surfaces with RW on R and L side without awareness Toileting- Clothing Manipulation and Hygiene: Minimal assistance Functional mobility during ADLs: Minimal assistance, Rolling walker General ADL Comments: pt requires redirection throughout session  Cognition: Cognition Overall Cognitive Status: Impaired/Different from baseline Orientation Level: Oriented to person, Oriented to place Cognition Arousal/Alertness: Awake/alert Behavior During Therapy: Impulsive Overall Cognitive Status: Impaired/Different from baseline Area of Impairment: Orientation, Memory, Attention, Following commands, Safety/judgement, Awareness, Problem solving Orientation Level:  Disoriented to, Time, Situation Current Attention Level: Sustained Memory: Decreased short-term memory Following Commands: Follows one step commands inconsistently, Follows one step commands with increased time Safety/Judgement: Decreased awareness of safety, Decreased awareness of deficits Awareness: Emergent Problem Solving: Slow processing, Difficulty sequencing, Requires verbal cues, Requires tactile cues General Comments: Pt's cognition is complicated by being very HOH.  She is slow to process, often needs repetition. pt reading bible but unable to state any information about the palsm verse that she is reading. pt reports that she went to the grocery store this morning. grandson present and correcting patient  Blood pressure (!) 135/57, pulse 71, temperature 98.1 F (36.7 C), temperature source Oral, resp. rate 16, height 5\' 1"  (1.549 m), weight 89.5 kg, SpO2 99 %. Physical Exam  Neurological:  Patient is alert sitting up in bed. She provides her age as well as her upcoming holiday Christmas. Follow simple commands    Results for orders placed or performed during the hospital encounter of 01/01/18 (from the past 24 hour(s))  Glucose, capillary     Status: Abnormal   Collection Time: 01/05/18  2:07 PM  Result Value Ref Range   Glucose-Capillary 257 (H) 70 - 99 mg/dL  Glucose, capillary     Status: Abnormal   Collection Time: 01/05/18  2:58 PM  Result Value Ref Range   Glucose-Capillary 262 (H) 70 - 99 mg/dL  Glucose, capillary     Status: Abnormal   Collection Time: 01/05/18  8:33 PM  Result Value Ref Range   Glucose-Capillary 172 (H) 70 - 99 mg/dL  Glucose, capillary     Status: Abnormal   Collection Time: 01/06/18 12:19 AM  Result Value Ref Range   Glucose-Capillary 179 (H) 70 - 99 mg/dL  CBC     Status: Abnormal   Collection Time: 01/06/18  2:59 AM  Result Value Ref Range   WBC 17.1 (H) 4.0 - 10.5 K/uL   RBC 3.71 (L) 3.87 - 5.11 MIL/uL   Hemoglobin 9.7 (L) 12.0 - 15.0  g/dL   HCT 30.6 (L) 36.0 - 46.0 %   MCV 82.5 80.0 - 100.0 fL   MCH 26.1 26.0 - 34.0 pg   MCHC 31.7 30.0 - 36.0 g/dL   RDW 16.0 (H) 11.5 - 15.5 %   Platelets 401 (H) 150 - 400 K/uL   nRBC 0.0 0.0 - 0.2 %  Basic metabolic panel     Status: Abnormal   Collection Time: 01/06/18  2:59 AM  Result Value Ref Range   Sodium 139 135 - 145 mmol/L   Potassium 5.0 3.5 - 5.1 mmol/L   Chloride 107 98 - 111 mmol/L   CO2 22 22 - 32 mmol/L   Glucose, Bld 160 (H) 70 - 99 mg/dL   BUN 39 (H) 8 - 23 mg/dL   Creatinine, Ser 1.15 (H) 0.44 - 1.00 mg/dL   Calcium 9.1 8.9 - 10.3 mg/dL   GFR calc non Af Amer 44 (L) >60 mL/min   GFR  calc Af Amer 51 (L) >60 mL/min   Anion gap 10 5 - 15  Magnesium     Status: None   Collection Time: 01/06/18  2:59 AM  Result Value Ref Range   Magnesium 2.0 1.7 - 2.4 mg/dL  Phosphorus     Status: None   Collection Time: 01/06/18  2:59 AM  Result Value Ref Range   Phosphorus 3.1 2.5 - 4.6 mg/dL  Glucose, capillary     Status: Abnormal   Collection Time: 01/06/18  4:02 AM  Result Value Ref Range   Glucose-Capillary 131 (H) 70 - 99 mg/dL  Glucose, capillary     Status: Abnormal   Collection Time: 01/06/18  8:31 AM  Result Value Ref Range   Glucose-Capillary 113 (H) 70 - 99 mg/dL  Glucose, capillary     Status: Abnormal   Collection Time: 01/06/18 11:34 AM  Result Value Ref Range   Glucose-Capillary 311 (H) 70 - 99 mg/dL   No results found.   Assessment/Plan: Diagnosis: Functional deficits due to seizure and encephalopathy.   1. Does the need for close, 24 hr/day medical supervision in concert with the patient's rehab needs make it unreasonable for this patient to be served in a less intensive setting? Yes 2. Co-Morbidities requiring supervision/potential complications: HTN, DM 3. Due to bladder management, bowel management, safety, skin/wound care, disease management, medication administration, pain management and patient education, does the patient require 24 hr/day  rehab nursing? Yes 4. Does the patient require coordinated care of a physician, rehab nurse, PT (1-2 hrs/day, 5 days/week), OT (1-2 hrs/day, 5 days/week) and SLP (1-2 hrs/day, 5 days/week) to address physical and functional deficits in the context of the above medical diagnosis(es)? Yes Addressing deficits in the following areas: balance, endurance, locomotion, strength, transferring, bowel/bladder control, bathing, dressing, feeding, grooming, toileting, cognition and psychosocial support 5. Can the patient actively participate in an intensive therapy program of at least 3 hrs of therapy per day at least 5 days per week? Yes 6. The potential for patient to make measurable gains while on inpatient rehab is good 7. Anticipated functional outcomes upon discharge from inpatient rehab are modified independent and supervision  with PT, modified independent and supervision with OT, modified independent and supervision with SLP. 8. Estimated rehab length of stay to reach the above functional goals is: 10-14 days 9. Anticipated D/C setting: Home 10. Anticipated post D/C treatments: [place] therapy 11. Overall Rehab/Functional Prognosis: excellent  RECOMMENDATIONS: This patient's condition is appropriate for continued rehabilitative care in the following setting: CIR Patient has agreed to participate in recommended program. Yes Note that insurance prior authorization may be required for reimbursement for recommended care.  Comment: Rehab Admissions Coordinator to follow up.  Thanks,  Meredith Staggers, MD, Mellody Drown  I have personally performed a face to face diagnostic evaluation of this patient. Additionally, I have reviewed and concur with the physician assistant's documentation above.    Meredith Staggers, MD 01/06/2018

## 2018-01-06 NOTE — Progress Notes (Signed)
NAME:  [name], MRN:  [id number], DOB:  07-20-1932, LOS: 5 ADMISSION DATE:  01/01/2018, CONSULTATION DATE:  12/12 REFERRING MD:  Dr. Kathrynn Humble, CHIEF COMPLAINT:  Seizure   Brief History   84 year old female with past medical history of diabetes and hypertension admitted 12/12 for status epilepticus requiring intubation.  History of present illness   84 year old female with past medical history as below, which is significant for diabetes, hypertension, stroke, and small bowel obstruction.  Symptoms started 12/11 when she developed slurred speech while at the bank.  She had several episodes of this causing her to present to the emergency department in the evening hours of 12/11.  Work-up including CT scan and MRI were negative and she was ultimately discharged home.  Then in the early morning hours of 12/12 she was found to be unresponsive by her grandson.  EMS was called and in route to the emergency department she experienced seizure-like activity.  She had another witnessed seizure in the emergency department and was intubated for airway protection.  Repeat CT of the head was nonacute.  Neurology was consulted in the emergency department and has made recommendations for antiepileptic medications and started propofol infusion.  Past Medical History   has a past medical history of A-fib ([place]) (2019), ANEMIA, IRON DEFICIENCY (08/11/2008), ANGIOEDEMA (03/01/2008), CEREBROVASCULAR ACCIDENT, HX OF (03/01/2008), CONTACT DERMATITIS&OTHER ECZEMA DUE UNSPEC CAUSE (11/28/2008), DIABETES MELLITUS (03/01/2008), Dysuria (01/04/2010), GERD (03/01/2008), HYPERSOMNIA (05/30/2009), HYPERTENSION (03/01/2008), INTERNAL HEMORRHOIDS (03/01/2008), KNEE PAIN, RIGHT (09/05/2009), NONSPECIFIC ABN FINDNG RAD&OTH EXAM BILARY TRCT (03/07/2008), OBESITY (01/10/2010), PERIPHERAL EDEMA (08/10/2008), POLYP, GALLBLADDER (03/07/2008), SMALL BOWEL OBSTRUCTION, HX OF (03/01/2008), SNORING (05/11/2009), Stroke ([place]) (04/08/2016), and THROMBOCYTOSIS  (08/18/2008).   Significant Hospital Events   12/11 to ED for slurred speech/TIA. Discharged 12/12 to ED found down. Seizures. Intubated  12/14 Cervical spine cleared, no further seizure activity.  PSV  12/15 No cuff leak on exam  Consults:  Neurology  Procedures:  ETT 12/12 >>12/16  Significant Diagnostic Tests:  CT head 12/11 > No CT evidence for acute intracranial abnormality. Atrophy and small vessel ischemic changes of the white matter. MRI brain 12/11 > No acute intracranial process. Multiple old cerebellar infarcts. Moderate to severe chronic small vessel ischemic changes. CT head Cspine 12/13 > No acute intracranial hemorrhage. No acute/traumatic cervical spine pathology. 01/03/2018 MRI of the head plan>> no features to suggest PRES 01/01/2018 continuous EEG>> no evidence of seizure activity  Micro Data:  Sputum 12/12 >> negative  BCx2 12/12 >>   Antimicrobials:     Interim history/subjective:  Episode of bradycardia overnight and cardizem was held  Objective   Blood pressure (!) 149/76, pulse 71, temperature 98.1 F (36.7 C), temperature source Oral, resp. rate 16, height 5\' 1"  (1.549 m), weight 89.5 kg, SpO2 99 %.    Vent Mode: CPAP;PSV FiO2 (%):  [30 %] 30 % PEEP:  [5 cmH20] 5 cmH20 Pressure Support:  [5 cmH20] 5 cmH20   Intake/Output Summary (Last 24 hours) at 01/06/2018 1028 Last data filed at 01/06/2018 0600 Gross per 24 hour  Intake 680 ml  Output 1555 ml  Net -875 ml   Filed Weights   01/04/18 0500 01/05/18 0223 01/06/18 0409  Weight: 84.3 kg 85.1 kg 89.5 kg   Examination: General: Elderly female, mildly confused, chronically ill appearing HEENT: [place]/AT, PERRL, EOM-I and MMM Neuro: Awake and interactive but not oriented to place or time, moving all ext to command CV: RRR, Nl S1/S2 and -M/R/G PULM: Bibasilar crackles GI: Soft, NT,  ND and +BS Extremities: -edema and -tenderness Skin: Intact  I reviewed CXR myself, no acute disease  noted.  Resolved Hospital Problem list     Assessment & Plan:   Status epilepticus  -No seizure history.  No evidence of press on repeat MRI 12/13 P: - Maintain on Keppra 500 PO BID - Ensure BP control continue clonidine, cardizem for BP control  - PRN labetalol  History of CVA on Xarelto P: - Xarelto PO  Inability to protect airway  -in setting of status epilepticus  P: - Titrate O2 for sat of 88-92% - IS per RT protocol - Ambulate - SLP passed - Monitor for airway protection   Hyperglycemia -history of DM P: - Continue levemir 20 units BID  - CBG - ISS  Hypertensive urgency -initial concern for PRES but not described on imaging, including MRI from 12/11 or 12/13 P: - Continue clonidine, diltiazem and PRN labetalol for BP control   Metabolic acidosis with elevated anion gap - Lactic acid 13 P: - Monitor/resolved  - KVO IVF  Constipation  P: - Dulcolax PRN - Miralax QD  Transfer to tele and to [employer] service with PCCM off 12/18.  Discussed with TRH-MD  Best practice:  Diet: NPO Pain/Anxiety/Delirium protocol (if indicated): in place VAP protocol (if indicated): Yes DVT prophylaxis: sq heparin GI prophylaxis: protonix Glucose control: SS  Mobility: BR Code Status: FULL Family Communication: Updated family 12/14 at bedside Disposition: ICU, critically ill.   Labs   CBC: Recent Labs  Lab 12/31/17 1724  01/01/18 0515  01/02/18 0612 01/03/18 4098 01/04/18 0314 01/05/18 0624 01/06/18 0259  WBC 9.2  --  14.7*   < > 13.4* 14.0* 9.1 11.7* 17.1*  NEUTROABS 3.6  --  6.4  --  9.7* 8.9*  --   --   --   HGB 12.4   < > 12.8   < > 12.1 11.4* 9.4* 10.6* 9.7*  HCT 39.3   < > 42.2   < > 37.9 36.2 32.9* 34.0* 30.6*  MCV 83.6  --  87.0   < > 82.6 84.6 78.5* 83.5 82.5  PLT 359  --  386   < > 370 320 248 376 401*   < > = values in this interval not displayed.    Basic Metabolic Panel: Recent Labs  Lab 01/02/18 0612 01/02/18 1849 01/03/18 0651  01/04/18 0314 01/05/18 0318 01/06/18 0259  NA 135  --  137 138 138 139  K 3.9  --  4.3 4.1 5.4* 5.0  CL 104  --  106 105 107 107  CO2 20*  --  21* 23 18* 22  GLUCOSE 291*  --  302* 180* 290* 160*  BUN 19  --  20 22 29* 39*  CREATININE 1.23*  --  1.28* 1.03* 1.14* 1.15*  CALCIUM 8.7*  --  8.5* 8.8* 9.0 9.1  MG 1.8 1.9 1.9 2.0 1.8 2.0  PHOS 2.6 2.6 3.2 3.5 3.5 3.1   GFR: Estimated Creatinine Clearance: 37.7 mL/min (A) (by C-G formula based on SCr of 1.15 mg/dL (H)). Recent Labs  Lab 01/01/18 0520 01/01/18 0853 01/02/18 0612 01/03/18 0651 01/04/18 0314 01/05/18 0624 01/06/18 0259  PROCALCITON  --  0.28 0.72 0.47  --   --   --   WBC  --  14.8* 13.4* 14.0* 9.1 11.7* 17.1*  LATICACIDVEN 13.28* 2.8* 0.9  --   --   --   --     Liver Function Tests: Recent Labs  Lab 12/31/17 1724 01/01/18 0515  AST 16 25  ALT 17 17  ALKPHOS 94 92  BILITOT 0.4 0.5  PROT 7.7 7.9  ALBUMIN 3.5 3.8   No results for input(s): LIPASE, AMYLASE in the last 168 hours. No results for input(s): AMMONIA in the last 168 hours.  ABG    Component Value Date/Time   PHART 7.363 01/03/2018 0536   PCO2ART 40.5 01/03/2018 0536   PO2ART 104.0 01/03/2018 0536   HCO3 23.0 01/03/2018 0536   TCO2 24 01/03/2018 0536   ACIDBASEDEF 2.0 01/03/2018 0536   O2SAT 98.0 01/03/2018 0536     Coagulation Profile: Recent Labs  Lab 12/31/17 1724  INR 1.18    Cardiac Enzymes: Recent Labs  Lab 01/01/18 0515  CKTOTAL 59    HbA1C: Hgb A1c MFr Bld  Date/Time Value Ref Range Status  01/01/2018 08:53 AM 13.0 (H) 4.8 - 5.6 % Final    Comment:    (NOTE) Pre diabetes:          5.7%-6.4% Diabetes:              >6.4% Glycemic control for   <7.0% adults with diabetes   07/26/2017 12:03 AM 10.6 (H) 4.8 - 5.6 % Final    Comment:    (NOTE) Pre diabetes:          5.7%-6.4% Diabetes:              >6.4% Glycemic control for   <7.0% adults with diabetes     CBG: Recent Labs  Lab 01/05/18 1458  01/05/18 2033 01/06/18 0019 01/06/18 0402 01/06/18 [place] 262* 172* 179* 131* 113*   Rush Farmer, M.D. [facility] Pulmonary/Critical Care Medicine. Pager: [phone]. After hours pager: [phone].

## 2018-01-06 NOTE — Progress Notes (Signed)
Inpatient Rehabilitation-Admissions Coordinator    Met with patient at the bedside to discuss team's recommendation for inpatient rehabilitation. Shared booklets, expectations while in CIR, expected length of stay, and anticipated functional level at DC. Though agreeable to CIR, pt was pleasantly confused.   Pt did gave permission for [facility] to speak with her daughter regarding program. [facility] is awaiting a call back to determine caregiver assist and interest in program.  Please call if questions.   [name], OTR/L  Rehab Admissions Coordinator  [phone] 01/06/2018 5:06 PM

## 2018-01-07 ENCOUNTER — Inpatient Hospital Stay (HOSPITAL_COMMUNITY): Payer: Medicare HMO

## 2018-01-07 LAB — BASIC METABOLIC PANEL
Anion gap: 7 (ref 5–15)
BUN: 41 mg/dL — AB (ref 8–23)
CO2: 25 mmol/L (ref 22–32)
Calcium: 9.1 mg/dL (ref 8.9–10.3)
Chloride: 106 mmol/L (ref 98–111)
Creatinine, Ser: 1.35 mg/dL — ABNORMAL HIGH (ref 0.44–1.00)
GFR calc Af Amer: 42 mL/min — ABNORMAL LOW (ref >60)
GFR calc non Af Amer: 36 mL/min — ABNORMAL LOW (ref >60)
Glucose, Bld: 105 mg/dL — ABNORMAL HIGH (ref 70–99)
Potassium: 4.4 mmol/L (ref 3.5–5.1)
Sodium: 138 mmol/L (ref 135–145)

## 2018-01-07 LAB — GLUCOSE, CAPILLARY
GLUCOSE-CAPILLARY: 74 mg/dL (ref 70–99)
GLUCOSE-CAPILLARY: 59 mg/dL — AB (ref 70–99)
GLUCOSE-CAPILLARY: 234 mg/dL — AB (ref 70–99)
Glucose-Capillary: 137 mg/dL — ABNORMAL HIGH (ref 70–99)
Glucose-Capillary: 163 mg/dL — ABNORMAL HIGH (ref 70–99)
Glucose-Capillary: 193 mg/dL — ABNORMAL HIGH (ref 70–99)
Glucose-Capillary: 153 mg/dL — ABNORMAL HIGH (ref 70–99)

## 2018-01-07 LAB — MAGNESIUM: Magnesium: 2 mg/dL (ref 1.7–2.4)

## 2018-01-07 LAB — CBC
HCT: 31.9 % — ABNORMAL LOW (ref 36.0–46.0)
HEMOGLOBIN: 10.4 g/dL — AB (ref 12.0–15.0)
MCH: 27 pg (ref 26.0–34.0)
MCHC: 32.6 g/dL (ref 30.0–36.0)
MCV: 82.9 fL (ref 80.0–100.0)
Platelets: 400 10*3/uL (ref 150–400)
RBC: 3.85 MIL/uL — ABNORMAL LOW (ref 3.87–5.11)
RDW: 16.3 % — ABNORMAL HIGH (ref 11.5–15.5)
WBC: 12.7 10*3/uL — ABNORMAL HIGH (ref 4.0–10.5)
nRBC: 0 % (ref 0.0–0.2)

## 2018-01-07 LAB — PHOSPHORUS: Phosphorus: 2.6 mg/dL (ref 2.5–4.6)

## 2018-01-07 MED ORDER — INSULIN ASPART 100 UNIT/ML ~~LOC~~ SOLN
0.0000 [IU] | Freq: Every day | SUBCUTANEOUS | Status: DC
  Administered 2018-01-08: 3 [IU] via SUBCUTANEOUS

## 2018-01-07 MED ORDER — INSULIN DETEMIR 100 UNIT/ML ~~LOC~~ SOLN
16.0000 [IU] | Freq: Two times a day (BID) | SUBCUTANEOUS | Status: DC
  Administered 2018-01-07 – 2018-01-09 (×4): 16 [IU] via SUBCUTANEOUS
  Filled 2018-01-07 (×5): qty 0.16

## 2018-01-07 MED ORDER — INSULIN ASPART 100 UNIT/ML ~~LOC~~ SOLN
0.0000 [IU] | Freq: Three times a day (TID) | SUBCUTANEOUS | Status: DC
  Administered 2018-01-07 – 2018-01-08 (×3): 2 [IU] via SUBCUTANEOUS
  Administered 2018-01-08: 1 [IU] via SUBCUTANEOUS
  Administered 2018-01-09: 3 [IU] via SUBCUTANEOUS
  Administered 2018-01-09: 5 [IU] via SUBCUTANEOUS

## 2018-01-07 MED ORDER — PREMIER PROTEIN SHAKE
11.0000 [oz_av] | Freq: Every day | ORAL | Status: DC
  Administered 2018-01-07: 11 [oz_av] via ORAL
  Filled 2018-01-07 (×4): qty 325

## 2018-01-07 MED ORDER — ENSURE MAX PROTEIN PO LIQD: 11.0000 [oz_av] | Freq: Every day | ORAL | Status: DC

## 2018-01-07 MED ORDER — ENSURE MAX PROTEIN PO LIQD: 11.0000 [oz_av] | Freq: Two times a day (BID) | ORAL | Status: DC

## 2018-01-07 NOTE — PMR Pre-admission (Signed)
PMR Admission Coordinator Pre-Admission Assessment  Patient: [name] is an 84 y.o., female MRN: [id number] DOB: [birth year] Height: '5\' 1"'  (154.9 cm) Weight: 85 kg              Insurance Information HMO: yes    PPO:      PCP:      IPA:      80/20:      OTHER:  PRIMARY: [employer]      Policy#: [id number]      Subscriber: Patient CM Name: Shirlean Mylar      Phone#: [phone]     Fax#: [phone] Pre-Cert#: [id number]      Employer:  Josem Kaufmann provided by Shirlean Mylar on 12/19 for admit to CIR on 12/20. Clinical updates are due weekly to Joslyn Devon with first update due 01/14/18. Rebecca's (p): [phone] [id number] (f): [id number] Benefits:  Phone #: NA     Name: [email or web address] Eff. Date: 01/21/17 (per family it appears it will term on 01/20/18)     Deduct: $0      Out of Pocket Max: $3,400 (met $1,460.15)      Life Max: NA CIR: $295/day for days 1-6; $0/day for days 7+      SNF: $0/day for days 1-20, $178/day for days 21-100; 100 day limit Outpatient: per necessity     Co-Pay: $10-40 pending charge Home Health: 100%, per necessity      Co-Pay:  DME: 80%     Co-Pay: 20% Providers:  SECONDARY:       Policy#:       Subscriber:  CM Name:       Phone#:      Fax#:  Pre-Cert#:       Employer:  Benefits:  Phone #:      Name:  Eff. Date:      Deduct:       Out of Pocket Max:       Life Max:  CIR:       SNF:  Outpatient:      Co-Pay:  Home Health:       Co-Pay:  DME:      Co-Pay:  *Pt will have a policy with [employer] Medicare starting 01/21/18. Member ID: [id number] (per [facility] Medicare).  Per family, her [facility] Medicare will terminate on 01/20/18.   Medicaid Application Date:      Case Manager:  Disability Application Date:      Case Worker:   Emergency Contact Information Contact Information    Name Relation Home Work Mobile   Sukup,Victoria Daughter   [phone]     Current Medical History  Patient Admitting Diagnosis: Functional deficits due to seizure and encephalopathy.    History of Present Illness: [name] is an 84 year old right-handed female with history of diabetes mellitus, hypertension, CVA, atrial fibrillation maintained on xarelto as well as small bowel obstruction. Per chart review patient lives alone. Independent with assistive device prior to admission. One level home. Plans to stay with her daughter and assistance as needed on discharge. Presented 01/01/2018 with slurred speech as well as question seizure. CT/MRI showed no acute intracranial process. 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 Noted blood sugar 596 and blood pressure in the 200s. EEG with no seizure. Findings consistent with encephalopathy. Patient was loaded with Keppra for seizure prophylaxis. She did require intubation until 01/05/2018. Therapy evaluations completed with recommendations of physical medicine rehabilitation consult. Patient was admitted for a comprehensive rehabilitation program. She is currently tolerating a regular consistency diet. She remains on  Xarelto has prior to admission. Therapy evaluations completed with recommendations of physical medicine rehabilitation consult. Patient is to be admitted for a comprehensive rehabilitation program on 01/08/18.      Past Medical History  Past Medical History:  Diagnosis Date  . A-fib [facility]) 2019   Per patients daughter, [name] [phone]  . ANEMIA, IRON DEFICIENCY 08/11/2008  . ANGIOEDEMA 03/01/2008  . CEREBROVASCULAR ACCIDENT, HX OF 03/01/2008  . CONTACT DERMATITIS&OTHER ECZEMA DUE UNSPEC CAUSE 11/28/2008  . DIABETES MELLITUS 03/01/2008  . Dysuria 01/04/2010  . GERD 03/01/2008  . HYPERSOMNIA 05/30/2009  . HYPERTENSION 03/01/2008  . INTERNAL HEMORRHOIDS 03/01/2008  . KNEE PAIN, RIGHT 09/05/2009  . NONSPECIFIC ABN FINDNG RAD&OTH EXAM BILARY TRCT 03/07/2008  . OBESITY 01/10/2010  . PERIPHERAL EDEMA 08/10/2008  . POLYP, GALLBLADDER 03/07/2008  . SMALL BOWEL OBSTRUCTION, HX OF 03/01/2008  . SNORING 05/11/2009  . Stroke ([place]) 04/08/2016    TIA  . THROMBOCYTOSIS 08/18/2008    Family History  family history includes Allergies in an other family member; Asthma in an other family member; Breast cancer in an other family member; Colitis in her sister; Diabetes in her mother and sister; Heart disease in her mother and sister; Kidney disease in her brother.  Prior Rehab/Hospitalizations:  Has the patient had major surgery during 100 days prior to admission? No  Current Medications   Current Facility-Administered Medications:  .  acetaminophen (TYLENOL) tablet 650 mg, 650 mg, Oral, Q6H PRN, Elgergawy, Silver Huguenin, MD, 650 mg at 01/09/18 0453 .  bisacodyl (DULCOLAX) suppository 10 mg, 10 mg, Rectal, Daily PRN, Ollis, Brandi L, NP, 10 mg at 01/05/18 1600 .  cloNIDine (CATAPRES) tablet 0.2 mg, 0.2 mg, Oral, BID, Collene Gobble, MD, 0.2 mg at 01/09/18 0849 .  diltiazem (CARDIZEM CD) 24 hr capsule 120 mg, 120 mg, Oral, Once, Elgergawy, Dawood S, MD .  insulin aspart (novoLOG) injection 0-5 Units, 0-5 Units, Subcutaneous, QHS, Elgergawy, Silver Huguenin, MD, 3 Units at 01/08/18 2142 .  insulin aspart (novoLOG) injection 0-9 Units, 0-9 Units, Subcutaneous, TID WC, Elgergawy, Silver Huguenin, MD, 3 Units at 01/09/18 0850 .  insulin detemir (LEVEMIR) injection 16 Units, 16 Units, Subcutaneous, BID, Elgergawy, Silver Huguenin, MD, 16 Units at 01/09/18 0849 .  ipratropium-albuterol (DUONEB) 0.5-2.5 (3) MG/3ML nebulizer solution 3 mL, 3 mL, Nebulization, Q6H PRN, Elgergawy, Silver Huguenin, MD, 3 mL at 01/09/18 0132 .  labetalol (NORMODYNE,TRANDATE) injection 20 mg, 20 mg, Intravenous, Q2H PRN, Renee Pain, MD, 20 mg at 01/04/18 0858 .  levETIRAcetam (KEPPRA) tablet 500 mg, 500 mg, Oral, BID, Wynell Balloon, RPH, 500 mg at 01/09/18 0856 .  magnesium hydroxide (MILK OF MAGNESIA) suspension 30 mL, 30 mL, Oral, Daily PRN, Rush Farmer, MD, 30 mL at 01/05/18 1802 .  MEDLINE mouth rinse, 15 mL, Mouth Rinse, BID, Rush Farmer, MD, 15 mL at 01/09/18 0849 .   pantoprazole (PROTONIX) EC tablet 40 mg, 40 mg, Oral, QHS, Collene Gobble, MD, 40 mg at 01/08/18 2114 .  polyethylene glycol (MIRALAX / GLYCOLAX) packet 17 g, 17 g, Oral, Daily, Ollis, Brandi L, NP, 17 g at 01/09/18 0851 .  polyethylene glycol (MIRALAX / GLYCOLAX) packet 34 g, 34 g, Oral, Once, Elgergawy, Dawood S, MD .  protein supplement (ENSURE MAX) liquid, 11 oz, Oral, Daily, Elgergawy, Silver Huguenin, MD, 11 oz at 01/09/18 0851 .  Rivaroxaban (XARELTO) tablet 15 mg, 15 mg, Oral, Q supper, Karren Cobble, RPH, 15 mg at 01/08/18 1748 .  senna (SENOKOT) tablet 8.6  mg, 1 tablet, Oral, Daily, Rush Farmer, MD, 8.6 mg at 01/09/18 0849 .  senna-docusate (Senokot-S) tablet 2 tablet, 2 tablet, Oral, BID, Elgergawy, Silver Huguenin, MD  Patients Current Diet:  Diet Order            Diet Carb Modified Fluid consistency: Thin; Room service appropriate? 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 cane  Prior Functional Level Prior Function Level of Independence: Independent with assistive device(s) Comments: Per chart/RN pt used a cane PTA and drove.  Self Care: Did the patient need help bathing, dressing, using the toilet or eating?  Independent  Indoor Mobility: Did the patient need assistance with walking from room to room (with or without device)? Independent  Stairs: Did the patient need assistance with internal or external stairs (with or without device)?  Independent  Functional Cognition: Did the patient need help planning regular tasks such as shopping or remembering to take medications? Independent  Current Functional Level Cognition  Overall Cognitive Status: Impaired/Different from baseline Current Attention Level: Sustained Orientation Level: Oriented X4 Following Commands: Follows one step commands inconsistently, Follows one step commands with increased time Safety/Judgement: Decreased awareness of safety, Decreased awareness of deficits General Comments: HOH, needs repitition and multimodal cues for safety during ADL mobility    Extremity Assessment (includes Sensation/Coordination)  Upper Extremity Assessment: Overall WFL for tasks assessed  Lower Extremity Assessment: Generalized weakness(decreased coordination at the legs)    ADLs  Overall ADL's : Needs assistance/impaired Eating/Feeding: Set up, Sitting Grooming: Wash/dry face, Wash/dry hands, Standing, Min guard Lower Body Bathing: Moderate assistance, Sit to/from stand Toilet Transfer: Minimal assistance, RW, Comfort height toilet, Ambulation, Grab bars, Cueing for safety Toilet Transfer Details (indicate cue type and reason): pt walking into surfaces with RW on R and L side without awareness Toileting- Clothing Manipulation and Hygiene: Minimal assistance, Sit to/from stand, Cueing for safety Functional mobility during ADLs: Minimal assistance, Rolling walker, Cueing for safety General ADL Comments: pt requires redirection throughout session    Mobility  Overal bed mobility: Needs Assistance Bed Mobility: Supine to Sit Supine to sit: Min assist General bed mobility comments: Pt required assistance to advance B LEs to edge of bed and to elevate trunk into sitting.      Transfers  Overall transfer level: Needs assistance Equipment used: Rolling walker (2 wheeled) Transfers: Sit to/from Stand Sit to Stand: Min assist General transfer comment: needs cues for hand  placement and safety.  Pt attempts to pull on RW for support vs.  pushing from seated surface.      Ambulation / Gait / Stairs / Wheelchair Mobility  Ambulation/Gait Ambulation/Gait assistance: +2 safety/equipment, Min assist Gait Distance (Feet): 60 Feet Assistive device: Rolling walker (2 wheeled) Gait Pattern/deviations: Step-through pattern, Trunk flexed, Decreased stride length, Decreased dorsiflexion - right, Decreased dorsiflexion - left General Gait Details: Pt able to progress ambulation with good tolerance.  She did experience urinary incontinence during gait training.  Constant cues for posture  and upper trunk control.   Gait velocity: slow and flexed.      Posture / Balance Dynamic Sitting Balance Sitting balance - Comments: Pt using hands to maintain trunk off of backrest of chair.  When she lets go, she falls posteriorly and to the right.  Balance Overall balance assessment: Needs assistance Sitting-balance support: Feet supported, Bilateral upper extremity supported Sitting balance-Leahy Scale: Poor Sitting balance - Comments: Pt using hands to maintain trunk off of backrest of chair.  When she lets go, she falls posteriorly and to the right.  Postural control: Posterior lean Standing balance support: Bilateral upper extremity supported, During functional activity, Single extremity supported Standing balance-Leahy Scale: Poor Standing balance comment: needs mod external assist in standing.     Special needs/care consideration BiPAP/CPAP: no CPM: no Continuous Drip IV: no Dialysis: no        Days: NA Life Vest: no Oxygen: no, on RA on 01/09/18.  Special Bed: no Trach Size: no Wound Vac (area): no      Location: no Skin: abrasion to chin, ecchymosis to L arm, moisture associated damage to abdomen and groin   Bowel mgmt:last BM: 01/07/18, continent Bladder mgmt: incontinent  Diabetic mgmt: yes     Previous Home Environment Living Arrangements: Alone Available Help  at Discharge: Family, Available 24 hours/day, Other (Comment)(plans to go to daughter's, will arrange 24/7) Type of Home: House(daughter's) Home Layout: One level Home Access: Stairs to enter [employer] of Steps: 3 Home Care Services: No Additional Comments: information gathered from chart review. question if patient is reliable   Discharge Living Setting Plans for Discharge Living Setting: Other (Comment)(going to daughters home) Type of Home at Discharge: House Discharge Home Layout: One level Discharge Home Access: [place] entrance Discharge Bathroom Shower/Tub: Tub/shower unit Discharge Bathroom Toilet: Standard Discharge Bathroom Accessibility: Yes How Accessible: Accessible via walker, Other (comment)(possibly if sidestepped with RW) Does the patient have any problems obtaining your medications?: No  Social/Family/Support Systems Patient Roles: Other (Comment)(parent to grown children) Contact Information: daughter: [name] [phone]) 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 Anticipated Caregiver: daughter, grandson, and friends  Anticipated Caregiver's Contact Information: see above Ability/Limitations of Caregiver: Supervision Caregiver Availability: Intermittent(has option to hire) Discharge Plan Discussed with Primary Caregiver: Yes Is Caregiver In Agreement with Plan?: Yes Does Caregiver/Family have Issues with Lodging/Transportation while Pt is in Rehab?: No   Goals/Additional Needs Patient/Family Goal for Rehab: PT/OT/SLP: Mod I/Supervision Expected length of stay: 10-14 days Cultural Considerations: "Loves the [employer] Dietary Needs: carb modified, thin liquids; calorie level 1600-2000 Equipment Needs: TBD Pt/Family Agrees to Admission and willing to participate: Yes Program Orientation Provided & Reviewed with Pt/Caregiver Including Roles  & Responsibilities: Yes(daughter and pt)  Barriers to Discharge: Incontinence, Lack of/limited family support  Barriers to Discharge  Comments: family to address support at DC   Decrease burden of Care through IP rehab admission: NA   Possible need for SNF placement upon discharge: Not anticipated; pt has good social support at DC and has good prognosis for further progress through CIR.    Patient Condition: This patient's medical and functional status has changed since the consult dated: 01/06/18 in which the Rehabilitation Physician determined and documented that the patient's condition is appropriate for intensive rehabilitative care in an inpatient rehabilitation facility. See "History of Present Illness" (above) for medical update. Functional changes are: progression in ambulation from Mod A x 2 to Min A x 2 60 feet and progression from Min/Mod A ADLs to Min A. Patient's medical and  functional status update has been discussed with the Rehabilitation physician and patient remains appropriate for inpatient rehabilitation. Will admit to inpatient rehab today.  Preadmission Screen Completed By:  Jhonnie Garner, 01/09/2018 12:47 PM ______________________________________________________________________   Discussed status with Dr. Posey Pronto on 01/09/18 at 12:46PM and received telephone approval for admission today.  Admission Coordinator:  Jhonnie Garner, time 12:46PM Sudie Grumbling 01/09/18

## 2018-01-07 NOTE — Progress Notes (Addendum)
Nutrition Follow-up  DOCUMENTATION CODES:   Obesity unspecified  INTERVENTION:  Ensure Max po BID, each supplement provides 150 kcal and 30 grams of protein  NUTRITION DIAGNOSIS:   Inadequate oral intake related to acute illness as evidenced by NPO status.  Ongoing, pt now advanced to carb modified  GOAL:   Patient will meet greater than or equal to 90% of their needs  Progressing  MONITOR:   PO intake, Diet advancement, Supplement acceptance, Labs, Weight trends  REASON FOR ASSESSMENT:   Ventilator    ASSESSMENT:   84 yo female admitted with status epilepticus requiring intubation. PMH includes DM, HTN, GERD, CVA, SBO   Pt extubated 12/16    Pt alert and confused when DI entered the room. Pt friend at bedside. Pt returned from [facility] and couldn't remembered if she had an Ensure this morning or what she ate for breakfast. Pt reported not being able to swallow salad for lunch and that she is getting chicken pot pie for dinner because she thinks she can swallow that. SLP recommended regular diet consistency with thin liquids.   Pt compliant with Ensure Max with lower carbohydrates and higher protein.   Medications reviewed and include:  Colace 100 mg Insulin sliding scale  Levemir  Protonix Miralax Senokot  Labs reviewed:  CBG: 137,74, 59, 163, 234 X12 hours HA1C: 13 (12/12) BUN 41 (H) Creatine 1.35 (H)  Diet Order:   Diet Order            Diet Carb Modified Fluid consistency: Thin; Room service appropriate? Yes  Diet effective now              EDUCATION NEEDS:   Not appropriate for education at this time  Skin:  Skin Assessment: Skin Integrity Issues: Skin Integrity Issues:: Other (Comment) Other: MASD: abdomen, groin  Last BM:  12/16  Height:   Ht Readings from Last 1 Encounters:  01/01/18 5\' 1"  (1.549 m)    Weight:   Wt Readings from Last 1 Encounters:  01/07/18 88.9 kg    Ideal Body Weight:  47.7 kg  BMI:  Body mass index is  37.03 kg/m.  Estimated Nutritional Needs:   Kcal:  1500-1700 kcals   Protein:  85-100 g  Fluid:  >/= 1.5 L    Mauricia Area, MS, Dietetic Intern Pager: [phone] After hours Pager: [phone]

## 2018-01-07 NOTE — Progress Notes (Signed)
Inpatient Rehabilitation-Admissions Coordinator   [facility] spoke with pt's daughter today regarding CIR recommendation. Pt's daughter able to provide DC support after CIR and want pt to pursue CIR. AC will begin insurance authorization for possible admit.   Jhonnie Garner, OTR/L  Rehab Admissions Coordinator  [phone] 01/07/2018 12:35 PM

## 2018-01-07 NOTE — Progress Notes (Signed)
Physical Therapy Treatment Patient Details Name: [name] MRN: [id number] DOB: November 23, 1932 Today's Date: 01/07/2018    History of Present Illness 84 y.o. female admitted on 01/01/18 for status epilepticus requiring intubation (intubated in ED extubated 01/05/18).  Head CT was negative for acute event.  Neurology following for seizure managment. Hyperglycemia thought to play a role in her current presentation.  She continues to be encephlopathic.  Pt with significant PMH of stroke, obesity, R knee pain, HTN, DM, anemia (iron deficient), and A-fib.    PT Comments    Pt performed gait training and functional mobility.  She continues to require external assistance to mobilize.  Pt remains to present with cognitive impairments.  Pt remains appropriate for intensive therapy to return to baseline and return home with support from family.  Plan next session for continued functional mobility and progression to exercises.      Follow Up Recommendations  CIR     Equipment Recommendations  Rolling walker with 5" wheels    Recommendations for Other Services Rehab consult     Precautions / Restrictions Precautions Precautions: Fall Precaution Comments: seizure Restrictions Weight Bearing Restrictions: No    Mobility  Bed Mobility Overal bed mobility: Needs Assistance Bed Mobility: Supine to Sit     Supine to sit: Min assist     General bed mobility comments: Pt required assistance to advance B LEs to edge of bed and to elevate trunk into sitting.    Transfers Overall transfer level: Needs assistance Equipment used: Rolling walker (2 wheeled) Transfers: Sit to/from Stand Sit to Stand: Min assist         General transfer comment: needs cues for hand placement and safety.  Pt attempts to pull on RW for support vs.  pushing from seated surface.    Ambulation/Gait Ambulation/Gait assistance: +2 safety/equipment;Min assist Gait Distance (Feet): 60 Feet Assistive device:  Rolling walker (2 wheeled) Gait Pattern/deviations: Step-through pattern;Trunk flexed;Decreased stride length;Decreased dorsiflexion - right;Decreased dorsiflexion - left Gait velocity: slow and flexed.     General Gait Details: Pt able to progress ambulation with good tolerance.  She did experience urinary incontinence during gait training.  Constant cues for posture and upper trunk control.     Stairs             Wheelchair Mobility    Modified Rankin (Stroke Patients Only)       Balance Overall balance assessment: Needs assistance Sitting-balance support: Feet supported;Bilateral upper extremity supported Sitting balance-Leahy Scale: Poor Sitting balance - Comments: Pt using hands to maintain trunk off of backrest of chair.  When she lets go, she falls posteriorly and to the right.      Standing balance-Leahy Scale: Poor                              Cognition Arousal/Alertness: Awake/alert Behavior During Therapy: Impulsive Overall Cognitive Status: Impaired/Different from baseline Area of Impairment: Safety/judgement;Problem solving;Awareness;Following commands;Memory;Attention                   Current Attention Level: Sustained Memory: Decreased short-term memory Following Commands: Follows one step commands inconsistently;Follows one step commands with increased time Safety/Judgement: Decreased awareness of safety;Decreased awareness of deficits Awareness: Emergent Problem Solving: Slow processing;Difficulty sequencing;Requires verbal cues;Requires tactile cues General Comments: Pt's cognition is complicated by being very HOH.  She is slow to process, often needs repetition. pt reading bible but unable to state any information about the  palsm verse that she is reading. pt reports that she went to the grocery store this morning. grandson present and correcting patient      Exercises      General Comments        Pertinent Vitals/Pain  Pain Assessment: No/denies pain Faces Pain Scale: No hurt    Home Living                      Prior Function            PT Goals (current goals can now be found in the care plan section) Acute Rehab PT Goals Patient Stated Goal: To get to the bathroom.   Potential to Achieve Goals: Good Progress towards PT goals: Progressing toward goals    Frequency    Min 3X/week      PT Plan Current plan remains appropriate    Co-evaluation              AM-PAC PT "6 Clicks" Mobility   Outcome Measure  Help needed turning from your back to your side while in a flat bed without using bedrails?: A Little Help needed moving from lying on your back to sitting on the side of a flat bed without using bedrails?: A Lot Help needed moving to and from a bed to a chair (including a wheelchair)?: A Lot Help needed standing up from a chair using your arms (e.g., wheelchair or bedside chair)?: A Lot Help needed to walk in hospital room?: A Lot Help needed climbing 3-5 steps with a railing? : A Lot 6 Click Score: 13    End of Session Equipment Utilized During Treatment: Gait belt;Oxygen Activity Tolerance: Patient tolerated treatment well Patient left: in chair;with call bell/phone within reach;with chair alarm set;with family/visitor present Nurse Communication: Mobility status PT Visit Diagnosis: Muscle weakness (generalized) (M62.81);Difficulty in walking, not elsewhere classified (R26.2);Other symptoms and signs involving the nervous system (J62.831)     Time: 5176-1607 PT Time Calculation (min) (ACUTE ONLY): 22 min  Charges:  $Gait Training: 8-22 mins                     Governor Rooks, PTA Acute Rehabilitation Services Pager [phone] Office [phone]     Karryn Kosinski Eli Hose 01/07/2018, 2:37 PM

## 2018-01-07 NOTE — Progress Notes (Signed)
  Speech Language Pathology Treatment: Dysphagia  Patient Details Name: [name] MRN: [id number] DOB: 11/25/1932 Today's Date: 01/07/2018 Time: 2706-2376 SLP Time Calculation (min) (ACUTE ONLY): 33 min  Assessment / Plan / Recommendation Clinical Impression  Patient seen having just finished breakfast. She was sipping her coffee. She has a bruised anterior 1/3 of her tongue from biting her tongue during a seizure. Daughter reports her tongue swelled after the several days of intubation and extubation was delayed due to her tongue. Patient's voice was clear and strong, however clearing her throat approximately 50% of time with small cup sips. She was asked to do the Yale 3 oz water test and stopped 2 times to breath. After completing the test, she had several strong throat clears as well as a large belch. Recommend MBS to further evaluate patient's swallowing function as she appears at bedside to be at risk for aspiration.     HPI HPI: Pt is an 84 y.o. female admitted for status epilepticus requiring intubation 12/12-12/16.  MRI negative for acute changes. Pt with significant PMH of stroke, obesity, GERD, SBO, HTN, DM, anemia (iron deficient), and A-fib.      SLP Plan  MBS;New goals to be determined pending instrumental study       Recommendations   MBS                Oral Care Recommendations: Oral care BID Plan: MBS;New goals to be determined pending instrumental study       [place], [place], CCC-SLP 01/07/2018 10:36 AM

## 2018-01-07 NOTE — Progress Notes (Signed)
Modified Barium Swallow Progress Note  Patient Details  Name: [name] MRN: [id number] Date of Birth: December 30, 1932  Today's Date: 01/07/2018  Modified Barium Swallow completed.  Full report located under Chart Review in the Imaging Section.  Brief recommendations include the following:  Clinical Impression  Patient present with pharyngeal dysphagia likely secondary from intubation. Oral phase of swallow was within normal limits. She had delayed swallow at the pyriform sinuses with all thin liquid boluses and at the vallecule with purees and solids. Reduced epiglottic inversion with thins, but near completion with purees and solids. Reduced pharyngeal peristalis and reduced laryngeal elevation resulting in pharyngeal residue in valleculae and pyriform sinuses with all consistencies. Patient had no penetration or aspiration with any consistency, however she did have clearing throughout exam after flouro was cut off. Suspect clearing was due to residue. In esophageal sweep, the esophagus was clear and no reflux noted. Recommend patient continue regular diet with thin liquids, with no straw. Aspiration precautions of sitting up as straight as possible for all meals, taking small sips and bites with a second swallow. Speech therapy to follow to monitor diet tolerance, pharyngeal strengthening exercises as well as patient and family education.    Swallow Evaluation Recommendations       SLP Diet Recommendations: Regular solids;Thin liquid   Liquid Administration via: Cup;No straw   Medication Administration: Whole meds with puree   Supervision: Patient able to self feed;Intermittent supervision to cue for compensatory strategies   Compensations: Minimize environmental distractions;Slow rate;Small sips/bites;Effortful swallow(second swallow after every sip/bite)   Postural Changes: Seated upright at 90 degrees   Oral Care Recommendations: Oral care BID     [place], MA,  CCC-SLP 01/07/2018 1:57 PM

## 2018-01-07 NOTE — Care Management Important Message (Signed)
Important Message  Patient Details  Name: [name] MRN: [id number] Date of Birth: 1932-10-30   Medicare Important Message Given:  Yes    Tyria Springer [address] 01/07/2018, 4:07 PM

## 2018-01-07 NOTE — Progress Notes (Signed)
PROGRESS NOTE                                                                                                                                                                                                             Patient Demographics:    [name], is a 84 y.o. female, DOB - 03-20-1932, URK:[id number]  Admit date - 01/01/2018   Admitting Physician Renee Pain, MD  Outpatient Primary MD for the patient is Charolette Forward, MD  LOS - 6   Chief Complaint  Patient presents with  . Fall  . Head Injury       Brief Narrative    84 year old female with past medical history of diabetes and hypertension admitted 12/12 for status epilepticus requiring intubation.  12/11 to ED for slurred speech/TIA. Discharged 12/12 to ED found down. Seizures. Intubated  12/14 Cervical spine cleared, no further seizure activity.  PSV  12/15 No cuff leak on exam   Subjective:    [name] today has, No headache, No chest pain, No abdominal pain - No Nausea,No Cough - SOB.    Assessment  & Plan :    Principal Problem:   Status epilepticus ([place]) Active Problems:   Accelerated hypertension   Acute kidney injury ([place])   Type 2 diabetes mellitus ([place])   Respiratory failure ([place])  Status epilepticus  -No seizure history.  Required Intubation on presentation for airway protection, felt by neurology most likely due to hyperglycemia, no evidence of present on repeat MRI 01/02/2018 . -Continue with Keppra 500 mg oral twice daily    History of CVA and TIA - on Xarelto, MRI this admission with no evidence of acute CVA  Paroxysmal A. Fib -Heart rate controlled, continue with Cardizem, will continue with Xarelto for anticoagulation  Diabetes mellitus -Had low CBG today, I will lower her Levemir, and change sliding scale to before meals times  Hypertension  -Blood pressure acceptable, continue with home meds    Code Status : Full  Family  Communication  : Daughter at bedside  Disposition Plan  : CIR consulted  Consults  : PCCM, neurology, inpatient rehab  Procedures  : None  DVT Prophylaxis  : Xarelto  Lab Results  Component Value Date   PLT 400 01/07/2018    Antibiotics  :    Anti-infectives (From admission, onward)  None        Objective:   Vitals:   01/06/18 2133 01/07/18 0500 01/07/18 0549 01/07/18 0930  BP: (!) 158/71  (!) 134/59 (!) 148/64  Pulse: 75  64   Resp:      Temp: 98 F (36.7 C)  97.8 F (36.6 C)   TempSrc: Oral  Oral   SpO2: 99%  100%   Weight:  88.9 kg    Height:        Wt Readings from Last 3 Encounters:  01/07/18 88.9 kg  12/31/17 81.6 kg  11/06/17 82.6 kg     Intake/Output Summary (Last 24 hours) at 01/07/2018 1357 Last data filed at 01/07/2018 0930 Gross per 24 hour  Intake 440 ml  Output 1475 ml  Net -1035 ml     Physical Exam  Awake Alert, Oriented X 3, No new F.N deficits, Normal affect Symmetrical Chest wall movement, Good air movement bilaterally, CTAB RRR,No Gallops,Rubs or new Murmurs, No Parasternal Heave +ve B.Sounds, Abd Soft, No tenderness,  No rebound - guarding or rigidity. No Cyanosis, Clubbing or edema, No new Rash or bruise      Data Review:    CBC Recent Labs  Lab 12/31/17 1724  01/01/18 0515  01/02/18 0612 01/03/18 7893 01/04/18 0314 01/05/18 0624 01/06/18 0259 01/07/18 0330  WBC 9.2  --  14.7*   < > 13.4* 14.0* 9.1 11.7* 17.1* 12.7*  HGB 12.4   < > 12.8   < > 12.1 11.4* 9.4* 10.6* 9.7* 10.4*  HCT 39.3   < > 42.2   < > 37.9 36.2 32.9* 34.0* 30.6* 31.9*  PLT 359  --  386   < > 370 320 248 376 401* 400  MCV 83.6  --  87.0   < > 82.6 84.6 78.5* 83.5 82.5 82.9  MCH 26.4  --  26.4   < > 26.4 26.6 22.4* 26.0 26.1 27.0  MCHC 31.6  --  30.3   < > 31.9 31.5 28.6* 31.2 31.7 32.6  RDW 15.6*  --  15.8*   < > 15.8* 16.3* 14.7 16.4* 16.0* 16.3*  LYMPHSABS 4.3*  --  6.6*  --  2.3 3.0  --   --   --   --   MONOABS 0.9  --  1.4*  --  1.3* 1.8*   --   --   --   --   EOSABS 0.3  --  0.2  --  0.0 0.2  --   --   --   --   BASOSABS 0.1  --  0.1  --  0.0 0.1  --   --   --   --    < > = values in this interval not displayed.    Chemistries  Recent Labs  Lab 12/31/17 1724  01/01/18 0515  01/03/18 0651 01/04/18 0314 01/05/18 0318 01/06/18 0259 01/07/18 0330  NA 128*   < > 133*   < > 137 138 138 139 138  K 5.2*   < > 4.1   < > 4.3 4.1 5.4* 5.0 4.4  CL 97*   < > 98   < > 106 105 107 107 106  CO2 21*  --  11*   < > 21* 23 18* 22 25  GLUCOSE 512*   < > 578*   < > 302* 180* 290* 160* 105*  BUN 31*   < > 31*   < > 20 22 29* 39* 41*  CREATININE  1.40*   < > 1.95*   < > 1.28* 1.03* 1.14* 1.15* 1.35*  CALCIUM 9.6  --  9.8   < > 8.5* 8.8* 9.0 9.1 9.1  MG  --   --   --    < > 1.9 2.0 1.8 2.0 2.0  AST 16  --  25  --   --   --   --   --   --   ALT 17  --  17  --   --   --   --   --   --   ALKPHOS 94  --  92  --   --   --   --   --   --   BILITOT 0.4  --  0.5  --   --   --   --   --   --    < > = values in this interval not displayed.   ------------------------------------------------------------------------------------------------------------------ No results for input(s): CHOL, HDL, LDLCALC, TRIG, CHOLHDL, LDLDIRECT in the last 72 hours.  Lab Results  Component Value Date   HGBA1C 13.0 (H) 01/01/2018   ------------------------------------------------------------------------------------------------------------------ No results for input(s): TSH, T4TOTAL, T3FREE, THYROIDAB in the last 72 hours.  Invalid input(s): FREET3 ------------------------------------------------------------------------------------------------------------------ No results for input(s): VITAMINB12, FOLATE, FERRITIN, TIBC, IRON, RETICCTPCT in the last 72 hours.  Coagulation profile Recent Labs  Lab 12/31/17 1724  INR 1.18    No results for input(s): DDIMER in the last 72 hours.  Cardiac Enzymes No results for input(s): CKMB, TROPONINI, MYOGLOBIN in the last  168 hours.  Invalid input(s): CK ------------------------------------------------------------------------------------------------------------------ No results found for: BNP  Inpatient Medications  Scheduled Meds: . cloNIDine  0.2 mg Oral BID  . diltiazem  30 mg Oral Q8H  . docusate sodium  100 mg Oral BID  . feeding supplement (ENSURE ENLIVE)  237 mL Oral BID BM  . insulin aspart  0-15 Units Subcutaneous Q4H  . insulin detemir  20 Units Subcutaneous BID  . levETIRAcetam  500 mg Oral BID  . mouth rinse  15 mL Mouth Rinse BID  . pantoprazole  40 mg Oral QHS  . polyethylene glycol  17 g Oral Daily  . rivaroxaban  15 mg Oral Q supper  . senna  1 tablet Oral Daily   Continuous Infusions: PRN Meds:.acetaminophen, bisacodyl, labetalol, magnesium hydroxide  Micro Results Recent Results (from the past 240 hour(s))  Culture, blood (Routine X 2) w Reflex to ID Panel     Status: None   Collection Time: 01/01/18  8:45 AM  Result Value Ref Range Status   Specimen Description BLOOD LEFT ANTECUBITAL  Final   Special Requests   Final    BOTTLES DRAWN AEROBIC AND ANAEROBIC Blood Culture adequate volume   Culture   Final    NO GROWTH 5 DAYS Performed at [facility], [place] [address], [place], [place] [address]    Report Status 01/06/2018 FINAL  Final  Culture, blood (Routine X 2) w Reflex to ID Panel     Status: None   Collection Time: 01/01/18  9:00 AM  Result Value Ref Range Status   Specimen Description BLOOD LEFT FOREARM  Final   Special Requests   Final    BOTTLES DRAWN AEROBIC AND ANAEROBIC Blood Culture adequate volume   Culture   Final    NO GROWTH 5 DAYS Performed at [facility], [place] [address] [place], [place] [address]    Report Status 01/06/2018 FINAL  Final  Culture, respiratory (non-expectorated)     Status: None   Collection Time: 01/01/18  9:42 AM  Result Value Ref Range Status   Specimen Description TRACHEAL ASPIRATE  Final   Special Requests NONE   Final   Gram Stain   Final    RARE WBC PRESENT, PREDOMINANTLY PMN ABUNDANT GRAM POSITIVE COCCI IN PAIRS IN CHAINS FEW GRAM POSITIVE RODS RARE GRAM NEGATIVE RODS    Culture   Final    ABUNDANT Consistent with normal respiratory flora. Performed at [facility], [place] [address] [facility], [place] [address]    Report Status 01/03/2018 FINAL  Final  MRSA PCR Screening     Status: None   Collection Time: 01/01/18  9:09 PM  Result Value Ref Range Status   MRSA by PCR NEGATIVE NEGATIVE Final    Comment:        The GeneXpert MRSA Assay (FDA approved for NASAL specimens only), is one component of a comprehensive MRSA colonization surveillance program. It is not intended to diagnose MRSA infection nor to guide or monitor treatment for MRSA infections. Performed at [facility], [place] [address], [place], [place] [address]     Radiology Reports Dg Abd 1 View  Result Date: 01/01/2018 CLINICAL DATA:  NG placement EXAM: ABDOMEN - 1 VIEW COMPARISON:  CT abdomen 07/25/2007 FINDINGS: NG tip in the gastric antrum. Distended large and small bowel loops. Moderate stool in the right colon. Mild stool in the rectosigmoid. IMPRESSION: NG tube in the gastric antrum Adynamic ileus with retained stool in the colon. Moderate stool in the right colon. Electronically Signed   By: Franchot Gallo M.D.   On: 01/01/2018 16:55   Ct Head Wo Contrast  Result Date: 01/01/2018 CLINICAL DATA:  84 year old female with head trauma. EXAM: CT HEAD WITHOUT CONTRAST CT CERVICAL SPINE WITHOUT CONTRAST TECHNIQUE: Multidetector CT imaging of the head and cervical spine was performed following the standard protocol without intravenous contrast. Multiplanar CT image reconstructions of the cervical spine were also generated. COMPARISON:  Head CT dated 12/31/2017 an MRI dated 12/31/2017 FINDINGS: CT HEAD FINDINGS Brain: There is moderate age-related atrophy and chronic microvascular ischemic changes. There is no acute  intracranial hemorrhage. No mass effect or midline shift. No extra-axial fluid collection. Vascular: No hyperdense vessel or unexpected calcification. Skull: Normal. Negative for fracture or focal lesion. Sinuses/Orbits: Partial opacification of the right sphenoid sinus. The remainder of the visualized paranasal sinuses and mastoid air cells are clear. Other: An enteric tube is partially visualized. CT CERVICAL SPINE FINDINGS Alignment: No acute subluxation. Skull base and vertebrae: No acute fracture. Osteopenia. Soft tissues and spinal canal: No prevertebral fluid or swelling. No visible canal hematoma. Disc levels:  Degenerative changes primarily at C4-C6. Upper chest: Atherosclerotic calcification of the aortic arch. Heterogeneous and mildly enlarged thyroid gland with multiple hypodense nodules. Further evaluation with ultrasound on a nonemergent basis recommended. Other: An endotracheal and an enteric tube are partially visualized. IMPRESSION: 1. No acute intracranial hemorrhage. 2. No acute/traumatic cervical spine pathology. Electronically Signed   By: Anner Crete M.D.   On: 01/01/2018 06:28   Ct Head Wo Contrast  Result Date: 12/31/2017 CLINICAL DATA:  Slurred speech EXAM: CT HEAD WITHOUT CONTRAST TECHNIQUE: Contiguous axial images were obtained from the base of the skull through the vertex without intravenous contrast. COMPARISON:  CT brain 09/16/2016, MRI 04/09/2016 FINDINGS: Brain: No acute territorial infarction, hemorrhage, or intracranial mass is visualized. Atrophy with moderate to marked small vessel ischemic changes of  the white matter. Chronic lacunar infarcts within the basal ganglia and cerebellum. Stable ventricle size. Vascular: No hyperdense vessels.  Carotid vascular calcification. Skull: Normal. Negative for fracture or focal lesion. Sinuses/Orbits: No acute finding. Other: None IMPRESSION: 1. No CT evidence for acute intracranial abnormality. 2. Atrophy and small vessel ischemic  changes of the white matter. Electronically Signed   By: Donavan Foil M.D.   On: 12/31/2017 18:35   Ct Cervical Spine Wo Contrast  Result Date: 01/01/2018 CLINICAL DATA:  84 year old female with head trauma. EXAM: CT HEAD WITHOUT CONTRAST CT CERVICAL SPINE WITHOUT CONTRAST TECHNIQUE: Multidetector CT imaging of the head and cervical spine was performed following the standard protocol without intravenous contrast. Multiplanar CT image reconstructions of the cervical spine were also generated. COMPARISON:  Head CT dated 12/31/2017 an MRI dated 12/31/2017 FINDINGS: CT HEAD FINDINGS Brain: There is moderate age-related atrophy and chronic microvascular ischemic changes. There is no acute intracranial hemorrhage. No mass effect or midline shift. No extra-axial fluid collection. Vascular: No hyperdense vessel or unexpected calcification. Skull: Normal. Negative for fracture or focal lesion. Sinuses/Orbits: Partial opacification of the right sphenoid sinus. The remainder of the visualized paranasal sinuses and mastoid air cells are clear. Other: An enteric tube is partially visualized. CT CERVICAL SPINE FINDINGS Alignment: No acute subluxation. Skull base and vertebrae: No acute fracture. Osteopenia. Soft tissues and spinal canal: No prevertebral fluid or swelling. No visible canal hematoma. Disc levels:  Degenerative changes primarily at C4-C6. Upper chest: Atherosclerotic calcification of the aortic arch. Heterogeneous and mildly enlarged thyroid gland with multiple hypodense nodules. Further evaluation with ultrasound on a nonemergent basis recommended. Other: An endotracheal and an enteric tube are partially visualized. IMPRESSION: 1. No acute intracranial hemorrhage. 2. No acute/traumatic cervical spine pathology. Electronically Signed   By: Anner Crete M.D.   On: 01/01/2018 06:28   Mr Brain Wo Contrast  Result Date: 01/03/2018 CLINICAL DATA:  New onset of seizure.  Re-evaluate for PRES. EXAM: MRI HEAD  WITHOUT CONTRAST TECHNIQUE: Multiplanar, multiecho pulse sequences of the brain and surrounding structures were obtained without intravenous contrast. COMPARISON:  MR brain 12/31/2017. FINDINGS: Brain: No evidence for acute infarction, hemorrhage, mass lesion, hydrocephalus, or extra-axial fluid. Generalized atrophy. Advanced chronic microvascular ischemic change. Unchanged small parafalcine arachnoid cyst. No imaging findings conclusive for PRES. Chronic BILATERAL cerebellar infarcts. Coronal imaging demonstrates no asymmetry, mass, or inflammatory process affecting the temporal lobes. Vascular: Flow voids are maintained. Chronic microhemorrhages are stable. Skull and upper cervical spine: Normal marrow signal. Sinuses/Orbits: No acute findings. RIGHT cataract extraction. Chronic sinus disease. Other: None. IMPRESSION: Advanced changes of atrophy and small vessel disease with chronic microhemorrhages and scattered lacunar infarcts. Stable exam from 12/31/2017. No acute intracranial abnormality. No features to suggest interval development of PRES. Electronically Signed   By: Staci Righter M.D.   On: 01/03/2018 12:25   Mr Brain Wo Contrast  Result Date: 12/31/2017 CLINICAL DATA:  Recurrent slurred speech this afternoon. History of hypertension and diabetes. EXAM: MRI HEAD WITHOUT CONTRAST TECHNIQUE: Multiplanar, multiecho pulse sequences of the brain and surrounding structures were obtained without intravenous contrast. COMPARISON:  CT HEAD December 31, 2017 FINDINGS: INTRACRANIAL CONTENTS: No reduced diffusion to suggest acute ischemia. No susceptibility artifact to suggest hemorrhage. Old bilateral cerebellar infarcts. Confluent supratentorial white matter FLAIR T2 hyperintensities. No midline shift, mass effect or masses. Scattered cerebellar and to lesser extent central cerebrum more chronic microhemorrhages. Prominent basal ganglia and thalami perivascular spaces associated with chronic hypertension.  Moderate parenchymal brain volume  loss. No hydrocephalus. No abnormal extra-axial fluid collections. Small RIGHT parafalcine arachnoid cyst. VASCULAR: Normal major intracranial vascular flow voids present at skull base. SKULL AND UPPER CERVICAL SPINE: No abnormal sellar expansion. No suspicious calvarial bone marrow signal. Craniocervical junction maintained. SINUSES/ORBITS: The mastoid air-cells and included paranasal sinuses are well-aerated.The included ocular globes and orbital contents are non-suspicious. Status post RIGHT ocular lens implant. OTHER: LEFT nasal skin nodule.  Recommend direct inspection. IMPRESSION: 1. No acute intracranial process. 2. Multiple old cerebellar infarcts. 3. Moderate to severe chronic small vessel ischemic changes. Electronically Signed   By: Elon Alas M.D.   On: 12/31/2017 20:26   Mr Cervical Spine Wo Contrast  Result Date: 01/02/2018 CLINICAL DATA:  Unresponsive patient.  Found face down. EXAM: MRI CERVICAL SPINE WITHOUT CONTRAST TECHNIQUE: Multiplanar, multisequence MR imaging of the cervical spine was performed. No intravenous contrast was administered. COMPARISON:  Brain MRI 12/31/2017 FINDINGS: Alignment: Normal. Vertebrae: No focal marrow lesion. No compression fracture or evidence of discitis osteomyelitis. Cord: Normal caliber and signal. Posterior Fossa, vertebral arteries, paraspinal tissues: Visualized posterior fossa is normal. Vertebral artery flow voids are preserved. No prevertebral effusion. Disc levels: Sagittal imaging includes the atlantoaxial joint to the level of the T2-3 disc space, with axial imaging of the disc spaces from C2-3 to C7-T1. There is no spinal canal stenosis. Small disc bulges at C3-4, C4-5 and C5-6 with associated uncovertebral hypertrophy. There is severe left neural foraminal stenosis at C4-5 and C5-6. IMPRESSION: 1. No acute abnormality of the cervical spine. Normal appearance of the spinal cord. 2. Severe left neural foraminal  stenosis at C4-5 and C5-6. Electronically Signed   By: Ulyses Jarred M.D.   On: 01/02/2018 18:54   Dg Chest Port 1 View  Result Date: 01/04/2018 CLINICAL DATA:  Acute respiratory failure EXAM: PORTABLE CHEST 1 VIEW COMPARISON:  January 03, 2018 FINDINGS: The ETT terminates 18 mm above the carina. Recommend withdrawing 1 cm. The enteric tube terminates below today's film. No pneumothorax. The cardiomediastinal silhouette is stable. No other interval changes. IMPRESSION: 1. The ETT terminates 18 mm above the carina. Recommend withdrawing 1 cm. 2. No other interval changes or acute abnormalities are noted. These results will be called to the ordering clinician or representative by the Radiologist Assistant, and communication documented in the PACS or zVision Dashboard. Electronically Signed   By: Dorise Bullion III M.D   On: 01/04/2018 08:07   Dg Chest Port 1 View  Result Date: 01/03/2018 CLINICAL DATA:  Respiratory failure. EXAM: PORTABLE CHEST 1 VIEW COMPARISON:  Radiograph of January 02, 2018. FINDINGS: Endotracheal tube tip is seen right at the carina; withdrawal by 2-3 cm is recommended. Nasogastric tube is unchanged in position. No pneumothorax or significant pleural effusion is noted. No acute pulmonary disease is noted. Bony thorax is unremarkable. IMPRESSION: Endotracheal tube tip is seen at the carina; withdrawal by 2-3 cm is recommended. No acute abnormality seen in the chest. These results will be called to the ordering clinician or representative by the Radiologist Assistant, and communication documented in the PACS or zVision Dashboard. Electronically Signed   By: Marijo Conception, M.D.   On: 01/03/2018 08:05   Dg Chest Port 1 View  Result Date: 01/02/2018 CLINICAL DATA:  Respiratory failure EXAM: PORTABLE CHEST 1 VIEW COMPARISON:  Yesterday FINDINGS: Endotracheal tube tip is 18 mm above the carina. An orogastric tube reaches the stomach. Cardiomegaly. Indistinct infrahilar opacities. No  Kerley lines or pneumothorax. IMPRESSION: 1. Unremarkable hardware positioning. 2. Infrahilar  opacities favoring atelectasis. Electronically Signed   By: Monte Fantasia M.D.   On: 01/02/2018 07:44   Dg Chest Port 1 View  Result Date: 01/01/2018 CLINICAL DATA:  84 y/o F; post intubation and enteric tube placement. EXAM: PORTABLE CHEST 1 VIEW COMPARISON:  01/01/2018 chest radiograph. FINDINGS: Stable normal cardiac silhouette given projection and technique. Aortic atherosclerosis with calcification. Enteric tube tip projects 2.1 cm above the carina. Enteric tube tip extends the proximal stomach, the proximal side hole is in the distal esophagus. Linear opacity at left lung base compatible with atelectasis. No pleural effusion or pneumothorax. No acute osseous abnormality identified. IMPRESSION: 1. Enteric tube tip 2.1 cm above carina. Endotracheal tube tip in proximal stomach, approximately 5 cm advancement recommended. 2. Left basilar atelectasis. Electronically Signed   By: Kristine Garbe M.D.   On: 01/01/2018 06:23   Dg Chest Port 1 View  Result Date: 01/01/2018 CLINICAL DATA:  84 year old female with altered mental status. EXAM: PORTABLE CHEST 1 VIEW COMPARISON:  Chest radiograph dated 07/25/2017 FINDINGS: Shallow inspiration. No focal consolidation, pleural effusion, or pneumothorax. Mild cardiomegaly. Atherosclerotic calcification of the aortic arch. No acute osseous pathology. Left upper quadrant surgical clips noted. There is gaseous distention of the visualized colon. IMPRESSION: No acute cardiopulmonary process. Electronically Signed   By: Anner Crete M.D.   On: 01/01/2018 05:29      Phillips Climes M.D on 01/07/2018 at 1:57 PM  Between 7am to 7pm - Pager - [phone]  After 7pm go to [email or web address] - password [email or web address]  [facility] -  Office  [phone]

## 2018-01-07 NOTE — Progress Notes (Signed)
Inpatient Diabetes Program Recommendations  AACE/ADA: New Consensus Statement on Inpatient Glycemic Control (2015)  Target Ranges:  Prepandial:   less than 140 mg/dL      Peak postprandial:   less than 180 mg/dL (1-2 hours)      Critically ill patients:  140 - 180 mg/dL   Lab Results  Component Value Date   GLUCAP 163 (H) 01/07/2018   HGBA1C 13.0 (H) 01/01/2018    Review of Glycemic Control Results for [name], [name] (MRN [id number]) as of 01/07/2018 12:35  Ref. Range 01/07/2018 05:43 01/07/2018 07:58 01/07/2018 08:59  Glucose-Capillary Latest Ref Range: 70 - 99 mg/dL 74 59 (L) 163 (H)   Diabetes history: DM 2 Outpatient Diabetes medications:  Metformin 500 mg daily, Levemir 25 units in the AM and 18 units in the PM Current orders for Inpatient glycemic control: Levemir 20 units BID, Novolog 0-15 units Q4H  Inpatient Diabetes Program Recommendations:    Noted hypoglycemic event of 59 mg/dL this AM.   Consider:  -Decreasing correction to Novolog 0-9 units TID. - Adding Novolog 4 units TID (assuming patient consuming >50% of meals).  Thanks, Bronson Curb, MSN, RNC-OB Diabetes Coordinator [phone] (8a-5p)

## 2018-01-08 ENCOUNTER — Inpatient Hospital Stay (HOSPITAL_COMMUNITY): Payer: Medicare HMO

## 2018-01-08 LAB — GLUCOSE, CAPILLARY
Glucose-Capillary: 130 mg/dL — ABNORMAL HIGH (ref 70–99)
Glucose-Capillary: 151 mg/dL — ABNORMAL HIGH (ref 70–99)
Glucose-Capillary: 173 mg/dL — ABNORMAL HIGH (ref 70–99)
Glucose-Capillary: 204 mg/dL — ABNORMAL HIGH (ref 70–99)
Glucose-Capillary: 288 mg/dL — ABNORMAL HIGH (ref 70–99)

## 2018-01-08 MED ORDER — FUROSEMIDE 10 MG/ML IJ SOLN
20.0000 mg | Freq: Once | INTRAMUSCULAR | Status: AC
  Administered 2018-01-08: 20 mg via INTRAVENOUS
  Filled 2018-01-08: qty 2

## 2018-01-08 MED ORDER — ALBUTEROL SULFATE (2.5 MG/3ML) 0.083% IN NEBU
2.5000 mg | INHALATION_SOLUTION | Freq: Once | RESPIRATORY_TRACT | Status: AC
  Administered 2018-01-08: 2.5 mg via RESPIRATORY_TRACT
  Filled 2018-01-08: qty 3

## 2018-01-08 MED ORDER — IPRATROPIUM BROMIDE 0.02 % IN SOLN
0.5000 mg | Freq: Once | RESPIRATORY_TRACT | Status: AC
  Administered 2018-01-08: 0.5 mg via RESPIRATORY_TRACT
  Filled 2018-01-08: qty 2.5

## 2018-01-08 MED ORDER — IPRATROPIUM-ALBUTEROL 0.5-2.5 (3) MG/3ML IN SOLN
3.0000 mL | Freq: Four times a day (QID) | RESPIRATORY_TRACT | Status: DC | PRN
  Administered 2018-01-08 – 2018-01-09 (×2): 3 mL via RESPIRATORY_TRACT
  Filled 2018-01-08 (×2): qty 3

## 2018-01-08 NOTE — Discharge Instructions (Signed)

## 2018-01-08 NOTE — Progress Notes (Signed)
Occupational Therapy Treatment Patient Details Name: [name] MRN: [id number] DOB: 1932/02/12 Today's Date: 01/08/2018    History of present illness 84 y.o. female admitted on 01/01/18 for status epilepticus requiring intubation (intubated in ED extubated 01/05/18).  Head CT was negative for acute event.  Neurology following for seizure managment. Hyperglycemia thought to play a role in her current presentation.  She continues to be encephlopathic.  Pt with significant PMH of stroke, obesity, R knee pain, HTN, DM, anemia (iron deficient), and A-fib.   OT comments  Pt making progress with functional goals. Pt ambulated to bathroom with RW with min A for transfer to toilet with multimodal cues and repetition for safety during toileting tasks. Pt's daughters present this sessionOT will continue to follow acutely  Follow Up Recommendations  Other (comment)(TBD at next venue of care)    Equipment Recommendations  (RW)    Recommendations for Other Services      Precautions / Restrictions Precautions Precautions: Fall Precaution Comments: seizure Restrictions Weight Bearing Restrictions: No       Mobility Bed Mobility Overal bed mobility: Needs Assistance Bed Mobility: Supine to Sit     Supine to sit: Min assist     General bed mobility comments: Pt required assistance to advance B LEs to edge of bed and to elevate trunk into sitting.    Transfers Overall transfer level: Needs assistance Equipment used: Rolling walker (2 wheeled) Transfers: Sit to/from Stand Sit to Stand: Min assist         General transfer comment: needs cues for hand placement and safety.  Pt attempts to pull on RW for support vs.  pushing from seated surface.      Balance Overall balance assessment: Needs assistance Sitting-balance support: Feet supported;Bilateral upper extremity supported Sitting balance-Leahy Scale: Poor     Standing balance support: Bilateral upper extremity  supported;During functional activity;Single extremity supported Standing balance-Leahy Scale: Poor                             ADL either performed or assessed with clinical judgement   ADL Overall ADL's : Needs assistance/impaired     Grooming: Wash/dry face;Wash/dry hands;Standing;Min guard                   Armed forces technical officer: Minimal assistance;RW;Comfort height toilet;Ambulation;Grab bars;Cueing for safety   Toileting- Clothing Manipulation and Hygiene: Minimal assistance;Sit to/from stand;Cueing for safety       Functional mobility during ADLs: Minimal assistance;Rolling walker;Cueing for safety General ADL Comments: pt requires redirection throughout session     Vision Patient Visual Report: No change from baseline     Perception     Praxis      Cognition Arousal/Alertness: Awake/alert Behavior During Therapy: Impulsive Overall Cognitive Status: Impaired/Different from baseline Area of Impairment: Safety/judgement;Problem solving;Awareness;Following commands;Memory;Attention                 Orientation Level: Disoriented to;Time;Situation   Memory: Decreased short-term memory Following Commands: Follows one step commands inconsistently;Follows one step commands with increased time Safety/Judgement: Decreased awareness of safety;Decreased awareness of deficits   Problem Solving: Slow processing;Difficulty sequencing;Requires verbal cues;Requires tactile cues General Comments: HOH, needs repitition and multimodal cues for safety during ADL mobility        Exercises     Shoulder Instructions       General Comments      Pertinent Vitals/ Pain       Pain Assessment: No/denies  pain  Home Living                                          Prior Functioning/Environment              Frequency  Min 2X/week        Progress Toward Goals  OT Goals(current goals can now be found in the care plan section)   Progress towards OT goals: Progressing toward goals     Plan      Co-evaluation                 AM-PAC OT "6 Clicks" Daily Activity     Outcome Measure   Help from another person eating meals?: A Little Help from another person taking care of personal grooming?: A Little Help from another person toileting, which includes using toliet, bedpan, or urinal?: A Lot Help from another person bathing (including washing, rinsing, drying)?: A Lot Help from another person to put on and taking off regular upper body clothing?: A Lot Help from another person to put on and taking off regular lower body clothing?: A Lot 6 Click Score: 14    End of Session Equipment Utilized During Treatment: Gait belt;Rolling walker  OT Visit Diagnosis: Unsteadiness on feet (R26.81);Muscle weakness (generalized) (M62.81)   Activity Tolerance Patient tolerated treatment well   Patient Left in chair;with call bell/phone within reach;with chair alarm set;with family/visitor present   Nurse Communication Mobility status;Precautions        Time: 8453-6468 OT Time Calculation (min): 28 min  Charges: OT General Charges $OT Visit: 1 Visit OT Treatments $Self Care/Home Management : 8-22 mins $Therapeutic Activity: 8-22 mins     Britt Bottom 01/08/2018, 1:12 PM

## 2018-01-08 NOTE — Progress Notes (Signed)
Inpatient Diabetes Program Recommendations  AACE/ADA: New Consensus Statement on Inpatient Glycemic Control (2015)  Target Ranges:  Prepandial:   less than 140 mg/dL      Peak postprandial:   less than 180 mg/dL (1-2 hours)      Critically ill patients:  140 - 180 mg/dL   Lab Results  Component Value Date   GLUCAP 173 (H) 01/08/2018   HGBA1C 13.0 (H) 01/01/2018    Review of Glycemic Control Results for ZAHLI, VETSCH (MRN [id number]) as of 01/08/2018 13:16  Ref. Range 01/08/2018 00:19 01/08/2018 08:45 01/08/2018 12:50  Glucose-Capillary Latest Ref Range: 70 - 99 mg/dL 151 (H) 130 (H) 173 (H)   Diabetes history:DM 2 Outpatient Diabetes medications: Metformin 500 mg daily, Levemir 25 units in the AM and 18 units in the PM Current orders for Inpatient glycemic control:Levemir 16 units BID, Novolog 0-9 units TID, Novolog 0-5 units QHS  Inpatient Diabetes Program Recommendations:    Spoke with patient and both daughters regarding diabetes management. Patient admits to missing multiple doses, especially while she was "getting the batteries changed in the meter". Daughters report they frequently would ask if patient had been taking injections and she would say, "Yes" so they assumed she was. Patient, supposedly, had been good about taking medications and recording CBGs in a journal. Daughters did mention difficulty with administering the Levemir, in that they would have to wake the patient at 11pm in order to give the last injection; making compliance sometimes difficult.  Reviewed patient's current A1c of 13.0%. Explained what a A1c is and what it measures. Also reviewed goal A1c with patient, importance of good glucose control @ home, and blood sugar goals. Reviewed patho of DM, need for additional insulin, vascular changes and co-morbidies.   Reviewed diet modifications. Patient frequently consumes sweet tea and lemonade. Discussed and encouraged selecting foods based off of nutational  value. Also, reviewed some techniques that may help patient remember dosing and not feel that daughters are constantly reminding her. These included: alarm setting, daily schedule posted on frequently visited areas. Reviewed Levemir benefits and how it should be administered. Encouraged to ensure that injections are 12 hours apart, however, may want to adjust administration times to make it work for their schedule and to discuss this with MD. No further questions at this time.    Thanks, Bronson Curb, MSN, RNC-OB Diabetes Coordinator [phone] (8a-5p)\

## 2018-01-08 NOTE — Progress Notes (Signed)
  Speech Language Pathology Treatment: Dysphagia  Patient Details Name: [name] MRN: [id number] DOB: 1932/11/11 Today's Date: 01/08/2018 Time: 1350-1407 SLP Time Calculation (min) (ACUTE ONLY): 17 min  Assessment / Plan / Recommendation Clinical Impression  Family reports good PO intake with lunch today.  Oldest daughter was present on SLP arrival but stepped out to get a meal.  With PO trials with SLP pt was noted to throat clear frequently; however, this was noted during yesterday's MBSS as well in absence of penetration/aspiration.  Pt exhibited good oral clearance of solids.  Pt required consistent verbal cuing for double swallow.  Straw was noted in one cup on table in front of pt, but this drink may have been for family member.  Straw removed from room.  Introduced swallowing exercise program.  Pt completed exercises as noted below.  Pt was able to follow directions, but was unable to execute some exercises despite cuing and encoragement.  Pharyngeal strengthening exercises may be of limited benefit based on pt's ability to participate.  CTAR No of reps: 10 Cuing: Mod Comment: Fair-good effort accuracy  Effortful swallow No of reps: 10 Cuing: Min  [place] No of reps: 0 - pt unable to execute despite max cuing  [place] No of reps: 0 - pt unable to execute despite max cuing  [place] No of reps: 0 - not attempted as pt was upright in chair   HPI HPI: Pt is an 84 y.o. female admitted for status epilepticus requiring intubation 12/12-12/16. MRI negative for acute changes. Pt with significant PMH of stroke, obesity, GERD, SBO, HTN, DM, anemia (iron deficient), and A-fib.  MBSS 12/18 with recommendations for regular/thin with aspirartion precautions      SLP Plan  Continue with current plan of care       Recommendations  Diet recommendations: Regular;Thin liquid Liquids provided via: Cup;No straw Medication Administration: Whole meds with puree Supervision: Patient  able to self feed;Intermittent supervision to cue for compensatory strategies Compensations: Minimize environmental distractions;Slow rate;Small sips/bites;Multiple dry swallows after each bite/sip;Effortful swallow Postural Changes and/or Swallow Maneuvers: Seated upright 90 degrees                SLP Visit Diagnosis: Dysphagia, oropharyngeal phase (R13.12) Plan: Continue with current plan of care       [place], [place], [place] Office: [phone]; Pager (12/19): [phone] 01/08/2018, 2:40 PM

## 2018-01-08 NOTE — Consult Note (Signed)
   [facility] CM Inpatient Consult   01/08/2018  [name] October 31, 1932 [id number]  Patient screened for extreme high rosk score for unplanned readmissions [facility] plan.   Patient's chart review and notes reveals [name] is an 84 year old right-handed female with history of diabetes mellitus, hypertension, CVA, atrial fibrillation maintained on xarelto as well as small bowel obstruction. Per chart review patient lives alone. Independent with assistive device prior to admission. One level home. Plans to stay with her daughter and assistance as needed on discharge per inpatient rehab notes.  Patient's to transition to inpatient rehab. No current [facility] Care Management needs noted at current time.  For questions or referrals please contact:   Natividad Brood, RN BSN [facility] Liaison  [phone] business mobile phone Toll free office [phone]

## 2018-01-08 NOTE — Progress Notes (Signed)
PROGRESS NOTE                                                                                                                                                                                                             Patient Demographics:    [name], is a 84 y.o. female, DOB - 08-28-1932, CNO:[id number]  Admit date - 01/01/2018   Admitting Physician Renee Pain, MD  Outpatient Primary MD for the patient is Charolette Forward, MD  LOS - 7   Chief Complaint  Patient presents with  . Fall  . Head Injury       Brief Narrative    84 year old female with past medical history of diabetes and hypertension admitted 12/12 for status epilepticus requiring intubation.  Initially felt secondary to prescription uncontrolled blood pressure, but there is no evidence of that on MRI, felt to be secondary to hyperglycemia, patient was successfully extubated, transferred to [facility] service 01/07/2018  12/11 to ED for slurred speech/TIA. Discharged 12/12 to ED found down. Seizures. Intubated  12/14 Cervical spine cleared, no further seizure activity.  PSV  12/15 No cuff leak on exam   Subjective:    [name] today has, No headache, No chest pain, No abdominal pain - No Nausea,No Cough - SOB.    Assessment  & Plan :    Principal Problem:   Status epilepticus ([place]) Active Problems:   Accelerated hypertension   Acute kidney injury ([place])   Type 2 diabetes mellitus ([place])   Respiratory failure ([place])  Acute respiratory failure -Required intubation on admission as well as not able to protect her airways, required intubation, was kept in ICU, successfully extubated, respiratory failure has resolved, currently on room air.  Status epilepticus  -No seizure history.  Required Intubation on presentation for airway protection, felt by neurology most likely due to hyperglycemia, no evidence of present on repeat MRI 01/02/2018 . -Continue with Keppra 500 mg  oral twice daily   History of CVA and TIA - on Xarelto, MRI this admission with no evidence of acute CVA  Paroxysmal A. Fib -Heart rate controlled, continue with Cardizem, will continue with Xarelto for anticoagulation  Diabetes mellitus -Had some hypoglycemic episode, CBG much improved after decreasing her Levemir, continue with 16 units twice daily, continue with insulin sliding scale   Hypertension  -Blood pressure acceptable, continue  with home meds   CKD stage II -Elevated at 1.88 on admission, baseline around 1.1, resolved   Code Status : Full  Family Communication  : Daughter at bedside  Disposition Plan  : CIR consulted  Consults  : PCCM, neurology, inpatient rehab  Procedures  : None  DVT Prophylaxis  : Xarelto  Lab Results  Component Value Date   PLT 400 01/07/2018    Antibiotics  :    Anti-infectives (From admission, onward)   None        Objective:   Vitals:   01/07/18 2120 01/08/18 0100 01/08/18 0500 01/08/18 0524  BP: (!) 157/84   (!) 156/64  Pulse: 89   80  Resp: 19   20  Temp: 97.8 F (36.6 C)   98.4 F (36.9 C)  TempSrc: Oral   Oral  SpO2: 98% 98%  98%  Weight:   87.9 kg   Height:        Wt Readings from Last 3 Encounters:  01/08/18 87.9 kg  12/31/17 81.6 kg  11/06/17 82.6 kg     Intake/Output Summary (Last 24 hours) at 01/08/2018 1347 Last data filed at 01/08/2018 0648 Gross per 24 hour  Intake 180 ml  Output 2050 ml  Net -1870 ml     Physical Exam  Awake Alert, Oriented X 3, No new F.N deficits, Normal affect Symmetrical Chest wall movement, Good air movement bilaterally, CTAB RRR,No Gallops,Rubs or new Murmurs, No Parasternal Heave +ve B.Sounds, Abd Soft, No tenderness, No rebound - guarding or rigidity. No Cyanosis, Clubbing or edema, No new Rash or bruise       Data Review:    CBC Recent Labs  Lab 01/02/18 0612 01/03/18 0651 01/04/18 0314 01/05/18 0624 01/06/18 0259 01/07/18 0330  WBC 13.4* 14.0*  9.1 11.7* 17.1* 12.7*  HGB 12.1 11.4* 9.4* 10.6* 9.7* 10.4*  HCT 37.9 36.2 32.9* 34.0* 30.6* 31.9*  PLT 370 320 248 376 401* 400  MCV 82.6 84.6 78.5* 83.5 82.5 82.9  MCH 26.4 26.6 22.4* 26.0 26.1 27.0  MCHC 31.9 31.5 28.6* 31.2 31.7 32.6  RDW 15.8* 16.3* 14.7 16.4* 16.0* 16.3*  LYMPHSABS 2.3 3.0  --   --   --   --   MONOABS 1.3* 1.8*  --   --   --   --   EOSABS 0.0 0.2  --   --   --   --   BASOSABS 0.0 0.1  --   --   --   --     Chemistries  Recent Labs  Lab 01/03/18 0651 01/04/18 0314 01/05/18 0318 01/06/18 0259 01/07/18 0330  NA 137 138 138 139 138  K 4.3 4.1 5.4* 5.0 4.4  CL 106 105 107 107 106  CO2 21* 23 18* 22 25  GLUCOSE 302* 180* 290* 160* 105*  BUN 20 22 29* 39* 41*  CREATININE 1.28* 1.03* 1.14* 1.15* 1.35*  CALCIUM 8.5* 8.8* 9.0 9.1 9.1  MG 1.9 2.0 1.8 2.0 2.0   ------------------------------------------------------------------------------------------------------------------ No results for input(s): CHOL, HDL, LDLCALC, TRIG, CHOLHDL, LDLDIRECT in the last 72 hours.  Lab Results  Component Value Date   HGBA1C 13.0 (H) 01/01/2018   ------------------------------------------------------------------------------------------------------------------ No results for input(s): TSH, T4TOTAL, T3FREE, THYROIDAB in the last 72 hours.  Invalid input(s): FREET3 ------------------------------------------------------------------------------------------------------------------ No results for input(s): VITAMINB12, FOLATE, FERRITIN, TIBC, IRON, RETICCTPCT in the last 72 hours.  Coagulation profile No results for input(s): INR, PROTIME in the last 168 hours.  No results for input(s):  DDIMER in the last 72 hours.  Cardiac Enzymes No results for input(s): CKMB, TROPONINI, MYOGLOBIN in the last 168 hours.  Invalid input(s): CK ------------------------------------------------------------------------------------------------------------------ No results found for:  BNP  Inpatient Medications  Scheduled Meds: . cloNIDine  0.2 mg Oral BID  . diltiazem  30 mg Oral Q8H  . docusate sodium  100 mg Oral BID  . insulin aspart  0-5 Units Subcutaneous QHS  . insulin aspart  0-9 Units Subcutaneous TID WC  . insulin detemir  16 Units Subcutaneous BID  . levETIRAcetam  500 mg Oral BID  . mouth rinse  15 mL Mouth Rinse BID  . pantoprazole  40 mg Oral QHS  . polyethylene glycol  17 g Oral Daily  . protein supplement shake  11 oz Oral Daily  . rivaroxaban  15 mg Oral Q supper  . senna  1 tablet Oral Daily   Continuous Infusions: PRN Meds:.acetaminophen, bisacodyl, labetalol, magnesium hydroxide  Micro Results Recent Results (from the past 240 hour(s))  Culture, blood (Routine X 2) w Reflex to ID Panel     Status: None   Collection Time: 01/01/18  8:45 AM  Result Value Ref Range Status   Specimen Description BLOOD LEFT ANTECUBITAL  Final   Special Requests   Final    BOTTLES DRAWN AEROBIC AND ANAEROBIC Blood Culture adequate volume   Culture   Final    NO GROWTH 5 DAYS Performed at [facility], [place] [address], [place], [place] [address]    Report Status 01/06/2018 FINAL  Final  Culture, blood (Routine X 2) w Reflex to ID Panel     Status: None   Collection Time: 01/01/18  9:00 AM  Result Value Ref Range Status   Specimen Description BLOOD LEFT FOREARM  Final   Special Requests   Final    BOTTLES DRAWN AEROBIC AND ANAEROBIC Blood Culture adequate volume   Culture   Final    NO GROWTH 5 DAYS Performed at [facility], [place] [address] [place], [place] [address]    Report Status 01/06/2018 FINAL  Final  Culture, respiratory (non-expectorated)     Status: None   Collection Time: 01/01/18  9:42 AM  Result Value Ref Range Status   Specimen Description TRACHEAL ASPIRATE  Final   Special Requests NONE  Final   Gram Stain   Final    RARE WBC PRESENT, PREDOMINANTLY PMN ABUNDANT GRAM POSITIVE COCCI IN PAIRS IN CHAINS FEW GRAM POSITIVE  RODS RARE GRAM NEGATIVE RODS    Culture   Final    ABUNDANT Consistent with normal respiratory flora. Performed at [facility], [place] [address], [place], [place] [address]    Report Status 01/03/2018 FINAL  Final  MRSA PCR Screening     Status: None   Collection Time: 01/01/18  9:09 PM  Result Value Ref Range Status   MRSA by PCR NEGATIVE NEGATIVE Final    Comment:        The GeneXpert MRSA Assay (FDA approved for NASAL specimens only), is one component of a comprehensive MRSA colonization surveillance program. It is not intended to diagnose MRSA infection nor to guide or monitor treatment for MRSA infections. Performed at [facility], [place] [address] [place], [place] [address]     Radiology Reports Dg Abd 1 View  Result Date: 01/01/2018 CLINICAL DATA:  NG placement EXAM: ABDOMEN - 1 VIEW COMPARISON:  CT abdomen 07/25/2007 FINDINGS: NG tip in the gastric antrum. Distended large  and small bowel loops. Moderate stool in the right colon. Mild stool in the rectosigmoid. IMPRESSION: NG tube in the gastric antrum Adynamic ileus with retained stool in the colon. Moderate stool in the right colon. Electronically Signed   By: Franchot Gallo M.D.   On: 01/01/2018 16:55   Ct Head Wo Contrast  Result Date: 01/01/2018 CLINICAL DATA:  84 year old female with head trauma. EXAM: CT HEAD WITHOUT CONTRAST CT CERVICAL SPINE WITHOUT CONTRAST TECHNIQUE: Multidetector CT imaging of the head and cervical spine was performed following the standard protocol without intravenous contrast. Multiplanar CT image reconstructions of the cervical spine were also generated. COMPARISON:  Head CT dated 12/31/2017 an MRI dated 12/31/2017 FINDINGS: CT HEAD FINDINGS Brain: There is moderate age-related atrophy and chronic microvascular ischemic changes. There is no acute intracranial hemorrhage. No mass effect or midline shift. No extra-axial fluid collection. Vascular: No hyperdense vessel or unexpected  calcification. Skull: Normal. Negative for fracture or focal lesion. Sinuses/Orbits: Partial opacification of the right sphenoid sinus. The remainder of the visualized paranasal sinuses and mastoid air cells are clear. Other: An enteric tube is partially visualized. CT CERVICAL SPINE FINDINGS Alignment: No acute subluxation. Skull base and vertebrae: No acute fracture. Osteopenia. Soft tissues and spinal canal: No prevertebral fluid or swelling. No visible canal hematoma. Disc levels:  Degenerative changes primarily at C4-C6. Upper chest: Atherosclerotic calcification of the aortic arch. Heterogeneous and mildly enlarged thyroid gland with multiple hypodense nodules. Further evaluation with ultrasound on a nonemergent basis recommended. Other: An endotracheal and an enteric tube are partially visualized. IMPRESSION: 1. No acute intracranial hemorrhage. 2. No acute/traumatic cervical spine pathology. Electronically Signed   By: Anner Crete M.D.   On: 01/01/2018 06:28   Ct Head Wo Contrast  Result Date: 12/31/2017 CLINICAL DATA:  Slurred speech EXAM: CT HEAD WITHOUT CONTRAST TECHNIQUE: Contiguous axial images were obtained from the base of the skull through the vertex without intravenous contrast. COMPARISON:  CT brain 09/16/2016, MRI 04/09/2016 FINDINGS: Brain: No acute territorial infarction, hemorrhage, or intracranial mass is visualized. Atrophy with moderate to marked small vessel ischemic changes of the white matter. Chronic lacunar infarcts within the basal ganglia and cerebellum. Stable ventricle size. Vascular: No hyperdense vessels.  Carotid vascular calcification. Skull: Normal. Negative for fracture or focal lesion. Sinuses/Orbits: No acute finding. Other: None IMPRESSION: 1. No CT evidence for acute intracranial abnormality. 2. Atrophy and small vessel ischemic changes of the white matter. Electronically Signed   By: Donavan Foil M.D.   On: 12/31/2017 18:35   Ct Cervical Spine Wo  Contrast  Result Date: 01/01/2018 CLINICAL DATA:  84 year old female with head trauma. EXAM: CT HEAD WITHOUT CONTRAST CT CERVICAL SPINE WITHOUT CONTRAST TECHNIQUE: Multidetector CT imaging of the head and cervical spine was performed following the standard protocol without intravenous contrast. Multiplanar CT image reconstructions of the cervical spine were also generated. COMPARISON:  Head CT dated 12/31/2017 an MRI dated 12/31/2017 FINDINGS: CT HEAD FINDINGS Brain: There is moderate age-related atrophy and chronic microvascular ischemic changes. There is no acute intracranial hemorrhage. No mass effect or midline shift. No extra-axial fluid collection. Vascular: No hyperdense vessel or unexpected calcification. Skull: Normal. Negative for fracture or focal lesion. Sinuses/Orbits: Partial opacification of the right sphenoid sinus. The remainder of the visualized paranasal sinuses and mastoid air cells are clear. Other: An enteric tube is partially visualized. CT CERVICAL SPINE FINDINGS Alignment: No acute subluxation. Skull base and vertebrae: No acute fracture. Osteopenia. Soft tissues and spinal canal: No prevertebral fluid  or swelling. No visible canal hematoma. Disc levels:  Degenerative changes primarily at C4-C6. Upper chest: Atherosclerotic calcification of the aortic arch. Heterogeneous and mildly enlarged thyroid gland with multiple hypodense nodules. Further evaluation with ultrasound on a nonemergent basis recommended. Other: An endotracheal and an enteric tube are partially visualized. IMPRESSION: 1. No acute intracranial hemorrhage. 2. No acute/traumatic cervical spine pathology. Electronically Signed   By: Anner Crete M.D.   On: 01/01/2018 06:28   Mr Brain Wo Contrast  Result Date: 01/03/2018 CLINICAL DATA:  New onset of seizure.  Re-evaluate for PRES. EXAM: MRI HEAD WITHOUT CONTRAST TECHNIQUE: Multiplanar, multiecho pulse sequences of the brain and surrounding structures were obtained  without intravenous contrast. COMPARISON:  MR brain 12/31/2017. FINDINGS: Brain: No evidence for acute infarction, hemorrhage, mass lesion, hydrocephalus, or extra-axial fluid. Generalized atrophy. Advanced chronic microvascular ischemic change. Unchanged small parafalcine arachnoid cyst. No imaging findings conclusive for PRES. Chronic BILATERAL cerebellar infarcts. Coronal imaging demonstrates no asymmetry, mass, or inflammatory process affecting the temporal lobes. Vascular: Flow voids are maintained. Chronic microhemorrhages are stable. Skull and upper cervical spine: Normal marrow signal. Sinuses/Orbits: No acute findings. RIGHT cataract extraction. Chronic sinus disease. Other: None. IMPRESSION: Advanced changes of atrophy and small vessel disease with chronic microhemorrhages and scattered lacunar infarcts. Stable exam from 12/31/2017. No acute intracranial abnormality. No features to suggest interval development of PRES. Electronically Signed   By: Staci Righter M.D.   On: 01/03/2018 12:25   Mr Brain Wo Contrast  Result Date: 12/31/2017 CLINICAL DATA:  Recurrent slurred speech this afternoon. History of hypertension and diabetes. EXAM: MRI HEAD WITHOUT CONTRAST TECHNIQUE: Multiplanar, multiecho pulse sequences of the brain and surrounding structures were obtained without intravenous contrast. COMPARISON:  CT HEAD December 31, 2017 FINDINGS: INTRACRANIAL CONTENTS: No reduced diffusion to suggest acute ischemia. No susceptibility artifact to suggest hemorrhage. Old bilateral cerebellar infarcts. Confluent supratentorial white matter FLAIR T2 hyperintensities. No midline shift, mass effect or masses. Scattered cerebellar and to lesser extent central cerebrum more chronic microhemorrhages. Prominent basal ganglia and thalami perivascular spaces associated with chronic hypertension. Moderate parenchymal brain volume loss. No hydrocephalus. No abnormal extra-axial fluid collections. Small RIGHT parafalcine  arachnoid cyst. VASCULAR: Normal major intracranial vascular flow voids present at skull base. SKULL AND UPPER CERVICAL SPINE: No abnormal sellar expansion. No suspicious calvarial bone marrow signal. Craniocervical junction maintained. SINUSES/ORBITS: The mastoid air-cells and included paranasal sinuses are well-aerated.The included ocular globes and orbital contents are non-suspicious. Status post RIGHT ocular lens implant. OTHER: LEFT nasal skin nodule.  Recommend direct inspection. IMPRESSION: 1. No acute intracranial process. 2. Multiple old cerebellar infarcts. 3. Moderate to severe chronic small vessel ischemic changes. Electronically Signed   By: Elon Alas M.D.   On: 12/31/2017 20:26   Mr Cervical Spine Wo Contrast  Result Date: 01/02/2018 CLINICAL DATA:  Unresponsive patient.  Found face down. EXAM: MRI CERVICAL SPINE WITHOUT CONTRAST TECHNIQUE: Multiplanar, multisequence MR imaging of the cervical spine was performed. No intravenous contrast was administered. COMPARISON:  Brain MRI 12/31/2017 FINDINGS: Alignment: Normal. Vertebrae: No focal marrow lesion. No compression fracture or evidence of discitis osteomyelitis. Cord: Normal caliber and signal. Posterior Fossa, vertebral arteries, paraspinal tissues: Visualized posterior fossa is normal. Vertebral artery flow voids are preserved. No prevertebral effusion. Disc levels: Sagittal imaging includes the atlantoaxial joint to the level of the T2-3 disc space, with axial imaging of the disc spaces from C2-3 to C7-T1. There is no spinal canal stenosis. Small disc bulges at C3-4, C4-5 and C5-6 with  associated uncovertebral hypertrophy. There is severe left neural foraminal stenosis at C4-5 and C5-6. IMPRESSION: 1. No acute abnormality of the cervical spine. Normal appearance of the spinal cord. 2. Severe left neural foraminal stenosis at C4-5 and C5-6. Electronically Signed   By: Ulyses Jarred M.D.   On: 01/02/2018 18:54   Dg Chest Port 1  View  Result Date: 01/04/2018 CLINICAL DATA:  Acute respiratory failure EXAM: PORTABLE CHEST 1 VIEW COMPARISON:  January 03, 2018 FINDINGS: The ETT terminates 18 mm above the carina. Recommend withdrawing 1 cm. The enteric tube terminates below today's film. No pneumothorax. The cardiomediastinal silhouette is stable. No other interval changes. IMPRESSION: 1. The ETT terminates 18 mm above the carina. Recommend withdrawing 1 cm. 2. No other interval changes or acute abnormalities are noted. These results will be called to the ordering clinician or representative by the Radiologist Assistant, and communication documented in the PACS or zVision Dashboard. Electronically Signed   By: Dorise Bullion III M.D   On: 01/04/2018 08:07   Dg Chest Port 1 View  Result Date: 01/03/2018 CLINICAL DATA:  Respiratory failure. EXAM: PORTABLE CHEST 1 VIEW COMPARISON:  Radiograph of January 02, 2018. FINDINGS: Endotracheal tube tip is seen right at the carina; withdrawal by 2-3 cm is recommended. Nasogastric tube is unchanged in position. No pneumothorax or significant pleural effusion is noted. No acute pulmonary disease is noted. Bony thorax is unremarkable. IMPRESSION: Endotracheal tube tip is seen at the carina; withdrawal by 2-3 cm is recommended. No acute abnormality seen in the chest. These results will be called to the ordering clinician or representative by the Radiologist Assistant, and communication documented in the PACS or zVision Dashboard. Electronically Signed   By: Marijo Conception, M.D.   On: 01/03/2018 08:05   Dg Chest Port 1 View  Result Date: 01/02/2018 CLINICAL DATA:  Respiratory failure EXAM: PORTABLE CHEST 1 VIEW COMPARISON:  Yesterday FINDINGS: Endotracheal tube tip is 18 mm above the carina. An orogastric tube reaches the stomach. Cardiomegaly. Indistinct infrahilar opacities. No Kerley lines or pneumothorax. IMPRESSION: 1. Unremarkable hardware positioning. 2. Infrahilar opacities favoring  atelectasis. Electronically Signed   By: Monte Fantasia M.D.   On: 01/02/2018 07:44   Dg Chest Port 1 View  Result Date: 01/01/2018 CLINICAL DATA:  84 y/o F; post intubation and enteric tube placement. EXAM: PORTABLE CHEST 1 VIEW COMPARISON:  01/01/2018 chest radiograph. FINDINGS: Stable normal cardiac silhouette given projection and technique. Aortic atherosclerosis with calcification. Enteric tube tip projects 2.1 cm above the carina. Enteric tube tip extends the proximal stomach, the proximal side hole is in the distal esophagus. Linear opacity at left lung base compatible with atelectasis. No pleural effusion or pneumothorax. No acute osseous abnormality identified. IMPRESSION: 1. Enteric tube tip 2.1 cm above carina. Endotracheal tube tip in proximal stomach, approximately 5 cm advancement recommended. 2. Left basilar atelectasis. Electronically Signed   By: Kristine Garbe M.D.   On: 01/01/2018 06:23   Dg Chest Port 1 View  Result Date: 01/01/2018 CLINICAL DATA:  84 year old female with altered mental status. EXAM: PORTABLE CHEST 1 VIEW COMPARISON:  Chest radiograph dated 07/25/2017 FINDINGS: Shallow inspiration. No focal consolidation, pleural effusion, or pneumothorax. Mild cardiomegaly. Atherosclerotic calcification of the aortic arch. No acute osseous pathology. Left upper quadrant surgical clips noted. There is gaseous distention of the visualized colon. IMPRESSION: No acute cardiopulmonary process. Electronically Signed   By: Anner Crete M.D.   On: 01/01/2018 05:29   Dg Swallowing Func-speech Pathology  Result  Date: 01/07/2018 Objective Swallowing Evaluation: Type of Study: MBS-Modified Barium Swallow Study  Patient Details Name: TONJIA PARILLO MRN: [id number] Date of Birth: 1932/02/26 Today's Date: 01/07/2018 Time: SLP Start Time (ACUTE ONLY): 1300 -SLP Stop Time (ACUTE ONLY): 1329 SLP Time Calculation (min) (ACUTE ONLY): 29 min Past Medical History: Past Medical History:  Diagnosis Date . A-fib [facility]) 2019  Per patients daughter, [name] [phone] . ANEMIA, IRON DEFICIENCY 08/11/2008 . ANGIOEDEMA 03/01/2008 . CEREBROVASCULAR ACCIDENT, HX OF 03/01/2008 . CONTACT DERMATITIS&OTHER ECZEMA DUE UNSPEC CAUSE 11/28/2008 . DIABETES MELLITUS 03/01/2008 . Dysuria 01/04/2010 . GERD 03/01/2008 . HYPERSOMNIA 05/30/2009 . HYPERTENSION 03/01/2008 . INTERNAL HEMORRHOIDS 03/01/2008 . KNEE PAIN, RIGHT 09/05/2009 . NONSPECIFIC ABN FINDNG RAD&OTH EXAM BILARY TRCT 03/07/2008 . OBESITY 01/10/2010 . PERIPHERAL EDEMA 08/10/2008 . POLYP, GALLBLADDER 03/07/2008 . SMALL BOWEL OBSTRUCTION, HX OF 03/01/2008 . SNORING 05/11/2009 . Stroke ([place]) 04/08/2016  TIA . THROMBOCYTOSIS 08/18/2008 Past Surgical History: Past Surgical History: Procedure Laterality Date . HERNIA REPAIR  09/14/08  with release [facility]-Dr. Dalbert Batman . SMALL INTESTINE SURGERY   . 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 female admitted for status epilepticus requiring intubation 12/12-12/16.  MRI negative for acute changes. Pt with significant PMH of stroke, obesity, GERD, SBO, HTN, DM, anemia (iron deficient), and A-fib.  Subjective: pt denies any dysphagia Assessment / Plan / Recommendation CHL IP CLINICAL IMPRESSIONS 01/07/2018 Clinical Impression Patient present with pharyngeal dysphagia likely secondary from intubation. Oral phase of swallow was within normal limits. She had delayed swallow at the pyriform sinuses with all thin liquid boluses and at the vallecule with purees and solids. Reduced epiglottic inversion with thins, but near completion with purees and solids. Reduced pharyngeal peristalis and reduced laryngeal elevation resulting in pharyngeal residue in valleculae and pyriform sinuses with all consistencies. Patient had no penetration or aspiration with any consistency, however she did have clearing throughout exam after flouro was cut off. Suspect clearing was due to residue. In esophageal  sweep, the esophagus was clear and no reflux noted. Recommend patient continue regular diet with thin liquids, with no straw. Aspiration precautions of sitting up as straight as possible for all meals, taking small sips and bites with a second swallow. Speech therapy to follow to monitor diet tolerance, pharyngeal strengthening exercises as well as patient and family education.  SLP Visit Diagnosis Dysphagia, pharyngeal phase (R13.13) Attention and concentration deficit following -- Frontal lobe and executive function deficit following -- Impact on safety and function Mild aspiration risk   CHL IP TREATMENT RECOMMENDATION 01/07/2018 Treatment Recommendations Therapy as outlined in treatment plan below   Prognosis 01/07/2018 Prognosis for Safe Diet Advancement Good Barriers to Reach Goals -- Barriers/Prognosis Comment -- CHL IP DIET RECOMMENDATION 01/07/2018 SLP Diet Recommendations Regular solids;Thin liquid Liquid Administration via Cup;No straw Medication Administration Whole meds with puree Compensations Minimize environmental distractions;Slow rate;Small sips/bites;Effortful swallow Postural Changes Seated upright at 90 degrees   CHL IP OTHER RECOMMENDATIONS 01/07/2018 Recommended Consults -- Oral Care Recommendations Oral care BID Other Recommendations --   CHL IP FOLLOW UP RECOMMENDATIONS 01/06/2018 Follow up Recommendations (No Data)   CHL IP FREQUENCY AND DURATION 01/07/2018 Speech Therapy Frequency (ACUTE ONLY) min 2x/week Treatment Duration 1 week      CHL IP ORAL PHASE 01/07/2018 Oral Phase WFL Oral - Pudding Teaspoon -- Oral - Pudding Cup -- Oral - Honey Teaspoon -- Oral - Honey Cup -- Oral - Nectar Teaspoon -- Oral - Nectar Cup --  Oral - Nectar Straw -- Oral - Thin Teaspoon -- Oral - Thin Cup -- Oral - Thin Straw -- Oral - Puree -- Oral - Mech Soft -- Oral - Regular -- Oral - Multi-Consistency -- Oral - Pill -- Oral Phase - Comment --  CHL IP PHARYNGEAL PHASE 01/07/2018 Pharyngeal Phase Impaired  Pharyngeal- Pudding Teaspoon Delayed swallow initiation-vallecula;Reduced epiglottic inversion;Reduced laryngeal elevation;Reduced tongue base retraction;Pharyngeal residue - valleculae;Pharyngeal residue - pyriform;Reduced pharyngeal peristalsis Pharyngeal -- Pharyngeal- Pudding Cup -- Pharyngeal -- Pharyngeal- Honey Teaspoon -- Pharyngeal -- Pharyngeal- Honey Cup -- Pharyngeal -- Pharyngeal- Nectar Teaspoon -- Pharyngeal -- Pharyngeal- Nectar Cup -- Pharyngeal -- Pharyngeal- Nectar Straw -- Pharyngeal -- Pharyngeal- Thin Teaspoon -- Pharyngeal -- Pharyngeal- Thin Cup Delayed swallow initiation-pyriform sinuses;Reduced epiglottic inversion;Reduced anterior laryngeal mobility;Reduced laryngeal elevation;Reduced airway/laryngeal closure;Reduced tongue base retraction;Pharyngeal residue - valleculae;Pharyngeal residue - pyriform;Reduced pharyngeal peristalsis Pharyngeal -- Pharyngeal- Thin Straw Delayed swallow initiation-pyriform sinuses;Reduced epiglottic inversion;Reduced anterior laryngeal mobility;Reduced laryngeal elevation;Reduced airway/laryngeal closure;Reduced tongue base retraction;Pharyngeal residue - valleculae;Pharyngeal residue - pyriform Pharyngeal -- Pharyngeal- Puree -- Pharyngeal -- Pharyngeal- Mechanical Soft Delayed swallow initiation-vallecula;Reduced pharyngeal peristalsis;Reduced anterior laryngeal mobility;Reduced laryngeal elevation;Reduced airway/laryngeal closure;Reduced tongue base retraction;Pharyngeal residue - valleculae;Pharyngeal residue - pyriform Pharyngeal -- Pharyngeal- Regular -- Pharyngeal -- Pharyngeal- Multi-consistency -- Pharyngeal -- Pharyngeal- Pill -- Pharyngeal -- Pharyngeal Comment --  CHL IP CERVICAL ESOPHAGEAL PHASE 01/07/2018 Cervical Esophageal Phase WFL Pudding Teaspoon -- Pudding Cup -- Honey Teaspoon -- Honey Cup -- Nectar Teaspoon -- Nectar Cup -- Nectar Straw -- Thin Teaspoon -- Thin Cup -- Thin Straw -- Puree -- Mechanical Soft -- Regular -- Multi-consistency  -- Pill -- Cervical Esophageal Comment -- Charlynne Cousins Ward, MA, CCC-SLP 01/07/2018 1:57 PM                 Phillips Climes M.D on 01/08/2018 at 1:47 PM  Between 7am to 7pm - Pager - [phone]  After 7pm go to [email or web address] - password [facility]  [facility] -  Office  [phone]

## 2018-01-08 NOTE — Progress Notes (Signed)
Patient had I/E wheezes on assessment. No distress noted. Daughter is now concerned d/t audible wheezes that patient did not have previous night. Continuous SpO2 in place. SpO2 has mainly been 92-94% on RA. Daughter states that SpO2 decreased to 80% x1 for a short amt. of time. Schorr, NP notified. Patient repositioned but repeatedly slides down and to the side in bed. Breathing tx ordered x1 dose. This RN notified daughter and encouraged daughter to notify this RN if breathing tx does not help patient. Will continue to monitor.

## 2018-01-09 ENCOUNTER — Inpatient Hospital Stay (HOSPITAL_COMMUNITY)
Admission: RE | Admit: 2018-01-09 | Discharge: 2018-01-13 | DRG: 071 | Disposition: A | Discharge status: Home Health | Payer: Medicare HMO | Source: Intra-hospital | Attending: Physical Medicine & Rehabilitation | Admitting: Physical Medicine & Rehabilitation

## 2018-01-09 ENCOUNTER — Other Ambulatory Visit: Payer: Self-pay

## 2018-01-09 ENCOUNTER — Encounter (HOSPITAL_COMMUNITY): Payer: Self-pay

## 2018-01-09 DIAGNOSIS — G40909 Epilepsy, unspecified, not intractable, without status epilepticus: Secondary | ICD-10-CM | POA: Diagnosis not present

## 2018-01-09 DIAGNOSIS — E162 Hypoglycemia, unspecified: Secondary | ICD-10-CM

## 2018-01-09 DIAGNOSIS — Z79899 Other long term (current) drug therapy: Secondary | ICD-10-CM

## 2018-01-09 DIAGNOSIS — Z6835 Body mass index (BMI) 35.0-35.9, adult: Secondary | ICD-10-CM

## 2018-01-09 DIAGNOSIS — I480 Paroxysmal atrial fibrillation: Secondary | ICD-10-CM

## 2018-01-09 DIAGNOSIS — Z8673 Personal history of transient ischemic attack (TIA), and cerebral infarction without residual deficits: Secondary | ICD-10-CM

## 2018-01-09 DIAGNOSIS — E46 Unspecified protein-calorie malnutrition: Secondary | ICD-10-CM

## 2018-01-09 DIAGNOSIS — E8809 Other disorders of plasma-protein metabolism, not elsewhere classified: Secondary | ICD-10-CM | POA: Diagnosis present

## 2018-01-09 DIAGNOSIS — N179 Acute kidney failure, unspecified: Secondary | ICD-10-CM | POA: Diagnosis not present

## 2018-01-09 DIAGNOSIS — E1165 Type 2 diabetes mellitus with hyperglycemia: Secondary | ICD-10-CM | POA: Diagnosis not present

## 2018-01-09 DIAGNOSIS — Z9081 Acquired absence of spleen: Secondary | ICD-10-CM

## 2018-01-09 DIAGNOSIS — E1142 Type 2 diabetes mellitus with diabetic polyneuropathy: Secondary | ICD-10-CM

## 2018-01-09 DIAGNOSIS — R0989 Other specified symptoms and signs involving the circulatory and respiratory systems: Secondary | ICD-10-CM | POA: Diagnosis not present

## 2018-01-09 DIAGNOSIS — Z833 Family history of diabetes mellitus: Secondary | ICD-10-CM

## 2018-01-09 DIAGNOSIS — G9340 Encephalopathy, unspecified: Secondary | ICD-10-CM | POA: Diagnosis not present

## 2018-01-09 DIAGNOSIS — G9349 Other encephalopathy: Principal | ICD-10-CM | POA: Diagnosis present

## 2018-01-09 DIAGNOSIS — Z9049 Acquired absence of other specified parts of digestive tract: Secondary | ICD-10-CM

## 2018-01-09 DIAGNOSIS — Z882 Allergy status to sulfonamides status: Secondary | ICD-10-CM

## 2018-01-09 DIAGNOSIS — I4891 Unspecified atrial fibrillation: Secondary | ICD-10-CM | POA: Diagnosis not present

## 2018-01-09 DIAGNOSIS — D72829 Elevated white blood cell count, unspecified: Secondary | ICD-10-CM | POA: Diagnosis not present

## 2018-01-09 DIAGNOSIS — K219 Gastro-esophageal reflux disease without esophagitis: Secondary | ICD-10-CM | POA: Diagnosis not present

## 2018-01-09 DIAGNOSIS — Z794 Long term (current) use of insulin: Secondary | ICD-10-CM

## 2018-01-09 DIAGNOSIS — R5381 Other malaise: Secondary | ICD-10-CM | POA: Diagnosis present

## 2018-01-09 DIAGNOSIS — N183 Chronic kidney disease, stage 3 unspecified: Secondary | ICD-10-CM

## 2018-01-09 DIAGNOSIS — Z880 Allergy status to penicillin: Secondary | ICD-10-CM

## 2018-01-09 DIAGNOSIS — R7309 Other abnormal glucose: Secondary | ICD-10-CM

## 2018-01-09 DIAGNOSIS — Z7901 Long term (current) use of anticoagulants: Secondary | ICD-10-CM | POA: Diagnosis not present

## 2018-01-09 DIAGNOSIS — J9690 Respiratory failure, unspecified, unspecified whether with hypoxia or hypercapnia: Secondary | ICD-10-CM | POA: Diagnosis not present

## 2018-01-09 DIAGNOSIS — I10 Essential (primary) hypertension: Secondary | ICD-10-CM | POA: Diagnosis present

## 2018-01-09 DIAGNOSIS — Z888 Allergy status to other drugs, medicaments and biological substances status: Secondary | ICD-10-CM | POA: Diagnosis not present

## 2018-01-09 DIAGNOSIS — Z713 Dietary counseling and surveillance: Secondary | ICD-10-CM | POA: Diagnosis not present

## 2018-01-09 DIAGNOSIS — D62 Acute posthemorrhagic anemia: Secondary | ICD-10-CM

## 2018-01-09 DIAGNOSIS — Z8249 Family history of ischemic heart disease and other diseases of the circulatory system: Secondary | ICD-10-CM | POA: Diagnosis not present

## 2018-01-09 LAB — GLUCOSE, CAPILLARY
Glucose-Capillary: 205 mg/dL — ABNORMAL HIGH (ref 70–99)
Glucose-Capillary: 298 mg/dL — ABNORMAL HIGH (ref 70–99)
Glucose-Capillary: 259 mg/dL — ABNORMAL HIGH (ref 70–99)
Glucose-Capillary: 178 mg/dL — ABNORMAL HIGH (ref 70–99)

## 2018-01-09 MED ORDER — POLYETHYLENE GLYCOL 3350 17 G PO PACK
17.0000 g | PACK | Freq: Every day | ORAL | Status: DC
  Administered 2018-01-09 – 2018-01-13 (×5): 17 g via ORAL
  Filled 2018-01-09 (×5): qty 1

## 2018-01-09 MED ORDER — LIVING WELL WITH DIABETES BOOK
Freq: Once | Status: AC
  Administered 2018-01-09: 22:00:00
  Filled 2018-01-09: qty 1

## 2018-01-09 MED ORDER — ENSURE MAX PROTEIN PO LIQD
11.0000 [oz_av] | Freq: Every day | ORAL | Status: DC
  Administered 2018-01-09 – 2018-01-13 (×5): 11 [oz_av] via ORAL
  Filled 2018-01-09 (×5): qty 330

## 2018-01-09 MED ORDER — INSULIN ASPART 100 UNIT/ML ~~LOC~~ SOLN
0.0000 [IU] | Freq: Three times a day (TID) | SUBCUTANEOUS | Status: DC
  Administered 2018-01-09: 5 [IU] via SUBCUTANEOUS
  Administered 2018-01-10: 9 [IU] via SUBCUTANEOUS
  Administered 2018-01-10: 1 [IU] via SUBCUTANEOUS
  Administered 2018-01-11: 7 [IU] via SUBCUTANEOUS
  Administered 2018-01-11: 2 [IU] via SUBCUTANEOUS
  Administered 2018-01-12: 1 [IU] via SUBCUTANEOUS
  Administered 2018-01-13: 7 [IU] via SUBCUTANEOUS
  Administered 2018-01-13: 1 [IU] via SUBCUTANEOUS

## 2018-01-09 MED ORDER — IPRATROPIUM-ALBUTEROL 0.5-2.5 (3) MG/3ML IN SOLN
3.0000 mL | Freq: Four times a day (QID) | RESPIRATORY_TRACT | Status: DC | PRN
  Administered 2018-01-10 – 2018-01-11 (×3): 3 mL via RESPIRATORY_TRACT
  Filled 2018-01-09 (×3): qty 3

## 2018-01-09 MED ORDER — LEVETIRACETAM 500 MG PO TABS: 500.0000 mg | ORAL_TABLET | Freq: Two times a day (BID) | ORAL | Status: DC

## 2018-01-09 MED ORDER — PANTOPRAZOLE SODIUM 40 MG PO TBEC
40.0000 mg | DELAYED_RELEASE_TABLET | Freq: Every day | ORAL | Status: DC
  Administered 2018-01-09 – 2018-01-12 (×4): 40 mg via ORAL
  Filled 2018-01-09 (×4): qty 1

## 2018-01-09 MED ORDER — LEVETIRACETAM 500 MG PO TABS
500.0000 mg | ORAL_TABLET | Freq: Two times a day (BID) | ORAL | Status: DC
  Administered 2018-01-09 – 2018-01-13 (×8): 500 mg via ORAL
  Filled 2018-01-09 (×8): qty 1

## 2018-01-09 MED ORDER — INSULIN DETEMIR 100 UNIT/ML ~~LOC~~ SOLN
16.0000 [IU] | Freq: Two times a day (BID) | SUBCUTANEOUS | Status: DC
  Administered 2018-01-09 – 2018-01-13 (×8): 16 [IU] via SUBCUTANEOUS
  Filled 2018-01-09 (×9): qty 0.16

## 2018-01-09 MED ORDER — INSULIN ASPART 100 UNIT/ML ~~LOC~~ SOLN: 0.0000 [IU] | Freq: Every day | SUBCUTANEOUS | 11 refills | Status: DC

## 2018-01-09 MED ORDER — ENSURE MAX PROTEIN PO LIQD
11.0000 [oz_av] | Freq: Every day | ORAL | Status: DC
  Administered 2018-01-09: 11 [oz_av] via ORAL
  Filled 2018-01-09: qty 330

## 2018-01-09 MED ORDER — BISACODYL 10 MG RE SUPP: 10.0000 mg | Freq: Every day | RECTAL | Status: DC | PRN

## 2018-01-09 MED ORDER — ACETAMINOPHEN 325 MG PO TABS: 650.0000 mg | ORAL_TABLET | Freq: Four times a day (QID) | ORAL | Status: DC | PRN

## 2018-01-09 MED ORDER — DILTIAZEM HCL ER COATED BEADS 120 MG PO CP24
120.0000 mg | ORAL_CAPSULE | Freq: Once | ORAL | Status: AC
  Administered 2018-01-09: 120 mg via ORAL
  Filled 2018-01-09: qty 1

## 2018-01-09 MED ORDER — POLYETHYLENE GLYCOL 3350 17 G PO PACK
34.0000 g | PACK | Freq: Once | ORAL | Status: AC
  Administered 2018-01-09: 34 g via ORAL
  Filled 2018-01-09: qty 2

## 2018-01-09 MED ORDER — RIVAROXABAN 15 MG PO TABS
15.0000 mg | ORAL_TABLET | Freq: Every day | ORAL | Status: DC
  Administered 2018-01-09 – 2018-01-12 (×4): 15 mg via ORAL
  Filled 2018-01-09 (×4): qty 1

## 2018-01-09 MED ORDER — INSULIN DETEMIR 100 UNIT/ML FLEXPEN: 18.0000 [IU] | PEN_INJECTOR | Freq: Two times a day (BID) | SUBCUTANEOUS | 11 refills | Status: DC

## 2018-01-09 MED ORDER — INSULIN DETEMIR 100 UNIT/ML FLEXPEN: 20.0000 [IU] | PEN_INJECTOR | Freq: Two times a day (BID) | SUBCUTANEOUS | 11 refills | Status: DC

## 2018-01-09 MED ORDER — SENNOSIDES-DOCUSATE SODIUM 8.6-50 MG PO TABS
2.0000 | ORAL_TABLET | Freq: Two times a day (BID) | ORAL | Status: DC
  Administered 2018-01-09: 2 via ORAL
  Filled 2018-01-09: qty 2

## 2018-01-09 MED ORDER — IPRATROPIUM-ALBUTEROL 0.5-2.5 (3) MG/3ML IN SOLN: 3.0000 mL | Freq: Four times a day (QID) | RESPIRATORY_TRACT | Status: DC | PRN

## 2018-01-09 MED ORDER — CLONIDINE HCL 0.2 MG PO TABS
0.2000 mg | ORAL_TABLET | Freq: Two times a day (BID) | ORAL | Status: DC
  Administered 2018-01-09 – 2018-01-13 (×8): 0.2 mg via ORAL
  Filled 2018-01-09 (×8): qty 1

## 2018-01-09 MED ORDER — PANTOPRAZOLE SODIUM 40 MG PO TBEC: 40.0000 mg | DELAYED_RELEASE_TABLET | Freq: Every day | ORAL | Status: DC

## 2018-01-09 MED ORDER — SENNOSIDES-DOCUSATE SODIUM 8.6-50 MG PO TABS: 2.0000 | ORAL_TABLET | Freq: Two times a day (BID) | ORAL | Status: AC

## 2018-01-09 MED ORDER — INSULIN ASPART 100 UNIT/ML ~~LOC~~ SOLN: 0.0000 [IU] | Freq: Three times a day (TID) | SUBCUTANEOUS | 11 refills | Status: DC

## 2018-01-09 MED ORDER — POLYETHYLENE GLYCOL 3350 17 G PO PACK: 17.0000 g | PACK | Freq: Every day | ORAL | 0 refills | Status: AC

## 2018-01-09 MED ORDER — SORBITOL 70 % SOLN: 30.0000 mL | Freq: Every day | Status: DC | PRN

## 2018-01-09 MED ORDER — ACETAMINOPHEN 325 MG PO TABS
650.0000 mg | ORAL_TABLET | Freq: Four times a day (QID) | ORAL | Status: DC | PRN
  Administered 2018-01-09: 650 mg via ORAL

## 2018-01-09 NOTE — Progress Notes (Signed)
CSW notes patient is discharging to CIR today. CSW signing off. Please re-consult for further needs.   Percell Locus Tine Mabee LCSW [phone]

## 2018-01-09 NOTE — Progress Notes (Signed)
PMR Admission Coordinator Pre-Admission Assessment  Patient: [name] is an 84 y.o., female MRN: [id number] DOB: 1932-11-13 Height: '5\' 1"'  (154.9 cm) Weight: 85 kg                                                                                                                                                  Insurance Information HMO: yes    PPO:      PCP:      IPA:      80/20:      OTHER:  PRIMARY: [employer]      Policy#: [id number]      Subscriber: Patient CM Name: [name]      Phone#: [phone]     Fax#: [phone] Pre-Cert#: [id number]      Employer:  [employer]auf[employer] provided by [name] on 12/19 for admit to CIR on 12/20. Clinical updates are due weekly to [name] with first update due 01/14/18. [name]'s (p): [phone] [id number] (f): [phone] Benefits:  Phone #: NA     Name: [email or web address] Eff. Date: 01/21/17 (per family it appears it will term on 01/20/18)     Deduct: $0      Out of Pocket Max: $3,400 (met $1,460.15)      Life Max: NA CIR: $295/day for days 1-6; $0/day for days 7+      SNF: $0/day for days 1-20, $178/day for days 21-100; 100 day limit Outpatient: per necessity     Co-Pay: $10-40 pending charge Home Health: 100%, per necessity      Co-Pay:  DME: 80%     Co-Pay: 20% Providers:  SECONDARY:       Policy#:       Subscriber:  CM Name:       Phone#:      Fax#:  Pre-Cert#:       Employer:  Benefits:  Phone #:      Name:  Eff. Date:      Deduct:       Out of Pocket Max:       Life Max:  CIR:       SNF:  Outpatient:      Co-Pay:  Home Health:       Co-Pay:  DME:      Co-Pay:  *Pt will have a policy with [employer] Medicare starting 01/21/18. Member ID: [id number] (per [facility] Medicare).  Per family, her [facility] Medicare will terminate on 01/20/18.   Medicaid Application Date:      Case Manager:  Disability Application Date:      Case Worker:   Emergency Contact Information         Contact Information    Name Relation Home Work Mobile   [name]  Daughter   [phone]     Current Medical History  Patient Admitting Diagnosis: Functional deficits due to seizure and encephalopathy. History  of Present Illness: [name] is an 84 year old right-handed female with history of diabetes mellitus, hypertension, CVA, atrial fibrillation maintained on xarelto as well as small bowel obstruction. Per chart review patient lives alone. Independent with assistive device prior to admission. One level home. Plans to stay with her daughter and assistance as needed on discharge. Presented 01/01/2018 with slurred speech as well as question seizure. CT/MRI showed no acute intracranial process. 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 Noted blood sugar 596 and blood pressure in the 200s. EEG with no seizure. Findings consistent with encephalopathy. Patient was loaded with Keppra for seizure prophylaxis. She did require intubation until 01/05/2018. Therapy evaluations completed with recommendations of physical medicine rehabilitation consult. Patient was admitted for a comprehensive rehabilitation program. She is currently tolerating a regular consistency diet. She remains on Xarelto has prior to admission. Therapy evaluations completed with recommendations of physical medicine rehabilitation consult. Patient is to be admitted for a comprehensive rehabilitation program on 01/08/18.  Past Medical History      Past Medical History:  Diagnosis Date  . A-fib [facility]) 2019   Per patients daughter, [name] [phone]  . ANEMIA, IRON DEFICIENCY 08/11/2008  . ANGIOEDEMA 03/01/2008  . CEREBROVASCULAR ACCIDENT, HX OF 03/01/2008  . CONTACT DERMATITIS&OTHER ECZEMA DUE UNSPEC CAUSE 11/28/2008  . DIABETES MELLITUS 03/01/2008  . Dysuria 01/04/2010  . GERD 03/01/2008  . HYPERSOMNIA 05/30/2009  . HYPERTENSION 03/01/2008  . INTERNAL HEMORRHOIDS 03/01/2008  . KNEE PAIN, RIGHT 09/05/2009  . NONSPECIFIC ABN FINDNG RAD&OTH EXAM BILARY TRCT 03/07/2008  . OBESITY 01/10/2010  .  PERIPHERAL EDEMA 08/10/2008  . POLYP, GALLBLADDER 03/07/2008  . SMALL BOWEL OBSTRUCTION, HX OF 03/01/2008  . SNORING 05/11/2009  . Stroke ([place]) 04/08/2016   TIA  . THROMBOCYTOSIS 08/18/2008    Family History  family history includes Allergies in an other family member; Asthma in an other family member; Breast cancer in an other family member; Colitis in her sister; Diabetes in her mother and sister; Heart disease in her mother and sister; Kidney disease in her brother.  Prior Rehab/Hospitalizations:  Has the patient had major surgery during 100 days prior to admission? No  Current Medications   Current Facility-Administered Medications:  .  acetaminophen (TYLENOL) tablet 650 mg, 650 mg, Oral, Q6H PRN, Elgergawy, Silver Huguenin, MD, 650 mg at 01/09/18 0453 .  bisacodyl (DULCOLAX) suppository 10 mg, 10 mg, Rectal, Daily PRN, Ollis, Brandi L, NP, 10 mg at 01/05/18 1600 .  cloNIDine (CATAPRES) tablet 0.2 mg, 0.2 mg, Oral, BID, Collene Gobble, MD, 0.2 mg at 01/09/18 0849 .  diltiazem (CARDIZEM CD) 24 hr capsule 120 mg, 120 mg, Oral, Once, Elgergawy, Dawood S, MD .  insulin aspart (novoLOG) injection 0-5 Units, 0-5 Units, Subcutaneous, QHS, Elgergawy, Silver Huguenin, MD, 3 Units at 01/08/18 2142 .  insulin aspart (novoLOG) injection 0-9 Units, 0-9 Units, Subcutaneous, TID WC, Elgergawy, Silver Huguenin, MD, 3 Units at 01/09/18 0850 .  insulin detemir (LEVEMIR) injection 16 Units, 16 Units, Subcutaneous, BID, Elgergawy, Silver Huguenin, MD, 16 Units at 01/09/18 0849 .  ipratropium-albuterol (DUONEB) 0.5-2.5 (3) MG/3ML nebulizer solution 3 mL, 3 mL, Nebulization, Q6H PRN, Elgergawy, Silver Huguenin, MD, 3 mL at 01/09/18 0132 .  labetalol (NORMODYNE,TRANDATE) injection 20 mg, 20 mg, Intravenous, Q2H PRN, Renee Pain, MD, 20 mg at 01/04/18 0858 .  levETIRAcetam (KEPPRA) tablet 500 mg, 500 mg, Oral, BID, Wynell Balloon, RPH, 500 mg at 01/09/18 0856 .  magnesium hydroxide (MILK OF MAGNESIA) suspension 30 mL, 30 mL, Oral,  Daily  PRN, Rush Farmer, MD, 30 mL at 01/05/18 1802 .  MEDLINE mouth rinse, 15 mL, Mouth Rinse, BID, Rush Farmer, MD, 15 mL at 01/09/18 0849 .  pantoprazole (PROTONIX) EC tablet 40 mg, 40 mg, Oral, QHS, Collene Gobble, MD, 40 mg at 01/08/18 2114 .  polyethylene glycol (MIRALAX / GLYCOLAX) packet 17 g, 17 g, Oral, Daily, Ollis, Brandi L, NP, 17 g at 01/09/18 0851 .  polyethylene glycol (MIRALAX / GLYCOLAX) packet 34 g, 34 g, Oral, Once, Elgergawy, Dawood S, MD .  protein supplement (ENSURE MAX) liquid, 11 oz, Oral, Daily, Elgergawy, Silver Huguenin, MD, 11 oz at 01/09/18 0851 .  Rivaroxaban (XARELTO) tablet 15 mg, 15 mg, Oral, Q supper, Karren Cobble, RPH, 15 mg at 01/08/18 1748 .  senna (SENOKOT) tablet 8.6 mg, 1 tablet, Oral, Daily, Rush Farmer, MD, 8.6 mg at 01/09/18 0849 .  senna-docusate (Senokot-S) tablet 2 tablet, 2 tablet, Oral, BID, Elgergawy, Silver Huguenin, MD  Patients Current Diet:     Diet Order                  Diet Carb Modified Fluid consistency: Thin; Room service appropriate? 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 cane  Prior Functional Level Prior Function Level of Independence: Independent with assistive device(s) Comments: Per chart/RN pt used a cane PTA and drove.  Self Care: Did the patient need help bathing, dressing, using the toilet or eating?  Independent  Indoor Mobility: Did the patient need  assistance with walking from room to room (with or without device)? Independent  Stairs: Did the patient need assistance with internal or external stairs (with or without device)? Independent  Functional Cognition: Did the patient need help planning regular tasks such as shopping or remembering to take medications? Independent  Current Functional Level Cognition  Overall Cognitive Status: Impaired/Different from baseline Current Attention Level: Sustained Orientation Level: Oriented X4 Following Commands: Follows one step commands inconsistently, Follows one step commands with increased time Safety/Judgement: Decreased awareness of safety, Decreased awareness of deficits General Comments: HOH, needs repitition and multimodal cues for safety during ADL mobility    Extremity Assessment (includes Sensation/Coordination)  Upper Extremity Assessment: Overall WFL for tasks assessed  Lower Extremity Assessment: Generalized weakness(decreased coordination at the legs)    ADLs  Overall ADL's : Needs assistance/impaired Eating/Feeding: Set up, Sitting Grooming: Wash/dry face, Wash/dry hands, Standing, Min guard Lower Body Bathing: Moderate assistance, Sit to/from stand Toilet Transfer: Minimal assistance, RW, Comfort height toilet, Ambulation, Grab bars, Cueing for safety Toilet Transfer Details (indicate cue type and reason): pt walking into surfaces with RW on R and L side without awareness Toileting- Clothing Manipulation and Hygiene: Minimal assistance, Sit to/from stand, Cueing for safety Functional mobility during ADLs: Minimal assistance, Rolling walker, Cueing for safety General ADL Comments: pt requires redirection throughout session    Mobility  Overal bed mobility: Needs Assistance Bed Mobility: Supine to Sit Supine to sit: Min assist  General bed mobility comments: Pt required assistance to advance B LEs to edge of bed and to elevate trunk into sitting.       Transfers  Overall transfer level: Needs assistance Equipment used: Rolling walker (2 wheeled) Transfers: Sit to/from Stand Sit to Stand: Min assist General transfer comment: needs cues for hand placement and safety.  Pt attempts to pull on RW for support vs.  pushing from seated surface.      Ambulation / Gait / Stairs / Wheelchair Mobility  Ambulation/Gait Ambulation/Gait assistance: +2 safety/equipment, Min assist Gait Distance (Feet): 60 Feet Assistive device: Rolling walker (2 wheeled) Gait Pattern/deviations: Step-through pattern, Trunk flexed, Decreased stride length, Decreased dorsiflexion - right, Decreased dorsiflexion - left General Gait Details: Pt able to progress ambulation with good tolerance.  She did experience urinary incontinence during gait training.  Constant cues for posture and upper trunk control.   Gait velocity: slow and flexed.      Posture / Balance Dynamic Sitting Balance Sitting balance - Comments: Pt using hands to maintain trunk off of backrest of chair.  When she lets go, she falls posteriorly and to the right.  Balance Overall balance assessment: Needs assistance Sitting-balance support: Feet supported, Bilateral upper extremity supported Sitting balance-Leahy Scale: Poor Sitting balance - Comments: Pt using hands to maintain trunk off of backrest of chair.  When she lets go, she falls posteriorly and to the right.  Postural control: Posterior lean Standing balance support: Bilateral upper extremity supported, During functional activity, Single extremity supported Standing balance-Leahy Scale: Poor Standing balance comment: needs mod external assist in standing.     Special needs/care consideration BiPAP/CPAP: no CPM: no Continuous Drip IV: no Dialysis: no        Days: NA Life Vest: no Oxygen: no, on RA on 01/09/18.  Special Bed: no Trach Size: no Wound Vac (area): no      Location: no Skin: abrasion to chin, ecchymosis to L arm, moisture  associated damage to abdomen and groin   Bowel mgmt:last BM: 01/07/18, continent Bladder mgmt: incontinent  Diabetic mgmt: yes     Previous Home Environment Living Arrangements: Alone Available Help at Discharge: Family, Available 24 hours/day, Other (Comment)(plans to go to daughter's, will arrange 24/7) Type of Home: House(daughter's) Home Layout: One level Home Access: Stairs to enter [employer] of Steps: 3 Home Care Services: No Additional Comments: information gathered from chart review. question if patient is reliable   Discharge Living Setting Plans for Discharge Living Setting: Other (Comment)(going to daughters home) Type of Home at Discharge: House Discharge Home Layout: One level Discharge Home Access: [place] entrance Discharge Bathroom Shower/Tub: Tub/shower unit Discharge Bathroom Toilet: Standard Discharge Bathroom Accessibility: Yes How Accessible: Accessible via walker, Other (comment)(possibly if sidestepped with RW) Does the patient have any problems obtaining your medications?: No  Social/Family/Support Systems Patient Roles: Other (Comment)(parent to grown children) Contact Information: daughter: [name] [phone]) works [place] as Therapist, sports? Anticipated Caregiver: daughter, grandson, and friends  Anticipated Caregiver's Contact Information: see above Ability/Limitations of Caregiver: Supervision Caregiver Availability: Intermittent(has option to hire) Discharge Plan Discussed with Primary Caregiver: Yes Is Caregiver In Agreement with Plan?: Yes Does Caregiver/Family have Issues with Lodging/Transportation while Pt is in Rehab?: No   Goals/Additional Needs Patient/Family Goal for Rehab: PT/OT/SLP: Mod I/Supervision Expected length of stay: 10-14 days Cultural Considerations: "Loves the [employer] Dietary Needs: carb modified, thin liquids; calorie level 1600-2000 Equipment Needs: TBD Pt/Family Agrees to Admission and willing to participate:  Yes Program Orientation Provided & Reviewed  with Pt/Caregiver Including Roles  & Responsibilities: Yes(daughter and pt)  Barriers to Discharge: Incontinence, Lack of/limited family support  Barriers to Discharge Comments: family to address support at DC   Decrease burden of Care through IP rehab admission: NA   Possible need for SNF placement upon discharge: Not anticipated; pt has good social support at DC and has good prognosis for further progress through CIR.    Patient Condition: This patient's medical and functional status has changed since the consult dated: 01/06/18 in which the Rehabilitation Physician determined and documented that the patient's condition is appropriate for intensive rehabilitative care in an inpatient rehabilitation facility. See "History of Present Illness" (above) for medical update. Functional changes are: progression in ambulation from Mod A x 2 to Min A x 2 60 feet and progression from Min/Mod A ADLs to Min A. Patient's medical and functional status update has been discussed with the Rehabilitation physician and patient remains appropriate for inpatient rehabilitation. Will admit to inpatient rehab today.  Preadmission Screen Completed By:  Jhonnie Garner, 01/09/2018 12:47 PM ______________________________________________________________________   Discussed status with Dr. Posey Pronto on 01/09/18 at 12:46PM and received telephone approval for admission today.  Admission Coordinator:  Jhonnie Garner, time 12:46PM Sudie Grumbling 01/09/18           Cosigned by: Jamse Arn, MD at 01/09/2018 12:54 PM  Revision History

## 2018-01-09 NOTE — Progress Notes (Signed)
Physical Therapy Evaluation Patient Details Name: [name] MRN: [id number] DOB: July 01, 1932 Today's Date: 01/09/2018   History of Present Illness  84 y.o. female admitted on 01/01/18 for status epilepticus requiring intubation (intubated in ED extubated 01/05/18).  Head CT was negative for acute event.  Neurology following for seizure managment. Hyperglycemia thought to play a role in her current presentation.  She continues to be encephlopathic.  Pt with significant PMH of stroke, obesity, R knee pain, HTN, DM, anemia (iron deficient), and A-fib.  Clinical Impression  Patient remains a good candidate for CIR. She was more lethargic today but still alert and followed all commands. She walked less distance today but was bale to perform all exercises asked of her.     Follow Up Recommendations CIR    Equipment Recommendations       Recommendations for Other Services Rehab consult     Precautions / Restrictions Precautions Precautions: Fall Precaution Comments: seizure Restrictions Weight Bearing Restrictions: No      Mobility  Bed Mobility Overal bed mobility: Needs Assistance Bed Mobility: Supine to Sit     Supine to sit: Min assist     General bed mobility comments: Patient was slow and required min verbal cues but was able to work her way to the edge of the bed with min a  Transfers Overall transfer level: Needs assistance Equipment used: Rolling walker (2 wheeled) Transfers: Sit to/from Stand Sit to Stand: Min assist         General transfer comment: min a min a for strength and initial balance   Ambulation/Gait Ambulation/Gait assistance: +2 safety/equipment;Min assist Gait Distance (Feet): 15 Feet Assistive device: Rolling walker (2 wheeled) Gait Pattern/deviations: Step-through pattern;Trunk flexed;Decreased stride length;Decreased dorsiflexion - right;Decreased dorsiflexion - left Gait velocity: slow and flexed.     General Gait Details: decreased  gait distance but patient a little more lethargic today. She got to the chair and became more alert.   Stairs            Wheelchair Mobility    Modified Rankin (Stroke Patients Only)       Balance Overall balance assessment: Needs assistance Sitting-balance support: Feet supported;Bilateral upper extremity supported Sitting balance-Leahy Scale: Poor Sitting balance - Comments: Pt using hands to maintain trunk off of backrest of chair.  When she lets go, she falls posteriorly and to the right.  Postural control: Posterior lean Standing balance support: Bilateral upper extremity supported;During functional activity;Single extremity supported Standing balance-Leahy Scale: Poor Standing balance comment: needs mod external assist in standing.                              Pertinent Vitals/Pain Pain Assessment: Faces Faces Pain Scale: No hurt    Home Living                        Prior Function                 Hand Dominance        Extremity/Trunk Assessment                Communication      Cognition Arousal/Alertness: Awake/alert Behavior During Therapy: Impulsive Overall Cognitive Status: Impaired/Different from baseline Area of Impairment: Safety/judgement;Problem solving;Awareness;Following commands;Memory;Attention                 Orientation Level: Disoriented to;Time;Situation Current Attention Level: Sustained Memory: Decreased short-term  memory Following Commands: Follows one step commands inconsistently;Follows one step commands with increased time     Problem Solving: Slow processing;Difficulty sequencing;Requires verbal cues;Requires tactile cues General Comments: HOH, needs repitition and multimodal cues for safety during ADL mobility; was more lethargic today but still alert and followed commands       General Comments      Exercises General Exercises - Lower Extremity Ankle Circles/Pumps: 10 reps Long  Arc Quad: 10 reps;Both Hip ABduction/ADduction: 10 reps;Both Hip Flexion/Marching: 10 reps;Both   Assessment/Plan    PT Assessment    PT Problem List         PT Treatment Interventions      PT Goals (Current goals can be found in the Care Plan section)  Acute Rehab PT Goals Patient Stated Goal: To get to the bathroom.   PT Goal Formulation: With patient Time For Goal Achievement: 01/19/18 Potential to Achieve Goals: Good    Frequency Min 3X/week   Barriers to discharge        Co-evaluation               AM-PAC PT "6 Clicks" Mobility  Outcome Measure Help needed turning from your back to your side while in a flat bed without using bedrails?: A Little Help needed moving from lying on your back to sitting on the side of a flat bed without using bedrails?: A Lot Help needed moving to and from a bed to a chair (including a wheelchair)?: A Lot Help needed standing up from a chair using your arms (e.g., wheelchair or bedside chair)?: A Lot Help needed to walk in hospital room?: A Lot Help needed climbing 3-5 steps with a railing? : A Lot 6 Click Score: 13    End of Session Equipment Utilized During Treatment: Gait belt;Oxygen Activity Tolerance: Patient tolerated treatment well Patient left: in chair;with call bell/phone within reach;with chair alarm set;with family/visitor present Nurse Communication: Mobility status PT Visit Diagnosis: Muscle weakness (generalized) (M62.81);Difficulty in walking, not elsewhere classified (R26.2);Other symptoms and signs involving the nervous system (R29.898)    Time: 1130-1155 PT Time Calculation (min) (ACUTE ONLY): 25 min   Charges:     PT Treatments $Gait Training: 8-22 mins $Therapeutic Exercise: 8-22 mins          Carney Living PT DPT  01/09/2018, 1:44 PM

## 2018-01-09 NOTE — Progress Notes (Signed)
Inpatient Rehabilitation-Admissions Coordinator   [facility] has received insurance authorization and medical clearance to admit pt to CIR today. AC has notified RN, SW, pt, and her family. Pt and her family consenting to rehab today.   Please call if questions.   Jhonnie Garner, OTR/L  Rehab Admissions Coordinator  [phone] 01/09/2018 12:27 PM

## 2018-01-09 NOTE — Progress Notes (Signed)
Physical Medicine and Rehabilitation Consult Reason for Consult:  Decreased functional mobility Referring Physician: Dr. Lamonte Sakai   HPI: [name] is a 84 y.o.right handed female  With history of diabetes mellitus, hypertension, CVA, atrial fibrillation maintained on xarelto as well as history of small bowel obstruction. Per chart review patient lives alone. Independent with assistive device prior to admission one level home. Plans to stay with her daughter and assistance as needed on discharge. Presented 01/01/2018 with slurred speech as well as question seizure.CT/MRI showed no acute intracranial process. 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  Noted blood sugar 596 and blood pressure in the 200s. EEG with no seizures. Findings consistent with encephalopathy. Patient was loaded with Keppra for seizure prophylaxis. She was extubated 01/05/2018 with therapy evaluation completed 01/05/2018 and recommendations for physical medicine rehabilitation consult.   Review of Systems  Constitutional: Negative for chills and fever.  HENT: Negative for hearing loss.   Eyes: Negative for blurred vision and double vision.  Respiratory: Negative for cough and shortness of breath.   Cardiovascular: Positive for palpitations and leg swelling.  Gastrointestinal: Positive for constipation. Negative for nausea and vomiting.       GERD  Genitourinary: Positive for dysuria. Negative for flank pain and hematuria.  Psychiatric/Behavioral: The patient has insomnia.   All other systems reviewed and are negative.      Past Medical History:  Diagnosis Date  . A-fib [facility]) 2019   Per patients daughter, [name] [phone]  . ANEMIA, IRON DEFICIENCY 08/11/2008  . ANGIOEDEMA 03/01/2008  . CEREBROVASCULAR ACCIDENT, HX OF 03/01/2008  . CONTACT DERMATITIS&OTHER ECZEMA DUE UNSPEC CAUSE 11/28/2008  . DIABETES MELLITUS 03/01/2008  . Dysuria 01/04/2010  . GERD 03/01/2008  . HYPERSOMNIA 05/30/2009  . HYPERTENSION  03/01/2008  . INTERNAL HEMORRHOIDS 03/01/2008  . KNEE PAIN, RIGHT 09/05/2009  . NONSPECIFIC ABN FINDNG RAD&OTH EXAM BILARY TRCT 03/07/2008  . OBESITY 01/10/2010  . PERIPHERAL EDEMA 08/10/2008  . POLYP, GALLBLADDER 03/07/2008  . SMALL BOWEL OBSTRUCTION, HX OF 03/01/2008  . SNORING 05/11/2009  . Stroke ([place]) 04/08/2016   TIA  . THROMBOCYTOSIS 08/18/2008        Past Surgical History:  Procedure Laterality Date  . HERNIA REPAIR  09/14/08   with release [facility]-Dr. Dalbert Batman  . SMALL INTESTINE SURGERY    . 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 Diabetes Mother   . Heart disease Mother   . Diabetes Sister   . Heart disease Sister   . Colitis Sister   . Kidney disease Brother   . Breast cancer Other        Neice  . Allergies Other        Nephrew  . Asthma Other        Nephrew   Social History:  reports that she has never smoked. She has never used smokeless tobacco. She reports that she does not drink alcohol or use drugs. Allergies:       Allergies  Allergen Reactions  . Ace Inhibitors Swelling    Angioedema   . Prednisone Nausea And Vomiting, Other (See Comments) and Hypertension    Blood sugar increased (also)  . Sulfa Antibiotics Hives  . Penicillins Rash    Has patient had a PCN reaction causing immediate rash, facial/tongue/throat swelling, SOB or lightheadedness with hypotension: Yes Has patient had a PCN reaction causing severe rash  involving mucus membranes or skin necrosis: No Has patient had a PCN reaction that required hospitalization: No Has patient had a PCN reaction occurring within the last 10 years: No If all of the above answers are "NO", then may proceed with Cephalosporin use.   . Sulfamethoxazole Rash  . Sulfonamide Derivatives Rash         Medications Prior to Admission  Medication Sig Dispense Refill  . Cholecalciferol (VITAMIN D3  PO) Take 1 tablet by mouth daily with lunch.    . cloNIDine (CATAPRES) 0.2 MG tablet Take 1 tablet (0.2 mg total) by mouth 2 (two) times daily. (Patient taking differently: Take 0.2 mg by mouth 3 (three) times daily. ) 60 tablet 3  . diltiazem (CARDIZEM CD) 180 MG 24 hr capsule Take 1 capsule (180 mg total) by mouth daily. 30 capsule 3  . feeding supplement, GLUCERNA SHAKE, (GLUCERNA SHAKE) LIQD Take 237 mLs by mouth See admin instructions. Drink one can (237 mls) by mouth once or twice daily as meal replacement    . Insulin Detemir (LEVEMIR FLEXTOUCH) 100 UNIT/ML Pen Inject 18 Units into the skin 2 (two) times daily. Inject 20 units subcutaneously twice daily - after lunch and supper (Patient taking differently: Inject 15-23 Units into the skin See admin instructions. Use 25 units every morning and use 18 units every evening) 15 mL 11  . IRON PO Take 325 mg by mouth daily with lunch.     . losartan (COZAAR) 100 MG tablet Take 100 mg by mouth daily.    . Multiple Vitamin (MULTIVITAMIN WITH MINERALS) TABS tablet Take 1 tablet by mouth daily.    Marland Kitchen omeprazole (PRILOSEC OTC) 20 MG tablet Take 20 mg by mouth daily with breakfast.     . polycarbophil (FIBERCON) 625 MG tablet Take 625 mg by mouth daily as needed for mild constipation.    . Rivaroxaban (XARELTO) 15 MG TABS tablet Take 1 tablet (15 mg total) by mouth daily with supper. 42 tablet 3  . spironolactone (ALDACTONE) 25 MG tablet Take 12.5 mg by mouth daily.   3  . vitamin B-12 (CYANOCOBALAMIN) 500 MCG tablet Take 500 mcg by mouth daily with lunch.    . metFORMIN (GLUCOPHAGE) 500 MG tablet Take 500 mg by mouth daily.  3    Home: Home Living Family/patient expects to be discharged to:: Private residence Living Arrangements: Alone Available Help at Discharge: Family, Available 24 hours/day, Other (Comment)(plans to go to daughter's, will arrange 24/7) Type of Home: House(daughter's) Home Access: Stairs to enter Entrance  Stairs-Number of Steps: 3 Home Layout: One level Home Equipment: Wilson - single point Additional Comments: information gathered from chart review. question if patient is reliable   Functional History: Prior Function Level of Independence: Independent with assistive device(s) Comments: Per chart/RN pt used a cane PTA and drove. Functional Status:  Mobility: Bed Mobility General bed mobility comments: in chair on arrival Transfers Overall transfer level: Needs assistance Equipment used: Rolling walker (2 wheeled) Transfers: Sit to/from Stand Sit to Stand: Min assist General transfer comment: needs cues for hand placement and safety Ambulation/Gait Ambulation/Gait assistance: +2 safety/equipment, Mod assist Gait Distance (Feet): 5 Feet(x2) Assistive device: Rolling walker (2 wheeled) Gait Pattern/deviations: Step-through pattern, Ataxic General Gait Details: Pt with uncoordinated gait pattern, wide BOS, hand slipping off of RW on the left.  Two person assist for chair to follow for short distance gait in room.    ADL: ADL Overall ADL's : Needs assistance/impaired Eating/Feeding: Set up, Sitting Grooming:  Wash/dry face, Set up, Sitting Lower Body Bathing: Moderate assistance, Sit to/from stand Toilet Transfer: Minimal assistance, RW Toilet Transfer Details (indicate cue type and reason): pt walking into surfaces with RW on R and L side without awareness Toileting- Clothing Manipulation and Hygiene: Minimal assistance Functional mobility during ADLs: Minimal assistance, Rolling walker General ADL Comments: pt requires redirection throughout session  Cognition: Cognition Overall Cognitive Status: Impaired/Different from baseline Orientation Level: Oriented to person, Oriented to place Cognition Arousal/Alertness: Awake/alert Behavior During Therapy: Impulsive Overall Cognitive Status: Impaired/Different from baseline Area of Impairment: Orientation, Memory, Attention,  Following commands, Safety/judgement, Awareness, Problem solving Orientation Level: Disoriented to, Time, Situation Current Attention Level: Sustained Memory: Decreased short-term memory Following Commands: Follows one step commands inconsistently, Follows one step commands with increased time Safety/Judgement: Decreased awareness of safety, Decreased awareness of deficits Awareness: Emergent Problem Solving: Slow processing, Difficulty sequencing, Requires verbal cues, Requires tactile cues General Comments: Pt's cognition is complicated by being very HOH.  She is slow to process, often needs repetition. pt reading bible but unable to state any information about the palsm verse that she is reading. pt reports that she went to the grocery store this morning. grandson present and correcting patient  Blood pressure (!) 135/57, pulse 71, temperature 98.1 F (36.7 C), temperature source Oral, resp. rate 16, height 5\' 1"  (1.549 m), weight 89.5 kg, SpO2 99 %. Physical Exam  Neurological:  Patient is alert sitting up in bed. She provides her age as well as her upcoming holiday Christmas. Follow simple commands    LabResultsLast24Hours       Results for orders placed or performed during the hospital encounter of 01/01/18 (from the past 24 hour(s))  Glucose, capillary     Status: Abnormal   Collection Time: 01/05/18  2:07 PM  Result Value Ref Range   Glucose-Capillary 257 (H) 70 - 99 mg/dL  Glucose, capillary     Status: Abnormal   Collection Time: 01/05/18  2:58 PM  Result Value Ref Range   Glucose-Capillary 262 (H) 70 - 99 mg/dL  Glucose, capillary     Status: Abnormal   Collection Time: 01/05/18  8:33 PM  Result Value Ref Range   Glucose-Capillary 172 (H) 70 - 99 mg/dL  Glucose, capillary     Status: Abnormal   Collection Time: 01/06/18 12:19 AM  Result Value Ref Range   Glucose-Capillary 179 (H) 70 - 99 mg/dL  CBC     Status: Abnormal   Collection Time: 01/06/18  2:59  AM  Result Value Ref Range   WBC 17.1 (H) 4.0 - 10.5 K/uL   RBC 3.71 (L) 3.87 - 5.11 MIL/uL   Hemoglobin 9.7 (L) 12.0 - 15.0 g/dL   HCT 30.6 (L) 36.0 - 46.0 %   MCV 82.5 80.0 - 100.0 fL   MCH 26.1 26.0 - 34.0 pg   MCHC 31.7 30.0 - 36.0 g/dL   RDW 16.0 (H) 11.5 - 15.5 %   Platelets 401 (H) 150 - 400 K/uL   nRBC 0.0 0.0 - 0.2 %  Basic metabolic panel     Status: Abnormal   Collection Time: 01/06/18  2:59 AM  Result Value Ref Range   Sodium 139 135 - 145 mmol/L   Potassium 5.0 3.5 - 5.1 mmol/L   Chloride 107 98 - 111 mmol/L   CO2 22 22 - 32 mmol/L   Glucose, Bld 160 (H) 70 - 99 mg/dL   BUN 39 (H) 8 - 23 mg/dL   Creatinine, Ser 1.15 (  H) 0.44 - 1.00 mg/dL   Calcium 9.1 8.9 - 10.3 mg/dL   GFR calc non Af Amer 44 (L) >60 mL/min   GFR calc Af Amer 51 (L) >60 mL/min   Anion gap 10 5 - 15  Magnesium     Status: None   Collection Time: 01/06/18  2:59 AM  Result Value Ref Range   Magnesium 2.0 1.7 - 2.4 mg/dL  Phosphorus     Status: None   Collection Time: 01/06/18  2:59 AM  Result Value Ref Range   Phosphorus 3.1 2.5 - 4.6 mg/dL  Glucose, capillary     Status: Abnormal   Collection Time: 01/06/18  4:02 AM  Result Value Ref Range   Glucose-Capillary 131 (H) 70 - 99 mg/dL  Glucose, capillary     Status: Abnormal   Collection Time: 01/06/18  8:31 AM  Result Value Ref Range   Glucose-Capillary 113 (H) 70 - 99 mg/dL  Glucose, capillary     Status: Abnormal   Collection Time: 01/06/18 11:34 AM  Result Value Ref Range   Glucose-Capillary 311 (H) 70 - 99 mg/dL     ImagingResults(Last48hours)  No results found.     Assessment/Plan: Diagnosis: Functional deficits due to seizure and encephalopathy.   1. Does the need for close, 24 hr/day medical supervision in concert with the patient's rehab needs make it unreasonable for this patient to be served in a less intensive setting? Yes 2. Co-Morbidities requiring supervision/potential complications:  HTN, DM 3. Due to bladder management, bowel management, safety, skin/wound care, disease management, medication administration, pain management and patient education, does the patient require 24 hr/day rehab nursing? Yes 4. Does the patient require coordinated care of a physician, rehab nurse, PT (1-2 hrs/day, 5 days/week), OT (1-2 hrs/day, 5 days/week) and SLP (1-2 hrs/day, 5 days/week) to address physical and functional deficits in the context of the above medical diagnosis(es)? Yes Addressing deficits in the following areas: balance, endurance, locomotion, strength, transferring, bowel/bladder control, bathing, dressing, feeding, grooming, toileting, cognition and psychosocial support 5. Can the patient actively participate in an intensive therapy program of at least 3 hrs of therapy per day at least 5 days per week? Yes 6. The potential for patient to make measurable gains while on inpatient rehab is good 7. Anticipated functional outcomes upon discharge from inpatient rehab are modified independent and supervision  with PT, modified independent and supervision with OT, modified independent and supervision with SLP. 8. Estimated rehab length of stay to reach the above functional goals is: 10-14 days 9. Anticipated D/C setting: Home 10. Anticipated post D/C treatments: [facility] therapy 11. Overall Rehab/Functional Prognosis: excellent  RECOMMENDATIONS: This patient's condition is appropriate for continued rehabilitative care in the following setting: CIR Patient has agreed to participate in recommended program. Yes Note that insurance prior authorization may be required for reimbursement for recommended care.  Comment: Rehab Admissions Coordinator to follow up.  Thanks,  Meredith Staggers, MD, Mellody Drown  I have personally performed a face to face diagnostic evaluation of this patient. Additionally, I have reviewed and concur with the physician assistant's documentation above.    Meredith Staggers, MD 01/06/2018        Revision History                        Routing History

## 2018-01-09 NOTE — Discharge Summary (Addendum)
[name], is a 84 y.o. female  DOB 1932/02/18  MRN [id number].  Admission date:  01/01/2018  Admitting Physician  Renee Pain, MD  Discharge Date:  01/09/2018   Primary MD  Charolette Forward, MD  Recommendations for primary care physician for things to follow:  -Please encouraged use incentive spirometry and flutter valve -monitor CBG closely and adjust insulin dose as needed -Resume losartan if blood pressure started to increase, and renal function remained stable  Admission Diagnosis  Status epilepticus ([place]) [G40.901] Visit for intubation [Z01.818] Diabetic ketoacidosis without coma associated with type 1 diabetes mellitus ([place]) [E10.10]   Discharge Diagnosis  Status epilepticus ([place]) [G40.901] Visit for intubation [Z01.818] Diabetic ketoacidosis without coma associated with type 1 diabetes mellitus ([place]) [E10.10]    Principal Problem:   Status epilepticus ([place]) Active Problems:   Accelerated hypertension   Acute kidney injury ([place])   Type 2 diabetes mellitus ([place])   Respiratory failure ([place])      Past Medical History:  Diagnosis Date  . A-fib [facility]) 2019   Per patients daughter, [name] [phone]  . ANEMIA, IRON DEFICIENCY 08/11/2008  . ANGIOEDEMA 03/01/2008  . CEREBROVASCULAR ACCIDENT, HX OF 03/01/2008  . CONTACT DERMATITIS&OTHER ECZEMA DUE UNSPEC CAUSE 11/28/2008  . DIABETES MELLITUS 03/01/2008  . Dysuria 01/04/2010  . GERD 03/01/2008  . HYPERSOMNIA 05/30/2009  . HYPERTENSION 03/01/2008  . INTERNAL HEMORRHOIDS 03/01/2008  . KNEE PAIN, RIGHT 09/05/2009  . NONSPECIFIC ABN FINDNG RAD&OTH EXAM BILARY TRCT 03/07/2008  . OBESITY 01/10/2010  . PERIPHERAL EDEMA 08/10/2008  . POLYP, GALLBLADDER 03/07/2008  . SMALL BOWEL OBSTRUCTION, HX OF 03/01/2008  . SNORING 05/11/2009  . Stroke ([place]) 04/08/2016   TIA  . THROMBOCYTOSIS 08/18/2008    Past Surgical History:  Procedure Laterality Date  .  HERNIA REPAIR  09/14/08   with release [facility]-Dr. Dalbert Batman  . SMALL INTESTINE SURGERY    . 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  Symptoms started 12/11 when she developed slurred speech [place] the bank.  She had several episodes of this causing her to present to the emergency department in the evening hours of 12/11.  Work-up including CT scan and MRI were negative and she was ultimately discharged home.  Then in the early morning hours of 12/12 she was found to be unresponsive by her grandson.  EMS was called and in route to the emergency department she experienced seizure-like activity.  She had another witnessed seizure in the emergency department and was intubated for airway protection.  Repeat CT  of the head was nonacute.  Neurology was consulted in the emergency department and has made recommendations for antiepileptic medications and started propofol infusion.    Hospital Course  84 year old female with past medical history of diabetes and hypertension admitted 12/12 for status epilepticus requiring intubation.  Initially felt secondary to prescription uncontrolled blood pressure, but there is no evidence of that on MRI, felt to be secondary to hyperglycemia, patient was successfully extubated, transferred to [facility] service 01/07/2018  12/11 to ED for slurred speech/TIA. Discharged 12/12 to ED found down. Seizures. Intubated  12/14 Cervical spine cleared, no further seizure activity. PSV  12/15 No cuff leak on  exam   Acute respiratory failure with hypoxia -Required intubation on admission as well as not able to protect her airways, required intubation, was kept in ICU, successfully extubated, respiratory failure has resolved, currently on room air.  Status epilepticus -No seizure history.  Required Intubation on presentation for airway protection, felt by neurology most likely due to hyperglycemia, no evidence of present on repeat MRI 01/02/2018 . -Continue with Keppra 500 mg oral twice daily   History of CVA and TIA - on Xarelto, MRI this admission with no evidence of acute CVA  Paroxysmal A. Fib -Heart rate controlled, continue with Cardizem, will continue with Xarelto for anticoagulation  Diabetes mellitus -CBG is labile, but overall uncontrolled, she will be discharged on Lantus 18 units subcu daily, and metformin and insulin sliding scale  Hypertension  -Blood pressure acceptable, continue with Cardizem CD, clonidine, other medication has been stopped -Resume losartan functions remained stable  CKD stage II -Elevated at 1.88 on admission, baseline around 1.1, resolved   Discharge Condition:  Stable      Discharge Instructions  and  Discharge Medications    Discharge Instructions    Discharge instructions   Complete by:  As directed    Continue with carb modified diet   Increase activity slowly   Complete by:  As directed      Allergies as of 01/09/2018      Reactions   Ace Inhibitors Swelling   Angioedema   Prednisone Nausea And Vomiting, Other (See Comments), Hypertension   Blood sugar increased (also)   Sulfa Antibiotics Hives   Penicillins Rash   Has patient had a PCN reaction causing immediate rash, facial/tongue/throat swelling, SOB or lightheadedness with hypotension: Yes Has patient had a PCN reaction causing severe rash involving mucus membranes or skin necrosis: No Has patient had a PCN reaction that required hospitalization: No Has patient had  a PCN reaction occurring within the last 10 years: No If all of the above answers are "NO", then may proceed with Cephalosporin use.   Sulfamethoxazole Rash   Sulfonamide Derivatives Rash      Medication List    STOP taking these medications   losartan 100 MG tablet Commonly known as:  COZAAR   omeprazole 20 MG tablet Commonly known as:  PRILOSEC OTC   spironolactone 25 MG tablet Commonly known as:  ALDACTONE     TAKE these medications   acetaminophen 325 MG tablet Commonly known as:  TYLENOL Take 2 tablets (650 mg total) by mouth every 6 (six) hours as needed for mild pain or fever (temp > 100.3).   cloNIDine 0.2 MG tablet Commonly known as:  CATAPRES Take 1 tablet (0.2 mg total) by mouth 2 (two) times daily. What changed:  when to take this   diltiazem 180 MG 24 hr capsule Commonly known as:  CARTIA XT Take  1 capsule (180 mg total) by mouth daily.   feeding supplement (GLUCERNA SHAKE) Liqd Take 237 mLs by mouth See admin instructions. Drink one can (237 mls) by mouth once or twice daily as meal replacement   insulin aspart 100 UNIT/ML injection Commonly known as:  novoLOG Inject 0-5 Units into the skin at bedtime.   insulin aspart 100 UNIT/ML injection Commonly known as:  novoLOG Inject 0-9 Units into the skin 3 (three) times daily with meals.   Insulin Detemir 100 UNIT/ML Pen Commonly known as:  LEVEMIR FLEXTOUCH Inject 18 Units into the skin 2 (two) times daily. Inject 20 units subcutaneously twice daily - after lunch and supper What changed:    how much to take  when to take this  additional instructions   ipratropium-albuterol 0.5-2.5 (3) MG/3ML Soln Commonly known as:  DUONEB Take 3 mLs by nebulization every 6 (six) hours as needed.   IRON PO Take 325 mg by mouth daily with lunch.   levETIRAcetam 500 MG tablet Commonly known as:  KEPPRA Take 1 tablet (500 mg total) by mouth 2 (two) times daily.   metFORMIN 500 MG tablet Commonly known as:   GLUCOPHAGE Take 500 mg by mouth daily.   multivitamin with minerals Tabs tablet Take 1 tablet by mouth daily.   pantoprazole 40 MG tablet Commonly known as:  PROTONIX Take 1 tablet (40 mg total) by mouth at bedtime.   polycarbophil 625 MG tablet Commonly known as:  FIBERCON Take 625 mg by mouth daily as needed for mild constipation.   polyethylene glycol packet Commonly known as:  MIRALAX / GLYCOLAX Take 17 g by mouth daily. Start taking on:  January 10, 2018   Rivaroxaban 15 MG Tabs tablet Commonly known as:  XARELTO Take 1 tablet (15 mg total) by mouth daily with supper.   senna-docusate 8.6-50 MG tablet Commonly known as:  Senokot-S Take 2 tablets by mouth 2 (two) times daily.   vitamin B-12 500 MCG tablet Commonly known as:  CYANOCOBALAMIN Take 500 mcg by mouth daily with lunch.   VITAMIN D3 PO Take 1 tablet by mouth daily with lunch.         Diet and Activity recommendation: See Discharge Instructions above   Consults obtained -  PCCM Neurology Inpatient rehab   Major procedures and Radiology Reports - PLEASE review detailed and final reports for all details, in brief -    Dg Abd 1 View  Result Date: 01/01/2018 CLINICAL DATA:  NG placement EXAM: ABDOMEN - 1 VIEW COMPARISON:  CT abdomen 07/25/2007 FINDINGS: NG tip in the gastric antrum. Distended large and small bowel loops. Moderate stool in the right colon. Mild stool in the rectosigmoid. IMPRESSION: NG tube in the gastric antrum Adynamic ileus with retained stool in the colon. Moderate stool in the right colon. Electronically Signed   By: Franchot Gallo M.D.   On: 01/01/2018 16:55   Ct Head Wo Contrast  Result Date: 01/01/2018 CLINICAL DATA:  84 year old female with head trauma. EXAM: CT HEAD WITHOUT CONTRAST CT CERVICAL SPINE WITHOUT CONTRAST TECHNIQUE: Multidetector CT imaging of the head and cervical spine was performed following the standard protocol without intravenous contrast. Multiplanar CT  image reconstructions of the cervical spine were also generated. COMPARISON:  Head CT dated 12/31/2017 an MRI dated 12/31/2017 FINDINGS: CT HEAD FINDINGS Brain: There is moderate age-related atrophy and chronic microvascular ischemic changes. There is no acute intracranial hemorrhage. No mass effect or midline shift. No extra-axial fluid collection. Vascular: No hyperdense vessel  or unexpected calcification. Skull: Normal. Negative for fracture or focal lesion. Sinuses/Orbits: Partial opacification of the right sphenoid sinus. The remainder of the visualized paranasal sinuses and mastoid air cells are clear. Other: An enteric tube is partially visualized. CT CERVICAL SPINE FINDINGS Alignment: No acute subluxation. Skull base and vertebrae: No acute fracture. Osteopenia. Soft tissues and spinal canal: No prevertebral fluid or swelling. No visible canal hematoma. Disc levels:  Degenerative changes primarily at C4-C6. Upper chest: Atherosclerotic calcification of the aortic arch. Heterogeneous and mildly enlarged thyroid gland with multiple hypodense nodules. Further evaluation with ultrasound on a nonemergent basis recommended. Other: An endotracheal and an enteric tube are partially visualized. IMPRESSION: 1. No acute intracranial hemorrhage. 2. No acute/traumatic cervical spine pathology. Electronically Signed   By: Anner Crete M.D.   On: 01/01/2018 06:28   Ct Head Wo Contrast  Result Date: 12/31/2017 CLINICAL DATA:  Slurred speech EXAM: CT HEAD WITHOUT CONTRAST TECHNIQUE: Contiguous axial images were obtained from the base of the skull through the vertex without intravenous contrast. COMPARISON:  CT brain 09/16/2016, MRI 04/09/2016 FINDINGS: Brain: No acute territorial infarction, hemorrhage, or intracranial mass is visualized. Atrophy with moderate to marked small vessel ischemic changes of the white matter. Chronic lacunar infarcts within the basal ganglia and cerebellum. Stable ventricle size.  Vascular: No hyperdense vessels.  Carotid vascular calcification. Skull: Normal. Negative for fracture or focal lesion. Sinuses/Orbits: No acute finding. Other: None IMPRESSION: 1. No CT evidence for acute intracranial abnormality. 2. Atrophy and small vessel ischemic changes of the white matter. Electronically Signed   By: Donavan Foil M.D.   On: 12/31/2017 18:35   Ct Cervical Spine Wo Contrast  Result Date: 01/01/2018 CLINICAL DATA:  84 year old female with head trauma. EXAM: CT HEAD WITHOUT CONTRAST CT CERVICAL SPINE WITHOUT CONTRAST TECHNIQUE: Multidetector CT imaging of the head and cervical spine was performed following the standard protocol without intravenous contrast. Multiplanar CT image reconstructions of the cervical spine were also generated. COMPARISON:  Head CT dated 12/31/2017 an MRI dated 12/31/2017 FINDINGS: CT HEAD FINDINGS Brain: There is moderate age-related atrophy and chronic microvascular ischemic changes. There is no acute intracranial hemorrhage. No mass effect or midline shift. No extra-axial fluid collection. Vascular: No hyperdense vessel or unexpected calcification. Skull: Normal. Negative for fracture or focal lesion. Sinuses/Orbits: Partial opacification of the right sphenoid sinus. The remainder of the visualized paranasal sinuses and mastoid air cells are clear. Other: An enteric tube is partially visualized. CT CERVICAL SPINE FINDINGS Alignment: No acute subluxation. Skull base and vertebrae: No acute fracture. Osteopenia. Soft tissues and spinal canal: No prevertebral fluid or swelling. No visible canal hematoma. Disc levels:  Degenerative changes primarily at C4-C6. Upper chest: Atherosclerotic calcification of the aortic arch. Heterogeneous and mildly enlarged thyroid gland with multiple hypodense nodules. Further evaluation with ultrasound on a nonemergent basis recommended. Other: An endotracheal and an enteric tube are partially visualized. IMPRESSION: 1. No acute  intracranial hemorrhage. 2. No acute/traumatic cervical spine pathology. Electronically Signed   By: Anner Crete M.D.   On: 01/01/2018 06:28   Mr Brain Wo Contrast  Result Date: 01/03/2018 CLINICAL DATA:  New onset of seizure.  Re-evaluate for PRES. EXAM: MRI HEAD WITHOUT CONTRAST TECHNIQUE: Multiplanar, multiecho pulse sequences of the brain and surrounding structures were obtained without intravenous contrast. COMPARISON:  MR brain 12/31/2017. FINDINGS: Brain: No evidence for acute infarction, hemorrhage, mass lesion, hydrocephalus, or extra-axial fluid. Generalized atrophy. Advanced chronic microvascular ischemic change. Unchanged small parafalcine arachnoid cyst. No imaging findings  conclusive for PRES. Chronic BILATERAL cerebellar infarcts. Coronal imaging demonstrates no asymmetry, mass, or inflammatory process affecting the temporal lobes. Vascular: Flow voids are maintained. Chronic microhemorrhages are stable. Skull and upper cervical spine: Normal marrow signal. Sinuses/Orbits: No acute findings. RIGHT cataract extraction. Chronic sinus disease. Other: None. IMPRESSION: Advanced changes of atrophy and small vessel disease with chronic microhemorrhages and scattered lacunar infarcts. Stable exam from 12/31/2017. No acute intracranial abnormality. No features to suggest interval development of PRES. Electronically Signed   By: Staci Righter M.D.   On: 01/03/2018 12:25   Mr Brain Wo Contrast  Result Date: 12/31/2017 CLINICAL DATA:  Recurrent slurred speech this afternoon. History of hypertension and diabetes. EXAM: MRI HEAD WITHOUT CONTRAST TECHNIQUE: Multiplanar, multiecho pulse sequences of the brain and surrounding structures were obtained without intravenous contrast. COMPARISON:  CT HEAD December 31, 2017 FINDINGS: INTRACRANIAL CONTENTS: No reduced diffusion to suggest acute ischemia. No susceptibility artifact to suggest hemorrhage. Old bilateral cerebellar infarcts. Confluent  supratentorial white matter FLAIR T2 hyperintensities. No midline shift, mass effect or masses. Scattered cerebellar and to lesser extent central cerebrum more chronic microhemorrhages. Prominent basal ganglia and thalami perivascular spaces associated with chronic hypertension. Moderate parenchymal brain volume loss. No hydrocephalus. No abnormal extra-axial fluid collections. Small RIGHT parafalcine arachnoid cyst. VASCULAR: Normal major intracranial vascular flow voids present at skull base. SKULL AND UPPER CERVICAL SPINE: No abnormal sellar expansion. No suspicious calvarial bone marrow signal. Craniocervical junction maintained. SINUSES/ORBITS: The mastoid air-cells and included paranasal sinuses are well-aerated.The included ocular globes and orbital contents are non-suspicious. Status post RIGHT ocular lens implant. OTHER: LEFT nasal skin nodule.  Recommend direct inspection. IMPRESSION: 1. No acute intracranial process. 2. Multiple old cerebellar infarcts. 3. Moderate to severe chronic small vessel ischemic changes. Electronically Signed   By: Elon Alas M.D.   On: 12/31/2017 20:26   Mr Cervical Spine Wo Contrast  Result Date: 01/02/2018 CLINICAL DATA:  Unresponsive patient.  Found face down. EXAM: MRI CERVICAL SPINE WITHOUT CONTRAST TECHNIQUE: Multiplanar, multisequence MR imaging of the cervical spine was performed. No intravenous contrast was administered. COMPARISON:  Brain MRI 12/31/2017 FINDINGS: Alignment: Normal. Vertebrae: No focal marrow lesion. No compression fracture or evidence of discitis osteomyelitis. Cord: Normal caliber and signal. Posterior Fossa, vertebral arteries, paraspinal tissues: Visualized posterior fossa is normal. Vertebral artery flow voids are preserved. No prevertebral effusion. Disc levels: Sagittal imaging includes the atlantoaxial joint to the level of the T2-3 disc space, with axial imaging of the disc spaces from C2-3 to C7-T1. There is no spinal canal  stenosis. Small disc bulges at C3-4, C4-5 and C5-6 with associated uncovertebral hypertrophy. There is severe left neural foraminal stenosis at C4-5 and C5-6. IMPRESSION: 1. No acute abnormality of the cervical spine. Normal appearance of the spinal cord. 2. Severe left neural foraminal stenosis at C4-5 and C5-6. Electronically Signed   By: Ulyses Jarred M.D.   On: 01/02/2018 18:54   Dg Chest Port 1 View  Result Date: 01/08/2018 CLINICAL DATA:  Shortness of breath, cough EXAM: PORTABLE CHEST 1 VIEW COMPARISON:  01/04/2018 FINDINGS: Mildly low lung volumes. No focal airspace disease or effusion. Stable cardiomediastinal silhouette with aortic atherosclerosis. No pneumothorax. Surgical clips in the left upper quadrant. IMPRESSION: No active disease.  Low lung volumes. Electronically Signed   By: Donavan Foil M.D.   On: 01/08/2018 21:01   Dg Chest Port 1 View  Result Date: 01/04/2018 CLINICAL DATA:  Acute respiratory failure EXAM: PORTABLE CHEST 1 VIEW COMPARISON:  January 03, 2018 FINDINGS: The ETT terminates 18 mm above the carina. Recommend withdrawing 1 cm. The enteric tube terminates below today's film. No pneumothorax. The cardiomediastinal silhouette is stable. No other interval changes. IMPRESSION: 1. The ETT terminates 18 mm above the carina. Recommend withdrawing 1 cm. 2. No other interval changes or acute abnormalities are noted. These results will be called to the ordering clinician or representative by the Radiologist Assistant, and communication documented in the PACS or zVision Dashboard. Electronically Signed   By: Dorise Bullion III M.D   On: 01/04/2018 08:07   Dg Chest Port 1 View  Result Date: 01/03/2018 CLINICAL DATA:  Respiratory failure. EXAM: PORTABLE CHEST 1 VIEW COMPARISON:  Radiograph of January 02, 2018. FINDINGS: Endotracheal tube tip is seen right at the carina; withdrawal by 2-3 cm is recommended. Nasogastric tube is unchanged in position. No pneumothorax or significant  pleural effusion is noted. No acute pulmonary disease is noted. Bony thorax is unremarkable. IMPRESSION: Endotracheal tube tip is seen at the carina; withdrawal by 2-3 cm is recommended. No acute abnormality seen in the chest. These results will be called to the ordering clinician or representative by the Radiologist Assistant, and communication documented in the PACS or zVision Dashboard. Electronically Signed   By: Marijo Conception, M.D.   On: 01/03/2018 08:05   Dg Chest Port 1 View  Result Date: 01/02/2018 CLINICAL DATA:  Respiratory failure EXAM: PORTABLE CHEST 1 VIEW COMPARISON:  Yesterday FINDINGS: Endotracheal tube tip is 18 mm above the carina. An orogastric tube reaches the stomach. Cardiomegaly. Indistinct infrahilar opacities. No Kerley lines or pneumothorax. IMPRESSION: 1. Unremarkable hardware positioning. 2. Infrahilar opacities favoring atelectasis. Electronically Signed   By: Monte Fantasia M.D.   On: 01/02/2018 07:44   Dg Chest Port 1 View  Result Date: 01/01/2018 CLINICAL DATA:  84 y/o F; post intubation and enteric tube placement. EXAM: PORTABLE CHEST 1 VIEW COMPARISON:  01/01/2018 chest radiograph. FINDINGS: Stable normal cardiac silhouette given projection and technique. Aortic atherosclerosis with calcification. Enteric tube tip projects 2.1 cm above the carina. Enteric tube tip extends the proximal stomach, the proximal side hole is in the distal esophagus. Linear opacity at left lung base compatible with atelectasis. No pleural effusion or pneumothorax. No acute osseous abnormality identified. IMPRESSION: 1. Enteric tube tip 2.1 cm above carina. Endotracheal tube tip in proximal stomach, approximately 5 cm advancement recommended. 2. Left basilar atelectasis. Electronically Signed   By: Kristine Garbe M.D.   On: 01/01/2018 06:23   Dg Chest Port 1 View  Result Date: 01/01/2018 CLINICAL DATA:  84 year old female with altered mental status. EXAM: PORTABLE CHEST 1 VIEW  COMPARISON:  Chest radiograph dated 07/25/2017 FINDINGS: Shallow inspiration. No focal consolidation, pleural effusion, or pneumothorax. Mild cardiomegaly. Atherosclerotic calcification of the aortic arch. No acute osseous pathology. Left upper quadrant surgical clips noted. There is gaseous distention of the visualized colon. IMPRESSION: No acute cardiopulmonary process. Electronically Signed   By: Anner Crete M.D.   On: 01/01/2018 05:29   Dg Swallowing Func-speech Pathology  Result Date: 01/07/2018 Objective Swallowing Evaluation: Type of Study: MBS-Modified Barium Swallow Study  Patient Details Name: AKSHITA ITALIANO MRN: [id number] Date of Birth: 1932/06/22 Today's Date: 01/07/2018 Time: SLP Start Time (ACUTE ONLY): 1300 -SLP Stop Time (ACUTE ONLY): 1329 SLP Time Calculation (min) (ACUTE ONLY): 29 min Past Medical History: Past Medical History: Diagnosis Date . A-fib [facility]) 2019  Per patients daughter, [name] [phone] . ANEMIA, IRON DEFICIENCY 08/11/2008 . ANGIOEDEMA 03/01/2008 . CEREBROVASCULAR ACCIDENT,  HX OF 03/01/2008 . CONTACT DERMATITIS&OTHER ECZEMA DUE UNSPEC CAUSE 11/28/2008 . DIABETES MELLITUS 03/01/2008 . Dysuria 01/04/2010 . GERD 03/01/2008 . HYPERSOMNIA 05/30/2009 . HYPERTENSION 03/01/2008 . INTERNAL HEMORRHOIDS 03/01/2008 . KNEE PAIN, RIGHT 09/05/2009 . NONSPECIFIC ABN FINDNG RAD&OTH EXAM BILARY TRCT 03/07/2008 . OBESITY 01/10/2010 . PERIPHERAL EDEMA 08/10/2008 . POLYP, GALLBLADDER 03/07/2008 . SMALL BOWEL OBSTRUCTION, HX OF 03/01/2008 . SNORING 05/11/2009 . Stroke ([place]) 04/08/2016  TIA . THROMBOCYTOSIS 08/18/2008 Past Surgical History: Past Surgical History: Procedure Laterality Date . HERNIA REPAIR  09/14/08  with release [facility]-Dr. Dalbert Batman . SMALL INTESTINE SURGERY   . 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 female admitted for status epilepticus requiring intubation 12/12-12/16.  MRI negative for acute changes. Pt with significant PMH of stroke,  obesity, GERD, SBO, HTN, DM, anemia (iron deficient), and A-fib.  Subjective: pt denies any dysphagia Assessment / Plan / Recommendation CHL IP CLINICAL IMPRESSIONS 01/07/2018 Clinical Impression Patient present with pharyngeal dysphagia likely secondary from intubation. Oral phase of swallow was within normal limits. She had delayed swallow at the pyriform sinuses with all thin liquid boluses and at the vallecule with purees and solids. Reduced epiglottic inversion with thins, but near completion with purees and solids. Reduced pharyngeal peristalis and reduced laryngeal elevation resulting in pharyngeal residue in valleculae and pyriform sinuses with all consistencies. Patient had no penetration or aspiration with any consistency, however she did have clearing throughout exam after flouro was cut off. Suspect clearing was due to residue. In esophageal sweep, the esophagus was clear and no reflux noted. Recommend patient continue regular diet with thin liquids, with no straw. Aspiration precautions of sitting up as straight as possible for all meals, taking small sips and bites with a second swallow. Speech therapy to follow to monitor diet tolerance, pharyngeal strengthening exercises as well as patient and family education.  SLP Visit Diagnosis Dysphagia, pharyngeal phase (R13.13) Attention and concentration deficit following -- Frontal lobe and executive function deficit following -- Impact on safety and function Mild aspiration risk   [facility] IP TREATMENT RECOMMENDATION 01/07/2018 Treatment Recommendations Therapy as outlined in treatment plan below   Prognosis 01/07/2018 Prognosis for Safe Diet Advancement Good Barriers to Reach Goals -- Barriers/Prognosis Comment -- [facility] IP DIET RECOMMENDATION 01/07/2018 SLP Diet Recommendations Regular solids;Thin liquid Liquid Administration via Cup;No straw Medication Administration Whole meds with puree Compensations Minimize environmental distractions;Slow rate;Small  sips/bites;Effortful swallow Postural Changes Seated upright at 90 degrees   [facility] IP OTHER RECOMMENDATIONS 01/07/2018 Recommended Consults -- Oral Care Recommendations Oral care BID Other Recommendations --   [facility] IP FOLLOW UP RECOMMENDATIONS 01/06/2018 Follow up Recommendations (No Data)   [facility] IP FREQUENCY AND DURATION 01/07/2018 Speech Therapy Frequency (ACUTE ONLY) min 2x/week Treatment Duration 1 week      [facility] IP ORAL PHASE 01/07/2018 Oral Phase WFL Oral - Pudding Teaspoon -- Oral - Pudding Cup -- Oral - Honey Teaspoon -- Oral - Honey Cup -- Oral - Nectar Teaspoon -- Oral - Nectar Cup -- Oral - Nectar Straw -- Oral - Thin Teaspoon -- Oral - Thin Cup -- Oral - Thin Straw -- Oral - Puree -- Oral - Mech Soft -- Oral - Regular -- Oral - Multi-Consistency -- Oral - Pill -- Oral Phase - Comment --  [facility] IP PHARYNGEAL PHASE 01/07/2018 Pharyngeal Phase Impaired Pharyngeal- Pudding Teaspoon Delayed swallow initiation-vallecula;Reduced epiglottic inversion;Reduced laryngeal elevation;Reduced tongue base retraction;Pharyngeal residue - valleculae;Pharyngeal residue - pyriform;Reduced pharyngeal peristalsis Pharyngeal --  Pharyngeal- Pudding Cup -- Pharyngeal -- Pharyngeal- Honey Teaspoon -- Pharyngeal -- Pharyngeal- Honey Cup -- Pharyngeal -- Pharyngeal- Nectar Teaspoon -- Pharyngeal -- Pharyngeal- Nectar Cup -- Pharyngeal -- Pharyngeal- Nectar Straw -- Pharyngeal -- Pharyngeal- Thin Teaspoon -- Pharyngeal -- Pharyngeal- Thin Cup Delayed swallow initiation-pyriform sinuses;Reduced epiglottic inversion;Reduced anterior laryngeal mobility;Reduced laryngeal elevation;Reduced airway/laryngeal closure;Reduced tongue base retraction;Pharyngeal residue - valleculae;Pharyngeal residue - pyriform;Reduced pharyngeal peristalsis Pharyngeal -- Pharyngeal- Thin Straw Delayed swallow initiation-pyriform sinuses;Reduced epiglottic inversion;Reduced anterior laryngeal mobility;Reduced laryngeal elevation;Reduced airway/laryngeal  closure;Reduced tongue base retraction;Pharyngeal residue - valleculae;Pharyngeal residue - pyriform Pharyngeal -- Pharyngeal- Puree -- Pharyngeal -- Pharyngeal- Mechanical Soft Delayed swallow initiation-vallecula;Reduced pharyngeal peristalsis;Reduced anterior laryngeal mobility;Reduced laryngeal elevation;Reduced airway/laryngeal closure;Reduced tongue base retraction;Pharyngeal residue - valleculae;Pharyngeal residue - pyriform Pharyngeal -- Pharyngeal- Regular -- Pharyngeal -- Pharyngeal- Multi-consistency -- Pharyngeal -- Pharyngeal- Pill -- Pharyngeal -- Pharyngeal Comment --  [facility] IP CERVICAL ESOPHAGEAL PHASE 01/07/2018 Cervical Esophageal Phase WFL Pudding Teaspoon -- Pudding Cup -- Honey Teaspoon -- Honey Cup -- Nectar Teaspoon -- Nectar Cup -- Nectar Straw -- Thin Teaspoon -- Thin Cup -- Thin Straw -- Puree -- Mechanical Soft -- Regular -- Multi-consistency -- Pill -- Cervical Esophageal Comment -- Charlynne Cousins Ward, MA, CCC-SLP 01/07/2018 1:57 PM               Micro Results   Recent Results (from the past 240 hour(s))  Culture, blood (Routine X 2) w Reflex to ID Panel     Status: None   Collection Time: 01/01/18  8:45 AM  Result Value Ref Range Status   Specimen Description BLOOD LEFT ANTECUBITAL  Final   Special Requests   Final    BOTTLES DRAWN AEROBIC AND ANAEROBIC Blood Culture adequate volume   Culture   Final    NO GROWTH 5 DAYS Performed at [facility], [place] [address] [place], [place] [address]    Report Status 01/06/2018 FINAL  Final  Culture, blood (Routine X 2) w Reflex to ID Panel     Status: None   Collection Time: 01/01/18  9:00 AM  Result Value Ref Range Status   Specimen Description BLOOD LEFT FOREARM  Final   Special Requests   Final    BOTTLES DRAWN AEROBIC AND ANAEROBIC Blood Culture adequate volume   Culture   Final    NO GROWTH 5 DAYS Performed at [facility], [place] [address], [place], [place] [address]    Report Status 01/06/2018 FINAL  Final    Culture, respiratory (non-expectorated)     Status: None   Collection Time: 01/01/18  9:42 AM  Result Value Ref Range Status   Specimen Description TRACHEAL ASPIRATE  Final   Special Requests NONE  Final   Gram Stain   Final    RARE WBC PRESENT, PREDOMINANTLY PMN ABUNDANT GRAM POSITIVE COCCI IN PAIRS IN CHAINS FEW GRAM POSITIVE RODS RARE GRAM NEGATIVE RODS    Culture   Final    ABUNDANT Consistent with normal respiratory flora. Performed at [facility], [place] [address] [place], [place] [address]    Report Status 01/03/2018 FINAL  Final  MRSA PCR Screening     Status: None   Collection Time: 01/01/18  9:09 PM  Result Value Ref Range Status   MRSA by PCR NEGATIVE NEGATIVE Final    Comment:        The GeneXpert MRSA Assay (FDA approved for NASAL specimens only), is one component of a comprehensive MRSA colonization surveillance program. It is  not intended to diagnose MRSA infection nor to guide or monitor treatment for MRSA infections. Performed at [facility], [place] [address], [place], [place] [address]        Today   Subjective:   Kaidance Pantoja today has no headache,no chest or abdominal pain,no new weakness tingling or numbness, Objective:   Blood pressure 118/61, pulse 93, temperature 97.9 F (36.6 C), temperature source Oral, resp. rate 20, height 5\' 1"  (1.549 m), weight 85 kg, SpO2 98 %.   Intake/Output Summary (Last 24 hours) at 01/09/2018 1235 Last data filed at 01/09/2018 0518 Gross per 24 hour  Intake 480 ml  Output 1450 ml  Net -970 ml    Exam Awake Alert, Oriented x 3, No new F.N deficits, Normal affect Symmetrical Chest wall movement, Good air movement bilaterally, CTAB RRR,No Gallops,Rubs or new Murmurs, No Parasternal Heave +ve B.Sounds, Abd Soft, Non tender, No organomegaly appriciated, No rebound -guarding or rigidity. No Cyanosis, Clubbing or edema, No new Rash or bruise  Data Review   CBC w Diff:  Lab Results  Component  Value Date   WBC 12.7 (H) 01/07/2018   HGB 10.4 (L) 01/07/2018   HCT 31.9 (L) 01/07/2018   PLT 400 01/07/2018   LYMPHOPCT 22 01/03/2018   BANDSPCT 0 09/17/2017   MONOPCT 13 01/03/2018   EOSPCT 1 01/03/2018   BASOPCT 0 01/03/2018    CMP:  Lab Results  Component Value Date   NA 138 01/07/2018   K 4.4 01/07/2018   CL 106 01/07/2018   CO2 25 01/07/2018   BUN 41 (H) 01/07/2018   CREATININE 1.35 (H) 01/07/2018   PROT 7.9 01/01/2018   ALBUMIN 3.8 01/01/2018   BILITOT 0.5 01/01/2018   ALKPHOS 92 01/01/2018   AST 25 01/01/2018   ALT 17 01/01/2018  .   Total Time in preparing paper work, data evaluation and todays exam - 67 minutes  Phillips Climes M.D on 01/09/2018 at 12:35 PM  [facility]   Office  [phone]

## 2018-01-09 NOTE — Progress Notes (Signed)
Pt admitted to 4W02 accompanied by family. No complaint of pain or discomfort voiced. Daughter at bedside. Oriented to floor, room, call bell, and no fall policy. No further questions from pt or family at this time.  Christerpher Clos W Cyndee Giammarco

## 2018-01-09 NOTE — H&P (Signed)
Physical Medicine and Rehabilitation Admission H&P    Chief Complaint  Patient presents with  . Fall  . Head Injury  : HPI: [name] is an 84 year old right-handed female with history of diabetes mellitus, hypertension, CVA, atrial fibrillation maintained on xarelto as well as small bowel obstruction. Per chart review and family, patient lives alone. Independent with assistive device prior to admission. One level home. Plans to stay with her daughter and assistance as needed on discharge. Presented 01/01/2018 with slurred speech as well as question seizure. CT/MRI showed no acute intracranial process. 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 Noted blood sugar 596 and blood pressure in the 200s. EEG with no seizure. Findings consistent with encephalopathy. Patient was loaded with Keppra for seizure prophylaxis. She did require intubation until 01/05/2018.  Repeat MRI reviewed, unremarkable for acute intracranial process.  Therapy evaluations completed with recommendations of physical medicine rehabilitation consult. Patient was admitted for a comprehensive rehabilitation program. She is currently tolerating a regular consistency diet. She remains on Xarelto has prior to admission. Therapy evaluations completed with recommendations of physical medicine rehabilitation consult. Patient was admitted for a comprehensive rehabilitation program.  Review of Systems  Unable to perform ROS: Mental acuity   Past Medical History:  Diagnosis Date  . A-fib [name]) 2019   Per patients daughter, [name] [phone]  . ANEMIA, IRON DEFICIENCY 08/11/2008  . ANGIOEDEMA 03/01/2008  . CEREBROVASCULAR ACCIDENT, HX OF 03/01/2008  . CONTACT DERMATITIS&OTHER ECZEMA DUE UNSPEC CAUSE 11/28/2008  . DIABETES MELLITUS 03/01/2008  . Dysuria 01/04/2010  . GERD 03/01/2008  . HYPERSOMNIA 05/30/2009  . HYPERTENSION 03/01/2008  . INTERNAL HEMORRHOIDS 03/01/2008  . KNEE PAIN, RIGHT 09/05/2009  . NONSPECIFIC ABN FINDNG RAD&OTH EXAM  BILARY TRCT 03/07/2008  . OBESITY 01/10/2010  . PERIPHERAL EDEMA 08/10/2008  . POLYP, GALLBLADDER 03/07/2008  . SMALL BOWEL OBSTRUCTION, HX OF 03/01/2008  . SNORING 05/11/2009  . Stroke ([place]) 04/08/2016   TIA  . THROMBOCYTOSIS 08/18/2008   Past Surgical History:  Procedure Laterality Date  . HERNIA REPAIR  09/14/08   with release [facility]-Dr. Dalbert Batman  . SMALL INTESTINE SURGERY    . 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 Diabetes Mother   . Heart disease Mother   . Diabetes Sister   . Heart disease Sister   . Colitis Sister   . Kidney disease Brother   . Breast cancer Other        Neice  . Allergies Other        Nephrew  . Asthma Other        Nephrew   Social History:  reports that she has never smoked. She has never used smokeless tobacco. She reports that she does not drink alcohol or use drugs. Allergies:  Allergies  Allergen Reactions  . Ace Inhibitors Swelling    Angioedema   . Prednisone Nausea And Vomiting, Other (See Comments) and Hypertension    Blood sugar increased (also)  . Sulfa Antibiotics Hives  . Penicillins Rash    Has patient had a PCN reaction causing immediate rash, facial/tongue/throat swelling, SOB or lightheadedness with hypotension: Yes Has patient had a PCN reaction causing severe rash involving mucus membranes or skin necrosis: No Has patient had a PCN reaction that required hospitalization: No Has patient had a PCN reaction occurring within the last 10 years: No If all of the above answers are "NO", then  may proceed with Cephalosporin use.   . Sulfamethoxazole Rash  . Sulfonamide Derivatives Rash   Medications Prior to Admission  Medication Sig Dispense Refill  . Cholecalciferol (VITAMIN D3 PO) Take 1 tablet by mouth daily with lunch.    . cloNIDine (CATAPRES) 0.2 MG tablet Take 1 tablet (0.2 mg total) by mouth 2 (two) times daily. (Patient taking  differently: Take 0.2 mg by mouth 3 (three) times daily. ) 60 tablet 3  . diltiazem (CARDIZEM CD) 180 MG 24 hr capsule Take 1 capsule (180 mg total) by mouth daily. 30 capsule 3  . feeding supplement, GLUCERNA SHAKE, (GLUCERNA SHAKE) LIQD Take 237 mLs by mouth See admin instructions. Drink one can (237 mls) by mouth once or twice daily as meal replacement    . Insulin Detemir (LEVEMIR FLEXTOUCH) 100 UNIT/ML Pen Inject 18 Units into the skin 2 (two) times daily. Inject 20 units subcutaneously twice daily - after lunch and supper (Patient taking differently: Inject 15-23 Units into the skin See admin instructions. Use 25 units every morning and use 18 units every evening) 15 mL 11  . IRON PO Take 325 mg by mouth daily with lunch.     . losartan (COZAAR) 100 MG tablet Take 100 mg by mouth daily.    . Multiple Vitamin (MULTIVITAMIN WITH MINERALS) TABS tablet Take 1 tablet by mouth daily.    Marland Kitchen omeprazole (PRILOSEC OTC) 20 MG tablet Take 20 mg by mouth daily with breakfast.     . polycarbophil (FIBERCON) 625 MG tablet Take 625 mg by mouth daily as needed for mild constipation.    . Rivaroxaban (XARELTO) 15 MG TABS tablet Take 1 tablet (15 mg total) by mouth daily with supper. 42 tablet 3  . spironolactone (ALDACTONE) 25 MG tablet Take 12.5 mg by mouth daily.   3  . vitamin B-12 (CYANOCOBALAMIN) 500 MCG tablet Take 500 mcg by mouth daily with lunch.    . metFORMIN (GLUCOPHAGE) 500 MG tablet Take 500 mg by mouth daily.  3    Drug Regimen Review Drug regimen was reviewed and remains appropriate with no significant issues identified  Home: Home Living Family/patient expects to be discharged to:: Private residence Living Arrangements: Alone Available Help at Discharge: Family, Available 24 hours/day, Other (Comment)(plans to go to daughter's, will arrange 24/7) Type of Home: House(daughter's) Home Access: Stairs to enter Entrance Stairs-Number of Steps: 3 Home Layout: One level Home Equipment: [place] -  single point Additional Comments: information gathered from chart review. question if patient is reliable    Functional History: Prior Function Level of Independence: Independent with assistive device(s) Comments: Per chart/RN pt used a cane PTA and drove.  Functional Status:  Mobility: Bed Mobility Overal bed mobility: Needs Assistance Bed Mobility: Supine to Sit Supine to sit: Min assist General bed mobility comments: Pt required assistance to advance B LEs to edge of bed and to elevate trunk into sitting.   Transfers Overall transfer level: Needs assistance Equipment used: Rolling walker (2 wheeled) Transfers: Sit to/from Stand Sit to Stand: Min assist General transfer comment: needs cues for hand placement and safety.  Pt attempts to pull on RW for support vs.  pushing from seated surface.   Ambulation/Gait Ambulation/Gait assistance: +2 safety/equipment, Min assist Gait Distance (Feet): 60 Feet Assistive device: Rolling walker (2 wheeled) Gait Pattern/deviations: Step-through pattern, Trunk flexed, Decreased stride length, Decreased dorsiflexion - right, Decreased dorsiflexion - left General Gait Details: Pt able to progress ambulation with good tolerance.  She did experience  urinary incontinence during gait training.  Constant cues for posture and upper trunk control.   Gait velocity: slow and flexed.      ADL: ADL Overall ADL's : Needs assistance/impaired Eating/Feeding: Set up, Sitting Grooming: Wash/dry face, Wash/dry hands, Standing, Min guard Lower Body Bathing: Moderate assistance, Sit to/from stand Toilet Transfer: Minimal assistance, RW, Comfort height toilet, Ambulation, Grab bars, Cueing for safety Toilet Transfer Details (indicate cue type and reason): pt walking into surfaces with RW on R and L side without awareness Toileting- Clothing Manipulation and Hygiene: Minimal assistance, Sit to/from stand, Cueing for safety Functional mobility during ADLs: Minimal  assistance, Rolling walker, Cueing for safety General ADL Comments: pt requires redirection throughout session  Cognition: Cognition Overall Cognitive Status: Impaired/Different from baseline Orientation Level: Oriented X4 Cognition Arousal/Alertness: Awake/alert Behavior During Therapy: Impulsive Overall Cognitive Status: Impaired/Different from baseline Area of Impairment: Safety/judgement, Problem solving, Awareness, Following commands, Memory, Attention Orientation Level: Disoriented to, Time, Situation Current Attention Level: Sustained Memory: Decreased short-term memory Following Commands: Follows one step commands inconsistently, Follows one step commands with increased time Safety/Judgement: Decreased awareness of safety, Decreased awareness of deficits Awareness: Emergent Problem Solving: Slow processing, Difficulty sequencing, Requires verbal cues, Requires tactile cues General Comments: HOH, needs repitition and multimodal cues for safety during ADL mobility  Physical Exam: Blood pressure 118/61, pulse 93, temperature 97.9 F (36.6 C), temperature source Oral, resp. rate 20, height 5\' 1"  (1.549 m), weight 85 kg, SpO2 98 %. Physical Exam  Vitals reviewed. Constitutional: She appears well-developed.  Morbidly obese  HENT:  Head: Normocephalic and atraumatic.  Eyes: Right eye exhibits no discharge. Left eye exhibits no discharge.  Only briefly opens eyes  Neck: Normal range of motion. Neck supple. No thyromegaly present.  Cardiovascular: Normal rate and regular rhythm.  Respiratory: Effort normal.  Decreased breath sounds but clear to auscultation  GI: Soft. Bowel sounds are normal. She exhibits no distension.  Musculoskeletal:     Comments: No edema or tenderness in extremities  Neurological:  Lethargic, with difficulty staying awake Limited historian.  Motor: Limited by lethargy Inconsistently follows commands ?  Aphasia  Skin: Skin is warm and dry.    Psychiatric:  Unable to assess due to lethargy    Results for orders placed or performed during the hospital encounter of 01/01/18 (from the past 48 hour(s))  Glucose, capillary     Status: Abnormal   Collection Time: 01/07/18  7:58 AM  Result Value Ref Range   Glucose-Capillary 59 (L) 70 - 99 mg/dL  Glucose, capillary     Status: Abnormal   Collection Time: 01/07/18  8:59 AM  Result Value Ref Range   Glucose-Capillary 163 (H) 70 - 99 mg/dL  Glucose, capillary     Status: Abnormal   Collection Time: 01/07/18  2:09 PM  Result Value Ref Range   Glucose-Capillary 234 (H) 70 - 99 mg/dL  Glucose, capillary     Status: Abnormal   Collection Time: 01/07/18  4:05 PM  Result Value Ref Range   Glucose-Capillary 193 (H) 70 - 99 mg/dL  Glucose, capillary     Status: Abnormal   Collection Time: 01/07/18  7:56 PM  Result Value Ref Range   Glucose-Capillary 153 (H) 70 - 99 mg/dL  Glucose, capillary     Status: Abnormal   Collection Time: 01/08/18 12:19 AM  Result Value Ref Range   Glucose-Capillary 151 (H) 70 - 99 mg/dL  Glucose, capillary     Status: Abnormal   Collection Time: 01/08/18  8:45 AM  Result Value Ref Range   Glucose-Capillary 130 (H) 70 - 99 mg/dL  Glucose, capillary     Status: Abnormal   Collection Time: 01/08/18 12:50 PM  Result Value Ref Range   Glucose-Capillary 173 (H) 70 - 99 mg/dL  Glucose, capillary     Status: Abnormal   Collection Time: 01/08/18  5:30 PM  Result Value Ref Range   Glucose-Capillary 204 (H) 70 - 99 mg/dL  Glucose, capillary     Status: Abnormal   Collection Time: 01/08/18  9:19 PM  Result Value Ref Range   Glucose-Capillary 288 (H) 70 - 99 mg/dL   Dg Chest Port 1 View  Result Date: 01/08/2018 CLINICAL DATA:  Shortness of breath, cough EXAM: PORTABLE CHEST 1 VIEW COMPARISON:  01/04/2018 FINDINGS: Mildly low lung volumes. No focal airspace disease or effusion. Stable cardiomediastinal silhouette with aortic atherosclerosis. No pneumothorax.  Surgical clips in the left upper quadrant. IMPRESSION: No active disease.  Low lung volumes. Electronically Signed   By: Donavan Foil M.D.   On: 01/08/2018 21:01   Dg Swallowing Func-speech Pathology  Result Date: 01/07/2018 Objective Swallowing Evaluation: Type of Study: MBS-Modified Barium Swallow Study  Patient Details Name: ANISSA ABBS MRN: [id number] Date of Birth: Feb 05, 1932 Today's Date: 01/07/2018 Time: SLP Start Time (ACUTE ONLY): 1300 -SLP Stop Time (ACUTE ONLY): 1329 SLP Time Calculation (min) (ACUTE ONLY): 29 min Past Medical History: Past Medical History: Diagnosis Date . A-fib [facility]) 2019  Per patients daughter, [name] [phone] . ANEMIA, IRON DEFICIENCY 08/11/2008 . ANGIOEDEMA 03/01/2008 . CEREBROVASCULAR ACCIDENT, HX OF 03/01/2008 . CONTACT DERMATITIS&OTHER ECZEMA DUE UNSPEC CAUSE 11/28/2008 . DIABETES MELLITUS 03/01/2008 . Dysuria 01/04/2010 . GERD 03/01/2008 . HYPERSOMNIA 05/30/2009 . HYPERTENSION 03/01/2008 . INTERNAL HEMORRHOIDS 03/01/2008 . KNEE PAIN, RIGHT 09/05/2009 . NONSPECIFIC ABN FINDNG RAD&OTH EXAM BILARY TRCT 03/07/2008 . OBESITY 01/10/2010 . PERIPHERAL EDEMA 08/10/2008 . POLYP, GALLBLADDER 03/07/2008 . SMALL BOWEL OBSTRUCTION, HX OF 03/01/2008 . SNORING 05/11/2009 . Stroke ([place]) 04/08/2016  TIA . THROMBOCYTOSIS 08/18/2008 Past Surgical History: Past Surgical History: Procedure Laterality Date . HERNIA REPAIR  09/14/08  with release [facility]-Dr. Dalbert Batman . SMALL INTESTINE SURGERY   . 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 female admitted for status epilepticus requiring intubation 12/12-12/16.  MRI negative for acute changes. Pt with significant PMH of stroke, obesity, GERD, SBO, HTN, DM, anemia (iron deficient), and A-fib.  Subjective: pt denies any dysphagia Assessment / Plan / Recommendation CHL IP CLINICAL IMPRESSIONS 01/07/2018 Clinical Impression Patient present with pharyngeal dysphagia likely secondary from intubation. Oral phase of  swallow was within normal limits. She had delayed swallow at the pyriform sinuses with all thin liquid boluses and at the vallecule with purees and solids. Reduced epiglottic inversion with thins, but near completion with purees and solids. Reduced pharyngeal peristalis and reduced laryngeal elevation resulting in pharyngeal residue in valleculae and pyriform sinuses with all consistencies. Patient had no penetration or aspiration with any consistency, however she did have clearing throughout exam after flouro was cut off. Suspect clearing was due to residue. In esophageal sweep, the esophagus was clear and no reflux noted. Recommend patient continue regular diet with thin liquids, with no straw. Aspiration precautions of sitting up as straight as possible for all meals, taking small sips and bites with a second swallow. Speech therapy to follow to monitor diet tolerance, pharyngeal strengthening exercises as well as patient and family education.  SLP Visit  Diagnosis Dysphagia, pharyngeal phase (R13.13) Attention and concentration deficit following -- Frontal lobe and executive function deficit following -- Impact on safety and function Mild aspiration risk   CHL IP TREATMENT RECOMMENDATION 01/07/2018 Treatment Recommendations Therapy as outlined in treatment plan below   Prognosis 01/07/2018 Prognosis for Safe Diet Advancement Good Barriers to Reach Goals -- Barriers/Prognosis Comment -- CHL IP DIET RECOMMENDATION 01/07/2018 SLP Diet Recommendations Regular solids;Thin liquid Liquid Administration via Cup;No straw Medication Administration Whole meds with puree Compensations Minimize environmental distractions;Slow rate;Small sips/bites;Effortful swallow Postural Changes Seated upright at 90 degrees   CHL IP OTHER RECOMMENDATIONS 01/07/2018 Recommended Consults -- Oral Care Recommendations Oral care BID Other Recommendations --   CHL IP FOLLOW UP RECOMMENDATIONS 01/06/2018 Follow up Recommendations (No Data)   CHL  IP FREQUENCY AND DURATION 01/07/2018 Speech Therapy Frequency (ACUTE ONLY) min 2x/week Treatment Duration 1 week      CHL IP ORAL PHASE 01/07/2018 Oral Phase WFL Oral - Pudding Teaspoon -- Oral - Pudding Cup -- Oral - Honey Teaspoon -- Oral - Honey Cup -- Oral - Nectar Teaspoon -- Oral - Nectar Cup -- Oral - Nectar Straw -- Oral - Thin Teaspoon -- Oral - Thin Cup -- Oral - Thin Straw -- Oral - Puree -- Oral - Mech Soft -- Oral - Regular -- Oral - Multi-Consistency -- Oral - Pill -- Oral Phase - Comment --  CHL IP PHARYNGEAL PHASE 01/07/2018 Pharyngeal Phase Impaired Pharyngeal- Pudding Teaspoon Delayed swallow initiation-vallecula;Reduced epiglottic inversion;Reduced laryngeal elevation;Reduced tongue base retraction;Pharyngeal residue - valleculae;Pharyngeal residue - pyriform;Reduced pharyngeal peristalsis Pharyngeal -- Pharyngeal- Pudding Cup -- Pharyngeal -- Pharyngeal- Honey Teaspoon -- Pharyngeal -- Pharyngeal- Honey Cup -- Pharyngeal -- Pharyngeal- Nectar Teaspoon -- Pharyngeal -- Pharyngeal- Nectar Cup -- Pharyngeal -- Pharyngeal- Nectar Straw -- Pharyngeal -- Pharyngeal- Thin Teaspoon -- Pharyngeal -- Pharyngeal- Thin Cup Delayed swallow initiation-pyriform sinuses;Reduced epiglottic inversion;Reduced anterior laryngeal mobility;Reduced laryngeal elevation;Reduced airway/laryngeal closure;Reduced tongue base retraction;Pharyngeal residue - valleculae;Pharyngeal residue - pyriform;Reduced pharyngeal peristalsis Pharyngeal -- Pharyngeal- Thin Straw Delayed swallow initiation-pyriform sinuses;Reduced epiglottic inversion;Reduced anterior laryngeal mobility;Reduced laryngeal elevation;Reduced airway/laryngeal closure;Reduced tongue base retraction;Pharyngeal residue - valleculae;Pharyngeal residue - pyriform Pharyngeal -- Pharyngeal- Puree -- Pharyngeal -- Pharyngeal- Mechanical Soft Delayed swallow initiation-vallecula;Reduced pharyngeal peristalsis;Reduced anterior laryngeal mobility;Reduced laryngeal  elevation;Reduced airway/laryngeal closure;Reduced tongue base retraction;Pharyngeal residue - valleculae;Pharyngeal residue - pyriform Pharyngeal -- Pharyngeal- Regular -- Pharyngeal -- Pharyngeal- Multi-consistency -- Pharyngeal -- Pharyngeal- Pill -- Pharyngeal -- Pharyngeal Comment --  CHL IP CERVICAL ESOPHAGEAL PHASE 01/07/2018 Cervical Esophageal Phase WFL Pudding Teaspoon -- Pudding Cup -- Honey Teaspoon -- Honey Cup -- Nectar Teaspoon -- Nectar Cup -- Nectar Straw -- Thin Teaspoon -- Thin Cup -- Thin Straw -- Puree -- Mechanical Soft -- Regular -- Multi-consistency -- Pill -- Cervical Esophageal Comment -- [place], MA, CCC-SLP 01/07/2018 1:57 PM                  Medical Problem List and Plan: 1.   Debility secondary to suspect seizure/encephalopathy. EEG negative 2.  DVT Prophylaxis/Anticoagulation: Xarelto. Monitor for any signs of DVT 3. Pain Management:  Tylenol as needed 4. Mood:  Provide emotional support 5. Neuropsych: This patient is not capable of making decisions on her own behalf. 6. Skin/Wound Care: Routine skin checks 7. Fluids/Electrolytes/Nutrition:  Routine and analysis with follow-up chemistries 8. Atrial fibrillation. Cardiac rate controlled. Continue Xarelto 9. Seizure prophylaxis. Keppra 500 mg twice a day. EEG negative 10. Hypertension. Clonidine 0.2 mg twice a day, Cardizem 30 mg every 8 hours  11.Diabetes Mellitus with peripheral neuropathy. Hemoglobin A1c 13. Diabetes mellitus: Levemir 16 units twice a day. Check blood sugars before meals and at bedtime. Diabetic teaching 12.  Morbid obesity: Encouraged weight loss  Post Admission Physician Evaluation: 1. Preadmission assessment reviewed and changes made below. 2. Functional deficits secondary  to encephalopathy. 3. Patient is admitted to receive collaborative, interdisciplinary care between the physiatrist, rehab nursing staff, and therapy team. 4. Patient's level of medical complexity and substantial  therapy needs in context of that medical necessity cannot be provided at a lesser intensity of care such as a SNF. 5. Patient has experienced substantial functional loss from his/her baseline which was documented above under the "Functional History" and "Functional Status" headings.  Judging by the patient's diagnosis, physical exam, and functional history, the patient has potential for functional progress which will result in measurable gains while on inpatient rehab.  These gains will be of substantial and practical use upon discharge  in facilitating mobility and self-care at the household level. 6. Physiatrist will provide 24 hour management of medical needs as well as oversight of the therapy plan/treatment and provide guidance as appropriate regarding the interaction of the two. 7. 24 hour rehab nursing will assist with bladder management, safety, skin/wound care, disease management, medication administration, pain management and patient education  and help integrate therapy concepts, techniques,education, etc. 8. PT will assess and treat for/with: Lower extremity strength, range of motion, stamina, balance, functional mobility, safety, adaptive techniques and equipment, coping skills, pain control, education. Goals are: Supervision. 9. OT will assess and treat for/with: ADL's, functional mobility, safety, upper extremity strength, adaptive techniques and equipment, ego support, and community reintegration.   Goals are: Supervision. Therapy may proceed with showering this patient. 10. SLP will assess and treat for/with: Language, cognition.  Goals are: Supervision. 11. Case Management and Social Worker will assess and treat for psychological issues and discharge planning. 12. Team conference will be held weekly to assess progress toward goals and to determine barriers to discharge. 13. Patient will receive at least 3 hours of therapy per day at least 5 days per week. 14. ELOS: 8-13 days.        15. Prognosis:  good  I have personally performed a face to face diagnostic evaluation, including, but not limited to relevant history and physical exam findings, of this patient and developed relevant assessment and plan.  Additionally, I have reviewed and concur with the physician assistant's documentation above.  The patient's status has not changed. The original post admission physician evaluation remains appropriate, and any changes from the pre-admission screening or documentation from the acute chart are noted above.   Delice Lesch, MD, ABPMR Lavon Paganini Angiulli, PA-C 01/09/2018

## 2018-01-10 ENCOUNTER — Inpatient Hospital Stay (HOSPITAL_COMMUNITY): Payer: Medicare HMO | Admitting: Occupational Therapy

## 2018-01-10 ENCOUNTER — Inpatient Hospital Stay (HOSPITAL_COMMUNITY): Payer: Medicare HMO

## 2018-01-10 ENCOUNTER — Inpatient Hospital Stay (HOSPITAL_COMMUNITY): Payer: Medicare HMO | Admitting: Speech Pathology

## 2018-01-10 DIAGNOSIS — G9340 Encephalopathy, unspecified: Secondary | ICD-10-CM

## 2018-01-10 LAB — GLUCOSE, CAPILLARY
Glucose-Capillary: 111 mg/dL — ABNORMAL HIGH (ref 70–99)
Glucose-Capillary: 138 mg/dL — ABNORMAL HIGH (ref 70–99)
Glucose-Capillary: 175 mg/dL — ABNORMAL HIGH (ref 70–99)
Glucose-Capillary: 188 mg/dL — ABNORMAL HIGH (ref 70–99)
Glucose-Capillary: 383 mg/dL — ABNORMAL HIGH (ref 70–99)

## 2018-01-10 NOTE — Progress Notes (Signed)
[name] is a 84 y.o. female who was admitted for CIR yesterday with general debility secondary to suspected seizure and encephalopathy.  Past Medical History:  Diagnosis Date  . A-fib [facility]) 2019   Per patients daughter, [name] [phone]  . ANEMIA, IRON DEFICIENCY 08/11/2008  . ANGIOEDEMA 03/01/2008  . CEREBROVASCULAR ACCIDENT, HX OF 03/01/2008  . CONTACT DERMATITIS&OTHER ECZEMA DUE UNSPEC CAUSE 11/28/2008  . DIABETES MELLITUS 03/01/2008  . Dysuria 01/04/2010  . GERD 03/01/2008  . HYPERSOMNIA 05/30/2009  . HYPERTENSION 03/01/2008  . INTERNAL HEMORRHOIDS 03/01/2008  . KNEE PAIN, RIGHT 09/05/2009  . NONSPECIFIC ABN FINDNG RAD&OTH EXAM BILARY TRCT 03/07/2008  . OBESITY 01/10/2010  . PERIPHERAL EDEMA 08/10/2008  . POLYP, GALLBLADDER 03/07/2008  . SMALL BOWEL OBSTRUCTION, HX OF 03/01/2008  . SNORING 05/11/2009  . Stroke ([place]) 04/08/2016   TIA  . THROMBOCYTOSIS 08/18/2008   '  Subjective: No new complaints.  Somnolent and noncommunicative  Objective: Vital signs in last 24 hours: Temp:  [97.8 F (36.6 C)-98.9 F (37.2 C)] 97.8 F (36.6 C) (12/21 0604) Pulse Rate:  [81-102] 81 (12/21 0604) Resp:  [20-22] 20 (12/21 0604) BP: (93-168)/(38-68) 166/64 (12/21 0604) SpO2:  [96 %-100 %] 99 % (12/21 0604) Weight:  [85.6 kg-86.3 kg] 86.3 kg (12/21 0604) Weight change:  Last BM Date: 01/09/18  Intake/Output from previous day: 12/20 0701 - 12/21 0700 In: 240 [P.O.:240] Out: -  Last cbgs: CBG (last 3)  Recent Labs    01/09/18 2057 01/10/18 0014 01/10/18 0625  GLUCAP 178* 175* 111*   Lab Results  Component Value Date   HGBA1C 13.0 (H) 01/01/2018    Patient Vitals for the past 24 hrs:  BP Temp Temp src Pulse Resp SpO2 Height Weight  01/10/18 0604 (!) 166/64 97.8 F (36.6 C) Oral 81 20 99 % - 86.3 kg  01/10/18 0015 (!) 118/55 98.9 F (37.2 C) Oral 89 - 98 % - -  01/09/18 1934 (!) 155/68 - - (!) 102 - - - -  01/09/18 1933 (!) 93/38 98.3 F (36.8 C) Oral 99 - 99 % - -  01/09/18  1808 (!) 168/65 98.9 F (37.2 C) - 99 20 100 % 5\' 1"  (1.549 m) 85.6 kg      Physical Exam General: No apparent distress somnolent obese HEENT: not dry poor visualization Lungs: Normal effort. Lungs clear to auscultation, no crackles or wheezes. Cardiovascular: Regular rate and rhythm, no edema Abdomen: S/NT/ND; BS(+) Musculoskeletal:  unchanged Neurological: Lethargic Wounds: N/A    Skin: clear   Mental state: Somnolent, does not follow commands, noncommunicative    Lab Results: BMET    Component Value Date/Time   NA 138 01/07/2018 0330   K 4.4 01/07/2018 0330   CL 106 01/07/2018 0330   CO2 25 01/07/2018 0330   GLUCOSE 105 (H) 01/07/2018 0330   BUN 41 (H) 01/07/2018 0330   CREATININE 1.35 (H) 01/07/2018 0330   CALCIUM 9.1 01/07/2018 0330   GFRNONAA 36 (L) 01/07/2018 0330   GFRAA 42 (L) 01/07/2018 0330   CBC    Component Value Date/Time   WBC 12.7 (H) 01/07/2018 0330   RBC 3.85 (L) 01/07/2018 0330   HGB 10.4 (L) 01/07/2018 0330   HCT 31.9 (L) 01/07/2018 0330   PLT 400 01/07/2018 0330   MCV 82.9 01/07/2018 0330   MCH 27.0 01/07/2018 0330   MCHC 32.6 01/07/2018 0330   RDW 16.3 (H) 01/07/2018 0330   LYMPHSABS 3.0 01/03/2018 0651   MONOABS 1.8 (H) 01/03/2018  2575   EOSABS 0.2 01/03/2018 0651   BASOSABS 0.1 01/03/2018 0518    Studies/Results: Dg Chest Port 1 View  Result Date: 01/08/2018 CLINICAL DATA:  Shortness of breath, cough EXAM: PORTABLE CHEST 1 VIEW COMPARISON:  01/04/2018 FINDINGS: Mildly low lung volumes. No focal airspace disease or effusion. Stable cardiomediastinal silhouette with aortic atherosclerosis. No pneumothorax. Surgical clips in the left upper quadrant. IMPRESSION: No active disease.  Low lung volumes. Electronically Signed   By: Donavan Foil M.D.   On: 01/08/2018 21:01    Medications: I have reviewed the patient's current medications.  Assessment/Plan:  General debility secondary to suspected seizure/encephalopathy Diabetes mellitus.   Continue present regimen.  Fasting blood sugar today 111 Atrial fibrillation.  Continue anticoagulation Seizure prophylaxis.  Continue Keppra Essential hypertension.  Somewhat labile.  No change in medical regimen    Length of stay, days: 1  Marletta Lor , MD 01/10/2018, 9:10 AM

## 2018-01-10 NOTE — Evaluation (Signed)
Speech Language Pathology Assessment and Plan  Patient Details  Name: [name] MRN: [id number] Date of Birth: May 27, 1932  SLP Diagnosis: Dysphagia;Cognitive Impairments  Rehab Potential: Good ELOS: 5 days     Today's Date: 01/10/2018 SLP Individual Time: 6270-3500 SLP Individual Time Calculation (min): 58 min   Problem List:  Patient Active Problem List   Diagnosis Date Noted  . Acute encephalopathy 01/09/2018  . PAF (paroxysmal atrial fibrillation) ([place])   . Type 2 diabetes mellitus with peripheral neuropathy (HCC)   . Morbid obesity ([place])   . Status epilepticus ([place]) 01/01/2018  . Accelerated hypertension 01/01/2018  . Acute kidney injury ([place]) 01/01/2018  . Type 2 diabetes mellitus ([place]) 01/01/2018  . Respiratory failure ([place])   . Hyperkalemia 07/25/2017  . Bradycardia 07/25/2017  . Hypotension 07/25/2017  . History of stroke 07/25/2017  . ARF (acute renal failure) ([place]) 07/25/2017  . TIA (transient ischemic attack) 04/08/2016  . Hypocalcemia 04/08/2016  . OBESITY 01/10/2010  . DYSURIA 01/04/2010  . KNEE PAIN, RIGHT 09/05/2009  . HYPERSOMNIA 05/30/2009  . URI 05/11/2009  . SNORING 05/11/2009  . CONTACT DERMATITIS&OTHER ECZEMA DUE UNSPEC CAUSE 11/28/2008  . THROMBOCYTOSIS 08/18/2008  . ANEMIA, IRON DEFICIENCY 08/11/2008  . PERIPHERAL EDEMA 08/10/2008  . POLYP, GALLBLADDER 03/07/2008  . NONSPECIFIC ABN FINDNG RAD&OTH EXAM BILARY TRCT 03/07/2008  . Diabetes mellitus type II, uncontrolled ([place]) 03/01/2008  . Essential hypertension 03/01/2008  . INTERNAL HEMORRHOIDS 03/01/2008  . GERD 03/01/2008  . ANGIOEDEMA 03/01/2008  . CEREBROVASCULAR ACCIDENT, HX OF 03/01/2008  . SMALL BOWEL OBSTRUCTION, HX OF 03/01/2008   Past Medical History:  Past Medical History:  Diagnosis Date  . A-fib [facility]) 2019   Per patients daughter, [name] [phone]  . ANEMIA, IRON DEFICIENCY 08/11/2008  . ANGIOEDEMA 03/01/2008  . CEREBROVASCULAR ACCIDENT, HX OF 03/01/2008  . CONTACT  DERMATITIS&OTHER ECZEMA DUE UNSPEC CAUSE 11/28/2008  . DIABETES MELLITUS 03/01/2008  . Dysuria 01/04/2010  . GERD 03/01/2008  . HYPERSOMNIA 05/30/2009  . HYPERTENSION 03/01/2008  . INTERNAL HEMORRHOIDS 03/01/2008  . KNEE PAIN, RIGHT 09/05/2009  . NONSPECIFIC ABN FINDNG RAD&OTH EXAM BILARY TRCT 03/07/2008  . OBESITY 01/10/2010  . PERIPHERAL EDEMA 08/10/2008  . POLYP, GALLBLADDER 03/07/2008  . SMALL BOWEL OBSTRUCTION, HX OF 03/01/2008  . SNORING 05/11/2009  . Stroke ([place]) 04/08/2016   TIA  . THROMBOCYTOSIS 08/18/2008   Past Surgical History:  Past Surgical History:  Procedure Laterality Date  . HERNIA REPAIR  09/14/08   with release [facility]-Dr. Dalbert Batman  . SMALL INTESTINE SURGERY    . 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 Per chart review and family, patient lives alone. Independent with assistive device prior to admission. One level home. Plans to stay with her daughter and assistance as needed on discharge. Presented 01/01/2018 with slurred speech as well as question seizure. CT/MRI showed no acute intracranial process. 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 Noted blood sugar 596 and blood pressure in the 200s. EEG with no seizure. Findings consistent with encephalopathy. Patient was loaded with Keppra for seizure prophylaxis. She did require intubation until 01/05/2018.  Repeat MRI reviewed, unremarkable for acute intracranial process.  Therapy evaluations completed with recommendations of physical medicine rehabilitation consult. Patient was admitted for a comprehensive rehabilitation program. She  is currently tolerating a regular consistency diet. She remains on Xarelto has prior to admission. Therapy evaluations completed with  recommendations of physical medicine rehabilitation consult. Patient was admitted for a comprehensive rehabilitation program.  Pt was admitted to CIR on 01/09/2018.  SLP evaluation was completed on 01/10/2018 with the following results:  Pt presents with s/s of a mild oropharyngeal dysphagia.  Pt still has swelling on the right side of her tongue which is painful and, in addition to decreased attention to boluses, leads to prolonged oral phase.  Pt had intermittent periods of oral holding of liquids which were corrected with supervision cues to swallow.  Voice was fairly consistently wet throughout PO intake which SLP suspects to be related to residue given results of MBS.  Reflexive throat clear appeared to clear voicing.  Would recommend that pt remain on her currently prescribed diet with no straws, meds whole either in puree or with liquids.   Pt presents with mild cognitive deficits which are likely at or close to baseline.  Pt is slightly distractible and has decreased safety awareness.  Her memory for basic daily events appears to be fair and she was oriented to place, general date (December, near Christmas, 2019),and situation.   Given the abovementioned deficits, pt would benefit from skilled ST while inpatient in order to maximize functional independence and reduce burden of care prior to discharge.  Anticipate that pt will need long term 24/7 supervision at discharge but do not anticipate ST needs post hospitalization.     Skilled Therapeutic Interventions          Cognitive-linguistic and bedside swallow evaluation completed with results and recommendations reviewed with patient and family.     SLP Assessment  Patient will need skilled Speech Lanaguage Pathology Services during CIR admission    Recommendations  SLP Diet Recommendations: Age appropriate regular solids;Thin Liquid Administration via: Cup;No straw Medication Administration: Whole meds with puree Supervision: Patient able to  self feed;Intermittent supervision to cue for compensatory strategies Compensations: Minimize environmental distractions;Slow rate;Small sips/bites;Multiple dry swallows after each bite/sip;Effortful swallow Postural Changes and/or Swallow Maneuvers: Seated upright 90 degrees Oral Care Recommendations: Oral care BID Patient destination: Home Follow up Recommendations: None Equipment Recommended: None recommended by SLP    SLP Frequency 3 to 5 out of 7 days   SLP Duration  SLP Intensity  SLP Treatment/Interventions 5 days   Minumum of 1-2 x/day, 30 to 90 minutes  Cognitive remediation/compensation;Cueing hierarchy;Dysphagia/aspiration precaution training;Functional tasks;Patient/family education;Internal/external aids    Pain Pain Assessment Pain Scale: 0-10 Pain Score: 0-No pain Faces Pain Scale: No hurt  Prior Functioning Cognitive/Linguistic Baseline: Within functional limits Type of Home: House  Lives With: Alone Available Help at Discharge: Family;Available 24 hours/day;Other (Comment)  Short Term Goals: Week 1: SLP Short Term Goal 1 (Week 1): STG=LTG due to ELOS  Refer to Care Plan for Long Term Goals  Recommendations for other services: None   Discharge Criteria: Patient will be discharged from SLP if patient refuses treatment 3 consecutive times without medical reason, if treatment goals not met, if there is a change in medical status, if patient makes no progress towards goals or if patient is discharged from hospital.  The above assessment, treatment plan, treatment alternatives and goals were discussed and mutually agreed upon: by patient and by family  Narcissus Detwiler, Selinda Orion 01/10/2018, 10:08 AM

## 2018-01-10 NOTE — Progress Notes (Signed)
Pt's daughter states,  "you know my sister said that a doctor told her that mother had the beginning stages of dementia, do you think that could be it?" This Probation officer informed pt's daughter that would have to be determined by the doctor and could not be diagnosed by myself, however it is possible.

## 2018-01-10 NOTE — Plan of Care (Signed)
  Problem: Consults Goal: RH GENERAL PATIENT EDUCATION Description See Patient Education module for education specifics. Outcome: Progressing   Problem: RH BOWEL ELIMINATION Goal: RH STG MANAGE BOWEL WITH ASSISTANCE Description STG Manage Bowel with mod I Assistance.  Outcome: Progressing   Problem: RH SKIN INTEGRITY Goal: RH STG MAINTAIN SKIN INTEGRITY WITH ASSISTANCE Description STG Maintain Skin Integrity With min Assistance.  Outcome: Progressing   Problem: RH SAFETY Goal: RH STG ADHERE TO SAFETY PRECAUTIONS W/ASSISTANCE/DEVICE Description STG Adhere to Safety Precautions With cues/reminders Assistance/Device.  Outcome: Progressing   Problem: RH KNOWLEDGE DEFICIT GENERAL Goal: RH STG INCREASE KNOWLEDGE OF SELF CARE AFTER HOSPITALIZATION Description Pt will be able to manage self care and direct others to provide care needed with min assist  Outcome: Progressing   Problem: Education: Goal: Ability to describe self-care measures that may prevent or decrease complications (Diabetes Survival Skills Education) will improve Outcome: Progressing Goal: Individualized Educational Video(s) Outcome: Progressing   Problem: Health Behavior/Discharge Planning: Goal: Ability to manage health-related needs will improve Description Patient will be able to manage self care or direct care needed at discharge with min assistance  Outcome: Progressing   Problem: Metabolic: Goal: Ability to maintain appropriate glucose levels will improve Description Pt will be able to explain normal glucose levels and how to manage glucose levels, hyper and hypo glycemia management, dietary restrictions and exercise regimen with min assist  Outcome: Progressing   Problem: Nutritional: Goal: Maintenance of adequate nutrition will improve Description Pt will be able to demonstrate understanding of CMM diet and adequate intake using handouts/resources with cues/supervision at discharge  Outcome:  Progressing

## 2018-01-10 NOTE — Evaluation (Signed)
Occupational Therapy Assessment and Plan  Patient Details  Name: [name] MRN: [id number] Date of Birth: 01/06/1933  OT Diagnosis: abnormal posture, cognitive deficits and muscle weakness (generalized) Rehab Potential: Rehab Potential (ACUTE ONLY): Fair ELOS: 7-9 days   Today's Date: 01/10/2018 OT Individual Time: 0700-0725 OT Individual Time Calculation (min): 25 min  and Today's Date: 01/10/2018 OT Missed Time: 50 Minutes Missed Time Reason: Patient fatigue;Patient unwilling/refused to participate without medical reason    Problem List:  Patient Active Problem List   Diagnosis Date Noted  . Acute encephalopathy 01/09/2018  . PAF (paroxysmal atrial fibrillation) ([place])   . Type 2 diabetes mellitus with peripheral neuropathy (HCC)   . Morbid obesity ([name])   . Status epilepticus ([name]) 01/01/2018  . Accelerated hypertension 01/01/2018  . Acute kidney injury ([name]) 01/01/2018  . Type 2 diabetes mellitus ([name]) 01/01/2018  . Respiratory failure ([name])   . Hyperkalemia 07/25/2017  . Bradycardia 07/25/2017  . Hypotension 07/25/2017  . History of stroke 07/25/2017  . ARF (acute renal failure) ([name]) 07/25/2017  . TIA (transient ischemic attack) 04/08/2016  . Hypocalcemia 04/08/2016  . OBESITY 01/10/2010  . DYSURIA 01/04/2010  . KNEE PAIN, RIGHT 09/05/2009  . HYPERSOMNIA 05/30/2009  . URI 05/11/2009  . SNORING 05/11/2009  . CONTACT DERMATITIS&OTHER ECZEMA DUE UNSPEC CAUSE 11/28/2008  . THROMBOCYTOSIS 08/18/2008  . ANEMIA, IRON DEFICIENCY 08/11/2008  . PERIPHERAL EDEMA 08/10/2008  . POLYP, GALLBLADDER 03/07/2008  . NONSPECIFIC ABN FINDNG RAD&OTH EXAM BILARY TRCT 03/07/2008  . Diabetes mellitus type II, uncontrolled ([name]) 03/01/2008  . Essential hypertension 03/01/2008  . INTERNAL HEMORRHOIDS 03/01/2008  . GERD 03/01/2008  . ANGIOEDEMA 03/01/2008  . CEREBROVASCULAR ACCIDENT, HX OF 03/01/2008  . SMALL BOWEL OBSTRUCTION, HX OF 03/01/2008    Past Medical History:  Past  Medical History:  Diagnosis Date  . A-fib [facility]) 2019   Per patients daughter, [name] [phone]  . ANEMIA, IRON DEFICIENCY 08/11/2008  . ANGIOEDEMA 03/01/2008  . CEREBROVASCULAR ACCIDENT, HX OF 03/01/2008  . CONTACT DERMATITIS&OTHER ECZEMA DUE UNSPEC CAUSE 11/28/2008  . DIABETES MELLITUS 03/01/2008  . Dysuria 01/04/2010  . GERD 03/01/2008  . HYPERSOMNIA 05/30/2009  . HYPERTENSION 03/01/2008  . INTERNAL HEMORRHOIDS 03/01/2008  . KNEE PAIN, RIGHT 09/05/2009  . NONSPECIFIC ABN FINDNG RAD&OTH EXAM BILARY TRCT 03/07/2008  . OBESITY 01/10/2010  . PERIPHERAL EDEMA 08/10/2008  . POLYP, GALLBLADDER 03/07/2008  . SMALL BOWEL OBSTRUCTION, HX OF 03/01/2008  . SNORING 05/11/2009  . Stroke ([name]) 04/08/2016   TIA  . THROMBOCYTOSIS 08/18/2008   Past Surgical History:  Past Surgical History:  Procedure Laterality Date  . HERNIA REPAIR  09/14/08   with release [facility]-Dr. Dalbert Batman  . SMALL INTESTINE SURGERY    . 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 year old female with history of diabetes mellitus, hypertension, CVA, atrial fibrillation maintained on xarelto as well as small bowel obstruction. Per chart review and family, patient lives alone. Independent with assistive device prior to admission. One level home. Plans to stay with her daughter and assistance as needed on discharge. Presented 01/01/2018 with slurred speech as well as question seizure. CT/MRI showed no acute intracranial process. 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 Noted blood sugar 596 and blood pressure in the 200s. EEG with no seizure. Findings consistent with encephalopathy. Patient was loaded with Keppra for seizure prophylaxis. She did require intubation until 01/05/2018.  Repeat MRI reviewed,  unremarkable for acute intracranial process.  Therapy evaluations completed with recommendations of physical medicine rehabilitation consult. Patient was  admitted for a comprehensive rehabilitation program. .  Patient transferred to CIR on 01/09/2018 .    Patient currently requires min with basic self-care skills secondary to muscle weakness, decreased cardiorespiratoy endurance, decreased initiation, decreased attention, decreased awareness, decreased problem solving and decreased safety awareness and decreased sitting balance, decreased standing balance and decreased balance strategies.  Prior to hospitalization, patient could complete ADLs  with modified independent .  Patient will benefit from skilled intervention to decrease level of assist with basic self-care skills prior to discharge home with care partner.  Anticipate patient will require 24 hour supervision and follow up home health.  OT - End of Session Activity Tolerance: Decreased this session Endurance Deficit: Yes OT Assessment Rehab Potential (ACUTE ONLY): Fair OT Barriers to Discharge: Other (comments) OT Barriers to Discharge Comments: none known at this time OT Patient demonstrates impairments in the following area(s): Balance;Cognition;Endurance;Pain;Safety OT Basic ADL's Functional Problem(s): Grooming;Bathing;Dressing;Toileting OT Transfers Functional Problem(s): Toilet;Tub/Shower OT Additional Impairment(s): None OT Plan OT Intensity: Minimum of 1-2 x/day, 45 to 90 minutes OT Frequency: 5 out of 7 days OT Duration/Estimated Length of Stay: 7-9 days OT Treatment/Interventions: Balance/vestibular training;Self Care/advanced ADL retraining;Therapeutic Exercise;Cognitive remediation/compensation;DME/adaptive equipment instruction;Pain management;UE/LE Strength taining/ROM;Community reintegration;Patient/family education;UE/LE Coordination activities;Discharge planning;Functional mobility training;Psychosocial support;Therapeutic Activities OT Self Feeding Anticipated Outcome(s): n/a OT Basic Self-Care Anticipated Outcome(s): S OT Toileting Anticipated Outcome(s): S OT  Bathroom Transfers Anticipated Outcome(s): S OT Recommendation Recommendations for Other Services: Other (comment)(none at this time) Patient destination: Home Follow Up Recommendations: Home health OT;24 hour supervision/assistance Equipment Recommended: To be determined   Skilled Therapeutic Intervention Upon entering the room, pt supine in bed sleeping soundly. Pt's daughter present in the room sleeping as well. OT having difficulty awaking patient and asked for assist from daughter to awaken pt but she was not agreeable to assist. OT making multiple attempts to wake pt for assessment but unable. OT discussing home set up, supervision level, and pt's baseline with caregiver before exiting the room.   OT Evaluation Precautions/Restrictions   no restrictions General OT Amount of Missed Time: 50 Minutes Vital Signs Therapy Vitals Temp: 98.6 F (37 C) Temp Source: Oral Pulse Rate: 77 Resp: 19 BP: (!) 145/71 Patient Position (if appropriate): Lying Oxygen Therapy SpO2: 100 % O2 Device: Room Air Home Living/Prior Functioning Home Living Available Help at Discharge: Family, Available 24 hours/day, Other (Comment) Type of Home: House Home Access: Stairs to enter, Ramped entrance(at daughters house, she is planning to stay with) Entrance Stairs-Number of Steps: 4 STE or can walk to back entrance with ramp Home Layout: One level Bathroom Shower/Tub: Tub/shower unit  Lives With: Alone Prior Function Level of Independence: Requires assistive device for independence  Able to Take Stairs?: Yes Driving: Yes Comments: Pt used cane for mobility prior to admission and was driving Cognition Overall Cognitive Status: Impaired/Different from baseline Arousal/Alertness: Lethargic Orientation Level: (pt did not answer any orientation or BIMS questions) Sensation Sensation Light Touch: Appears Intact Coordination Gross Motor Movements are Fluid and Coordinated: No Fine Motor Movements  are Fluid and Coordinated: No Motor  Motor Motor - Skilled Clinical Observations: generalized weakness Mobility  Bed Mobility Bed Mobility: Rolling Right;Rolling Left;Sit to Supine;Supine to Sit(per staff report) Rolling Right: Supervision/verbal cueing Rolling Left: Supervision/Verbal cueing Supine to Sit: Minimal Assistance - Patient > 75% Sit to Supine: Minimal Assistance - Patient > 75% Transfers Sit to Stand: Minimal Assistance -  Patient > 75%  Extremity/Trunk Assessment RUE Assessment RUE Assessment: Within Functional Limits LUE Assessment LUE Assessment: Within Functional Limits     Refer to Care Plan for Long Term Goals  Recommendations for other services: None    Discharge Criteria: Patient will be discharged from OT if patient refuses treatment 3 consecutive times without medical reason, if treatment goals not met, if there is a change in medical status, if patient makes no progress towards goals or if patient is discharged from hospital.  The above assessment, treatment plan, treatment alternatives and goals were discussed and mutually agreed upon: by family  Gypsy Decant 01/10/2018, 4:43 PM

## 2018-01-10 NOTE — Evaluation (Signed)
Physical Therapy Assessment and Plan  Patient Details  Name: [name] MRN: [id number] Date of Birth: 1932/11/06  PT Diagnosis: Abnormal posture, Difficulty walking, Impaired cognition and Muscle weakness Rehab Potential: Good ELOS: 7-9 days   Today's Date: 01/10/2018 PT Individual Time: 1100-1200  PT Individual Time Calculation (min): 60 min    Problem List:  Patient Active Problem List   Diagnosis Date Noted  . Acute encephalopathy 01/09/2018  . PAF (paroxysmal atrial fibrillation) ([place])   . Type 2 diabetes mellitus with peripheral neuropathy (HCC)   . Morbid obesity ([place])   . Status epilepticus ([place]) 01/01/2018  . Accelerated hypertension 01/01/2018  . Acute kidney injury ([place]) 01/01/2018  . Type 2 diabetes mellitus ([place]) 01/01/2018  . Respiratory failure ([place])   . Hyperkalemia 07/25/2017  . Bradycardia 07/25/2017  . Hypotension 07/25/2017  . History of stroke 07/25/2017  . ARF (acute renal failure) ([place]) 07/25/2017  . TIA (transient ischemic attack) 04/08/2016  . Hypocalcemia 04/08/2016  . OBESITY 01/10/2010  . DYSURIA 01/04/2010  . KNEE PAIN, RIGHT 09/05/2009  . HYPERSOMNIA 05/30/2009  . URI 05/11/2009  . SNORING 05/11/2009  . CONTACT DERMATITIS&OTHER ECZEMA DUE UNSPEC CAUSE 11/28/2008  . THROMBOCYTOSIS 08/18/2008  . ANEMIA, IRON DEFICIENCY 08/11/2008  . PERIPHERAL EDEMA 08/10/2008  . POLYP, GALLBLADDER 03/07/2008  . NONSPECIFIC ABN FINDNG RAD&OTH EXAM BILARY TRCT 03/07/2008  . Diabetes mellitus type II, uncontrolled ([place]) 03/01/2008  . Essential hypertension 03/01/2008  . INTERNAL HEMORRHOIDS 03/01/2008  . GERD 03/01/2008  . ANGIOEDEMA 03/01/2008  . CEREBROVASCULAR ACCIDENT, HX OF 03/01/2008  . SMALL BOWEL OBSTRUCTION, HX OF 03/01/2008    Past Medical History:  Past Medical History:  Diagnosis Date  . A-fib [facility]) 2019   Per patients daughter, [name] [phone]  . ANEMIA, IRON DEFICIENCY 08/11/2008  . ANGIOEDEMA 03/01/2008  . CEREBROVASCULAR  ACCIDENT, HX OF 03/01/2008  . CONTACT DERMATITIS&OTHER ECZEMA DUE UNSPEC CAUSE 11/28/2008  . DIABETES MELLITUS 03/01/2008  . Dysuria 01/04/2010  . GERD 03/01/2008  . HYPERSOMNIA 05/30/2009  . HYPERTENSION 03/01/2008  . INTERNAL HEMORRHOIDS 03/01/2008  . KNEE PAIN, RIGHT 09/05/2009  . NONSPECIFIC ABN FINDNG RAD&OTH EXAM BILARY TRCT 03/07/2008  . OBESITY 01/10/2010  . PERIPHERAL EDEMA 08/10/2008  . POLYP, GALLBLADDER 03/07/2008  . SMALL BOWEL OBSTRUCTION, HX OF 03/01/2008  . SNORING 05/11/2009  . Stroke ([place]) 04/08/2016   TIA  . THROMBOCYTOSIS 08/18/2008   Past Surgical History:  Past Surgical History:  Procedure Laterality Date  . HERNIA REPAIR  09/14/08   with release [facility]-Dr. Dalbert Batman  . SMALL INTESTINE SURGERY    . 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 year old female with history of diabetes mellitus, hypertension, CVA, atrial fibrillation maintained on xarelto as well as small bowel obstruction. Per chart review and family, patient lives alone. Independent with assistive device prior to admission. One level home. Plans to stay with her daughter and assistance as needed on discharge. Presented 01/01/2018 with slurred speech as well as question seizure. CT/MRI showed no acute intracranial process. 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 Noted blood sugar 596 and blood pressure in the 200s. EEG with no seizure. Findings consistent with encephalopathy. Patient was loaded with Keppra for seizure prophylaxis. She did require intubation until 01/05/2018.  Repeat MRI reviewed, unremarkable for acute intracranial process.  Therapy evaluations completed with recommendations of physical medicine rehabilitation consult. Patient was admitted for a comprehensive rehabilitation  program. She is currently tolerating a regular consistency diet. She remains on Xarelto has prior to admission. Therapy evaluations completed  with recommendations of physical medicine rehabilitation consult. Patient was admitted for a comprehensive rehabilitation program..  Patient transferred to CIR on 01/09/2018 .   Patient currently requires min with mobility secondary to muscle weakness, decreased cardiorespiratoy endurance and decreased standing balance, decreased postural control and decreased balance strategies.  Prior to hospitalization, patient was modified independent  with mobility and lived with Alone in a House home.  Home access is 4 STE or can walk to back entrance with rampStairs to enter, Ramped entrance(at daughters house, she is planning to stay with).  Patient will benefit from skilled PT intervention to maximize safe functional mobility, minimize fall risk and decrease caregiver burden for planned discharge home with 24 hour supervision.  Anticipate patient will benefit from follow up [place] at discharge.  PT - End of Session Activity Tolerance: Tolerates 30+ min activity with multiple rests Endurance Deficit: Yes PT Assessment Rehab Potential (ACUTE/IP ONLY): Good PT Patient demonstrates impairments in the following area(s): Balance;Behavior;Motor;Endurance;Nutrition;Pain;Safety;Perception PT Transfers Functional Problem(s): Bed Mobility;Bed to Chair;Car;Furniture PT Locomotion Functional Problem(s): Ambulation;Wheelchair Mobility;Stairs PT Plan PT Intensity: Minimum of 1-2 x/day ,45 to 90 minutes PT Frequency: 5 out of 7 days PT Duration Estimated Length of Stay: 7-9 days PT Treatment/Interventions: Ambulation/gait training;Community reintegration;DME/adaptive equipment instruction;Neuromuscular re-education;Stair training;UE/LE Strength taining/ROM;Wheelchair propulsion/positioning;Balance/vestibular training;Discharge planning;Functional electrical stimulation;Pain management;Skin care/wound management;Therapeutic Activities;UE/LE Coordination activities;Cognitive remediation/compensation;Functional mobility  training;Patient/family education;Disease management/prevention;Therapeutic Exercise;Visual/perceptual remediation/compensation;Psychosocial support PT Transfers Anticipated Outcome(s): supervision PT Locomotion Anticipated Outcome(s): supervision  PT Recommendation Recommendations for Other Services: Therapeutic Recreation consult Follow Up Recommendations: Home health PT Patient destination: Home Equipment Recommended: To be determined  Skilled Therapeutic Intervention Evaluation completed (see details above and below) with education on PT POC and goals and individual treatment initiated with focus on functional mobility, safety, transfers and activity tolerance. Pt seated in w/c upon PT arrival, agreeable to therapy tx and denies pain. Pt performed upper and lower body dressing while seated in w/c, min assist to hook bra and pull shirt over. Min assist to loop LEs through pants, pt performed sit<>stand with RW and min assist to pull pants over hips. Therapist donned shoes and socks. Pt transported to the gym. Pt ambulated x 150 ft with min assist, verbal cues for upright posture and to look up. Pt ascended/descended 4 steps with B handrails and min assist, step to pattern, increased work of breathing noted. Pt transported to ortho gym and performed stand pivot car transfer min assist. Pt performed bed mobility supine<>sitting on mat with min assist. Pt transported back to room and left seated in w/c with needs in reach and chair alarm set, in care of daughter.    PT Evaluation Precautions/Restrictions Precautions Precautions: Fall Precaution Comments: seizure Restrictions Weight Bearing Restrictions: No Pain Pain Assessment Pain Scale: 0-10 Pain Score: 0-No pain Home Living/Prior Functioning Home Living Available Help at Discharge: Family;Available 24 hours/day;Other (Comment) Type of Home: House Home Access: Stairs to enter;Ramped entrance(at daughters house, she is planning to stay  with) Entrance Stairs-Number of Steps: 4 STE or can walk to back entrance with ramp Home Layout: One level  Lives With: Alone Prior Function Level of Independence: Requires assistive device for independence  Able to Take Stairs?: Yes Driving: Yes Comments: Pt used cane for mobility prior to admission and was driving Cognition Overall Cognitive Status: Impaired/Different from baseline Orientation Level: Oriented to person;Oriented to place;Oriented to situation Sensation Sensation Light  Touch: Appears Intact Proprioception: Appears Intact Additional Comments: LE sensation appears intact Coordination Gross Motor Movements are Fluid and Coordinated: No Fine Motor Movements are Fluid and Coordinated: No Coordination and Movement Description: slow Motor  Motor Motor - Skilled Clinical Observations: generalized weakness  Mobility Bed Mobility Bed Mobility: Rolling Right;Rolling Left;Sit to Supine;Supine to Sit Rolling Right: Supervision/verbal cueing Rolling Left: Supervision/Verbal cueing Supine to Sit: Minimal Assistance - Patient > 75% Sit to Supine: Minimal Assistance - Patient > 75% Transfers Transfers: Sit to Stand;Stand Pivot Transfers Sit to Stand: Minimal Assistance - Patient > 75% Stand Pivot Transfers: Minimal Assistance - Patient > 75% Stand Pivot Transfer Details: Verbal cues for technique;Verbal cues for precautions/safety Transfer (Assistive device): Rolling walker Locomotion  Gait Ambulation: Yes Gait Assistance: Minimal Assistance - Patient > 75% Gait Distance (Feet): 150 Feet Assistive device: Rolling walker Gait Assistance Details: Verbal cues for precautions/safety;Verbal cues for technique Gait Gait: Yes Gait velocity: slow and flexed.   Stairs / Additional Locomotion Stairs: Yes Stairs Assistance: Minimal Assistance - Patient > 75% Stair Management Technique: Two rails Number of Stairs: 4 Height of Stairs: 6  Trunk/Postural Assessment  Cervical  Assessment Cervical Assessment: Exceptions to WFL(forward head posture) Thoracic Assessment Thoracic Assessment: Exceptions to WFL(kyphotic) Lumbar Assessment Lumbar Assessment: Exceptions to WFL(posteior pelvic tilt) Postural Control Postural Control: Deficits on evaluation(decreased balance reations)  Balance Balance Balance Assessed: Yes Static Sitting Balance Static Sitting - Level of Assistance: 5: Stand by assistance Dynamic Sitting Balance Dynamic Sitting - Level of Assistance: 5: Stand by assistance Static Standing Balance Static Standing - Level of Assistance: 4: Min assist Dynamic Standing Balance Dynamic Standing - Level of Assistance: 4: Min assist Extremity Assessment  RLE Assessment RLE Assessment: Exceptions to [facility] General Strength Comments: grossly 3- to 4/5 throughout LLE Assessment LLE Assessment: Exceptions to [facility] General Strength Comments: grossly 3- to 4/5 throughout    Refer to Care Plan for Long Term Goals  Recommendations for other services: Therapeutic Recreation  Outing/community reintegration  Discharge Criteria: Patient will be discharged from PT if patient refuses treatment 3 consecutive times without medical reason, if treatment goals not met, if there is a change in medical status, if patient makes no progress towards goals or if patient is discharged from hospital.  The above assessment, treatment plan, treatment alternatives and goals were discussed and mutually agreed upon: by patient  Netta Corrigan, PT, DPT 01/10/2018, 12:15 PM

## 2018-01-10 NOTE — Progress Notes (Signed)
Pt's daughter notified this nurse that patient presented with restless behavior and making repetitive statements to her about "don't put that cover over my feet". Per daughter "my mother usually sleeps during the night, you all are the experts what do you think about this?" Per daughter, "she was twitching also", when asked to describe twitching, daughter states "oh I mean not twitching, but moving around in the bed like she is, I just wanted you to look at her because I wasn't there when she had her episode at home, I want to make sure that she is okay". Pt assessed by this nurse, able to follow commands, pt noted with some confusion, however able to recall her name and that she is here for "rehabilitation". Otherwise neuro assessment without change from previous assessment. Vital signs and blood glucose stable. Denies pain. Pt calm, no restless behavior noted during assessment. Daughter states "I just don't know how she now is calm now that you all are in here". Daughter informed of results of assessment , viral signs, and glucose results. Pt's daughter informed that nursing staff will continue to monitor her for any changes and followup as needed. Pt resting quietly and in no distress when writer left patients room. Daughter instructed to notify this writer with any changes or "twitching" activity.

## 2018-01-11 ENCOUNTER — Inpatient Hospital Stay (HOSPITAL_COMMUNITY): Payer: Medicare HMO

## 2018-01-11 ENCOUNTER — Inpatient Hospital Stay (HOSPITAL_COMMUNITY): Payer: Medicare HMO | Admitting: Physical Therapy

## 2018-01-11 LAB — GLUCOSE, CAPILLARY
Glucose-Capillary: 111 mg/dL — ABNORMAL HIGH (ref 70–99)
Glucose-Capillary: 317 mg/dL — ABNORMAL HIGH (ref 70–99)
Glucose-Capillary: 169 mg/dL — ABNORMAL HIGH (ref 70–99)
Glucose-Capillary: 185 mg/dL — ABNORMAL HIGH (ref 70–99)

## 2018-01-11 NOTE — Progress Notes (Signed)
[name] is a 84 y.o. female admitted for CIR with general debility secondary to encephalopathy.  Past Medical History:  Diagnosis Date  . A-fib [facility]) 2019   Per patients daughter, [name] [phone]  . ANEMIA, IRON DEFICIENCY 08/11/2008  . ANGIOEDEMA 03/01/2008  . CEREBROVASCULAR ACCIDENT, HX OF 03/01/2008  . CONTACT DERMATITIS&OTHER ECZEMA DUE UNSPEC CAUSE 11/28/2008  . DIABETES MELLITUS 03/01/2008  . Dysuria 01/04/2010  . GERD 03/01/2008  . HYPERSOMNIA 05/30/2009  . HYPERTENSION 03/01/2008  . INTERNAL HEMORRHOIDS 03/01/2008  . KNEE PAIN, RIGHT 09/05/2009  . NONSPECIFIC ABN FINDNG RAD&OTH EXAM BILARY TRCT 03/07/2008  . OBESITY 01/10/2010  . PERIPHERAL EDEMA 08/10/2008  . POLYP, GALLBLADDER 03/07/2008  . SMALL BOWEL OBSTRUCTION, HX OF 03/01/2008  . SNORING 05/11/2009  . Stroke ([place]) 04/08/2016   TIA  . THROMBOCYTOSIS 08/18/2008     Subjective: No new complaints.  Slept well last night and today she is alert and conversant.  Voices no complaints  Objective: Vital signs in last 24 hours: Temp:  [98 F (36.7 C)-98.6 F (37 C)] 98.3 F (36.8 C) (12/22 0619) Pulse Rate:  [77-85] 85 (12/22 0619) Resp:  [18-19] 18 (12/22 0619) BP: (131-152)/(63-71) 131/63 (12/22 0619) SpO2:  [96 %-100 %] 99 % (12/22 0619) Weight:  [86.2 kg] 86.2 kg (12/22 0619) Weight change: 0.614 kg Last BM Date: 01/10/18  Intake/Output from previous day: 12/21 0701 - 12/22 0700 In: 480 [P.O.:480] Out: -  Last cbgs: CBG (last 3)  Recent Labs    01/10/18 1634 01/10/18 2058 01/11/18 0621  GLUCAP 138* 188* 111*   Patient Vitals for the past 24 hrs:  BP Temp Temp src Pulse Resp SpO2 Weight  01/11/18 0619 131/63 98.3 F (36.8 C) Oral 85 18 99 % 86.2 kg  01/10/18 1922 (!) 152/71 98 F (36.7 C) Oral 80 18 96 % -  01/10/18 1445 (!) 145/71 98.6 F (37 C) Oral 77 19 100 % -     Physical Exam General: No apparent distress   HEENT: not dry Lungs: Normal effort. Lungs clear to auscultation, no crackles or  wheezes. Cardiovascular: Regular rate and rhythm, no edema Abdomen: S/NT/ND; BS(+) Musculoskeletal:  unchanged Neurological: No new neurological deficits; alert and appropriate this a.m. Wounds: N/A    Skin: clear no rash Mental state: Alert, oriented, cooperative    Lab Results: BMET    Component Value Date/Time   NA 138 01/07/2018 0330   K 4.4 01/07/2018 0330   CL 106 01/07/2018 0330   CO2 25 01/07/2018 0330   GLUCOSE 105 (H) 01/07/2018 0330   BUN 41 (H) 01/07/2018 0330   CREATININE 1.35 (H) 01/07/2018 0330   CALCIUM 9.1 01/07/2018 0330   GFRNONAA 36 (L) 01/07/2018 0330   GFRAA 42 (L) 01/07/2018 0330   CBC    Component Value Date/Time   WBC 12.7 (H) 01/07/2018 0330   RBC 3.85 (L) 01/07/2018 0330   HGB 10.4 (L) 01/07/2018 0330   HCT 31.9 (L) 01/07/2018 0330   PLT 400 01/07/2018 0330   MCV 82.9 01/07/2018 0330   MCH 27.0 01/07/2018 0330   MCHC 32.6 01/07/2018 0330   RDW 16.3 (H) 01/07/2018 0330   LYMPHSABS 3.0 01/03/2018 0651   MONOABS 1.8 (H) 01/03/2018 0651   EOSABS 0.2 01/03/2018 0651   BASOSABS 0.1 01/03/2018 0651    Medications: I have reviewed the patient's current medications.  Assessment/Plan:  General debility secondary to suspected seizure disorder/encephalopathy Diabetes mellitus.  Remains controlled Atrial fibrillation.  Continue anticoagulation Essential  hypertension.  Blood pressure remains well controlled  Atrial fibrillation.  Continue anticoagulation    Length of stay, days: 2  Marletta Lor , MD 01/11/2018, 9:06 AM

## 2018-01-11 NOTE — Progress Notes (Signed)
Physical Therapy Session Note  Patient Details  Name: [name] MRN: [id number] Date of Birth: 16-Dec-1932  Today's Date: 01/11/2018 PT Individual Time: 1000-1100 PT Individual Time Calculation (min): 60 min   Short Term Goals: Week 1:  PT Short Term Goal 1 (Week 1): STG=LTG due to ELOS  Skilled Therapeutic Interventions/Progress Updates: Pt presented in w/c sleeping but easily aroused and agreeable to therapy. Pt transported to rehab gym for energy conservation and performed stand pivot to mat CGA. Participated in [place] assessment and received score 38/56 indicative of fall risk requiring use of AD. Performed alternating toe taps requiring minA for wt shifting.Also participated in game of horseshoes no AD, pt required min vc's for increasing reach and intermittent re-direction for attention to task. Pt ambulated 115f with day room with RW and cues for looking forward as pt would frequently turn head to observe surroundings. Pt returned to w/c in day room as was agreeable to participate in group dance session. Pt left with staff present and current needs met.      Therapy Documentation Precautions:  Precautions Precautions: Fall Precaution Comments: seizure Restrictions Weight Bearing Restrictions: No General:   Vital Signs:   Pain: Pain Assessment Pain Scale: 0-10 Pain Score: 0-No pain Mobility:   Locomotion :    Trunk/Postural Assessment :    Balance: Balance Balance Assessed: Yes Standardized Balance Assessment Standardized Balance Assessment: Berg Balance Test Berg Balance Test Sit to Stand: Able to stand  independently using hands Standing Unsupported: Able to stand 2 minutes with supervision Sitting with Back Unsupported but Feet Supported on Floor or Stool: Able to sit safely and securely 2 minutes Stand to Sit: Controls descent by using hands Transfers: Able to transfer safely, definite need of hands Standing Unsupported with Eyes Closed: Able to  stand 10 seconds with supervision Standing Ubsupported with Feet Together: Able to place feet together independently and stand for 1 minute with supervision From Standing, Reach Forward with Outstretched Arm: Can reach forward >12 cm safely (5") From Standing Position, Pick up Object from Floor: Able to pick up shoe, needs supervision From Standing Position, Turn to Look Behind Over each Shoulder: Looks behind one side only/other side shows less weight shift Turn 360 Degrees: Needs close supervision or verbal cueing Standing Unsupported, Alternately Place Feet on Step/Stool: Able to stand independently and complete 8 steps >20 seconds Standing Unsupported, One Foot in Front: Able to take small step independently and hold 30 seconds Standing on One Leg: Tries to lift leg/unable to hold 3 seconds but remains standing independently Total Score: 38 Exercises:   Other Treatments:      Therapy/Group: Individual Therapy  Avery Klingbeil  Senta Kantor, PTA  01/11/2018, 12:35 PM

## 2018-01-11 NOTE — Care Management (Signed)
[facility] Individual Statement of Services  Patient Name:  [name]  Date:  01/11/2018  Welcome to the [facility].  Our goal is to provide you with an individualized program based on your diagnosis and situation, designed to meet your specific needs.  With this comprehensive rehabilitation program, you will be expected to participate in at least 3 hours of rehabilitation therapies Monday-Friday, with modified therapy programming on the weekends.  Your rehabilitation program will include the following services:  Physical Therapy (PT), Occupational Therapy (OT), Speech Therapy (ST), 24 hour per day rehabilitation nursing, Therapeutic Recreaction (TR), Neuropsychology, Case Management (Social Worker), Rehabilitation Medicine, Nutrition Services and Pharmacy Services  Weekly team conferences will be held on Tuesdays to discuss your progress.  Your Social Worker will talk with you frequently to get your input and to update you on team discussions.  Team conferences with you and your family in attendance may also be held.  Expected length of stay: 7-9 days   Overall anticipated outcome: supervision  Depending on your progress and recovery, your program may change. Your Social Worker will coordinate services and will keep you informed of any changes. Your Social Worker's name and contact numbers are listed  below.  The following services may also be recommended but are not provided by the [facility] will be made to provide these services after discharge if needed.  Arrangements include referral to agencies that provide these services.  Your insurance has been verified to be:  [employer] Your primary doctor is:  Harwani  Pertinent information will be shared with your doctor and your insurance company.  Social  Worker:  [place], [place] or ([phone]   Information discussed with and copy given to patient by: Lennart Pall, 01/11/2018, 10:51 AM

## 2018-01-11 NOTE — IPOC Note (Signed)
Overall Plan of Care [facility]) Patient Details Name: [name] MRN: [id number] DOB: 1932/03/29  Admitting Diagnosis: Encephalopathy   Hospital Problems: Active Problems:   Acute encephalopathy     Functional Problem List: Nursing Bowel, Nutrition, Safety, Medication Management  PT Balance, Behavior, Motor, Endurance, Nutrition, Pain, Safety, Perception  OT Balance, Cognition, Endurance, Pain, Safety  SLP Cognition, Nutrition  TR         Basic ADL's: OT Grooming, Bathing, Dressing, Toileting     Advanced  ADL's: OT       Transfers: PT Bed Mobility, Bed to Chair, Car, Manufacturing systems engineer, Metallurgist: PT Ambulation, Emergency planning/management officer, Stairs     Additional Impairments: OT None  SLP Swallowing      TR      Anticipated Outcomes Item Anticipated Outcome  Self Feeding n/a  Swallowing  Mod I   Basic self-care  S  Toileting  S   Bathroom Transfers S  Bowel/Bladder  manage bowel with mod I assist  Transfers  supervision  Locomotion  supervision   Communication     Cognition  min assist   Pain  n/a  Safety/Judgment  Maintain safety with cues/supervision   Therapy Plan: PT Intensity: Minimum of 1-2 x/day ,45 to 90 minutes PT Frequency: 5 out of 7 days PT Duration Estimated Length of Stay: 7-9 days OT Intensity: Minimum of 1-2 x/day, 45 to 90 minutes OT Frequency: 5 out of 7 days OT Duration/Estimated Length of Stay: 7-9 days SLP Intensity: Minumum of 1-2 x/day, 30 to 90 minutes SLP Frequency: 3 to 5 out of 7 days SLP Duration/Estimated Length of Stay: 5 days     Team Interventions: Nursing Interventions Patient/Family Education, Bowel Management, Medication Management, Disease Management/Prevention, Discharge Planning  PT interventions Ambulation/gait training, Community reintegration, DME/adaptive equipment instruction, Neuromuscular re-education, Stair training, UE/LE Strength taining/ROM, Wheelchair propulsion/positioning,  Training and development officer, Discharge planning, Functional electrical stimulation, Pain management, Skin care/wound management, Therapeutic Activities, UE/LE Coordination activities, Cognitive remediation/compensation, Functional mobility training, Patient/family education, Disease management/prevention, Therapeutic Exercise, Visual/perceptual remediation/compensation, Psychosocial support  OT Interventions Balance/vestibular training, Self Care/advanced ADL retraining, Therapeutic Exercise, Cognitive remediation/compensation, DME/adaptive equipment instruction, Pain management, UE/LE Strength taining/ROM, Community reintegration, Barrister's clerk education, UE/LE Coordination activities, Discharge planning, Functional mobility training, Psychosocial support, Therapeutic Activities  SLP Interventions Cognitive remediation/compensation, Cueing hierarchy, Dysphagia/aspiration precaution training, Functional tasks, Patient/family education, Internal/external aids  TR Interventions    SW/CM Interventions Discharge Planning, Psychosocial Support, Patient/Family Education   Barriers to Discharge MD  Medical stability and Weight  Nursing      PT      OT Other (comments) none known at this time  SLP      SW       Team Discharge Planning: Destination: PT-Home ,OT- Home , SLP-Home Projected Follow-up: PT-Home health PT, OT-  Home health OT, 24 hour supervision/assistance, SLP-None Projected Equipment Needs: PT-To be determined, OT- To be determined, SLP-None recommended by SLP Equipment Details: PT- , OT-  Patient/family involved in discharge planning: PT- Patient, Family member/caregiver,  OT-Family member/caregiver, SLP-Patient, Family member/caregiver  MD ELOS: 6-9 days. Medical Rehab Prognosis:  Good Assessment: 84 year old right-handed female with history of diabetes mellitus, hypertension, CVA, atrial fibrillation maintained on xarelto as well as small bowel obstruction. Presented 01/01/2018  with slurred speech as well as question seizure. CT/MRI showed no acute intracranial process. 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 Noted blood sugar 596 and blood pressure in the 200s. EEG with no seizure. Findings consistent with encephalopathy. Patient  was loaded with Keppra for seizure prophylaxis. She did require intubation until 01/05/2018.  Repeat MRI reviewed, unremarkable for acute intracranial process. She is currently tolerating a regular consistency diet. She remains on Xarelto has prior to admission. Patient with resulting functional deficits with mobility, cognition, endurance, safety, weakness. Will set goals for Supervision with PT/OT and Min A with SLP.  See Team Conference Notes for weekly updates to the plan of care

## 2018-01-11 NOTE — Progress Notes (Signed)
Occupational Therapy Session Note  Patient Details  Name: [name] MRN: [id number] Date of Birth: 1932/08/18  Today's Date: 01/11/2018 OT Individual Time: 1330-1425 OT Individual Time Calculation (min): 55 min    Short Term Goals: Week 1:  OT Short Term Goal 1 (Week 1): STGs=LTGs secondary to estimated short LOS  Skilled Therapeutic Interventions/Progress Updates:    OT intervention with focus on functional amb with RW, standing balance, activity tolerance, safety awareness, and attention to task.  Pt initially engaged in peg board task while standing in gym with limited distractions.  Pt required min verbal cues to attend to task and min verbal cues to correct errors X 2.  Pt transitioned to [employer] task with average reaction time of 2.94 seconds.  Pt internally distracted during 60 second task and would engaged in conversation about previous occupations.  Pt required min verbal cues to attend to task and redirect.  Pt engaged in functional amb with RW for simple navigation in cluttered environment with min verbal cues for safety.  Pt returned to room with family and friends present.  Pt attempted to stand with safety belt secured.  Reeducated pt regarding safety and not getting up without assistance.  Pt's daughter verbalized understanding.  Pt remained seated in w/c with belt alarm activated and family present.    Therapy Documentation Precautions:  Precautions Precautions: Fall Precaution Comments: seizure Restrictions Weight Bearing Restrictions: No Pain:  Pt denies pain   Therapy/Group: Individual Therapy  Leroy Libman 01/11/2018, 2:37 PM

## 2018-01-11 NOTE — Progress Notes (Signed)
Occupational Therapy Session Note  Patient Details  Name: [name] MRN: [id number] Date of Birth: 06-30-1932  Today's Date: 01/11/2018 OT Individual Time: 8366-2947 OT Individual Time Calculation (min): 75 min    Short Term Goals: Week 1:  OT Short Term Goal 1 (Week 1): STGs=LTGs secondary to estimated short LOS  Skilled Therapeutic Interventions/Progress Updates:    OT intervention with focus on BADL retraining, bed mobility, sit<>stand, functional amb with RW, functional transfers, toileting, activity tolerance, and safety awareness to increase independence with BADLs.  Pt resting in bed upon arrival and sat EOB with supervision.  Pt amb with RW to sink to complete bathing/dressing tasks (see below). Pt requested to use toilet and amb with RW to bathroom.  Pt required min A/CGA for toileting tasks while in standing.  Pt with SOB while standing in bathroom.  Pt requires more than a reasonable amount of time to complete tasks with multiple rest breaks.  Pt required min verbal cues for sequencing during bathing/dressing activities.  Pt remained seated in w/c with all needs within reach, belt alarm activated, and daughter present.  Therapy Documentation Precautions:  Precautions Precautions: Fall Precaution Comments: seizure Restrictions Weight Bearing Restrictions: No Pain: Pain Assessment Pain Scale: 0-10 Pain Score: 0-No pain ADL: ADL Eating: Set up Where Assessed-Eating: Bed level Grooming: Setup Where Assessed-Grooming: Sitting at sink Upper Body Bathing: Supervision/safety Where Assessed-Upper Body Bathing: Sitting at sink Lower Body Bathing: Minimal assistance Where Assessed-Lower Body Bathing: Sitting at sink, Standing at sink Upper Body Dressing: Setup, Supervision/safety Where Assessed-Upper Body Dressing: Sitting at sink Lower Body Dressing: Minimal assistance Where Assessed-Lower Body Dressing: Sitting at sink, Standing at sink Toilet Transfer: Minimal  assistance Toilet Transfer Method: Ambulating Toilet Transfer Equipment: Raised toilet seat   Therapy/Group: Individual Therapy  Leroy Libman 01/11/2018, 9:17 AM

## 2018-01-11 NOTE — Progress Notes (Signed)
Social Work  Social Work Assessment and Plan  Patient Details  Name: [name] MRN: [id number] Date of Birth: 08/13/1932  Today's Date: 01/11/2018  Problem List:  Patient Active Problem List   Diagnosis Date Noted  . Acute encephalopathy 01/09/2018  . PAF (paroxysmal atrial fibrillation) ([place])   . Type 2 diabetes mellitus with peripheral neuropathy (HCC)   . Morbid obesity ([place])   . Status epilepticus ([place]) 01/01/2018  . Accelerated hypertension 01/01/2018  . Acute kidney injury ([place]) 01/01/2018  . Type 2 diabetes mellitus ([place]) 01/01/2018  . Respiratory failure ([place])   . Hyperkalemia 07/25/2017  . Bradycardia 07/25/2017  . Hypotension 07/25/2017  . History of stroke 07/25/2017  . ARF (acute renal failure) ([place]) 07/25/2017  . TIA (transient ischemic attack) 04/08/2016  . Hypocalcemia 04/08/2016  . OBESITY 01/10/2010  . DYSURIA 01/04/2010  . KNEE PAIN, RIGHT 09/05/2009  . HYPERSOMNIA 05/30/2009  . URI 05/11/2009  . SNORING 05/11/2009  . CONTACT DERMATITIS&OTHER ECZEMA DUE UNSPEC CAUSE 11/28/2008  . THROMBOCYTOSIS 08/18/2008  . ANEMIA, IRON DEFICIENCY 08/11/2008  . PERIPHERAL EDEMA 08/10/2008  . POLYP, GALLBLADDER 03/07/2008  . NONSPECIFIC ABN FINDNG RAD&OTH EXAM BILARY TRCT 03/07/2008  . Diabetes mellitus type II, uncontrolled ([place]) 03/01/2008  . Essential hypertension 03/01/2008  . INTERNAL HEMORRHOIDS 03/01/2008  . GERD 03/01/2008  . ANGIOEDEMA 03/01/2008  . CEREBROVASCULAR ACCIDENT, HX OF 03/01/2008  . SMALL BOWEL OBSTRUCTION, HX OF 03/01/2008   Past Medical History:  Past Medical History:  Diagnosis Date  . A-fib [facility]) 2019   Per patients daughter, [name] [phone]  . ANEMIA, IRON DEFICIENCY 08/11/2008  . ANGIOEDEMA 03/01/2008  . CEREBROVASCULAR ACCIDENT, HX OF 03/01/2008  . CONTACT DERMATITIS&OTHER ECZEMA DUE UNSPEC CAUSE 11/28/2008  . DIABETES MELLITUS 03/01/2008  . Dysuria 01/04/2010  . GERD 03/01/2008  . HYPERSOMNIA 05/30/2009  . HYPERTENSION  03/01/2008  . INTERNAL HEMORRHOIDS 03/01/2008  . KNEE PAIN, RIGHT 09/05/2009  . NONSPECIFIC ABN FINDNG RAD&OTH EXAM BILARY TRCT 03/07/2008  . OBESITY 01/10/2010  . PERIPHERAL EDEMA 08/10/2008  . POLYP, GALLBLADDER 03/07/2008  . SMALL BOWEL OBSTRUCTION, HX OF 03/01/2008  . SNORING 05/11/2009  . Stroke ([place]) 04/08/2016   TIA  . THROMBOCYTOSIS 08/18/2008   Past Surgical History:  Past Surgical History:  Procedure Laterality Date  . HERNIA REPAIR  09/14/08   with release [facility]-Dr. Dalbert Batman  . SMALL INTESTINE SURGERY    . 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 She has never used smokeless tobacco. She reports that she does not drink alcohol or use drugs.  Family / Support Systems Marital Status: Divorced How Long?: [phone] Patient Roles: Parent(adult children) Children: daughter, Fradel Baldonado (only local child) @ (C) [phone];  4 other children all living out of state  Anticipated Caregiver: daughter Ability/Limitations of Caregiver: Supervision Caregiver Availability: Intermittent(but will arrange 24/7 if recommended) Family Dynamics: Patient describes all children as very supportive.  Reports that she spends the year traveling to each of their homes to spend time with them.  She does note that [place] is the only local child and is her primary contact.  Social History Preferred language: English Religion: Baptist Cultural Background: N/A Read: Yes Write: Yes Employment Status: Retired Public relations account executive Issues: None Guardian/Conservator: None - per MD, pt is not capable of making decisions on her own behalf.   Abuse/Neglect Abuse/Neglect Assessment Can Be Completed: Yes Physical Abuse: Denies Verbal Abuse:  Denies Sexual Abuse: Denies Exploitation of patient/patient's resources: Denies Self-Neglect: Denies  Emotional Status Pt's affect, behavior and adjustment status: Patient  sitting up in wheelchair with daughter, Andee Poles (here from [place]), sitting at bedside.  Patient is very pleasant and does attempt to answer questions however her answers can become tangential or she may begin recalling her life years ago.  Daughter does attempt to redirect patient and assist with assessment interview.  Patient does not appear in any emotional distress, however, will monitor and referral for neuropsychology as indicated.  May benefit from neuropsychological involvement to further support cognitive evaluation. Recent Psychosocial Issues: None Psychiatric History: None Substance Abuse History: None  Patient / Family Perceptions, Expectations & Goals Pt/Family understanding of illness & functional limitations: Patient reports, "I just went out is all I know and I was out for about 4 days."  Family with good understanding of patient's suspected encephalopathy and current cognitive and functional deficits/need for CIR. Premorbid pt/family roles/activities: Patient lives alone and was completely independent PTA.  Local daughter assist as needed intermittently, however, patient was still driving. Anticipated changes in roles/activities/participation: Per goals of supervision, daughter will assume primary support and likely need to arrange 24/7 care. Pt/family expectations/goals: "I just want to get home but I'm going to my daughter's house first."  [employer]: None Premorbid Home Care/DME Agencies: None Transportation available at discharge: Yes Resource referrals recommended: Neuropsychology  Discharge Planning Living Arrangements: Alone Support Systems: Children Type of Residence: Private residence Insurance Resources: Medicare([employer] Medicare) Financial Resources: Radio broadcast assistant Screen Referred: No Living Expenses: Rent Money Management: Patient, Family Home Management: Patient Patient/Family Preliminary Plans: Anticipate patient  will discharge to daughter's home initially. Social Work Anticipated Follow Up Needs: HH/OP Expected length of stay: 5 to 9 days  Clinical Impression Very pleasant, elderly woman here for encephalopathy and team anticipating relatively short LOS.  Patient has very supportive family, however, only 1 daughter lives locally and she does work.  Family anticipating need for 24/7 supervision initially and will arrange this.  Patient denies any significant emotional distress, however, will monitor throughout CIR stay.  Will follow for discharge planning and support needs.  Aalivia Mcgraw 01/11/2018, 10:48 AM

## 2018-01-12 ENCOUNTER — Inpatient Hospital Stay (HOSPITAL_COMMUNITY): Payer: Medicare HMO

## 2018-01-12 ENCOUNTER — Inpatient Hospital Stay (HOSPITAL_COMMUNITY): Payer: Medicare HMO | Admitting: Physical Therapy

## 2018-01-12 DIAGNOSIS — E1165 Type 2 diabetes mellitus with hyperglycemia: Secondary | ICD-10-CM

## 2018-01-12 DIAGNOSIS — D62 Acute posthemorrhagic anemia: Secondary | ICD-10-CM

## 2018-01-12 DIAGNOSIS — N183 Chronic kidney disease, stage 3 unspecified: Secondary | ICD-10-CM

## 2018-01-12 DIAGNOSIS — E46 Unspecified protein-calorie malnutrition: Secondary | ICD-10-CM

## 2018-01-12 DIAGNOSIS — R7309 Other abnormal glucose: Secondary | ICD-10-CM

## 2018-01-12 DIAGNOSIS — N179 Acute kidney failure, unspecified: Secondary | ICD-10-CM

## 2018-01-12 DIAGNOSIS — I10 Essential (primary) hypertension: Secondary | ICD-10-CM

## 2018-01-12 DIAGNOSIS — E1142 Type 2 diabetes mellitus with diabetic polyneuropathy: Secondary | ICD-10-CM

## 2018-01-12 DIAGNOSIS — R0989 Other specified symptoms and signs involving the circulatory and respiratory systems: Secondary | ICD-10-CM

## 2018-01-12 DIAGNOSIS — E162 Hypoglycemia, unspecified: Secondary | ICD-10-CM

## 2018-01-12 DIAGNOSIS — D72829 Elevated white blood cell count, unspecified: Secondary | ICD-10-CM

## 2018-01-12 LAB — COMPREHENSIVE METABOLIC PANEL
ALT: 17 U/L (ref 0–44)
AST: 16 U/L (ref 15–41)
Albumin: 2.8 g/dL — ABNORMAL LOW (ref 3.5–5.0)
Alkaline Phosphatase: 52 U/L (ref 38–126)
Anion gap: 8 (ref 5–15)
BUN: 30 mg/dL — ABNORMAL HIGH (ref 8–23)
CO2: 27 mmol/L (ref 22–32)
Calcium: 9.2 mg/dL (ref 8.9–10.3)
Chloride: 104 mmol/L (ref 98–111)
Creatinine, Ser: 1.44 mg/dL — ABNORMAL HIGH (ref 0.44–1.00)
GFR calc non Af Amer: 33 mL/min — ABNORMAL LOW (ref >60)
GFR, EST AFRICAN AMERICAN: 39 mL/min — AB (ref >60)
Glucose, Bld: 71 mg/dL (ref 70–99)
Potassium: 4.1 mmol/L (ref 3.5–5.1)
Sodium: 139 mmol/L (ref 135–145)
Total Bilirubin: 0.5 mg/dL (ref 0.3–1.2)
Total Protein: 6.3 g/dL — ABNORMAL LOW (ref 6.5–8.1)

## 2018-01-12 LAB — GLUCOSE, CAPILLARY
GLUCOSE-CAPILLARY: 66 mg/dL — AB (ref 70–99)
GLUCOSE-CAPILLARY: 94 mg/dL (ref 70–99)
GLUCOSE-CAPILLARY: 149 mg/dL — AB (ref 70–99)
Glucose-Capillary: 66 mg/dL — ABNORMAL LOW (ref 70–99)
Glucose-Capillary: 94 mg/dL (ref 70–99)
Glucose-Capillary: 163 mg/dL — ABNORMAL HIGH (ref 70–99)

## 2018-01-12 LAB — CBC WITH DIFFERENTIAL/PLATELET
Abs Immature Granulocytes: 0.08 10*3/uL — ABNORMAL HIGH (ref 0.00–0.07)
Basophils Absolute: 0.1 10*3/uL (ref 0.0–0.1)
Basophils Relative: 1 %
EOS ABS: 0.5 10*3/uL (ref 0.0–0.5)
Eosinophils Relative: 5 %
HCT: 35.2 % — ABNORMAL LOW (ref 36.0–46.0)
Hemoglobin: 11.2 g/dL — ABNORMAL LOW (ref 12.0–15.0)
Immature Granulocytes: 1 %
Lymphocytes Relative: 37 %
Lymphs Abs: 3.9 10*3/uL (ref 0.7–4.0)
MCH: 26.4 pg (ref 26.0–34.0)
MCHC: 31.8 g/dL (ref 30.0–36.0)
MCV: 83 fL (ref 80.0–100.0)
Monocytes Absolute: 1.4 10*3/uL — ABNORMAL HIGH (ref 0.1–1.0)
Monocytes Relative: 13 %
Neutro Abs: 4.8 10*3/uL (ref 1.7–7.7)
Neutrophils Relative %: 43 %
PLATELETS: 468 10*3/uL — AB (ref 150–400)
RBC: 4.24 MIL/uL (ref 3.87–5.11)
RDW: 16 % — AB (ref 11.5–15.5)
WBC: 10.7 10*3/uL — AB (ref 4.0–10.5)
nRBC: 0 % (ref 0.0–0.2)

## 2018-01-12 MED ORDER — RIVAROXABAN 15 MG PO TABS: 15.0000 mg | ORAL_TABLET | Freq: Every day | ORAL | 3 refills | Status: AC

## 2018-01-12 MED ORDER — LEVETIRACETAM 500 MG PO TABS: 500.0000 mg | ORAL_TABLET | Freq: Two times a day (BID) | ORAL | 1 refills | Status: DC

## 2018-01-12 MED ORDER — DILTIAZEM HCL 30 MG PO TABS: 30.0000 mg | ORAL_TABLET | Freq: Three times a day (TID) | ORAL | 1 refills | Status: AC

## 2018-01-12 MED ORDER — CLONIDINE HCL 0.2 MG PO TABS: 0.2000 mg | ORAL_TABLET | Freq: Two times a day (BID) | ORAL | 3 refills | Status: AC

## 2018-01-12 MED ORDER — DILTIAZEM HCL 30 MG PO TABS
30.0000 mg | ORAL_TABLET | Freq: Three times a day (TID) | ORAL | Status: DC
  Administered 2018-01-12 – 2018-01-13 (×3): 30 mg via ORAL
  Filled 2018-01-12 (×3): qty 1

## 2018-01-12 MED ORDER — CYANOCOBALAMIN 500 MCG PO TABS: 500.0000 ug | ORAL_TABLET | Freq: Every day | ORAL | 0 refills | Status: AC

## 2018-01-12 MED ORDER — INSULIN DETEMIR 100 UNIT/ML FLEXPEN: 16.0000 [IU] | PEN_INJECTOR | Freq: Two times a day (BID) | SUBCUTANEOUS | 11 refills | Status: AC

## 2018-01-12 MED ORDER — PRO-STAT SUGAR FREE PO LIQD
30.0000 mL | Freq: Two times a day (BID) | ORAL | Status: DC
  Administered 2018-01-12 – 2018-01-13 (×3): 30 mL via ORAL
  Filled 2018-01-12 (×3): qty 30

## 2018-01-12 MED ORDER — PANTOPRAZOLE SODIUM 40 MG PO TBEC: 40.0000 mg | DELAYED_RELEASE_TABLET | Freq: Every day | ORAL | 0 refills | Status: AC

## 2018-01-12 MED ORDER — ACETAMINOPHEN 325 MG PO TABS: 650.0000 mg | ORAL_TABLET | Freq: Four times a day (QID) | ORAL | Status: AC | PRN

## 2018-01-12 NOTE — Discharge Summary (Signed)
Discharge summary job # [id number]

## 2018-01-12 NOTE — Progress Notes (Addendum)
Physical Therapy Session Note  Patient Details  Name: [name] MRN: [id number] Date of Birth: March 21, 1932  Today's Date: 01/12/2018 PT Individual Time:  - 1030-1058 28 minutes     Short Term Goals: Week 1:  PT Short Term Goal 1 (Week 1): STG=LTG due to ELOS  Skilled Therapeutic Interventions/Progress Updates:    Pt performs transfers and gait with RW throughout unit with RW and supervision, no LOB.  Gait without AD for balance training with CGA 100' x 2.  nustep x 6 minutes for UE/LE strength and endurance, pt educated on importance of continues physical activity at home.  Pt left in room with needs at hand, nursing present  Therapy Documentation Precautions:  Precautions Precautions: Fall Precaution Comments: seizure Restrictions Weight Bearing Restrictions: No Pain: Pain Assessment Pain Scale: 0-10 Pain Score: 0-No pain    Therapy/Group: Individual Therapy  Ravinder Lukehart 01/12/2018, 11:00 AM

## 2018-01-12 NOTE — Progress Notes (Signed)
Physical Therapy Session Note  Patient Details  Name: [name] MRN: [id number] Date of Birth: [birth year]  Today's Date: 01/12/2018 PT Individual Time: 1455-1610 PT Individual Time Calculation (min): 75 min   Short Term Goals: Week 1:  PT Short Term Goal 1 (Week 1): STG=LTG due to ELOS  Skilled Therapeutic Interventions/Progress Updates:   Pt sitting up in w/c.  dtr Danielle in attendance.  Gait training with RW on level tile x 80' with multiple turns, supervision.  Trial of Rollator walker on same route, with supervision.  Pt never remembered brakes of Rollator.  She would be safer with RW for now.  neuromuscular re-education via forced use, multimodal cues for sustained stretch bil heel cords and hamstrings, and balance challenge, standing with forefeet on wedge, iwht bil UE support fading to 0UE support, x 1 minute x 2.  Pt had minimal A-P sway but no LOB.  Balance retraining: given external perturbations, pt demonstrated minimal bil ankle strategies, absent hip and stepping strategies.  With practice, pt improved in recruiting hip strategies bil.  Standing with bil UE support, 12 x 1 each bil heel raises, R/L hip abduction, toe raises, mini squats  Gait training with RW up/down ramp with supervision, no LOB.  Advanced gait x 50' with RW, kicking Yoga block with either foot, mild LOB regained independently with RW. Cues to flex R/L knee for kicking rather than "nudging" block, with good results..  Sit>< stand without use of UEs, x 8 throughout session, for strengthening bil LEs and core.    Pt left resting in w/c with dtr attending her, needs at hand.  Dtr stated that she would don seat belt alarm.  Andee Poles is checked off for gait with pt in room. Therapy Documentation Precautions:  Precautions Precautions: Fall Precaution Comments: seizure Restrictions Weight Bearing Restrictions: No  Pain: pt denies     Therapy/Group: Individual Therapy  Deloris Mittag 01/12/2018,  4:19 PM

## 2018-01-12 NOTE — Progress Notes (Signed)
Speech Language Pathology Daily Session Note  Patient Details  Name: ZENNA TRAISTER MRN: [id number] Date of Birth: 1932-10-22  Today's Date: 01/12/2018 SLP Individual Time: 1400-1430 SLP Individual Time Calculation (min): 30 min  Short Term Goals: Week 1: SLP Short Term Goal 1 (Week 1): STG=LTG due to ELOS  Skilled Therapeutic Interventions:Skilled ST services focused on cognitive skills. SLP facilitated basic problem solving of novel card task, Blink, pt required min-supervision A cues played at most basic level. Pt required min A verbal cues for selective attention in a very distracting, daughter and family members talking loudly in room.  Pt was left in room with call bell within reach and chair alarm set. Recommend to continue skilled ST services.      Pain Pain Assessment Pain Score: 0-No pain  Therapy/Group: Individual Therapy  Edrik Rundle  [facility] 01/12/2018, 4:49 PM

## 2018-01-12 NOTE — Progress Notes (Addendum)
Inpatient Diabetes Program Recommendations  AACE/ADA: New Consensus Statement on Inpatient Glycemic Control (2015)  Target Ranges:  Prepandial:   less than 140 mg/dL      Peak postprandial:   less than 180 mg/dL (1-2 hours)      Critically ill patients:  140 - 180 mg/dL   Lab Results  Component Value Date   GLUCAP 94 01/12/2018   HGBA1C 13.0 (H) 01/01/2018    Review of Glycemic Control Results for Joan, [name] (MRN [id number]) as of 01/12/2018 10:30  Ref. Range 01/12/2018 05:37 01/12/2018 06:11 01/12/2018 06:41  Glucose-Capillary Latest Ref Range: 70 - 99 mg/dL 66 (L) 66 (L) 94  Results for Joan, JIGGETTS (MRN [id number]) as of 01/12/2018 10:30  Ref. Range 01/11/2018 06:21 01/11/2018 12:20 01/11/2018 16:28 01/11/2018 20:53  Glucose-Capillary Latest Ref Range: 70 - 99 mg/dL 111 (H) 317 (H) 169 (H) 185 (H)   Diabetes history:DM 2 Metformin 500 mg daily, Levemir 25 units in the AM and 18 units in the PM Current orders for Inpatient glycemic control:Levemir 16 units BID, Novolog 0-9 units TID  Inpatient Diabetes Program Recommendations:    Consider decreasing Levemir to 14 units QHS and continue with Levemir 16 units QD.   Additionally, consider either switching nutritional supplement OR covering the carbohydrates associated (Ensure has 34 g). Patient has pattern of elevated post prandial (around noon). Consider Novolog 4 units QAM (with nutritional supplement)   Thanks, Bronson Curb, MSN, RNC-OB Diabetes Coordinator [phone] (8a-5p)

## 2018-01-12 NOTE — Progress Notes (Signed)
Occupational Therapy Session Note  Patient Details  Name: [name] MRN: [id number] Date of Birth: January 06, 1933  Today's Date: 01/12/2018 OT Individual Time: 1624-4695 OT Individual Time Calculation (min): 57 min    Today's Date: 01/12/2018  Short Term Goals: Week 1:  OT Short Term Goal 1 (Week 1): STGs=LTGs secondary to estimated short LOS  Skilled Therapeutic Interventions/Progress Updates:    Session focused on b/d tasks at shower level. Pt received sitting up EOB eating breakfast with family present. Pt completed functional mobility with RW with CGA into bathroom. Pt demonstrated good safety awareness in shower, asking for assistance when standing. Pt completed sit <> stand with (S) and completed full UB/LB bathing. Pt washed hair with set up and cueing. Pt returned to EOB and donned LB clothing with CGA during standing. UB clothing donned with min A d/t shirt being tight and to fasten bra posteriorly. Pt's daughter assisted pt in blow drying hair EOB. Pt completed oral care at sink sitting in w/c with set up. Pt was left sitting up in w/c with all needs met.   Therapy Documentation Precautions:  Precautions Precautions: Fall Precaution Comments: seizure Restrictions Weight Bearing Restrictions: No    Vital Signs: Therapy Vitals Temp: 98 F (36.7 C) Temp Source: Oral Pulse Rate: 87 Resp: 18 BP: 120/60 Patient Position (if appropriate): Lying Oxygen Therapy SpO2: 100 % O2 Device: Room Air Pain: Pain Assessment Pain Scale: 0-10 Pain Score: 0-No pain  Therapy/Group: Individual Therapy  Curtis Sites 01/12/2018, 9:40 AM

## 2018-01-12 NOTE — Progress Notes (Signed)
Patients family and visitors have been informed multiple times that the staff needs to assist patients with transport. Daughter continues to try to transfer patient before notifying staff.  Nicholes Rough, LPN

## 2018-01-12 NOTE — Progress Notes (Signed)
[place] PHYSICAL MEDICINE & REHABILITATION PROGRESS NOTE  Subjective/Complaints: Patient seen sitting up at the edge of the bed this morning.  Family at bedside.  She states she slept well overnight.  She states she had a good weekend.  She was nursing home.  ROS: Denies CP, shortness of breath, nausea, vomiting, diarrhea.  Objective: Vital Signs: Blood pressure 120/60, pulse 87, temperature 98 F (36.7 C), temperature source Oral, resp. rate 18, height 5\' 1"  (1.549 m), weight 86.2 kg, SpO2 100 %. No results found. Recent Labs    01/12/18 0549  WBC 10.7*  HGB 11.2*  HCT 35.2*  PLT 468*   Recent Labs    01/12/18 0549  NA 139  K 4.1  CL 104  CO2 27  GLUCOSE 71  BUN 30*  CREATININE 1.44*  CALCIUM 9.2    Physical Exam: BP 120/60 (BP Location: Right Arm)   Pulse 87   Temp 98 F (36.7 C) (Oral)   Resp 18   Ht 5\' 1"  (1.549 m)   Wt 86.2 kg   SpO2 100%   BMI 35.91 kg/m  Constitutional: She appears well-developed.  Obese. HENT: Normocephalic and atraumatic.  Eyes: EOMI.  No discharge. Cardiovascular: Normal rate and regular rhythm.  No JVD. Respiratory: Effort normal.  Clear. GI: Bowel sounds are normal. She exhibits no distension.  Musculoskeletal: No edema or tenderness in extremities  Neurological:  Alert and oriented Motor: 4/5 throughout Skin: Skin is warm and dry.  Psychiatric: Atypical affect  Assessment/Plan: 1. Functional deficits secondary to encephalopathy which require 3+ hours per day of interdisciplinary therapy in a comprehensive inpatient rehab setting.  Physiatrist is providing close team supervision and 24 hour management of active medical problems listed below.  Physiatrist and rehab team continue to assess barriers to discharge/monitor patient progress toward functional and medical goals  Care Tool:  Bathing  Bathing activity did not occur: Refused Body parts bathed by patient: Right arm, Left arm, Chest, Abdomen, Front perineal area,  Buttocks, Right upper leg, Left upper leg, Right lower leg, Left lower leg, Face         Bathing assist Assist Level: Supervision/Verbal cueing     Upper Body Dressing/Undressing Upper body dressing   What is the patient wearing?: Pull over shirt    Upper body assist Assist Level: Minimal Assistance - Patient > 75%    Lower Body Dressing/Undressing Lower body dressing      What is the patient wearing?: Underwear/pull up, Pants     Lower body assist Assist for lower body dressing: Contact Guard/Touching assist     Toileting Toileting    Toileting assist Assist for toileting: Minimal Assistance - Patient > 75%     Transfers Chair/bed transfer  Transfers assist  Chair/bed transfer activity did not occur: Refused  Chair/bed transfer assist level: Contact Guard/Touching assist     Locomotion Ambulation   Ambulation assist      Assist level: Contact Guard/Touching assist Assistive device: Walker-rolling Max distance: 175ft   Walk 10 feet activity   Assist     Assist level: Contact Guard/Touching assist Assistive device: Walker-rolling   Walk 50 feet activity   Assist    Assist level: Contact Guard/Touching assist Assistive device: Walker-rolling    Walk 150 feet activity   Assist    Assist level: Minimal Assistance - Patient > 75% Assistive device: Walker-rolling    Walk 10 feet on uneven surface  activity   Assist Walk 10 feet on uneven surfaces activity did  not occur: Safety/medical concerns         Wheelchair     Assist Will patient use wheelchair at discharge?: No             Wheelchair 50 feet with 2 turns activity    Assist            Wheelchair 150 feet activity     Assist            Medical Problem List and Plan: 1.   Debility secondary to suspect seizure/encephalopathy. EEG negative  Continue CIR   Weekend notes reviewed 2.  DVT Prophylaxis/Anticoagulation: Xarelto. Monitor for any  signs of DVT 3. Pain Management:  Tylenol as needed 4. Mood:  Provide emotional support 5. Neuropsych: This patient is not capable of making decisions on her own behalf. 6. Skin/Wound Care: Routine skin checks 7. Fluids/Electrolytes/Nutrition:  Routine and I/Os 8. Atrial fibrillation. Cardiac rate controlled. Continue Xarelto 9. Seizure prophylaxis. Keppra 500 mg twice a day. EEG negative 10. Hypertension. Clonidine 0.2 mg twice a day, Cardizem 30 mg every 8 hours  Labile on 12/23 11.Diabetes Mellitus with peripheral neuropathy. Hemoglobin A1c 13. Diabetes mellitus: Levemir 16 units twice a day. Check blood sugars before meals and at bedtime. Diabetic teaching  Labile with hypoglycemia on 12/23 12.  Morbid obesity: Encouraged weight loss 13.  Leukocytosis  WBCs 10.7 on 12/23  Afebrile  Continue to monitor 14.  Acute blood loss anemia  Hemoglobin 11.2 on 12/23  Continue to monitor 15.?  AKI on CKD  Creatinine 1.44 on 12/23  Encourage fluids 16.  Hypoalbuminemia  Supplement initiated on 12/23  LOS: 3 days A FACE TO FACE EVALUATION WAS PERFORMED  Ginnifer Creelman Lorie Phenix 01/12/2018, 10:27 AM

## 2018-01-12 NOTE — Progress Notes (Signed)
Patient information reviewed and entered into eRehab system by Christyana Corwin, RN, CRRN, PPS Coordinator.  Information including medical coding, functional ability and quality indicators will be reviewed and updated through discharge.     Per nursing patient was given "Data Collection Information Summary for Patients in Inpatient Rehabilitation Facilities with attached "Privacy Act Statement-Health Care Records" upon admission.  

## 2018-01-12 NOTE — Discharge Summary (Signed)
NAMEKINDLE, STROHMEIER MEDICAL RECORD [id number] ACCOUNT [id number] DATE OF BIRTH:Feb 21, 1932 FACILITY: [facility] LOCATION: [facility] PHYSICIAN:ZACHARY SWARTZ, MD  DISCHARGE SUMMARY  DATE OF DISCHARGE:  01/13/2018  DISCHARGE DIAGNOSES: 1.  Seizure with encephalopathy.  2.  Thrombosis prophylaxis with Xarelto. 3.  Seizure prophylaxis. 4.  Hypertension.   5.  Morbid obesity.  HISTORY OF PRESENT ILLNESS:  This is an 84 year old right-handed female with history of diabetes mellitus, hypertension, CVA, atrial fibrillation, maintained on Xarelto as well as small-bowel obstruction.  She lives alone, independent prior to admission.   Plans to stay with her daughter on discharge.  Presented 01/01/2018 with slurred speech, questionable seizure.  CT MRI showed no acute intracranial process.  Multiple cerebellar infarctions noted.  Noted blood sugar 596.  Blood pressure in the 200s.   EEG negative for seizure.  Findings consistent with encephalopathy.  She was loaded with Keppra.  No further seizure activity.  She did require intubation till 01/05/2018.  Repeat MRI reviewed.  Unremarkable for acute process.  Therapy evaluations  completed.  She remained on Xarelto as prior to admission.  She was admitted for comprehensive rehabilitation program.  PAST MEDICAL HISTORY:  See discharge diagnoses.  SOCIAL HISTORY:  Lives alone.  Plans to have assistance from her daughter.  Independent prior to admission.  FUNCTIONAL STATUS:  Upon admission to rehab services was minimal assist 60 feet rolling walker, minimal assist sit to stand, min mod assist for ADLs.  PHYSICAL EXAMINATION: VITAL SIGNS:  Blood pressure 118/61, pulse 93, temperature 97, respirations 18. GENERAL:  Alert female in no acute distress. HEENT:  EOMs intact. NECK:  Supple, nontender, no JVD. CARDIOVASCULAR:  Rate controlled. ABDOMEN:  Soft, nontender, good bowel sounds. LUNGS:  Clear to auscultation without wheeze.  REHABILITATION HOSPITAL  COURSE:  The patient was admitted to inpatient rehabilitation services.  Therapies initiated on a 3-hour daily basis, consisting of physical therapy, occupational therapy, speech therapy and rehabilitation nursing.  The following  issues were addressed during patient's rehabilitation stay:  Pertaining to the seizure encephalopathy.  EEG negative.  She remained on Keppra.  No further seizure activity.    Chronic Xarelto for DVT prophylaxis as well as history of atrial fibrillation.  No chest pain or shortness of breath.  Blood pressure is controlled with clonidine.    Blood sugars monitored.  Hemoglobin A1c of 13.  She remained on Levemir but Glucophage was held due to mild elevation in creatinine 1.44.   The patient received weekly collaborative interdisciplinary team conferences to discuss estimated length of stay, family teaching, any barriers to discharge.  She scored 38/56 on Berg.  Working with energy conservation techniques.  Ambulating 120 feet  rolling walker to the day room.  Gathers belongings for activities of daily living and homemaking.  Ongoing monitoring for her safety.  Reeducated family again regarding safety and daughter can provide assistance.  DISCHARGE MEDICATIONS:  At time of dictation, included Cardizem 30 mg every 8 hours, clonidine 0.2 mg p.o. b.i.d., Levemir 16 units b.i.d., Keppra 500 mg b.i.d., Protonix 40 mg daily, Xarelto 15 mg daily.  DIET:  Diabetic diet.    FOLLOWUP:  She would follow up outpatient as needed with Dr. Naaman Plummer.  Follow up with primary MD, Dr. Terrence Dupont, medical management.  AN/NUANCE D:01/12/2018 T:01/12/2018 JOB:[id number]

## 2018-01-13 ENCOUNTER — Inpatient Hospital Stay (HOSPITAL_COMMUNITY): Payer: Medicare HMO | Admitting: Occupational Therapy

## 2018-01-13 ENCOUNTER — Inpatient Hospital Stay (HOSPITAL_COMMUNITY): Payer: Medicare HMO

## 2018-01-13 ENCOUNTER — Inpatient Hospital Stay (HOSPITAL_COMMUNITY): Payer: Medicare HMO | Admitting: Speech Pathology

## 2018-01-13 LAB — GLUCOSE, CAPILLARY
Glucose-Capillary: 121 mg/dL — ABNORMAL HIGH (ref 70–99)
Glucose-Capillary: 312 mg/dL — ABNORMAL HIGH (ref 70–99)

## 2018-01-13 NOTE — Progress Notes (Signed)
Social Work  Discharge Note  The overall goal for the admission was met for:   Discharge location: Yes - home with local daughter  Length of Stay: Yes - 4 days  Discharge activity level: Yes - supervision overall  Home/community participation: Yes  Services provided included: MD, RD, PT, OT, SLP, RN, TR, Pharmacy and [place] [employer]  Follow-up services arranged: Home Health: PT, OT, ST via [facility], DME: rolling walker, tub bench via [facility] and Patient/Family has no preference for HH/DME agencies  Comments (or additional information):  Patient/Family verbalized understanding of follow-up arrangements: Yes  Individual responsible for coordination of the follow-up plan: pt/ daughter  Confirmed correct DME delivered: Lennart Pall 01/13/2018    Winnifred Dufford

## 2018-01-13 NOTE — Progress Notes (Signed)
[facility] PHYSICAL MEDICINE & REHABILITATION PROGRESS NOTE  Subjective/Complaints: Patient seen earlier this morning.  Family at bedside.  Patient states she slept well overnight, confirmed by family.  She is ready for discharge.  ROS: Denies CP, shortness of breath, nausea, vomiting, diarrhea.  Objective: Vital Signs: Blood pressure (!) 137/55, pulse 72, temperature 98 F (36.7 C), temperature source Oral, resp. rate 18, height 5\' 1"  (1.549 m), weight 86.3 kg, SpO2 97 %. No results found. Recent Labs    01/12/18 0549  WBC 10.7*  HGB 11.2*  HCT 35.2*  PLT 468*   Recent Labs    01/12/18 0549  NA 139  K 4.1  CL 104  CO2 27  GLUCOSE 71  BUN 30*  CREATININE 1.44*  CALCIUM 9.2    Physical Exam: BP (!) 137/55 (BP Location: Left Arm)   Pulse 72   Temp 98 F (36.7 C) (Oral)   Resp 18   Ht 5\' 1"  (1.549 m)   Wt 86.3 kg   SpO2 97%   BMI 35.96 kg/m  Constitutional: She appears well-developed.  Obese. HENT: Normocephalic and atraumatic.  Eyes: EOMI.  No discharge. Cardiovascular: RRR.  No JVD. Respiratory: Effort normal.  Clear. GI: Bowel sounds are normal. She exhibits no distension.  Musculoskeletal: No edema or tenderness in extremities  Neurological:  Alert and oriented Motor: 4/5 throughout, unchanged Skin: Skin is warm and dry.  Psychiatric: Atypical affect  Assessment/Plan: 1. Functional deficits secondary to encephalopathy which require 3+ hours per day of interdisciplinary therapy in a comprehensive inpatient rehab setting.  Physiatrist is providing close team supervision and 24 hour management of active medical problems listed below.  Physiatrist and rehab team continue to assess barriers to discharge/monitor patient progress toward functional and medical goals  Care Tool:  Bathing  Bathing activity did not occur: Refused Body parts bathed by patient: Right arm, Left arm, Chest, Abdomen, Front perineal area, Buttocks, Right upper leg, Left upper leg,  Right lower leg, Left lower leg, Face         Bathing assist Assist Level: Supervision/Verbal cueing     Upper Body Dressing/Undressing Upper body dressing   What is the patient wearing?: Pull over shirt    Upper body assist Assist Level: Minimal Assistance - Patient > 75%    Lower Body Dressing/Undressing Lower body dressing      What is the patient wearing?: Underwear/pull up, Pants     Lower body assist Assist for lower body dressing: Contact Guard/Touching assist     Toileting Toileting    Toileting assist Assist for toileting: Minimal Assistance - Patient > 75%     Transfers Chair/bed transfer  Transfers assist  Chair/bed transfer activity did not occur: Refused  Chair/bed transfer assist level: Minimal Assistance - Patient > 75%     Locomotion Ambulation   Ambulation assist      Assist level: Contact Guard/Touching assist Assistive device: Walker-rolling Max distance: 168ft   Walk 10 feet activity   Assist     Assist level: Contact Guard/Touching assist Assistive device: Walker-rolling   Walk 50 feet activity   Assist    Assist level: Contact Guard/Touching assist Assistive device: Walker-rolling    Walk 150 feet activity   Assist    Assist level: Minimal Assistance - Patient > 75% Assistive device: Walker-rolling    Walk 10 feet on uneven surface  activity   Assist Walk 10 feet on uneven surfaces activity did not occur: Safety/medical concerns  Wheelchair     Assist Will patient use wheelchair at discharge?: No             Wheelchair 50 feet with 2 turns activity    Assist            Wheelchair 150 feet activity     Assist            Medical Problem List and Plan: 1.   Debility secondary to suspect seizure/encephalopathy. EEG negative  DC today  Patient to follow-up for transitional care management in 1-2 weeks post-discharge 2.  DVT Prophylaxis/Anticoagulation: Xarelto.  Monitor for any signs of DVT 3. Pain Management:  Tylenol as needed 4. Mood:  Provide emotional support 5. Neuropsych: This patient is not capable of making decisions on her own behalf. 6. Skin/Wound Care: Routine skin checks 7. Fluids/Electrolytes/Nutrition:  Routine and I/Os 8. Atrial fibrillation. Cardiac rate controlled. Continue Xarelto 9. Seizure prophylaxis. Keppra 500 mg twice a day. EEG negative 10. Hypertension. Clonidine 0.2 mg twice a day, Cardizem 30 mg every 8 hours  Labile on 12/24, but improving 11.Diabetes Mellitus with peripheral neuropathy. Hemoglobin A1c 13. Diabetes mellitus: Levemir 16 units twice a day. Check blood sugars before meals and at bedtime. Diabetic teaching  Labile on 12/24 but stabilizing 12.  Morbid obesity: Encouraged weight loss 13.  Leukocytosis  WBCs 10.7 on 12/23  Afebrile  Continue to monitor 14.  Acute blood loss anemia  Hemoglobin 11.2 on 12/23  Continue to monitor 15.?  AKI on CKD  Creatinine 1.44 on 12/23  Encourage fluids 16.  Hypoalbuminemia  Supplement initiated on 12/23  LOS: 4 days A FACE TO FACE EVALUATION WAS PERFORMED  Stephannie Broner Lorie Phenix 01/13/2018, 8:50 AM

## 2018-01-13 NOTE — Progress Notes (Signed)
Occupational Therapy Discharge Summary  Patient Details  Name: PATRECE TALLIE MRN: [id number] Date of Birth: [birth year]  Today's Date: 01/13/2018 OT Individual Time: 0830-0900 OT Individual Time Calculation (min): 30 min   OT treatment session focused on increased independence with BADL tasks. Pt able to access dresser drawers, collect clothing, ambulate to bathroom with RW all without assist from OT. Bathing/dressing completed supervision/mod I with increased time. Discussed dc plan and DME needs with daughter-she verbalized understanding. See below for further details.   Patient has met 11 of 11 long term goals due to improved activity tolerance, improved balance, postural control, ability to compensate for deficits and improved attention.  Patient to discharge at overall Supervision level.  Patient's care partner is independent to provide the necessary physical assistance at discharge for higher level iADL tasks.    Reasons goals not met: n/a  Recommendation:  Patient will benefit from ongoing skilled OT services in home health setting to continue to advance functional skills in the area of BADL.  Equipment: RW, tub transfer bench, 3-in-1 BSC  Reasons for discharge: treatment goals met and discharge from hospital  Patient/family agrees with progress made and goals achieved: Yes  OT Discharge Precautions/Restrictions  Precautions Precautions: Fall Restrictions Weight Bearing Restrictions: No Pain Pain Assessment Pain Scale: 0-10 Pain Score: 0-No pain ADL ADL Eating: Independent Where Assessed-Eating: Bed level Grooming: Independent Where Assessed-Grooming: Sitting at sink Upper Body Bathing: Independent Where Assessed-Upper Body Bathing: Sitting at sink Lower Body Bathing: Supervision/safety Where Assessed-Lower Body Bathing: Sitting at sink, Standing at sink Upper Body Dressing: Independent Where Assessed-Upper Body Dressing: Sitting at sink Lower Body Dressing:  Independent Where Assessed-Lower Body Dressing: Edge of bed Toileting: Supervision/safety Toilet Transfer: Distant supervision Toilet Transfer Method: Counselling psychologist: Raised toilet seat Tub/Shower Transfer: Distant supervision Tub/Shower Transfer Method: Optometrist: Tourist information centre manager Overall Cognitive Status: Within Functional Limits for tasks assessed Arousal/Alertness: Awake/alert Orientation Level: Oriented X4 Attention: Selective Selective Attention: Appears intact Memory: Appears intact Awareness: Appears intact Problem Solving: Appears intact Sensation Sensation Light Touch: Appears Intact Proprioception: Appears Intact Coordination Gross Motor Movements are Fluid and Coordinated: Yes Fine Motor Movements are Fluid and Coordinated: Yes Coordination and Movement Description: improved since eval Motor  Motor Motor - Discharge Observations: improved overall strength and activity tolerance Mobility  Transfers Sit to Stand: Independent with assistive device  Balance Balance Dynamic Sitting Balance Dynamic Sitting - Level of Assistance: 7: Independent Static Standing Balance Static Standing - Level of Assistance: 6: Modified independent (Device/Increase time) Dynamic Standing Balance Dynamic Standing - Level of Assistance: 6: Modified independent (Device/Increase time) Extremity/Trunk Assessment RUE Assessment RUE Assessment: Within Functional Limits LUE Assessment LUE Assessment: Within Functional Limits   Daneen Schick Tevon Berhane 01/13/2018, 10:27 AM

## 2018-01-13 NOTE — Plan of Care (Signed)
  Problem: RH Balance Goal: LTG: Patient will maintain dynamic sitting balance (OT) Description LTG:  Patient will maintain dynamic sitting balance with assistance during activities of daily living (OT) Outcome: Completed/Met Goal: LTG Patient will maintain dynamic standing with ADLs (OT) Description LTG:  Patient will maintain dynamic standing balance with assist during activities of daily living (OT)  Outcome: Completed/Met   Problem: Sit to Stand Goal: LTG:  Patient will perform sit to stand in prep for activites of daily living with assistance level (OT) Description LTG:  Patient will perform sit to stand in prep for activites of daily living with assistance level (OT) Outcome: Completed/Met   Problem: RH Attention Goal: LTG Patient will demonstrate this level of attention during functional activites (OT) Description LTG:  Patient will demonstrate this level of attention during functional activites  (OT) Outcome: Completed/Met   Problem: RH Grooming Goal: LTG Patient will perform grooming w/assist,cues/equip (OT) Description LTG: Patient will perform grooming with assist, with/without cues using equipment (OT) Outcome: Completed/Met   Problem: RH Bathing Goal: LTG Patient will bathe all body parts with assist levels (OT) Description LTG: Patient will bathe all body parts with assist levels (OT) Outcome: Completed/Met   Problem: RH Dressing Goal: LTG Patient will perform upper body dressing (OT) Description LTG Patient will perform upper body dressing with assist, with/without cues (OT). Outcome: Completed/Met Goal: LTG Patient will perform lower body dressing w/assist (OT) Description LTG: Patient will perform lower body dressing with assist, with/without cues in positioning using equipment (OT) Outcome: Completed/Met   Problem: RH Toileting Goal: LTG Patient will perform toileting task (3/3 steps) with assistance level (OT) Description LTG: Patient will perform  toileting task (3/3 steps) with assistance level (OT)  Outcome: Completed/Met   Problem: RH Toilet Transfers Goal: LTG Patient will perform toilet transfers w/assist (OT) Description LTG: Patient will perform toilet transfers with assist, with/without cues using equipment (OT) Outcome: Completed/Met   Problem: RH Tub/Shower Transfers Goal: LTG Patient will perform tub/shower transfers w/assist (OT) Description LTG: Patient will perform tub/shower transfers with assist, with/without cues using equipment (OT) Outcome: Completed/Met

## 2018-01-13 NOTE — Plan of Care (Signed)
  Problem: Consults Goal: RH GENERAL PATIENT EDUCATION Description See Patient Education module for education specifics. Outcome: Progressing   Problem: RH BOWEL ELIMINATION Goal: RH STG MANAGE BOWEL WITH ASSISTANCE Description STG Manage Bowel with mod I Assistance.  Outcome: Progressing   Problem: RH SKIN INTEGRITY Goal: RH STG MAINTAIN SKIN INTEGRITY WITH ASSISTANCE Description STG Maintain Skin Integrity With min Assistance.  Outcome: Progressing   Problem: RH SAFETY Goal: RH STG ADHERE TO SAFETY PRECAUTIONS W/ASSISTANCE/DEVICE Description STG Adhere to Safety Precautions With cues/reminders Assistance/Device.  Outcome: Progressing   Problem: RH KNOWLEDGE DEFICIT GENERAL Goal: RH STG INCREASE KNOWLEDGE OF SELF CARE AFTER HOSPITALIZATION Description Pt will be able to manage self care and direct others to provide care needed with min assist  Outcome: Progressing   Problem: Education: Goal: Ability to describe self-care measures that may prevent or decrease complications (Diabetes Survival Skills Education) will improve Outcome: Progressing Goal: Individualized Educational Video(s) Outcome: Progressing   Problem: Health Behavior/Discharge Planning: Goal: Ability to manage health-related needs will improve Description Patient will be able to manage self care or direct care needed at discharge with min assistance  Outcome: Progressing   Problem: Metabolic: Goal: Ability to maintain appropriate glucose levels will improve Description Pt will be able to explain normal glucose levels and how to manage glucose levels, hyper and hypo glycemia management, dietary restrictions and exercise regimen with min assist  Outcome: Progressing   Problem: Nutritional: Goal: Maintenance of adequate nutrition will improve Description Pt will be able to demonstrate understanding of CMM diet and adequate intake using handouts/resources with cues/supervision at discharge  Outcome:  Progressing

## 2018-01-13 NOTE — Progress Notes (Signed)
Pt discharged with son and daughter.  Pt without complaints in stable condition.  CGB was 312 prior to lunch, however, pt had an extra nutritional drink.  Pt states she checks her blood sugar twice a day and will check after supper before bed tonight.  Son is aware.  Belongings with family, discharge instructions completed per [place] PA.

## 2018-01-13 NOTE — Progress Notes (Signed)
Speech Language Pathology Discharge Summary  Patient Details  Name: [name] MRN: [id number] Date of Birth: 10/18/1932  Today's Date: 01/13/2018 SLP Individual Time: 0745-0815 SLP Individual Time Calculation (min): 30 min   Skilled Therapeutic Interventions:  Skilled treatment session focused on cognition goals and education for safe discharge. Pt able to explain current discharge plan and current deficits. Handout given on compensatory memory strategies and pt read and was able to provided examples. Daughter present and no questions were asked regarding ST services.     Patient has met 3 of 3 long term goals.  Patient to discharge at overall Supervision level.   Clinical Impression/Discharge Summary:   Pt is discharging at an overall supervision level with no further indication that follow up skilled ST is indicated. Pt appears at baseline from ST abilities.   Care Partner:  Caregiver Able to Provide Assistance: Yes     Recommendation:  None      Equipment:     Reasons for discharge: Treatment goals met;Discharged from hospital   Patient/Family Agrees with Progress Made and Goals Achieved: Yes    [name] 01/13/2018, 10:10 AM    

## 2018-01-13 NOTE — Progress Notes (Signed)
Physical Therapy Discharge Summary  Patient Details  Name: [name] MRN: [id number] Date of Birth: 03/07/1932  Today's Date: 01/13/2018 PT Individual Time: 0900-0935 PT Individual Time Calculation (min): 35 min    Patient has met 6 of 7 long term goals due to improved activity tolerance, improved balance, improved postural control, increased strength, ability to compensate for deficits, functional use of  right upper extremity, right lower extremity, left upper extremity and left lower extremity and improved awareness.  Patient to discharge at an ambulatory level Supervision.   Patient's care partner is independent to provide the necessary cognitive assistance at discharge.  Reasons goals not met: pt needed tactile cues for safe hand placement for simulated car transfer  Recommendation:  Patient will benefit from ongoing skilled PT services in home health setting to continue to advance safe functional mobility, address ongoing impairments in balance, motor control, endurance, cognition, and minimize fall risk.  Equipment: RW  Reasons for discharge: treatment goals met and discharge from hospital  Patient/family agrees with progress made and goals achieved: Yes  PT Discharge Pt seen for short session focused on family ed for gait and simulated car transfer.  tx : Pt now plans to go to her own apartment x 2 days, with dtr Andee Poles.  This apartment has ramped entrance in back, which is pt's usual entry.  Pt managed ramp well yesterday.  Steps have not been a focus of tx as pt does not have to negotiate them at her or her dtr's house.  Family education with Danielle for simulated car transfer with step to enter vehicle, to simulate high SUV seat.  Pt performed safely with close supervision, tactile cues for hand placement and and VCs for technique.  Danielle safely assisted her mother.  Gait training with RW on level tile, supervision.  Danielle and pt's son safely supervised their  mother.  PT instructed Danielle to avoid distractions of using her cell phone while supervising her mother walking, at d/c.  She verbalized understanding.  Pt left resting in w/c with needs at hand, and son in attendance.   Precautions/Restrictions Precautions Precautions: Fall Restrictions Weight Bearing Restrictions: No  Pain Pain Assessment Pain Scale: 0-10 Pain Score: 0-No pain Vision/Perception No change from baseline; no deficits    Cognition Overall Cognitive Status: Within Functional Limits for tasks assessed Arousal/Alertness: Awake/alert Orientation Level: Oriented X4 Attention: Selective Selective Attention: Appears intact Memory: Appears intact Awareness: Appears intact Problem Solving: Appears intact Sensation Sensation Light Touch: Appears Intact Proprioception: Appears Intact Coordination Gross Motor Movements are Fluid and Coordinated: Yes Coordination and Movement Description: improved since eval Motor  Motor Motor - Discharge Observations: improved overall strength and activity tolerance  Mobility Transfers Sit to Stand: Independent with assistive device Stand Pivot Transfers: Supervision/Verbal cueing Transfer (Assistive device): Rolling walker Locomotion  Gait Ambulation: Yes Gait Assistance: Set up assist Gait Distance (Feet): 150 Feet Assistive device: Rolling walker Gait Assistance Details: Verbal cues for precautions/safety Gait Gait: Yes Gait Pattern: Impaired Gait Pattern: Decreased hip/knee flexion - right;Decreased hip/knee flexion - left;Narrow base of support;Decreased trunk rotation Gait velocity: decreased Stairs / Additional Locomotion Stairs: Yes  Last performed 12/21 Stairs Assistance: min assist Stair Management Technique: Two rails Number of Stairs: 4 Height of Stairs: 6 Ramp: Supervision/Verbal cueing Wheelchair Mobility Wheelchair Mobility: No  Trunk/Postural Assessment  Cervical Assessment Cervical Assessment:  Exceptions to WFL(forward head posture) Thoracic Assessment Thoracic Assessment: Exceptions to WFL(kyphotic) Lumbar Assessment Lumbar Assessment: Exceptions to WFL(posteior pelvic tilt) Postural Control Postural Control:  Deficits on evaluation(decreased balance reations) Protective Responses: minimal bil ankle strategies, emerging bil hip and stepping strategies with practice  Balance Balance Balance Assessed: Yes Standardized Balance Assessment Standardized Balance Assessment: Berg Balance Test(dated 12/22) Berg Balance Test Sit to Stand: Able to stand  independently using hands Standing Unsupported: Able to stand 2 minutes with supervision Sitting with Back Unsupported but Feet Supported on Floor or Stool: Able to sit safely and securely 2 minutes Stand to Sit: Controls descent by using hands Transfers: Able to transfer safely, definite need of hands Standing Unsupported with Eyes Closed: Able to stand 10 seconds with supervision Standing Ubsupported with Feet Together: Able to place feet together independently and stand for 1 minute with supervision From Standing, Reach Forward with Outstretched Arm: Can reach forward >12 cm safely (5") From Standing Position, Pick up Object from Floor: Able to pick up shoe, needs supervision From Standing Position, Turn to Look Behind Over each Shoulder: Looks behind one side only/other side shows less weight shift Turn 360 Degrees: Needs close supervision or verbal cueing Standing Unsupported, Alternately Place Feet on Step/Stool: Able to stand independently and complete 8 steps >20 seconds Standing Unsupported, One Foot in Front: Able to take small step independently and hold 30 seconds Standing on One Leg: Tries to lift leg/unable to hold 3 seconds but remains standing independently Total Score: 38 Dynamic Sitting Balance Dynamic Sitting - Level of Assistance: 7: Independent Static Standing Balance Static Standing - Level of Assistance: 6:  Modified independent (Device/Increase time) Dynamic Standing Balance Dynamic Standing - Level of Assistance: 6: Modified independent (Device/Increase time) Extremity Assessment  Per OT:RUE Assessment RUE Assessment: Within Functional Limits LUE Assessment LUE Assessment: Within Functional Limits   RLE Assessment RLE Assessment: Exceptions to [facility] General Strength Comments: grossly  4/5> 4-/5 hip, 4/5 knee; 4+/5 ankle DF LLE Assessment LLE Assessment: Exceptions to [facility] General Strength Comments: grossly 4-/5> 4-/5 hip, 4/5 knee, 4+/5 ankle DF    Serenna Deroy 01/13/2018, 1:15 PM

## 2018-01-13 NOTE — Patient Care Conference (Cosign Needed)
Inpatient RehabilitationTeam Conference and Plan of Care Update Date: 01/13/2018   Time: 9:00 PM    Patient Name: [name]      Medical Record Number: [id number]  Date of Birth: 1932-11-22 Sex: Female         Room/Bed: 4W02C/4W02C-01 Payor Info: Payor: [employer] MEDICARE / Plan: [place] HMO / Product Type: *No Product type* /    Admitting Diagnosis: seizures  Admit Date/Time:  01/09/2018  4:35 PM Admission Comments: No comment available   Primary Diagnosis:  <principal problem not specified> Principal Problem: <principal problem not specified>  Patient Active Problem List   Diagnosis Date Noted  . Hypoalbuminemia due to protein-calorie malnutrition ([place])   . AKI (acute kidney injury) ([place])   . CKD (chronic kidney disease), stage III ([place])   . Acute blood loss anemia   . Leukocytosis   . Poorly controlled type 2 diabetes mellitus with peripheral neuropathy ([place])   . Labile blood glucose   . Hypoglycemia   . Labile blood pressure   . Acute encephalopathy 01/09/2018  . PAF (paroxysmal atrial fibrillation) ([place])   . Type 2 diabetes mellitus with peripheral neuropathy ([facility])   . Morbid obesity ([place])   . Status epilepticus ([place]) 01/01/2018  . Accelerated hypertension 01/01/2018  . Acute kidney injury ([place]) 01/01/2018  . Type 2 diabetes mellitus ([place]) 01/01/2018  . Respiratory failure ([place])   . Hyperkalemia 07/25/2017  . Bradycardia 07/25/2017  . Hypotension 07/25/2017  . History of stroke 07/25/2017  . ARF (acute renal failure) ([place]) 07/25/2017  . TIA (transient ischemic attack) 04/08/2016  . Hypocalcemia 04/08/2016  . OBESITY 01/10/2010  . DYSURIA 01/04/2010  . KNEE PAIN, RIGHT 09/05/2009  . HYPERSOMNIA 05/30/2009  . URI 05/11/2009  . SNORING 05/11/2009  . CONTACT DERMATITIS&OTHER ECZEMA DUE UNSPEC CAUSE 11/28/2008  . THROMBOCYTOSIS 08/18/2008  . ANEMIA, IRON DEFICIENCY 08/11/2008  . PERIPHERAL EDEMA 08/10/2008  . POLYP, GALLBLADDER 03/07/2008  . NONSPECIFIC  ABN FINDNG RAD&OTH EXAM BILARY TRCT 03/07/2008  . Diabetes mellitus type II, uncontrolled ([place]) 03/01/2008  . Essential hypertension 03/01/2008  . INTERNAL HEMORRHOIDS 03/01/2008  . GERD 03/01/2008  . ANGIOEDEMA 03/01/2008  . CEREBROVASCULAR ACCIDENT, HX OF 03/01/2008  . SMALL BOWEL OBSTRUCTION, HX OF 03/01/2008    Expected Discharge Date: Expected Discharge Date: 01/13/18  Team Members Present: Physician leading conference: Dr. Delice Lesch Social Worker Present: Lennart Pall, LCSW Nurse Present: Dorien Chihuahua, RN PT Present: Leavy Cella, PT OT Present: Cherylynn Ridges, OT SLP Present: Stormy Fabian, SLP PPS Coordinator present : Daiva Nakayama, RN, CRRN     Current Status/Progress Goal Weekly Team Focus  Medical   Debility secondary to suspect seizure/encephalopathy  Improve mobility, cognition, BP/DM, AKI  See above   Bowel/Bladder   continent of b/b, bladder urgency, LBM 12/23  maintain b/b with mod I A  monitor b/b q shift and prn   Swallow/Nutrition/ Hydration   Intermittent supervision with regular  Mod I  use of safe swallow strategies   ADL's   Supervision  Supervision  dc planning, modified bathing/dressing, pt/family ed   Mobility   supervision  supervisoin   d/c planning   Communication             Safety/Cognition/ Behavioral Observations  Min A to supervision - basic daily problem solving, selective attention  Min A to supervision  selective attention, novel problem solving card game   Pain   no c/o of pain  pain <2  monitor pain q shift and prn  Skin   skin is intact without any breakdown or problem  maintain skin with min A  monitor q shift andp rn    Rehab Goals Patient on target to meet rehab goals: Yes *See Care Plan and progress notes for long and short-term goals.     Barriers to Discharge  Current Status/Progress Possible Resolutions Date Resolved   Physician    Medical stability     See above  Therapies, follow labs, optimize DM/HTN meds, making  good progress      Nursing                  PT                    OT Other (comments)  none known at this time             SLP                SW                Discharge Planning/Teaching Needs:  Home today with daughters and other family providing 24/7 supervision  Teaching being completed today.   Team Discussion:  Pt has met goals of supervision overall and ready for d/c home with daughter today.  Rec HH follow up.  Revisions to Treatment Plan:  NA    Continued Need for Acute Rehabilitation Level of Care: The patient requires daily medical management by a physician with specialized training in physical medicine and rehabilitation for the following conditions: Daily direction of a multidisciplinary physical rehabilitation program to ensure safe treatment while eliciting the highest outcome that is of practical value to the patient.: Yes Daily medical management of patient stability for increased activity during participation in an intensive rehabilitation regime.: Yes Daily analysis of laboratory values and/or radiology reports with any subsequent need for medication adjustment of medical intervention for : Neurological problems;Diabetes problems;Blood pressure problems;Mood/behavior problems   I attest that I was present, lead the team conference, and concur with the assessment and plan of the team.   Pranavi Aure 01/13/2018, 1:29 PM

## 2018-01-13 NOTE — Plan of Care (Signed)
  Problem: Consults Goal: RH GENERAL PATIENT EDUCATION Description See Patient Education module for education specifics. 01/13/2018 1138 by Etheleen Nicks, RN Outcome: Completed/Met 01/13/2018 1133 by Etheleen Nicks, RN Outcome: Progressing   Problem: RH BOWEL ELIMINATION Goal: RH STG MANAGE BOWEL WITH ASSISTANCE Description STG Manage Bowel with mod I Assistance.  01/13/2018 1138 by Etheleen Nicks, RN Outcome: Completed/Met 01/13/2018 1133 by Etheleen Nicks, RN Outcome: Progressing   Problem: RH SKIN INTEGRITY Goal: RH STG MAINTAIN SKIN INTEGRITY WITH ASSISTANCE Description STG Maintain Skin Integrity With min Assistance.  01/13/2018 1138 by Etheleen Nicks, RN Outcome: Completed/Met 01/13/2018 1133 by Etheleen Nicks, RN Outcome: Progressing   Problem: RH SAFETY Goal: RH STG ADHERE TO SAFETY PRECAUTIONS W/ASSISTANCE/DEVICE Description STG Adhere to Safety Precautions With cues/reminders Assistance/Device.  01/13/2018 1138 by Etheleen Nicks, RN Outcome: Completed/Met 01/13/2018 1133 by Etheleen Nicks, RN Outcome: Progressing   Problem: RH KNOWLEDGE DEFICIT GENERAL Goal: RH STG INCREASE KNOWLEDGE OF SELF CARE AFTER HOSPITALIZATION Description Pt will be able to manage self care and direct others to provide care needed with min assist  01/13/2018 1138 by Etheleen Nicks, RN Outcome: Completed/Met 01/13/2018 1133 by Etheleen Nicks, RN Outcome: Progressing   Problem: Education: Goal: Ability to describe self-care measures that may prevent or decrease complications (Diabetes Survival Skills Education) will improve 01/13/2018 1138 by Etheleen Nicks, RN Outcome: Completed/Met 01/13/2018 1133 by Etheleen Nicks, RN Outcome: Progressing Goal: Individualized Educational Video(s) 01/13/2018 1138 by Etheleen Nicks, RN Outcome: Completed/Met 01/13/2018 1133 by Etheleen Nicks, RN Outcome: Progressing   Problem: Health Behavior/Discharge Planning: Goal: Ability to manage health-related needs will  improve Description Patient will be able to manage self care or direct care needed at discharge with min assistance  01/13/2018 1138 by Etheleen Nicks, RN Outcome: Completed/Met 01/13/2018 1133 by Etheleen Nicks, RN Outcome: Progressing   Problem: Metabolic: Goal: Ability to maintain appropriate glucose levels will improve Description Pt will be able to explain normal glucose levels and how to manage glucose levels, hyper and hypo glycemia management, dietary restrictions and exercise regimen with min assist  01/13/2018 1138 by Etheleen Nicks, RN Outcome: Completed/Met 01/13/2018 1133 by Etheleen Nicks, RN Outcome: Progressing   Problem: Nutritional: Goal: Maintenance of adequate nutrition will improve Description Pt will be able to demonstrate understanding of CMM diet and adequate intake using handouts/resources with cues/supervision at discharge  01/13/2018 1138 by Etheleen Nicks, RN Outcome: Completed/Met 01/13/2018 1133 by Etheleen Nicks, RN Outcome: Progressing

## 2018-01-13 NOTE — Discharge Instructions (Signed)
Inpatient Rehab Discharge Instructions  [name] Discharge date and time: No discharge date for patient encounter.   Activities/Precautions/ Functional Status: Activity: activity as tolerated Diet: diabetic diet Wound Care: none needed Functional status:  ___ No restrictions     ___ Walk up steps independently ___ 24/7 supervision/assistance   ___ Walk up steps with assistance ___ Intermittent supervision/assistance  ___ Bathe/dress independently ___ Walk with walker     _x__ Bathe/dress with assistance ___ Walk Independently    ___ Shower independently ___ Walk with assistance    ___ Shower with assistance ___ No alcohol     ___ Return to work/school ________     COMMUNITY REFERRALS UPON DISCHARGE:    Home Health:   PT     OT     ST                      Agency:  [facility] Phone: [phone]   Medical Equipment/Items Ordered:  Rolling walker, tub bench                                                      Agency/Supplier:  [facility] @ [phone]     Special Instructions:    My questions have been answered and I understand these instructions. I will adhere to these goals and the provided educational materials after my discharge from the hospital.  Patient/Caregiver Signature _______________________________ Date __________  Clinician Signature _______________________________________ Date __________  Please bring this form and your medication list with you to all your follow-up doctor's appointments. Information on my medicine - XARELTO (Rivaroxaban)  This medication education was reviewed with me or my healthcare representative as part of my discharge preparation.  Why was Xarelto prescribed for you? Xarelto was prescribed for you to reduce the risk of a blood clot forming that can cause a stroke if you have a medical condition called atrial fibrillation (a type of irregular heartbeat).  What do you need to know about xarelto ? Take your  Xarelto ONCE DAILY at the same time every day with your evening meal. If you have difficulty swallowing the tablet whole, you may crush it and mix in applesauce just prior to taking your dose.  Take Xarelto exactly as prescribed by your doctor and DO NOT stop taking Xarelto without talking to the doctor who prescribed the medication.  Stopping without other stroke prevention medication to take the place of Xarelto may increase your risk of developing a clot that causes a stroke.  Refill your prescription before you run out.  After discharge, you should have regular check-up appointments with your healthcare provider that is prescribing your Xarelto.  In the future your dose may need to be changed if your kidney function or weight changes by a significant amount.  What do you do if you miss a dose? If you are taking Xarelto ONCE DAILY and you miss a dose, take it as soon as you remember on the same day then continue your regularly scheduled once daily regimen the next day. Do not take two doses of Xarelto at the same time or on the same day.   Important Safety Information A possible side effect of Xarelto is bleeding. You should call your healthcare provider right away if you experience any of the following: ? Bleeding  from an injury or your nose that does not stop. ? Unusual colored urine (red or dark brown) or unusual colored stools (red or black). ? Unusual bruising for unknown reasons. ? A serious fall or if you hit your head (even if there is no bleeding).  Some medicines may interact with Xarelto and might increase your risk of bleeding while on Xarelto. To help avoid this, consult your healthcare provider or pharmacist prior to using any new prescription or non-prescription medications, including herbals, vitamins, non-steroidal anti-inflammatory drugs (NSAIDs) and supplements.  This website has more information on Xarelto: [email or web address]

## 2018-01-15 ENCOUNTER — Inpatient Hospital Stay (HOSPITAL_COMMUNITY): Payer: Medicare HMO | Admitting: Physical Therapy

## 2018-01-19 DIAGNOSIS — I480 Paroxysmal atrial fibrillation: Secondary | ICD-10-CM | POA: Diagnosis not present

## 2018-01-19 DIAGNOSIS — G40901 Epilepsy, unspecified, not intractable, with status epilepticus: Secondary | ICD-10-CM | POA: Diagnosis not present

## 2018-01-19 DIAGNOSIS — E1142 Type 2 diabetes mellitus with diabetic polyneuropathy: Secondary | ICD-10-CM | POA: Diagnosis not present

## 2018-01-19 DIAGNOSIS — Z7901 Long term (current) use of anticoagulants: Secondary | ICD-10-CM | POA: Diagnosis not present

## 2018-01-19 DIAGNOSIS — R1310 Dysphagia, unspecified: Secondary | ICD-10-CM | POA: Diagnosis not present

## 2018-01-19 DIAGNOSIS — E669 Obesity, unspecified: Secondary | ICD-10-CM | POA: Diagnosis not present

## 2018-01-19 DIAGNOSIS — G4710 Hypersomnia, unspecified: Secondary | ICD-10-CM | POA: Diagnosis not present

## 2018-01-19 DIAGNOSIS — D509 Iron deficiency anemia, unspecified: Secondary | ICD-10-CM | POA: Diagnosis not present

## 2018-01-19 DIAGNOSIS — I10 Essential (primary) hypertension: Secondary | ICD-10-CM | POA: Diagnosis not present

## 2018-01-20 ENCOUNTER — Telehealth: Payer: Self-pay

## 2018-01-20 DIAGNOSIS — R1310 Dysphagia, unspecified: Secondary | ICD-10-CM | POA: Diagnosis not present

## 2018-01-20 DIAGNOSIS — E669 Obesity, unspecified: Secondary | ICD-10-CM | POA: Diagnosis not present

## 2018-01-20 DIAGNOSIS — G4710 Hypersomnia, unspecified: Secondary | ICD-10-CM | POA: Diagnosis not present

## 2018-01-20 DIAGNOSIS — Z7901 Long term (current) use of anticoagulants: Secondary | ICD-10-CM | POA: Diagnosis not present

## 2018-01-20 DIAGNOSIS — D509 Iron deficiency anemia, unspecified: Secondary | ICD-10-CM | POA: Diagnosis not present

## 2018-01-20 DIAGNOSIS — I480 Paroxysmal atrial fibrillation: Secondary | ICD-10-CM | POA: Diagnosis not present

## 2018-01-20 DIAGNOSIS — I10 Essential (primary) hypertension: Secondary | ICD-10-CM | POA: Diagnosis not present

## 2018-01-20 DIAGNOSIS — G40901 Epilepsy, unspecified, not intractable, with status epilepticus: Secondary | ICD-10-CM | POA: Diagnosis not present

## 2018-01-20 DIAGNOSIS — E1142 Type 2 diabetes mellitus with diabetic polyneuropathy: Secondary | ICD-10-CM | POA: Diagnosis not present

## 2018-01-20 NOTE — Telephone Encounter (Signed)
Merry Proud, PT from [facility] called requesting verbal orders for HHPT 2wk2, 1wk2, 1 every other week 4. Orders approved and given per discharge summary.

## 2018-01-26 ENCOUNTER — Telehealth: Payer: Self-pay | Admitting: *Deleted

## 2018-01-26 NOTE — Telephone Encounter (Signed)
Curt Bears OT [facility] called for 1 wk 4 POC.  Approval given.

## 2018-02-02 DIAGNOSIS — G4710 Hypersomnia, unspecified: Secondary | ICD-10-CM

## 2018-02-02 DIAGNOSIS — G40901 Epilepsy, unspecified, not intractable, with status epilepticus: Secondary | ICD-10-CM

## 2018-02-02 DIAGNOSIS — Z9181 History of falling: Secondary | ICD-10-CM

## 2018-02-02 DIAGNOSIS — E1142 Type 2 diabetes mellitus with diabetic polyneuropathy: Secondary | ICD-10-CM

## 2018-02-02 DIAGNOSIS — E669 Obesity, unspecified: Secondary | ICD-10-CM

## 2018-02-02 DIAGNOSIS — D509 Iron deficiency anemia, unspecified: Secondary | ICD-10-CM

## 2018-02-02 DIAGNOSIS — Z8673 Personal history of transient ischemic attack (TIA), and cerebral infarction without residual deficits: Secondary | ICD-10-CM

## 2018-02-02 DIAGNOSIS — Z7901 Long term (current) use of anticoagulants: Secondary | ICD-10-CM

## 2018-02-02 DIAGNOSIS — R1310 Dysphagia, unspecified: Secondary | ICD-10-CM

## 2018-02-02 DIAGNOSIS — Z794 Long term (current) use of insulin: Secondary | ICD-10-CM

## 2018-02-02 DIAGNOSIS — I480 Paroxysmal atrial fibrillation: Secondary | ICD-10-CM

## 2018-02-02 DIAGNOSIS — I10 Essential (primary) hypertension: Secondary | ICD-10-CM

## 2018-02-05 ENCOUNTER — Encounter: Payer: Self-pay | Admitting: Registered Nurse

## 2018-02-05 ENCOUNTER — Encounter: Payer: Medicare Other | Attending: Registered Nurse | Admitting: Registered Nurse

## 2018-02-05 VITALS — BP 153/97 | HR 88 | Ht 62.0 in | Wt 192.0 lb

## 2018-02-05 DIAGNOSIS — Z298 Encounter for other specified prophylactic measures: Secondary | ICD-10-CM | POA: Diagnosis present

## 2018-02-05 DIAGNOSIS — E1142 Type 2 diabetes mellitus with diabetic polyneuropathy: Secondary | ICD-10-CM | POA: Diagnosis present

## 2018-02-05 DIAGNOSIS — G9340 Encephalopathy, unspecified: Secondary | ICD-10-CM | POA: Insufficient documentation

## 2018-02-05 DIAGNOSIS — I10 Essential (primary) hypertension: Secondary | ICD-10-CM | POA: Insufficient documentation

## 2018-02-05 DIAGNOSIS — Z8673 Personal history of transient ischemic attack (TIA), and cerebral infarction without residual deficits: Secondary | ICD-10-CM | POA: Insufficient documentation

## 2018-02-05 DIAGNOSIS — I480 Paroxysmal atrial fibrillation: Secondary | ICD-10-CM | POA: Diagnosis present

## 2018-02-05 NOTE — Progress Notes (Signed)
Subjective:    Patient ID: [name], female    DOB: 1932/12/07, 85 y.o.   MRN: [id number]  HPI: [name] is a 85 y.o. female who  Is here for [name] follow up appointment. She presented to [name] on 12/31/2017 with slurred speech she had a CT scan and MRI.  CT Scan :  The above test was negative and  she was discharged home.  IMPRESSION: 1. No CT evidence for acute intracranial abnormality. 2. Atrophy and small vessel ischemic changes of the white matter.  MRI of [place]: WO Contrast:  IMPRESSION: 1. No acute intracranial process. 2. Multiple old cerebellar infarcts. 3. Moderate to severe chronic small vessel ischemic changes.  On 01/01/2018 she was found unresponsive by her grandson and EMS was called, in route to the emergency department she experienced seizure like activity.  CT Head WO Contrast repeated:  IMPRESSION: 1. No acute intracranial hemorrhage. 2. No acute/traumatic cervical spine pathology.  EEG Performed on 01/01/2018: She was loaded on Keppra.   Impression: This EEG shows evidence of a severe diffuse encephalopathy. No epileptiform discharges or EEG seizures were recorded.    [name] was admitted for comprehensive rehabilitation program on 01/09/2018 and discharged home on 01/13/2018.  Today she is here for [name] follow up of her History Stroke, Acute encephalopathy, Seizure Prophylaxis, PAF, Hypertension, Type 2 DM with peripheral neuropathy and Morbid Obesity.  She has been home receiving [place] with [facility]. She denies pain. She rates her pain 0. Also reports good appetite.   Daughter in room all questions answered.   Pain Inventory Average Pain 5 Pain Right Now 5 My pain is intermittent, dull and aching  In the last 24 hours, has pain interfered with the following? General activity 3 Relation with others 3 Enjoyment of life 3 What TIME of day is your pain at its worst? all Sleep (in general)  Good  Pain is worse with: sitting Pain improves with: medication Relief from Meds: 7  Mobility use a walker do you drive?  no  Function retired  Neuro/Psych trouble walking  Prior Studies Any changes since last visit?  no  Physicians involved in your care Any changes since last visit?  no   Family History  Problem Relation Age of Onset  . Diabetes Mother   . Heart disease Mother   . Diabetes Sister   . Heart disease Sister   . Colitis Sister   . Kidney disease Brother   . Breast cancer Other        Neice  . Allergies Other        Nephrew  . Asthma Other        Nephrew   Social History   Socioeconomic History  . Marital status: Divorced    Spouse name: Not on file  . Number of children: 5  . Years of education: 69  . Highest education level: Not on file  Occupational History    Comment: counselor  Social Needs  . Financial resource strain: Not on file  . Food insecurity:    Worry: Not on file    Inability: Not on file  . Transportation needs:    Medical: Not on file    Non-medical: Not on file  Tobacco Use  . Smoking status: Never Smoker  . Smokeless tobacco: Never Used  Substance and Sexual Activity  . Alcohol use: No  . Drug use: No  . Sexual activity: Not Currently  Lifestyle  . Physical activity:    Days per week: Not on file    Minutes per session: Not on file  . Stress: Not on file  Relationships  . Social connections:    Talks on phone: Not on file    Gets together: Not on file    Attends religious service: Not on file    Active member of club or organization: Not on file    Attends meetings of clubs or organizations: Not on file    Relationship status: Not on file  Other Topics Concern  . Not on file  Social History Narrative   Lives with daughter, [name]   Past Surgical History:  Procedure Laterality Date  . HERNIA REPAIR  09/14/08   with release [facility]-Dr. Dalbert Batman  . SMALL INTESTINE SURGERY    . 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 A-fib [facility]) 2019   Per patients daughter, [name] [phone]  . ANEMIA, IRON DEFICIENCY 08/11/2008  . ANGIOEDEMA 03/01/2008  . CEREBROVASCULAR ACCIDENT, HX OF 03/01/2008  . CONTACT DERMATITIS&OTHER ECZEMA DUE UNSPEC CAUSE 11/28/2008  . DIABETES MELLITUS 03/01/2008  . Dysuria 01/04/2010  . GERD 03/01/2008  . HYPERSOMNIA 05/30/2009  . HYPERTENSION 03/01/2008  . INTERNAL HEMORRHOIDS 03/01/2008  . KNEE PAIN, RIGHT 09/05/2009  . NONSPECIFIC ABN FINDNG RAD&OTH EXAM BILARY TRCT 03/07/2008  . OBESITY 01/10/2010  . PERIPHERAL EDEMA 08/10/2008  . POLYP, GALLBLADDER 03/07/2008  . SMALL BOWEL OBSTRUCTION, HX OF 03/01/2008  . SNORING 05/11/2009  . Stroke ([place]) 04/08/2016   TIA  . THROMBOCYTOSIS 08/18/2008   BP (!) 153/97   Pulse 88 Comment: manual at wrist  Ht 5\' 2"  (1.575 m)   Wt 192 lb (87.1 kg)   SpO2 92%   BMI 35.12 kg/m   Opioid Risk Score:   Fall Risk Score:  `1  Depression screen PHQ 2/9  No flowsheet data found.   Review of Systems  Constitutional: Negative.   HENT: Negative.   Eyes: Negative.   Respiratory: Negative.   Cardiovascular: Negative.   Gastrointestinal: Negative.   Endocrine: Negative.   Genitourinary: Negative.   Musculoskeletal: Positive for arthralgias, gait problem and myalgias.  Skin: Negative.   Allergic/Immunologic: Negative.   Hematological: Negative.   Psychiatric/Behavioral: Negative.   All other systems reviewed and are negative.      Objective:   Physical Exam Vitals signs and nursing note reviewed.  Constitutional:      Appearance: Normal appearance. She is obese.  Neck:     Musculoskeletal: Normal range of motion and neck supple.  Cardiovascular:     Rate and Rhythm: Normal rate and regular rhythm.     Pulses: Normal pulses.     Heart sounds: Normal heart sounds.  Pulmonary:     Effort: Pulmonary effort is normal.     Breath sounds: Normal  breath sounds.  Musculoskeletal:     Comments: Normal Muscle Bulk and Muscle Testing Reveals:  Upper Extremities:Full  ROM and Muscle Strength on the Right: 5/5 and Left 4/5  Lower Extremities: Full ROM and Muscle Strength 5/5 Arises from Table using walker for support Narrow Based  Gait   Skin:    General: Skin is warm and dry.  Neurological:     Mental Status: She is alert and oriented to person, place, and time.  Psychiatric:        Mood and Affect: Mood normal.  Behavior: Behavior normal.           Assessment & Plan:  1. History of Stroke: Continue Outpatient Therapy: Has a schedule F.U appointment with Neurology.  2. Acute Encephalopathy/ Seizure Prophylaxis: Continue Keppra: Has a schedule appointment with Neurology.  3. PAF: Continue Xarelto  4. Hypertension: Continue current medication regimen. PCP Following. 5. Type 2 DM with peripheral Neuropathy> Continue current medication regime. PCP Following.  6. Morbid Obesity. Continue with Healthy Diet Regimen and HEP as Tolerated. Continue to Monitor.   30 minutes of face to face patient care time was spent during this visit. All questions were encouraged and answered.  F/U in 4- 6 weeks with Dr. Naaman Plummer

## 2018-02-06 ENCOUNTER — Telehealth: Payer: Self-pay

## 2018-02-06 NOTE — Telephone Encounter (Signed)
Laqueta Due from [facility] called stating pt cancelled PT visit this week.

## 2018-03-04 DIAGNOSIS — G4710 Hypersomnia, unspecified: Secondary | ICD-10-CM

## 2018-03-04 DIAGNOSIS — I10 Essential (primary) hypertension: Secondary | ICD-10-CM | POA: Diagnosis not present

## 2018-03-04 DIAGNOSIS — D509 Iron deficiency anemia, unspecified: Secondary | ICD-10-CM

## 2018-03-04 DIAGNOSIS — I480 Paroxysmal atrial fibrillation: Secondary | ICD-10-CM | POA: Diagnosis not present

## 2018-03-04 DIAGNOSIS — G40901 Epilepsy, unspecified, not intractable, with status epilepticus: Secondary | ICD-10-CM

## 2018-03-04 DIAGNOSIS — E1142 Type 2 diabetes mellitus with diabetic polyneuropathy: Secondary | ICD-10-CM | POA: Diagnosis not present

## 2018-03-04 DIAGNOSIS — R1310 Dysphagia, unspecified: Secondary | ICD-10-CM

## 2018-03-04 DIAGNOSIS — Z794 Long term (current) use of insulin: Secondary | ICD-10-CM

## 2018-03-04 DIAGNOSIS — Z8673 Personal history of transient ischemic attack (TIA), and cerebral infarction without residual deficits: Secondary | ICD-10-CM

## 2018-03-04 DIAGNOSIS — Z7901 Long term (current) use of anticoagulants: Secondary | ICD-10-CM

## 2018-03-04 DIAGNOSIS — E669 Obesity, unspecified: Secondary | ICD-10-CM

## 2018-03-16 ENCOUNTER — Institutional Professional Consult (permissible substitution): Payer: Medicare HMO | Admitting: Diagnostic Neuroimaging

## 2018-03-23 ENCOUNTER — Ambulatory Visit: Payer: Medicare Other | Admitting: Physical Medicine & Rehabilitation

## 2018-03-25 ENCOUNTER — Encounter: Payer: Medicare Other | Attending: Registered Nurse | Admitting: Physical Medicine & Rehabilitation

## 2018-03-25 ENCOUNTER — Encounter: Payer: Self-pay | Admitting: Physical Medicine & Rehabilitation

## 2018-03-25 VITALS — BP 161/90 | HR 81 | Ht 62.0 in | Wt 198.8 lb

## 2018-03-25 DIAGNOSIS — E1142 Type 2 diabetes mellitus with diabetic polyneuropathy: Secondary | ICD-10-CM

## 2018-03-25 DIAGNOSIS — I10 Essential (primary) hypertension: Secondary | ICD-10-CM | POA: Insufficient documentation

## 2018-03-25 DIAGNOSIS — G9340 Encephalopathy, unspecified: Secondary | ICD-10-CM | POA: Insufficient documentation

## 2018-03-25 DIAGNOSIS — Z8673 Personal history of transient ischemic attack (TIA), and cerebral infarction without residual deficits: Secondary | ICD-10-CM | POA: Diagnosis present

## 2018-03-25 DIAGNOSIS — R269 Unspecified abnormalities of gait and mobility: Secondary | ICD-10-CM | POA: Diagnosis not present

## 2018-03-25 DIAGNOSIS — Z298 Encounter for other specified prophylactic measures: Secondary | ICD-10-CM | POA: Diagnosis present

## 2018-03-25 DIAGNOSIS — I480 Paroxysmal atrial fibrillation: Secondary | ICD-10-CM | POA: Insufficient documentation

## 2018-03-25 NOTE — Progress Notes (Signed)
Subjective:    Patient ID: [name], female    DOB: Jan 31, 1932, 85 y.o.   MRN: [id number]  HPI   This is a follow up visit for Mrs. Salonga who was on inpatient rehab after a seizure disorder and associated functional deficits and encephalopathy.  She has been home with family since discharge.  She is using a quad-based cane at home but typically is walking within the home without the device now.  She uses the cane outside.  There have been no issues with falls or mishaps.  She is able to dress and bathe herself independently.  Patient is anxious to drive but has been told not to given her recent seizures.  She is having some right-sided shoulder pain which is somewhat mild.  It bothers her more when she sleeps.  She tends to sleep on her right side.  She has been doing some gentle exercises for the shoulder but not routinely so.  She states her sugars have been under reasonable control.  Typically she is only checking these in the morning.  Her primary is following her blood pressure and blood sugars.  Pain Inventory Average Pain 4 Pain Right Now 0 My pain is aching  In the last 24 hours, has pain interfered with the following? General activity 0 Relation with others 0 Enjoyment of life 0 What TIME of day is your pain at its worst? varies Sleep (in general) Good  Pain is worse with: na Pain improves with: medication Relief from Meds: 5  Mobility walk with assistance use a cane how many minutes can you walk? 60 ability to climb steps?  yes  Function retired  Neuro/Psych trouble walking  Prior Studies Any changes since last visit?  no  Physicians involved in your care Any changes since last visit?  no   Family History  Problem Relation Age of Onset  . Diabetes Mother   . Heart disease Mother   . Diabetes Sister   . Heart disease Sister   . Colitis Sister   . Kidney disease Brother   . Breast cancer Other        Neice  . Allergies Other        Nephrew  .  Asthma Other        Nephrew   Social History   Socioeconomic History  . Marital status: Divorced    Spouse name: Not on file  . Number of children: 5  . Years of education: 51  . Highest education level: Not on file  Occupational History    Comment: counselor  Social Needs  . Financial resource strain: Not on file  . Food insecurity:    Worry: Not on file    Inability: Not on file  . Transportation needs:    Medical: Not on file    Non-medical: Not on file  Tobacco Use  . Smoking status: Never Smoker  . Smokeless tobacco: Never Used  Substance and Sexual Activity  . Alcohol use: No  . Drug use: No  . Sexual activity: Not Currently  Lifestyle  . Physical activity:    Days per week: Not on file    Minutes per session: Not on file  . Stress: Not on file  Relationships  . Social connections:    Talks on phone: Not on file    Gets together: Not on file    Attends religious service: Not on file    Active member of club or organization: Not on file  Attends meetings of clubs or organizations: Not on file    Relationship status: Not on file  Other Topics Concern  . Not on file  Social History Narrative   Lives with daughter, [name]   Past Surgical History:  Procedure Laterality Date  . HERNIA REPAIR  09/14/08   with release [facility]-Dr. Dalbert Batman  . SMALL INTESTINE SURGERY    . 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 A-fib [facility]) 2019   Per patients daughter, [name] [phone]  . ANEMIA, IRON DEFICIENCY 08/11/2008  . ANGIOEDEMA 03/01/2008  . CEREBROVASCULAR ACCIDENT, HX OF 03/01/2008  . CONTACT DERMATITIS&OTHER ECZEMA DUE UNSPEC CAUSE 11/28/2008  . DIABETES MELLITUS 03/01/2008  . Dysuria 01/04/2010  . GERD 03/01/2008  . HYPERSOMNIA 05/30/2009  . HYPERTENSION 03/01/2008  . INTERNAL HEMORRHOIDS 03/01/2008  . KNEE PAIN, RIGHT 09/05/2009  . NONSPECIFIC ABN FINDNG RAD&OTH EXAM BILARY TRCT  03/07/2008  . OBESITY 01/10/2010  . PERIPHERAL EDEMA 08/10/2008  . POLYP, GALLBLADDER 03/07/2008  . SMALL BOWEL OBSTRUCTION, HX OF 03/01/2008  . SNORING 05/11/2009  . Stroke ([place]) 04/08/2016   TIA  . THROMBOCYTOSIS 08/18/2008   BP (!) 161/90   Pulse 81   Ht 5\' 2"  (1.575 m)   Wt 198 lb 12.8 oz (90.2 kg)   SpO2 95%   BMI 36.36 kg/m   Opioid Risk Score:   Fall Risk Score:  `1  Depression screen PHQ 2/9  Depression screen PHQ 2/9 03/25/2018  Decreased Interest 0  Down, Depressed, Hopeless 0  PHQ - 2 Score 0   Review of Systems  Constitutional: Positive for unexpected weight change.  HENT: Negative.   Eyes: Negative.   Respiratory: Positive for cough.   Cardiovascular: Negative.   Gastrointestinal: Positive for diarrhea.  Endocrine: Negative.   Genitourinary: Negative.   Musculoskeletal: Negative.   Skin: Negative.   Allergic/Immunologic: Negative.   Neurological: Negative.   Hematological: Negative.   Psychiatric/Behavioral: Negative.   All other systems reviewed and are negative.      Objective:   Physical Exam   General: Alert and oriented x 3, No apparent distress HEENT: Head is normocephalic, atraumatic, PERRLA, EOMI, sclera anicteric, oral mucosa pink and moist, dentition intact, ext ear canals clear,  Neck: Supple without JVD or lymphadenopathy Heart: Reg rate and rhythm. No murmurs rubs or gallops Chest: CTA bilaterally without wheezes, rales, or rhonchi; no distress Abdomen: Soft, non-tender, non-distended, bowel sounds positive. Extremities: No clubbing, cyanosis, or edema. Pulses are 2+ Skin: Clean and intact without signs of breakdown Neuro: Pt is cognitively appropriate with normal insight, memory, and awareness. Cranial nerves 2-12 are intact. Sensory exam is normal. Reflexes are 2+ in all 4's. Fine motor coordination is intact. No tremors. Motor function is grossly 4-5/5. Slow to process. HOH. Balance fair. Musculoskeletal: Full ROM, No pain with AROM or  PROM in the neck, trunk, or extremities. Posture appropriate, valgus deformities bilaterally. Uses cane for balance Psych: Pt's affect is appropriate. Pt is cooperative        Assessment & Plan:  1. History of Stroke and associated gait disorder 2. Acute Encephalopathy/ Seizure Prophylaxis: Continue Keppra:   -no driving, d/w pt and daughter 3. PAF:   Xarelto longterm  4. Hypertension:  Borderline today. 5. Type 2 DM with peripheral Neuropathy: fair control, probably needs further adjustments.  -discussed CBG schedule   6.  Right shoulder discomfort, likely mild rotator cuff syndrome.  Discussed changes in sleeping positions and provided patient with shoulder exercise program.  15 minutes of face to face patient care time was spent during this visit. All questions were encouraged and answered

## 2018-03-25 NOTE — Patient Instructions (Signed)
  CHECK YOUR SUGARS THROUGHOUT THE DAY:  FOR EXAMPLE: MONDAYS: IN AM TUESDAYS: BEFORE LUNCH WEDNESDAYS: BEFORE DINNER THURSDAYS AT BEDTIME.

## 2018-04-14 ENCOUNTER — Ambulatory Visit: Payer: Medicare Other | Admitting: Physical Medicine & Rehabilitation

## 2018-07-01 ENCOUNTER — Telehealth: Payer: Self-pay | Admitting: *Deleted

## 2018-07-01 ENCOUNTER — Encounter: Payer: Self-pay | Admitting: *Deleted

## 2018-07-01 NOTE — Telephone Encounter (Signed)
Spoke with daughter, [name], on [place] and informed her that due to current COVID 19 pandemic, our office is severely reducing in person visits in order to minimize the risk to our patients and healthcare providers. We recommend to convert patient's appointment to a video visit. We'll take all precautions to reduce any security or privacy concerns. This will be treated like an office visit, and we will file with your insurance. [name] stated she knew her mother would consent to video visit. She will assist her mother who is hard of hearing. Her e mail: [email or web address]. The patient has visit with PCP before appt with Dr Leta Baptist. [name] stated she will have PCP fax her med list to me. We updated remaining EMR. She  verbalized understanding, appreciation. E mail sent.

## 2018-07-06 ENCOUNTER — Ambulatory Visit (INDEPENDENT_AMBULATORY_CARE_PROVIDER_SITE_OTHER): Payer: Medicare Other | Admitting: Diagnostic Neuroimaging

## 2018-07-06 ENCOUNTER — Encounter: Payer: Self-pay | Admitting: Diagnostic Neuroimaging

## 2018-07-06 ENCOUNTER — Other Ambulatory Visit: Payer: Self-pay

## 2018-07-06 ENCOUNTER — Encounter

## 2018-07-06 DIAGNOSIS — R413 Other amnesia: Secondary | ICD-10-CM

## 2018-07-06 DIAGNOSIS — G40909 Epilepsy, unspecified, not intractable, without status epilepticus: Secondary | ICD-10-CM

## 2018-07-06 DIAGNOSIS — R269 Unspecified abnormalities of gait and mobility: Secondary | ICD-10-CM

## 2018-07-06 MED ORDER — LEVETIRACETAM 500 MG PO TABS: 500.0000 mg | ORAL_TABLET | Freq: Two times a day (BID) | ORAL | 12 refills | Status: AC

## 2018-07-06 NOTE — Progress Notes (Signed)
[facility]  PATIENT: [name] DOB: 1932-04-29  REFERRING CLINICIAN: anguilli HISTORY FROM: patient  REASON FOR VISIT: new consult    HISTORICAL  CHIEF COMPLAINT:  Chief Complaint  Patient presents with  . Seizures    HISTORY OF PRESENT ILLNESS:   85 year old female here for evaluation of seizure.  [place] was out of [place], stopped taking medication, and diabetes went out of control.  Patient returned to [place] and in December 2019 had one episode of slurred speech and confusion.  MRI in the emergency room was unremarkable.  Patient returned the next day with a seizure.  Blood sugar was quite elevated.  She was evaluated and treated.  She was intubated and treated with antiseizure medication due to status epilepticus.  Patient was managed medically.  Eventually she was extubated and went to rehabilitation.  She continued on antiseizure medication.  Since that time no further seizures.  Patient is taking her medications.  Patient has had some mild memory loss in the past, evaluated, thought to be mild cognitive impairment.  Patient lives alone.  She is not driving.  However she is quite focused on returning to driving.    REVIEW OF SYSTEMS: Full 14 system review of systems performed and negative with exception of: As per HPI.  ALLERGIES: Allergies  Allergen Reactions  . Ace Inhibitors Swelling    Angioedema   . Prednisone Nausea And Vomiting, Other (See Comments) and Hypertension    Blood sugar increased (also)  . Sulfa Antibiotics Hives  . Penicillins Rash    Has patient had a PCN reaction causing immediate rash, facial/tongue/throat swelling, SOB or lightheadedness with hypotension: Yes Has patient had a PCN reaction causing severe rash involving mucus membranes or skin necrosis: No Has patient had a PCN reaction that required hospitalization: No Has patient had a PCN reaction occurring within the last 10 years: No If all of the above  answers are "NO", then may proceed with Cephalosporin use.   . Sulfamethoxazole Rash  . Sulfonamide Derivatives Rash    HOME MEDICATIONS: Outpatient Medications Prior to Visit  Medication Sig Dispense Refill  . acetaminophen (TYLENOL) 325 MG tablet Take 2 tablets (650 mg total) by mouth every 6 (six) hours as needed for mild pain or fever (temp > 100.3).    Marland Kitchen Cholecalciferol (VITAMIN D3 PO) Take 1 tablet by mouth daily with lunch.    . cloNIDine (CATAPRES) 0.2 MG tablet Take 1 tablet (0.2 mg total) by mouth 2 (two) times daily. 60 tablet 3  . diltiazem (CARDIZEM) 30 MG tablet Take 1 tablet (30 mg total) by mouth every 8 (eight) hours. 180 tablet 1  . Insulin Detemir (LEVEMIR) 100 UNIT/ML Pen Inject 16 Units into the skin 2 (two) times daily. 15 mL 11  . IRON PO Take 325 mg by mouth daily with lunch.     . levETIRAcetam (KEPPRA) 500 MG tablet Take 1 tablet (500 mg total) by mouth 2 (two) times daily. 60 tablet 1  . Multiple Vitamin (MULTIVITAMIN WITH MINERALS) TABS tablet Take 1 tablet by mouth daily.    . pantoprazole (PROTONIX) 40 MG tablet Take 1 tablet (40 mg total) by mouth at bedtime. 30 tablet 0  . polycarbophil (FIBERCON) 625 MG tablet Take 625 mg by mouth daily as needed for mild constipation.    . polyethylene glycol (MIRALAX / GLYCOLAX) packet Take 17 g by mouth daily. 14 each 0  . Rivaroxaban (XARELTO) 15 MG TABS tablet Take 1 tablet (15  mg total) by mouth daily with supper. 42 tablet 3  . senna-docusate (SENOKOT-S) 8.6-50 MG tablet Take 2 tablets by mouth 2 (two) times daily.    . vitamin B-12 (CYANOCOBALAMIN) 500 MCG tablet Take 1 tablet (500 mcg total) by mouth daily with lunch. 30 tablet 0   No facility-administered medications prior to visit.     PAST MEDICAL HISTORY: Past Medical History:  Diagnosis Date  . A-fib [facility]) 2019   Per patients daughter, [name] [phone]  . ANEMIA, IRON DEFICIENCY 08/11/2008  . ANGIOEDEMA 03/01/2008  . CEREBROVASCULAR ACCIDENT, HX OF  03/01/2008  . CONTACT DERMATITIS&OTHER ECZEMA DUE UNSPEC CAUSE 11/28/2008  . DIABETES MELLITUS 03/01/2008  . Dysuria 01/04/2010  . GERD 03/01/2008  . HYPERSOMNIA 05/30/2009  . HYPERTENSION 03/01/2008  . INTERNAL HEMORRHOIDS 03/01/2008  . KNEE PAIN, RIGHT 09/05/2009  . NONSPECIFIC ABN FINDNG RAD&OTH EXAM BILARY TRCT 03/07/2008  . OBESITY 01/10/2010  . PERIPHERAL EDEMA 08/10/2008  . POLYP, GALLBLADDER 03/07/2008  . Seizures ([place]) 12/2017   w/encephalopathy  . SMALL BOWEL OBSTRUCTION, HX OF 03/01/2008  . SNORING 05/11/2009  . Stroke ([place]) 04/08/2016   TIA  . THROMBOCYTOSIS 08/18/2008    PAST SURGICAL HISTORY: Past Surgical History:  Procedure Laterality Date  . HERNIA REPAIR  09/14/08   with release [facility]-Dr. Dalbert Batman  . SMALL INTESTINE SURGERY    . 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 Diabetes Mother   . Heart disease Mother   . Diabetes Sister   . Heart disease Sister   . Colitis Sister   . Kidney disease Brother   . Breast cancer Other        Neice  . Allergies Other        Nephrew  . Asthma Other        Nephrew    SOCIAL HISTORY: Social History   Socioeconomic History  . Marital status: Divorced    Spouse name: Not on file  . Number of children: 5  . Years of education: 61  . Highest education level: Not on file  Occupational History    Comment: counselor  Social Needs  . Financial resource strain: Not on file  . Food insecurity    Worry: Not on file    Inability: Not on file  . Transportation needs    Medical: Not on file    Non-medical: Not on file  Tobacco Use  . Smoking status: Never Smoker  . Smokeless tobacco: Never Used  Substance and Sexual Activity  . Alcohol use: No  . Drug use: No  . Sexual activity: Not Currently  Lifestyle  . Physical activity    Days per week: Not on file    Minutes per session: Not on file  . Stress: Not on file   Relationships  . Social Herbalist on phone: Not on file    Gets together: Not on file    Attends religious service: Not on file    Active member of club or organization: Not on file    Attends meetings of clubs or organizations: Not on file    Relationship status: Not on file  . Intimate partner violence    Fear of current or ex partner: Not on file    Emotionally abused: Not on file    Physically abused: Not on file    Forced sexual activity:  Not on file  Other Topics Concern  . Not on file  Social History Narrative   07/01/18 Lives alone in apartment, dgtr [place] lives near     [place] EXAM/CONSTITUTIONAL:  Vitals: There were no vitals filed for this visit.  There is no height or weight on file to calculate BMI. Wt Readings from Last 3 Encounters:  03/25/18 198 lb 12.8 oz (90.2 kg)  02/05/18 192 lb (87.1 kg)  01/13/18 190 lb 4.9 oz (86.3 kg)     Patient is in no distress; well developed, nourished and groomed; neck is supple  NEUROLOGIC: MENTAL STATUS:  MMSE - Mini Mental State Exam 07/06/2018 09/04/2016  Orientation to time 5 5  Orientation to time comments june, 15, 2020, monday, summer -  Orientation to Place 5 4  Orientation to [place], Solicitor, [place], daughter living room,  -  Registration 3 3  Attention/ Calculation 1 5  Attention/Calculation-comments 93, 84, 76, 79, 82 -  Recall 1 3  Recall-comments apple,  -  Language- name 2 objects 2 2  Language- repeat 1 1  Language- follow 3 step command 3 3  Language- read & follow direction 1 1  Write a sentence 1 1  Copy design 0 1  Total score 23 29    awake, alert, oriented to person, place and time  DECR MEMORY  DECR attention and concentration  language fluent, comprehension intact, naming intact  fund of knowledge appropriate  CRANIAL NERVE:   2nd, 3rd, 4th, 6th - visual fields full to confrontation, extraocular muscles  intact, no nystagmus  5th - facial sensation symmetric  7th - facial strength symmetric  8th - hearing intact  11th - shoulder shrug symmetric  12th - tongue protrusion midline  MOTOR:   NO TREMOR; NO DRIFT IN BUE  SENSORY:   normal and symmetric to light touch  COORDINATION:   fine finger movements normal    DIAGNOSTIC DATA (LABS, IMAGING, TESTING) - I reviewed patient records, labs, notes, testing and imaging myself where available.  Lab Results  Component Value Date   WBC 10.7 (H) 01/12/2018   HGB 11.2 (L) 01/12/2018   HCT 35.2 (L) 01/12/2018   MCV 83.0 01/12/2018   PLT 468 (H) 01/12/2018      Component Value Date/Time   NA 139 01/12/2018 0549   K 4.1 01/12/2018 0549   CL 104 01/12/2018 0549   CO2 27 01/12/2018 0549   GLUCOSE 71 01/12/2018 0549   BUN 30 (H) 01/12/2018 0549   CREATININE 1.44 (H) 01/12/2018 0549   CALCIUM 9.2 01/12/2018 0549   PROT 6.3 (L) 01/12/2018 0549   ALBUMIN 2.8 (L) 01/12/2018 0549   AST 16 01/12/2018 0549   ALT 17 01/12/2018 0549   ALKPHOS 52 01/12/2018 0549   BILITOT 0.5 01/12/2018 0549   GFRNONAA 33 (L) 01/12/2018 0549   GFRAA 39 (L) 01/12/2018 0549   Lab Results  Component Value Date   CHOL 128 07/26/2017   HDL 45 07/26/2017   LDLCALC 63 07/26/2017   TRIG 96 01/04/2018   CHOLHDL 2.8 07/26/2017   Lab Results  Component Value Date   HGBA1C 13.0 (H) 01/01/2018   Lab Results  Component Value Date   VITAMINB12 303 08/10/2008   Lab Results  Component Value Date   TSH 1.646 07/25/2017    01/03/18 MRI brain - Advanced changes of atrophy and small vessel disease with chronic microhemorrhages and scattered lacunar infarcts. Stable exam from  12/31/2017.  - No acute intracranial abnormality. - No features to suggest interval development of PRES.  01/01/18 EEG IMPRESSION: This is an abnormal electroencephalogram secondary to a burst suppression rhythm that is consistent with the patient's medications.    01/02/18  EEG Impression: This EEG shows evidence of a severe diffuse encephalopathy. No epileptiform discharges or EEG seizures were recorded.    ASSESSMENT AND PLAN  85 y.o. year old female here with status epilepticus in the setting of hyperglycemia in December 2019.  Dx:  1. Seizure disorder ([place])   2. Memory loss   3. Gait disorder    Virtual Visit via Video Note  I connected with DALYNN JHAVERI on 07/06/18 at  4:00 PM EDT by a video enabled telemedicine application and verified that I am speaking with the correct person using two identifiers.  Location: Patient: home Provider: office   I discussed the limitations of evaluation and management by telemedicine and the availability of in person appointments. The patient expressed understanding and agreed to proceed.  I discussed the assessment and treatment plan with the patient. The patient was provided an opportunity to ask questions and all were answered. The patient agreed with the plan and demonstrated an understanding of the instructions.   The patient was advised to call back or seek an in-person evaluation if the symptoms worsen or if the condition fails to improve as anticipated.  I provided 45 minutes of non-face-to-face time during this encounter.   PLAN:  SEIZURE DISORDER (status epilpeticus in setting of hyperglycemia) - continue levetiracetam 500mg  twice a day; will continue long term due to status epilepticus in Dec 2019 - ok to return to drive from seizure restriction standpoint, but not likely a good idea to return to driving due to other reasons (multiple medical conditions, memory loss, gait difficulty  Meds ordered this encounter  Medications  . levETIRAcetam (KEPPRA) 500 MG tablet    Sig: Take 1 tablet (500 mg total) by mouth 2 (two) times daily.    Dispense:  60 tablet    Refill:  12   MEMORY LOSS (MMSE 23/30) - MCI vs mild dementia - increase safety and supervision  DRIVING SAFETY - I recommend patient  transition away from driving due to multiple medical conditions, memory loss, hearing loss, age, frailty, gait difficulty  Return in about 1 year (around 07/06/2019) for with NP (Amy Lomax).    Penni Bombard, MD 08/23/2334, 1:22 PM Certified in Neurology, Neurophysiology and Neuroimaging  [facility] [address] [place], [place] [address] [phone]

## 2018-07-23 ENCOUNTER — Telehealth: Payer: Self-pay | Admitting: *Deleted

## 2018-07-23 NOTE — Telephone Encounter (Signed)
LVM for daughter, [place] on [place] advising her I was calling to schedule her one year FU with A Lomax, NP. Left office number for call back.

## 2018-08-17 ENCOUNTER — Other Ambulatory Visit: Payer: Self-pay

## 2018-08-17 DIAGNOSIS — Z20822 Contact with and (suspected) exposure to covid-19: Secondary | ICD-10-CM

## 2018-08-19 LAB — NOVEL CORONAVIRUS, NAA: SARS-CoV-2, NAA: NOT DETECTED

## 2018-08-26 ENCOUNTER — Telehealth: Payer: Self-pay | Admitting: Diagnostic Neuroimaging

## 2018-08-26 NOTE — Telephone Encounter (Signed)
Pt is needing to speak to RN regarding her being able to drive again. Please advise.

## 2018-08-26 NOTE — Telephone Encounter (Signed)
Called patient who stated she renewed her DL, drives short distances to bank or grocery store. Her nephew is usually with her, but he doesn't drive. Her daughter is working two jobs so is unable to help her very often. She asked for a letter from Dr Leta Baptist stating she can drive. I informed her at her virtual visit in June he advised she may drive from a seizure standpoint, but he recommended she transition away from driving. He advised that based on her medical conditions and memory issues.  Therefore he would not provide a letter because he wants her to be safe and driving is not his recommendation. I advised she have her nephew with her if or when she does drive, so she won't get lost. I advised she ask friends, family, neighbors to assist. She stated "they' re as old as I am. I am independent."  I advised she continue taking levetiracetam as ordered and call for any problems or questions. She verbalized understanding, appreciation.

## 2018-09-25 ENCOUNTER — Ambulatory Visit (HOSPITAL_COMMUNITY)
Admission: EM | Admit: 2018-09-25 | Discharge: 2018-09-25 | Disposition: A | Discharge status: Home / Self Care | Payer: Medicare Other | Attending: Family Medicine | Admitting: Family Medicine

## 2018-09-25 ENCOUNTER — Other Ambulatory Visit: Payer: Self-pay

## 2018-09-25 ENCOUNTER — Encounter (HOSPITAL_COMMUNITY): Payer: Self-pay

## 2018-09-25 DIAGNOSIS — L0291 Cutaneous abscess, unspecified: Secondary | ICD-10-CM

## 2018-09-25 DIAGNOSIS — B356 Tinea cruris: Secondary | ICD-10-CM

## 2018-09-25 MED ORDER — CEPHALEXIN 500 MG PO CAPS: 500.0000 mg | ORAL_CAPSULE | Freq: Four times a day (QID) | ORAL | 0 refills | Status: DC

## 2018-09-25 MED ORDER — NYSTATIN 100000 UNIT/GM EX CREA: TOPICAL_CREAM | CUTANEOUS | 1 refills | Status: DC

## 2018-09-25 NOTE — ED Provider Notes (Signed)
[place]    CSN: [id number] Arrival date & time: 09/25/18  1344      History   Chief Complaint Chief Complaint  Patient presents with  . Abscess    HPI [name] is a 85 y.o. female.   Patient is a 85 year old female who presents today with abscess and draining of the perineal area.  This is been present and worsening of the past 3 days.  Reporting area is painful with sitting and walking.  She is also had some mild itching in the perineal area.  She has been using boil ease and warm compresses on the area.  Keeping covered with dressings and large pads.  Denies any associated fever, chills, body aches, nausea, vomiting.  ROS per HPI      Past Medical History:  Diagnosis Date  . A-fib [facility]) 2019   Per patients daughter, [name] [phone]  . ANEMIA, IRON DEFICIENCY 08/11/2008  . ANGIOEDEMA 03/01/2008  . CEREBROVASCULAR ACCIDENT, HX OF 03/01/2008  . CONTACT DERMATITIS&OTHER ECZEMA DUE UNSPEC CAUSE 11/28/2008  . DIABETES MELLITUS 03/01/2008  . Dysuria 01/04/2010  . GERD 03/01/2008  . HYPERSOMNIA 05/30/2009  . HYPERTENSION 03/01/2008  . INTERNAL HEMORRHOIDS 03/01/2008  . KNEE PAIN, RIGHT 09/05/2009  . NONSPECIFIC ABN FINDNG RAD&OTH EXAM BILARY TRCT 03/07/2008  . OBESITY 01/10/2010  . PERIPHERAL EDEMA 08/10/2008  . POLYP, GALLBLADDER 03/07/2008  . Seizures ([place]) 12/2017   w/encephalopathy  . SMALL BOWEL OBSTRUCTION, HX OF 03/01/2008  . SNORING 05/11/2009  . Stroke ([place]) 04/08/2016   TIA  . THROMBOCYTOSIS 08/18/2008    Patient Active Problem List   Diagnosis Date Noted  . Gait disorder 03/25/2018  . Hypoalbuminemia due to protein-calorie malnutrition ([place])   . AKI (acute kidney injury) ([place])   . CKD (chronic kidney disease), stage III ([place])   . Acute blood loss anemia   . Leukocytosis   . Poorly controlled type 2 diabetes mellitus with peripheral neuropathy ([place])   . Labile blood glucose   . Hypoglycemia   . Labile blood pressure   . Acute encephalopathy  01/09/2018  . PAF (paroxysmal atrial fibrillation) ([place])   . Type 2 diabetes mellitus with peripheral neuropathy ([facility])   . Morbid obesity ([place])   . Status epilepticus ([place]) 01/01/2018  . Accelerated hypertension 01/01/2018  . Acute kidney injury ([place]) 01/01/2018  . Type 2 diabetes mellitus ([place]) 01/01/2018  . Respiratory failure ([place])   . Hyperkalemia 07/25/2017  . Bradycardia 07/25/2017  . Hypotension 07/25/2017  . History of stroke 07/25/2017  . ARF (acute renal failure) ([place]) 07/25/2017  . TIA (transient ischemic attack) 04/08/2016  . Hypocalcemia 04/08/2016  . OBESITY 01/10/2010  . DYSURIA 01/04/2010  . KNEE PAIN, RIGHT 09/05/2009  . HYPERSOMNIA 05/30/2009  . URI 05/11/2009  . SNORING 05/11/2009  . CONTACT DERMATITIS&OTHER ECZEMA DUE UNSPEC CAUSE 11/28/2008  . THROMBOCYTOSIS 08/18/2008  . ANEMIA, IRON DEFICIENCY 08/11/2008  . PERIPHERAL EDEMA 08/10/2008  . POLYP, GALLBLADDER 03/07/2008  . NONSPECIFIC ABN FINDNG RAD&OTH EXAM BILARY TRCT 03/07/2008  . Diabetes mellitus type II, uncontrolled ([place]) 03/01/2008  . Essential hypertension 03/01/2008  . INTERNAL HEMORRHOIDS 03/01/2008  . GERD 03/01/2008  . ANGIOEDEMA 03/01/2008  . CEREBROVASCULAR ACCIDENT, HX OF 03/01/2008  . SMALL BOWEL OBSTRUCTION, HX OF 03/01/2008    Past Surgical History:  Procedure Laterality Date  . HERNIA REPAIR  09/14/08   with release [facility]-Dr. Dalbert Batman  . SMALL INTESTINE SURGERY    . 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      Home Medications    Prior to Admission medications   Medication Sig Start Date End Date Taking? Authorizing Provider  acetaminophen (TYLENOL) 325 MG tablet Take 2 tablets (650 mg total) by mouth every 6 (six) hours as needed for mild pain or fever (temp > 100.3). 01/12/18   Angiulli, Lavon Paganini, PA-C  cephALEXin (KEFLEX) 500 MG capsule Take 1 capsule (500 mg total) by mouth 4 (four)  times daily. 09/25/18   Loura Halt A, NP  Cholecalciferol (VITAMIN D3 PO) Take 1 tablet by mouth daily with lunch.    [provider]  cloNIDine (CATAPRES) 0.2 MG tablet Take 1 tablet (0.2 mg total) by mouth 2 (two) times daily. 01/12/18   Angiulli, Lavon Paganini, PA-C  diltiazem (CARDIZEM) 30 MG tablet Take 1 tablet (30 mg total) by mouth every 8 (eight) hours. 01/12/18   Angiulli, Lavon Paganini, PA-C  Insulin Detemir (LEVEMIR) 100 UNIT/ML Pen Inject 16 Units into the skin 2 (two) times daily. 01/12/18   Angiulli, Lavon Paganini, PA-C  IRON PO Take 325 mg by mouth daily with lunch.     [provider]  levETIRAcetam (KEPPRA) 500 MG tablet Take 1 tablet (500 mg total) by mouth 2 (two) times daily. 07/06/18   Penumalli, Earlean Polka, MD  Multiple Vitamin (MULTIVITAMIN WITH MINERALS) TABS tablet Take 1 tablet by mouth daily.    [provider]  nystatin cream (MYCOSTATIN) Apply to affected area 2 times daily 09/25/18   Loura Halt A, NP  pantoprazole (PROTONIX) 40 MG tablet Take 1 tablet (40 mg total) by mouth at bedtime. 01/12/18   Angiulli, Lavon Paganini, PA-C  polycarbophil (FIBERCON) 625 MG tablet Take 625 mg by mouth daily as needed for mild constipation.    [provider]  polyethylene glycol (MIRALAX / GLYCOLAX) packet Take 17 g by mouth daily. 01/10/18   Elgergawy, Silver Huguenin, MD  Rivaroxaban (XARELTO) 15 MG TABS tablet Take 1 tablet (15 mg total) by mouth daily with supper. 01/12/18   Angiulli, Lavon Paganini, PA-C  senna-docusate (SENOKOT-S) 8.6-50 MG tablet Take 2 tablets by mouth 2 (two) times daily. 01/09/18   Elgergawy, Silver Huguenin, MD  vitamin B-12 (CYANOCOBALAMIN) 500 MCG tablet Take 1 tablet (500 mcg total) by mouth daily with lunch. 01/12/18   Angiulli, Lavon Paganini, PA-C    Family History Family History  Problem Relation Age of Onset  . Diabetes Mother   . Heart disease Mother   . Diabetes Sister   . Heart disease Sister   . Colitis Sister   . Kidney disease Brother   . Breast  cancer Other        Neice  . Allergies Other        Nephrew  . Asthma Other        Nephrew    Social History Social History   Tobacco Use  . Smoking status: Never Smoker  . Smokeless tobacco: Never Used  Substance Use Topics  . Alcohol use: No  . Drug use: No     Allergies   Ace inhibitors, Prednisone, Sulfa antibiotics, Penicillins, Sulfamethoxazole, and Sulfonamide derivatives   Review of Systems Review of Systems   Physical Exam Triage Vital Signs ED Triage Vitals  Enc Vitals Group     BP 09/25/18 1358 137/60     Pulse Rate 09/25/18 1358 76     Resp 09/25/18  1358 16     Temp 09/25/18 1358 98.5 F (36.9 C)     Temp Source 09/25/18 1358 Oral     SpO2 09/25/18 1358 (!) 78 %     Weight --      Height --      Head Circumference --      Peak Flow --      Pain Score 09/25/18 1357 6     Pain Loc --      Pain Edu? --      Excl. in [place]? --    No data found.  Updated Vital Signs BP 137/60 (BP Location: Right Wrist)   Pulse 76   Temp 98.5 F (36.9 C) (Oral)   Resp 16   SpO2 (!) 78%   Visual Acuity Right Eye Distance:   Left Eye Distance:   Bilateral Distance:    Right Eye Near:   Left Eye Near:    Bilateral Near:     Physical Exam Vitals signs and nursing note reviewed.  Constitutional:      General: She is not in acute distress.    Appearance: Normal appearance. She is not ill-appearing, toxic-appearing or diaphoretic.  HENT:     Head: Normocephalic.     Nose: Nose normal.     Mouth/Throat:     Pharynx: Oropharynx is clear.  Eyes:     Conjunctiva/sclera: Conjunctivae normal.  Neck:     Musculoskeletal: Normal range of motion.  Pulmonary:     Effort: Pulmonary effort is normal.  Abdominal:     Palpations: Abdomen is soft.     Tenderness: There is no abdominal tenderness.  Genitourinary:    Pubic Area: Rash present.       Comments: Fungal rash noted to pubic area.  1 cm abscess to left labia majora, bleeding and draining.   Musculoskeletal: Normal range of motion.  Skin:    General: Skin is warm and dry.     Findings: No rash.  Neurological:     Mental Status: She is alert.  Psychiatric:        Mood and Affect: Mood normal.      UC Treatments / Results  Labs (all labs ordered are listed, but only abnormal results are displayed) Labs Reviewed - No data to display  EKG   Radiology No results found.  Procedures Procedures (including critical care time)  Medications Ordered in UC Medications - No data to display  Initial Impression / Assessment and Plan / UC Course  I have reviewed the triage vital signs and the nursing notes.  Pertinent labs & imaging results that were available during my care of the patient were reviewed by me and considered in my medical decision making (see chart for details).     Abscess to left labia majora, draining and bleeding.  Treating with keflex. Recommended keep area dry and clean as possible.   Fungal infection to groin area. Treating with nystatin.   Follow up as needed for continued or worsening symptoms  Final Clinical Impressions(s) / UC Diagnoses   Final diagnoses:  Abscess  Fungal infection of the groin     Discharge Instructions     Take the medication as prescribed. Follow up as needed for continued or worsening symptoms     ED Prescriptions    Medication Sig Dispense Auth. Provider   nystatin cream (MYCOSTATIN) Apply to affected area 2 times daily 30 g Caelan Atchley A, NP   cephALEXin (KEFLEX) 500 MG capsule Take  1 capsule (500 mg total) by mouth 4 (four) times daily. 28 capsule Loura Halt A, NP     Controlled Substance Prescriptions [place] Controlled Substance Registry consulted? Not Applicable   Orvan July, NP 09/25/18 1551

## 2018-09-25 NOTE — Discharge Instructions (Addendum)
Take the medication as prescribed °Follow up as needed for continued or worsening symptoms ° °

## 2018-09-25 NOTE — ED Triage Notes (Signed)
Patient presents to Urgent Care with complaints of draining abscess in her perinium since the past few days. Patient reports the abscess is draining and is painful.

## 2018-10-02 ENCOUNTER — Emergency Department (HOSPITAL_COMMUNITY)
Admission: EM | Admit: 2018-10-02 | Discharge: 2018-10-02 | Disposition: A | Discharge status: Home / Self Care | Payer: Medicare Other | Attending: Emergency Medicine | Admitting: Emergency Medicine

## 2018-10-02 ENCOUNTER — Emergency Department (HOSPITAL_COMMUNITY): Payer: Medicare Other

## 2018-10-02 DIAGNOSIS — Z7901 Long term (current) use of anticoagulants: Secondary | ICD-10-CM | POA: Diagnosis not present

## 2018-10-02 DIAGNOSIS — N183 Chronic kidney disease, stage 3 (moderate): Secondary | ICD-10-CM | POA: Diagnosis not present

## 2018-10-02 DIAGNOSIS — Z8673 Personal history of transient ischemic attack (TIA), and cerebral infarction without residual deficits: Secondary | ICD-10-CM | POA: Diagnosis not present

## 2018-10-02 DIAGNOSIS — Z794 Long term (current) use of insulin: Secondary | ICD-10-CM | POA: Diagnosis not present

## 2018-10-02 DIAGNOSIS — R4182 Altered mental status, unspecified: Secondary | ICD-10-CM | POA: Diagnosis not present

## 2018-10-02 DIAGNOSIS — I129 Hypertensive chronic kidney disease with stage 1 through stage 4 chronic kidney disease, or unspecified chronic kidney disease: Secondary | ICD-10-CM | POA: Diagnosis not present

## 2018-10-02 DIAGNOSIS — E1122 Type 2 diabetes mellitus with diabetic chronic kidney disease: Secondary | ICD-10-CM | POA: Insufficient documentation

## 2018-10-02 DIAGNOSIS — R739 Hyperglycemia, unspecified: Secondary | ICD-10-CM

## 2018-10-02 DIAGNOSIS — Z79899 Other long term (current) drug therapy: Secondary | ICD-10-CM | POA: Insufficient documentation

## 2018-10-02 DIAGNOSIS — R479 Unspecified speech disturbances: Secondary | ICD-10-CM | POA: Diagnosis present

## 2018-10-02 DIAGNOSIS — R51 Headache: Secondary | ICD-10-CM | POA: Insufficient documentation

## 2018-10-02 DIAGNOSIS — E1140 Type 2 diabetes mellitus with diabetic neuropathy, unspecified: Secondary | ICD-10-CM | POA: Diagnosis not present

## 2018-10-02 DIAGNOSIS — E1165 Type 2 diabetes mellitus with hyperglycemia: Secondary | ICD-10-CM | POA: Diagnosis not present

## 2018-10-02 LAB — CBG MONITORING, ED
Glucose-Capillary: 528 mg/dL (ref 70–99)
Glucose-Capillary: 480 mg/dL — ABNORMAL HIGH (ref 70–99)
Glucose-Capillary: 417 mg/dL — ABNORMAL HIGH (ref 70–99)
Glucose-Capillary: 258 mg/dL — ABNORMAL HIGH (ref 70–99)

## 2018-10-02 LAB — COMPREHENSIVE METABOLIC PANEL
ALT: 21 U/L (ref 0–44)
AST: 20 U/L (ref 15–41)
Albumin: 3.4 g/dL — ABNORMAL LOW (ref 3.5–5.0)
Alkaline Phosphatase: 93 U/L (ref 38–126)
Anion gap: 10 (ref 5–15)
BUN: 22 mg/dL (ref 8–23)
CO2: 23 mmol/L (ref 22–32)
Calcium: 9.1 mg/dL (ref 8.9–10.3)
Chloride: 99 mmol/L (ref 98–111)
Creatinine, Ser: 1.28 mg/dL — ABNORMAL HIGH (ref 0.44–1.00)
GFR calc Af Amer: 45 mL/min — ABNORMAL LOW (ref >60)
GFR calc non Af Amer: 39 mL/min — ABNORMAL LOW (ref >60)
Glucose, Bld: 548 mg/dL (ref 70–99)
Potassium: 4.5 mmol/L (ref 3.5–5.1)
Sodium: 132 mmol/L — ABNORMAL LOW (ref 135–145)
Total Bilirubin: 0.4 mg/dL (ref 0.3–1.2)
Total Protein: 7.4 g/dL (ref 6.5–8.1)

## 2018-10-02 LAB — DIFFERENTIAL
Abs Immature Granulocytes: 0.02 10*3/uL (ref 0.00–0.07)
Basophils Absolute: 0.1 10*3/uL (ref 0.0–0.1)
Basophils Relative: 1 %
Eosinophils Absolute: 0.3 10*3/uL (ref 0.0–0.5)
Eosinophils Relative: 4 %
Immature Granulocytes: 0 %
Lymphocytes Relative: 45 %
Lymphs Abs: 3.6 10*3/uL (ref 0.7–4.0)
Monocytes Absolute: 0.8 10*3/uL (ref 0.1–1.0)
Monocytes Relative: 10 %
Neutro Abs: 3.3 10*3/uL (ref 1.7–7.7)
Neutrophils Relative %: 40 %

## 2018-10-02 LAB — CBC
HCT: 41.4 % (ref 36.0–46.0)
Hemoglobin: 13.4 g/dL (ref 12.0–15.0)
MCH: 27 pg (ref 26.0–34.0)
MCHC: 32.4 g/dL (ref 30.0–36.0)
MCV: 83.5 fL (ref 80.0–100.0)
Platelets: 322 10*3/uL (ref 150–400)
RBC: 4.96 MIL/uL (ref 3.87–5.11)
RDW: 15.4 % (ref 11.5–15.5)
WBC: 8.1 10*3/uL (ref 4.0–10.5)
nRBC: 0 % (ref 0.0–0.2)

## 2018-10-02 LAB — I-STAT CHEM 8, ED
BUN: 28 mg/dL — ABNORMAL HIGH (ref 8–23)
Calcium, Ion: 0.96 mmol/L — ABNORMAL LOW (ref 1.15–1.40)
Chloride: 101 mmol/L (ref 98–111)
Creatinine, Ser: 1.2 mg/dL — ABNORMAL HIGH (ref 0.44–1.00)
Glucose, Bld: 557 mg/dL (ref 70–99)
HCT: 43 % (ref 36.0–46.0)
Hemoglobin: 14.6 g/dL (ref 12.0–15.0)
Potassium: 4.7 mmol/L (ref 3.5–5.1)
Sodium: 131 mmol/L — ABNORMAL LOW (ref 135–145)
TCO2: 27 mmol/L (ref 22–32)

## 2018-10-02 LAB — PROTIME-INR
INR: 2.1 — ABNORMAL HIGH (ref 0.8–1.2)
Prothrombin Time: 22.9 seconds — ABNORMAL HIGH (ref 11.4–15.2)

## 2018-10-02 LAB — APTT: aPTT: 31 seconds (ref 24–36)

## 2018-10-02 MED ORDER — SODIUM CHLORIDE 0.9 % IV BOLUS
1000.0000 mL | Freq: Once | INTRAVENOUS | Status: AC
  Administered 2018-10-02: 1000 mL via INTRAVENOUS

## 2018-10-02 MED ORDER — SODIUM CHLORIDE 0.9% FLUSH: 3.0000 mL | Freq: Once | INTRAVENOUS | Status: DC

## 2018-10-02 MED ORDER — INSULIN ASPART 100 UNIT/ML ~~LOC~~ SOLN
5.0000 [IU] | Freq: Once | SUBCUTANEOUS | Status: AC
  Administered 2018-10-02: 5 [IU] via SUBCUTANEOUS

## 2018-10-02 NOTE — ED Triage Notes (Signed)
Came in POV ; daughter [place] at bedside. Daughter stated she noticed slurred speech at 10 pm last night.  C/O worsening confusion along w/ slurring some words. C/o high blood sugar as well.

## 2018-10-02 NOTE — ED Notes (Signed)
I stat chem 8 was processed, Glu reading was @557mg /dl

## 2018-10-02 NOTE — ED Provider Notes (Signed)
[place] EMERGENCY DEPARTMENT Provider Note   CSN: [id number] Arrival date & time: 10/02/18  0207     History   Chief Complaint Chief Complaint  Patient presents with  . Aphasia  . Altered Mental Status    HPI [name] is a 85 y.o. female.  Presents emerge department chief complaint brief episode of some difficulty speaking.  Daughter reports that last night around 10 PM for a few seconds, she had a hard time understanding her words, seemed to be slurring her speech.  Resolved after a few seconds, headache complete resolution has not had any subsequent episodes since that time.  Patient denies any associated numbness, weakness, vision changes, difficulty walking.  No chest pain.  No fever.  States recently diagnosed with possible abscess in her groin and was discharged with antifungal cream and cephalexin.  States she has been taking this medication and the area of concern has improved significantly.     HPI  Past Medical History:  Diagnosis Date  . A-fib [facility]) 2019   Per patients daughter, [name] [phone]  . ANEMIA, IRON DEFICIENCY 08/11/2008  . ANGIOEDEMA 03/01/2008  . CEREBROVASCULAR ACCIDENT, HX OF 03/01/2008  . CONTACT DERMATITIS&OTHER ECZEMA DUE UNSPEC CAUSE 11/28/2008  . DIABETES MELLITUS 03/01/2008  . Dysuria 01/04/2010  . GERD 03/01/2008  . HYPERSOMNIA 05/30/2009  . HYPERTENSION 03/01/2008  . INTERNAL HEMORRHOIDS 03/01/2008  . KNEE PAIN, RIGHT 09/05/2009  . NONSPECIFIC ABN FINDNG RAD&OTH EXAM BILARY TRCT 03/07/2008  . OBESITY 01/10/2010  . PERIPHERAL EDEMA 08/10/2008  . POLYP, GALLBLADDER 03/07/2008  . Seizures ([place]) 12/2017   w/encephalopathy  . SMALL BOWEL OBSTRUCTION, HX OF 03/01/2008  . SNORING 05/11/2009  . Stroke ([place]) 04/08/2016   TIA  . THROMBOCYTOSIS 08/18/2008    Patient Active Problem List   Diagnosis Date Noted  . Gait disorder 03/25/2018  . Hypoalbuminemia due to protein-calorie malnutrition ([address])   . AKI (acute kidney injury)  ([place])   . CKD (chronic kidney disease), stage III ([place])   . Acute blood loss anemia   . Leukocytosis   . Poorly controlled type 2 diabetes mellitus with peripheral neuropathy ([place])   . Labile blood glucose   . Hypoglycemia   . Labile blood pressure   . Acute encephalopathy 01/09/2018  . PAF (paroxysmal atrial fibrillation) ([place])   . Type 2 diabetes mellitus with peripheral neuropathy ([facility])   . Morbid obesity ([place])   . Status epilepticus ([place]) 01/01/2018  . Accelerated hypertension 01/01/2018  . Acute kidney injury ([place]) 01/01/2018  . Type 2 diabetes mellitus ([place]) 01/01/2018  . Respiratory failure ([place])   . Hyperkalemia 07/25/2017  . Bradycardia 07/25/2017  . Hypotension 07/25/2017  . History of stroke 07/25/2017  . ARF (acute renal failure) ([place]) 07/25/2017  . TIA (transient ischemic attack) 04/08/2016  . Hypocalcemia 04/08/2016  . OBESITY 01/10/2010  . DYSURIA 01/04/2010  . KNEE PAIN, RIGHT 09/05/2009  . HYPERSOMNIA 05/30/2009  . URI 05/11/2009  . SNORING 05/11/2009  . CONTACT DERMATITIS&OTHER ECZEMA DUE UNSPEC CAUSE 11/28/2008  . THROMBOCYTOSIS 08/18/2008  . ANEMIA, IRON DEFICIENCY 08/11/2008  . PERIPHERAL EDEMA 08/10/2008  . POLYP, GALLBLADDER 03/07/2008  . NONSPECIFIC ABN FINDNG RAD&OTH EXAM BILARY TRCT 03/07/2008  . Diabetes mellitus type II, uncontrolled ([place]) 03/01/2008  . Essential hypertension 03/01/2008  . INTERNAL HEMORRHOIDS 03/01/2008  . GERD 03/01/2008  . ANGIOEDEMA 03/01/2008  . CEREBROVASCULAR ACCIDENT, HX OF 03/01/2008  . SMALL BOWEL OBSTRUCTION, HX OF 03/01/2008    Past Surgical History:  Procedure Laterality Date  . HERNIA REPAIR  09/14/08   with release [facility]-Dr. Dalbert Batman  . SMALL INTESTINE SURGERY    . 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      Home Medications    Prior to Admission medications   Medication Sig Start Date End Date Taking?  Authorizing Provider  acetaminophen (TYLENOL) 325 MG tablet Take 2 tablets (650 mg total) by mouth every 6 (six) hours as needed for mild pain or fever (temp > 100.3). 01/12/18  Yes Angiulli, Lavon Paganini, PA-C  cephALEXin (KEFLEX) 500 MG capsule Take 1 capsule (500 mg total) by mouth 4 (four) times daily. 09/25/18  Yes Bast, Traci A, NP  Cholecalciferol (VITAMIN D3 PO) Take 1 tablet by mouth daily with lunch.   Yes [provider]  cloNIDine (CATAPRES) 0.2 MG tablet Take 1 tablet (0.2 mg total) by mouth 2 (two) times daily. 01/12/18  Yes Angiulli, Lavon Paganini, PA-C  diltiazem (CARDIZEM) 30 MG tablet Take 1 tablet (30 mg total) by mouth every 8 (eight) hours. 01/12/18  Yes Angiulli, Lavon Paganini, PA-C  Insulin Detemir (LEVEMIR) 100 UNIT/ML Pen Inject 16 Units into the skin 2 (two) times daily. 01/12/18  Yes Angiulli, Lavon Paganini, PA-C  IRON PO Take 325 mg by mouth daily with lunch.    Yes [provider]  levETIRAcetam (KEPPRA) 500 MG tablet Take 1 tablet (500 mg total) by mouth 2 (two) times daily. 07/06/18  Yes Penumalli, Earlean Polka, MD  Multiple Vitamin (MULTIVITAMIN WITH MINERALS) TABS tablet Take 1 tablet by mouth daily.   Yes [provider]  nystatin cream (MYCOSTATIN) Apply to affected area 2 times daily 09/25/18  Yes Bast, Traci A, NP  pantoprazole (PROTONIX) 40 MG tablet Take 1 tablet (40 mg total) by mouth at bedtime. 01/12/18  Yes Angiulli, Lavon Paganini, PA-C  polycarbophil (FIBERCON) 625 MG tablet Take 625 mg by mouth daily as needed for mild constipation.   Yes [provider]  polyethylene glycol (MIRALAX / GLYCOLAX) packet Take 17 g by mouth daily. 01/10/18  Yes Elgergawy, Silver Huguenin, MD  Rivaroxaban (XARELTO) 15 MG TABS tablet Take 1 tablet (15 mg total) by mouth daily with supper. 01/12/18  Yes Angiulli, Lavon Paganini, PA-C  senna-docusate (SENOKOT-S) 8.6-50 MG tablet Take 2 tablets by mouth 2 (two) times daily. 01/09/18  Yes Elgergawy, Silver Huguenin, MD  vitamin B-12  (CYANOCOBALAMIN) 500 MCG tablet Take 1 tablet (500 mcg total) by mouth daily with lunch. 01/12/18  Yes Angiulli, Lavon Paganini, PA-C    Family History Family History  Problem Relation Age of Onset  . Diabetes Mother   . Heart disease Mother   . Diabetes Sister   . Heart disease Sister   . Colitis Sister   . Kidney disease Brother   . Breast cancer Other        Neice  . Allergies Other        Nephrew  . Asthma Other        Nephrew    Social History Social History   Tobacco Use  . Smoking status: Never Smoker  . Smokeless tobacco: Never Used  Substance Use Topics  . Alcohol use: No  . Drug use: No     Allergies   Ace inhibitors, Prednisone, Sulfa antibiotics, Penicillins, Sulfamethoxazole, and Sulfonamide derivatives   Review of Systems Review of Systems  Constitutional: Negative for  chills and fever.  HENT: Negative for ear pain and sore throat.   Eyes: Negative for pain and visual disturbance.  Respiratory: Negative for cough and shortness of breath.   Cardiovascular: Negative for chest pain and palpitations.  Gastrointestinal: Negative for abdominal pain and vomiting.  Genitourinary: Negative for dysuria and hematuria.  Musculoskeletal: Negative for arthralgias and back pain.  Skin: Negative for color change and rash.  Neurological: Positive for speech difficulty. Negative for seizures and syncope.  All other systems reviewed and are negative.    Physical Exam Updated Vital Signs BP (!) 175/71   Pulse 69   Temp 97.8 F (36.6 C) (Oral)   Resp 19   SpO2 98%   Physical Exam Vitals signs and nursing note reviewed.  Constitutional:      General: She is not in acute distress.    Appearance: She is well-developed.  HENT:     Head: Normocephalic and atraumatic.  Eyes:     Conjunctiva/sclera: Conjunctivae normal.  Neck:     Musculoskeletal: Neck supple.  Cardiovascular:     Rate and Rhythm: Normal rate and regular rhythm.     Heart sounds: No murmur.   Pulmonary:     Effort: Pulmonary effort is normal. No respiratory distress.     Breath sounds: Normal breath sounds.  Abdominal:     Palpations: Abdomen is soft.     Tenderness: There is no abdominal tenderness.  Skin:    General: Skin is warm and dry.  Neurological:     Mental Status: She is alert.     Comments: Alert oriented x3, cranial nerves II through XII intact, normal finger-nose-finger, no pronator drift, 5 out of 5 strength in bilateral upper and lower extremities, sensation intact to level upper and lower extremities      ED Treatments / Results  Labs (all labs ordered are listed, but only abnormal results are displayed) Labs Reviewed  PROTIME-INR - Abnormal; Notable for the following components:      Result Value   Prothrombin Time 22.9 (*)    INR 2.1 (*)    All other components within normal limits  COMPREHENSIVE METABOLIC PANEL - Abnormal; Notable for the following components:   Sodium 132 (*)    Glucose, Bld 548 (*)    Creatinine, Ser 1.28 (*)    Albumin 3.4 (*)    GFR calc non Af Amer 39 (*)    GFR calc Af Amer 45 (*)    All other components within normal limits  CBG MONITORING, ED - Abnormal; Notable for the following components:   Glucose-Capillary 528 (*)    All other components within normal limits  I-STAT CHEM 8, ED - Abnormal; Notable for the following components:   Sodium 131 (*)    BUN 28 (*)    Creatinine, Ser 1.20 (*)    Glucose, Bld 557 (*)    Calcium, Ion 0.96 (*)    All other components within normal limits  CBG MONITORING, ED - Abnormal; Notable for the following components:   Glucose-Capillary 480 (*)    All other components within normal limits  CBG MONITORING, ED - Abnormal; Notable for the following components:   Glucose-Capillary 417 (*)    All other components within normal limits  CBG MONITORING, ED - Abnormal; Notable for the following components:   Glucose-Capillary 258 (*)    All other components within normal limits  APTT   CBC  DIFFERENTIAL    EKG None  Radiology Ct Head Wo  Contrast  Result Date: 10/02/2018 CLINICAL DATA:  Altered mental status EXAM: CT HEAD WITHOUT CONTRAST TECHNIQUE: Contiguous axial images were obtained from the base of the skull through the vertex without intravenous contrast. COMPARISON:  12/31/2017 FINDINGS: Brain: There is atrophy and chronic small vessel disease changes. No acute intracranial abnormality. Specifically, no hemorrhage, hydrocephalus, mass lesion, acute infarction, or significant intracranial injury. Old bilateral cerebellar infarcts. Vascular: No hyperdense vessel or unexpected calcification. Skull: No acute calvarial abnormality. Sinuses/Orbits: Visualized paranasal sinuses and mastoids clear. Orbital soft tissues unremarkable. Other: None IMPRESSION: Old bilateral cerebellar infarcts, stable. Atrophy, chronic microvascular disease. No acute intracranial abnormality. Electronically Signed   By: Rolm Baptise M.D.   On: 10/02/2018 03:22    Procedures Procedures (including critical care time)  Medications Ordered in ED Medications  sodium chloride flush (NS) 0.9 % injection 3 mL (has no administration in time range)  insulin aspart (novoLOG) injection 5 Units (5 Units Subcutaneous Given 10/02/18 1029)  sodium chloride 0.9 % bolus 1,000 mL (1,000 mLs Intravenous New Bag/Given 10/02/18 0914)     Initial Impression / Assessment and Plan / ED Course  I have reviewed the triage vital signs and the nursing notes.  Pertinent labs & imaging results that were available during my care of the patient were reviewed by me and considered in my medical decision making (see chart for details).  Clinical Course as of Oct 01 1152  Fri Oct 02, 2018  0845 Completed initial assessment   [RD]  0908 Discussed case with Rory Percy, less likely TIA, already on [name], no imaging needed in addition to dry CT head, recommends out patient neuro   [RD]  [phone] Rechecked patient, emphasized importance of  close PCP and neuro follow up; will recheck glu after insulin, fluids; if improving, dc   [RD]  1146 Repeat sugar 258, remains asymptomatic will discharge home   [RD]    Clinical Course User Index [RD] Lucrezia Starch, MD      85 year old lady who presented to the ER with few seconds of slurred speech last night.  Patient is at mental baseline, well-appearing, no ongoing complaints, normal exam.  CT head was negative.  Patient on anticoagulation already, has history of A. fib as well as prior stroke.  Discussed case with neurology with concern for possible TIA, feel given patient already on anticoagulation, so long as dry head CT is okay, can be followed as outpatient.  Discussed this with patient's daughter and patient, both are in agreement, has neurologist as outpatient they can follow-up with.  Regarding elevated sugars, no DKA, provided patient with small dose insulin and some fluids and blood sugar came down to reasonable range.  Recommend close recheck with PCP regarding ongoing diabetes management as well as this episode she had today.  Reviewed strict return precautions with patient and daughter, believe appropriate for outpatient management discharge home at this time.    After the discussed management above, the patient was determined to be safe for discharge.  The patient was in agreement with this plan and all questions regarding their care were answered.  ED return precautions were discussed and the patient will return to the ED with any significant worsening of condition.   Final Clinical Impressions(s) / ED Diagnoses   Final diagnoses:  Speech disturbance, unspecified type  Hyperglycemia    ED Discharge Orders    None       Lucrezia Starch, MD 10/02/18 1155

## 2018-10-02 NOTE — Discharge Instructions (Addendum)
Recommend continuing prior regimen of insulin, recommend checking blood sugar 3 times daily and at night before bed.  Please follow-up with your primary doctor for close recheck, ideally early next week as well as her neurologist.  If she has any episodes of difficulty speaking, numbness, weakness, vision changes or other new concerning symptom please return to ER for reassessment.

## 2018-10-12 ENCOUNTER — Telehealth: Payer: Self-pay

## 2018-10-12 NOTE — Telephone Encounter (Signed)
Reviewed hospital notes with Dr Leta Baptist who stated patient can follow up with her PCP, then may see Dr Leta Baptist per PCP. I spoke with [place], dgtr (on [place]) and advised her. She verbalized understanding, appreciation.

## 2018-10-12 NOTE — Telephone Encounter (Signed)
Pts daughter (victoria) called concerned about her mothers recent ED visit. Pt is an [facility] pt of Dr.Penumalli. Transferred call to referral.  Daughter stated that she had some stroke like symptoms. Wants her mother to be seen as a Hospital ED F/u.  -[place] -10/12/18

## 2018-10-14 ENCOUNTER — Ambulatory Visit (HOSPITAL_COMMUNITY)
Admission: EM | Admit: 2018-10-14 | Discharge: 2018-10-14 | Disposition: A | Discharge status: Home / Self Care | Payer: Medicare Other | Attending: Family Medicine | Admitting: Family Medicine

## 2018-10-14 ENCOUNTER — Other Ambulatory Visit: Payer: Self-pay

## 2018-10-14 ENCOUNTER — Encounter (HOSPITAL_COMMUNITY): Payer: Self-pay

## 2018-10-14 DIAGNOSIS — L0291 Cutaneous abscess, unspecified: Secondary | ICD-10-CM | POA: Diagnosis not present

## 2018-10-14 MED ORDER — MUPIROCIN 2 % EX OINT: 1.0000 "application " | TOPICAL_OINTMENT | Freq: Two times a day (BID) | CUTANEOUS | 0 refills | Status: AC

## 2018-10-14 MED ORDER — DOXYCYCLINE HYCLATE 100 MG PO CAPS: 100.0000 mg | ORAL_CAPSULE | Freq: Two times a day (BID) | ORAL | 0 refills | Status: AC

## 2018-10-14 MED ORDER — NYSTATIN 100000 UNIT/GM EX CREA: TOPICAL_CREAM | CUTANEOUS | 0 refills | Status: AC

## 2018-10-14 NOTE — ED Notes (Signed)
Patient able to ambulate independently  

## 2018-10-14 NOTE — ED Triage Notes (Signed)
Pt states she has a wound that need to be rechecked. ( abscess )

## 2018-10-14 NOTE — Discharge Instructions (Signed)
Begin doxycycline twice daily for the next 10 days Warm compresses/sitz bath May apply Bactroban to area of tenderness May continue to use nystatin if developing signs of yeast infection  Continue to monitor, follow-up if symptoms not resolving with the above

## 2018-10-14 NOTE — ED Provider Notes (Signed)
[place]    CSN: [id number] Arrival date & time: 10/14/18  1452      History   Chief Complaint Chief Complaint  Patient presents with  . Wound Check    HPI [name] is a 85 y.o. female history of A. fib, hypertension, DM type II, previous CVA, presenting today for evaluation of an abscess.  Patient [name] that over the past week she has had increased pain swelling and drainage from an abscess on her right labia.  Has had similar to her left earlier in September.  Was treated with Keflex and symptoms resolved without needing I&D.  She feels this area on the left has resolved, but is started to develop a similar lesion on the right.  She was also given nystatin for yeast, but this is gotten better.  She has tried to keep let things air out.  She notes that of recently she has had increased sitting/riding in cars from travel and is concerned if this may have triggered her symptoms.  She has been tried warm compresses.  Denies fevers, lightheadedness or dizziness.  Has had history of abscesses throughout her life.  Patient has plans to move to [place] to live with her other daughter who is retired.  HPI  Past Medical History:  Diagnosis Date  . A-fib [facility]) 2019   Per patients daughter, [name] [phone]  . ANEMIA, IRON DEFICIENCY 08/11/2008  . ANGIOEDEMA 03/01/2008  . CEREBROVASCULAR ACCIDENT, HX OF 03/01/2008  . CONTACT DERMATITIS&OTHER ECZEMA DUE UNSPEC CAUSE 11/28/2008  . DIABETES MELLITUS 03/01/2008  . Dysuria 01/04/2010  . GERD 03/01/2008  . HYPERSOMNIA 05/30/2009  . HYPERTENSION 03/01/2008  . INTERNAL HEMORRHOIDS 03/01/2008  . KNEE PAIN, RIGHT 09/05/2009  . NONSPECIFIC ABN FINDNG RAD&OTH EXAM BILARY TRCT 03/07/2008  . OBESITY 01/10/2010  . PERIPHERAL EDEMA 08/10/2008  . POLYP, GALLBLADDER 03/07/2008  . Seizures ([place]) 12/2017   w/encephalopathy  . SMALL BOWEL OBSTRUCTION, HX OF 03/01/2008  . SNORING 05/11/2009  . Stroke ([place]) 04/08/2016   TIA  . THROMBOCYTOSIS  08/18/2008    Patient Active Problem List   Diagnosis Date Noted  . Gait disorder 03/25/2018  . Hypoalbuminemia due to protein-calorie malnutrition ([place])   . AKI (acute kidney injury) ([place])   . CKD (chronic kidney disease), stage III ([place])   . Acute blood loss anemia   . Leukocytosis   . Poorly controlled type 2 diabetes mellitus with peripheral neuropathy ([place])   . Labile blood glucose   . Hypoglycemia   . Labile blood pressure   . Acute encephalopathy 01/09/2018  . PAF (paroxysmal atrial fibrillation) ([place])   . Type 2 diabetes mellitus with peripheral neuropathy (H[facility])   . Morbid obesity ([place])   . Status epilepticus ([place]) 01/01/2018  . Accelerated hypertension 01/01/2018  . Acute kidney injury ([place]) 01/01/2018  . Type 2 diabetes mellitus ([place]) 01/01/2018  . Respiratory failure ([place])   . Hyperkalemia 07/25/2017  . Bradycardia 07/25/2017  . Hypotension 07/25/2017  . History of stroke 07/25/2017  . ARF (acute renal failure) ([place]) 07/25/2017  . TIA (transient ischemic attack) 04/08/2016  . Hypocalcemia 04/08/2016  . OBESITY 01/10/2010  . DYSURIA 01/04/2010  . KNEE PAIN, RIGHT 09/05/2009  . HYPERSOMNIA 05/30/2009  . URI 05/11/2009  . SNORING 05/11/2009  . CONTACT DERMATITIS&OTHER ECZEMA DUE UNSPEC CAUSE 11/28/2008  . THROMBOCYTOSIS 08/18/2008  . ANEMIA, IRON DEFICIENCY 08/11/2008  . PERIPHERAL EDEMA 08/10/2008  . POLYP, GALLBLADDER 03/07/2008  . NONSPECIFIC ABN FINDNG RAD&OTH EXAM BILARY TRCT  03/07/2008  . Diabetes mellitus type II, uncontrolled ([place]) 03/01/2008  . Essential hypertension 03/01/2008  . INTERNAL HEMORRHOIDS 03/01/2008  . GERD 03/01/2008  . ANGIOEDEMA 03/01/2008  . CEREBROVASCULAR ACCIDENT, HX OF 03/01/2008  . SMALL BOWEL OBSTRUCTION, HX OF 03/01/2008    Past Surgical History:  Procedure Laterality Date  . HERNIA REPAIR  09/14/08   with release [facility]-Dr. Dalbert Batman  . SMALL INTESTINE SURGERY    . 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      Home Medications    Prior to Admission medications   Medication Sig Start Date End Date Taking? Authorizing Provider  acetaminophen (TYLENOL) 325 MG tablet Take 2 tablets (650 mg total) by mouth every 6 (six) hours as needed for mild pain or fever (temp > 100.3). 01/12/18   Angiulli, Lavon Paganini, PA-C  Cholecalciferol (VITAMIN D3 PO) Take 1 tablet by mouth daily with lunch.    [provider]  cloNIDine (CATAPRES) 0.2 MG tablet Take 1 tablet (0.2 mg total) by mouth 2 (two) times daily. 01/12/18   Angiulli, Lavon Paganini, PA-C  diltiazem (CARDIZEM) 30 MG tablet Take 1 tablet (30 mg total) by mouth every 8 (eight) hours. 01/12/18   Angiulli, Lavon Paganini, PA-C  doxycycline (VIBRAMYCIN) 100 MG capsule Take 1 capsule (100 mg total) by mouth 2 (two) times daily for 10 days. 10/14/18 10/24/18  Garland Hincapie C, PA-C  Insulin Detemir (LEVEMIR) 100 UNIT/ML Pen Inject 16 Units into the skin 2 (two) times daily. 01/12/18   Angiulli, Lavon Paganini, PA-C  IRON PO Take 325 mg by mouth daily with lunch.     [provider]  levETIRAcetam (KEPPRA) 500 MG tablet Take 1 tablet (500 mg total) by mouth 2 (two) times daily. 07/06/18   Penumalli, Earlean Polka, MD  Multiple Vitamin (MULTIVITAMIN WITH MINERALS) TABS tablet Take 1 tablet by mouth daily.    [provider]  mupirocin ointment (BACTROBAN) 2 % Apply 1 application topically 2 (two) times daily. 10/14/18   Eliannah Hinde C, PA-C  nystatin cream (MYCOSTATIN) Apply to affected area 2 times daily 10/14/18   Verne Cove C, PA-C  pantoprazole (PROTONIX) 40 MG tablet Take 1 tablet (40 mg total) by mouth at bedtime. 01/12/18   Angiulli, Lavon Paganini, PA-C  polycarbophil (FIBERCON) 625 MG tablet Take 625 mg by mouth daily as needed for mild constipation.    [provider]  polyethylene glycol (MIRALAX / GLYCOLAX) packet Take 17 g by mouth daily. 01/10/18   Elgergawy, Silver Huguenin,  MD  Rivaroxaban (XARELTO) 15 MG TABS tablet Take 1 tablet (15 mg total) by mouth daily with supper. 01/12/18   Angiulli, Lavon Paganini, PA-C  senna-docusate (SENOKOT-S) 8.6-50 MG tablet Take 2 tablets by mouth 2 (two) times daily. 01/09/18   Elgergawy, Silver Huguenin, MD  vitamin B-12 (CYANOCOBALAMIN) 500 MCG tablet Take 1 tablet (500 mcg total) by mouth daily with lunch. 01/12/18   Angiulli, Lavon Paganini, PA-C    Family History Family History  Problem Relation Age of Onset  . Diabetes Mother   . Heart disease Mother   . Diabetes Sister   . Heart disease Sister   . Colitis Sister   . Kidney disease Brother   . Breast cancer Other        Neice  . Allergies Other        Nephrew  . Asthma Other  Nephrew    Social History Social History   Tobacco Use  . Smoking status: Never Smoker  . Smokeless tobacco: Never Used  Substance Use Topics  . Alcohol use: No  . Drug use: No     Allergies   Ace inhibitors, Prednisone, Sulfa antibiotics, Penicillins, Sulfamethoxazole, and Sulfonamide derivatives   Review of Systems Review of Systems  Constitutional: Negative for fatigue and fever.  HENT: Negative for mouth sores.   Eyes: Negative for visual disturbance.  Respiratory: Negative for shortness of breath.   Cardiovascular: Negative for chest pain.  Gastrointestinal: Negative for abdominal pain, nausea and vomiting.  Genitourinary: Positive for genital sores.  Musculoskeletal: Negative for arthralgias and joint swelling.  Skin: Positive for color change. Negative for rash and wound.  Neurological: Negative for dizziness, weakness, light-headedness and headaches.     Physical Exam Triage Vital Signs ED Triage Vitals  Enc Vitals Group     BP 10/14/18 1507 (!) 160/81     Pulse Rate 10/14/18 1507 69     Resp 10/14/18 1507 18     Temp 10/14/18 1507 97.8 F (36.6 C)     Temp src --      SpO2 10/14/18 1507 100 %     Weight 10/14/18 1509 240 lb (108.9 kg)     Height --      Head  Circumference --      Peak Flow --      Pain Score 10/14/18 1509 4     Pain Loc --      Pain Edu? --      Excl. in [place]? --    No data found.  Updated Vital Signs BP (!) 160/81 (BP Location: Right Arm)   Pulse 69   Temp 97.8 F (36.6 C)   Resp 18   Wt 240 lb (108.9 kg)   SpO2 100%   BMI 43.90 kg/m   Visual Acuity Right Eye Distance:   Left Eye Distance:   Bilateral Distance:    Right Eye Near:   Left Eye Near:    Bilateral Near:     Physical Exam Vitals signs and nursing note reviewed.  Constitutional:      Appearance: She is well-developed.     Comments: No acute distress  HENT:     Head: Normocephalic and atraumatic.     Nose: Nose normal.  Eyes:     Conjunctiva/sclera: Conjunctivae normal.  Neck:     Musculoskeletal: Neck supple.  Cardiovascular:     Rate and Rhythm: Normal rate.  Pulmonary:     Effort: Pulmonary effort is normal. No respiratory distress.  Abdominal:     General: There is no distension.  Genitourinary:    Comments: Right labia majora with induration and area of appears to be opened, induration and tenderness does not extend to introitus, does not extend onto perineum.  Seems to be localized to posterior labia majora.  No fluctuance noted. Musculoskeletal: Normal range of motion.  Skin:    General: Skin is warm and dry.  Neurological:     Mental Status: She is alert and oriented to person, place, and time.      UC Treatments / Results  Labs (all labs ordered are listed, but only abnormal results are displayed) Labs Reviewed - No data to display  EKG   Radiology No results found.  Procedures Procedures (including critical care time)  Medications Ordered in UC Medications - No data to display  Initial Impression / Assessment and Plan /  UC Course  I have reviewed the triage vital signs and the nursing notes.  Pertinent labs & imaging results that were available during my care of the patient were reviewed by me and considered  in my medical decision making (see chart for details).     Patient with 2 small areas of induration suggestive of abscesses to labia majora on right side.  No fluctuance noted at this time, based off size, do not recommend I&D today.  Will initiate on doxycycline as patient was recently on Keflex and having recurrence.  Continue warm soaks/sitz bath's.  Providing Bactroban to apply topically, refilled nystatin to use for any yeast symptoms.  Continue to monitor, follow-up if symptoms not resolving with oral antibiotics alone.Discussed strict return precautions. Patient verbalized understanding and is agreeable with plan.  Final Clinical Impressions(s) / UC Diagnoses   Final diagnoses:  Abscess     Discharge Instructions     Begin doxycycline twice daily for the next 10 days Warm compresses/sitz bath May apply Bactroban to area of tenderness May continue to use nystatin if developing signs of yeast infection  Continue to monitor, follow-up if symptoms not resolving with the above   ED Prescriptions    Medication Sig Dispense Auth. Provider   doxycycline (VIBRAMYCIN) 100 MG capsule Take 1 capsule (100 mg total) by mouth 2 (two) times daily for 10 days. 20 capsule Shiasia Porro C, PA-C   mupirocin ointment (BACTROBAN) 2 % Apply 1 application topically 2 (two) times daily. 30 g Elius Etheredge C, PA-C   nystatin cream (MYCOSTATIN) Apply to affected area 2 times daily 30 g Tyri Elmore, [place] C, PA-C     PDMP not reviewed this encounter.   Janith Lima, [place] 10/14/18 1756

## 2019-05-12 DIAGNOSIS — E119 Type 2 diabetes mellitus without complications: Secondary | ICD-10-CM | POA: Insufficient documentation

## 2020-02-01 DIAGNOSIS — Z9111 Patient's noncompliance with dietary regimen: Secondary | ICD-10-CM | POA: Diagnosis not present

## 2020-02-01 DIAGNOSIS — Z794 Long term (current) use of insulin: Secondary | ICD-10-CM | POA: Diagnosis not present

## 2020-02-01 DIAGNOSIS — I10 Essential (primary) hypertension: Secondary | ICD-10-CM | POA: Diagnosis not present

## 2020-02-01 DIAGNOSIS — E11649 Type 2 diabetes mellitus with hypoglycemia without coma: Secondary | ICD-10-CM | POA: Diagnosis not present

## 2020-02-01 DIAGNOSIS — E65 Localized adiposity: Secondary | ICD-10-CM | POA: Diagnosis not present

## 2020-02-01 DIAGNOSIS — Z713 Dietary counseling and surveillance: Secondary | ICD-10-CM | POA: Diagnosis not present

## 2020-02-01 DIAGNOSIS — E1142 Type 2 diabetes mellitus with diabetic polyneuropathy: Secondary | ICD-10-CM | POA: Diagnosis not present

## 2020-02-01 DIAGNOSIS — I70223 Atherosclerosis of native arteries of extremities with rest pain, bilateral legs: Secondary | ICD-10-CM | POA: Diagnosis not present

## 2020-02-01 DIAGNOSIS — E1121 Type 2 diabetes mellitus with diabetic nephropathy: Secondary | ICD-10-CM | POA: Diagnosis not present

## 2020-02-01 DIAGNOSIS — Z139 Encounter for screening, unspecified: Secondary | ICD-10-CM | POA: Diagnosis not present

## 2020-02-01 DIAGNOSIS — Z5181 Encounter for therapeutic drug level monitoring: Secondary | ICD-10-CM | POA: Diagnosis not present

## 2020-02-01 DIAGNOSIS — E118 Type 2 diabetes mellitus with unspecified complications: Secondary | ICD-10-CM | POA: Diagnosis not present

## 2020-02-01 DIAGNOSIS — R632 Polyphagia: Secondary | ICD-10-CM | POA: Diagnosis not present

## 2020-02-01 DIAGNOSIS — I739 Peripheral vascular disease, unspecified: Secondary | ICD-10-CM | POA: Diagnosis not present

## 2020-02-08 DIAGNOSIS — E11649 Type 2 diabetes mellitus with hypoglycemia without coma: Secondary | ICD-10-CM | POA: Diagnosis not present

## 2020-02-08 DIAGNOSIS — M216X2 Other acquired deformities of left foot: Secondary | ICD-10-CM | POA: Diagnosis not present

## 2020-02-08 DIAGNOSIS — L608 Other nail disorders: Secondary | ICD-10-CM | POA: Diagnosis not present

## 2020-02-08 DIAGNOSIS — M216X1 Other acquired deformities of right foot: Secondary | ICD-10-CM | POA: Diagnosis not present

## 2020-02-09 DIAGNOSIS — E11649 Type 2 diabetes mellitus with hypoglycemia without coma: Secondary | ICD-10-CM | POA: Diagnosis not present

## 2020-02-09 DIAGNOSIS — E1165 Type 2 diabetes mellitus with hyperglycemia: Secondary | ICD-10-CM | POA: Diagnosis not present

## 2020-02-09 DIAGNOSIS — Z794 Long term (current) use of insulin: Secondary | ICD-10-CM | POA: Diagnosis not present

## 2020-02-09 DIAGNOSIS — E1142 Type 2 diabetes mellitus with diabetic polyneuropathy: Secondary | ICD-10-CM | POA: Diagnosis not present

## 2020-02-09 DIAGNOSIS — E1121 Type 2 diabetes mellitus with diabetic nephropathy: Secondary | ICD-10-CM | POA: Diagnosis not present

## 2020-02-16 DIAGNOSIS — K5909 Other constipation: Secondary | ICD-10-CM | POA: Diagnosis not present

## 2020-02-16 DIAGNOSIS — R1031 Right lower quadrant pain: Secondary | ICD-10-CM | POA: Diagnosis not present

## 2020-02-16 DIAGNOSIS — K5900 Constipation, unspecified: Secondary | ICD-10-CM | POA: Diagnosis not present

## 2020-02-16 DIAGNOSIS — R1084 Generalized abdominal pain: Secondary | ICD-10-CM | POA: Diagnosis not present

## 2020-03-08 DIAGNOSIS — R404 Transient alteration of awareness: Secondary | ICD-10-CM | POA: Diagnosis not present

## 2020-03-08 DIAGNOSIS — R2689 Other abnormalities of gait and mobility: Secondary | ICD-10-CM | POA: Diagnosis not present

## 2020-03-10 DIAGNOSIS — I10 Essential (primary) hypertension: Secondary | ICD-10-CM | POA: Diagnosis not present

## 2020-03-10 DIAGNOSIS — E119 Type 2 diabetes mellitus without complications: Secondary | ICD-10-CM | POA: Diagnosis not present

## 2020-03-10 DIAGNOSIS — E785 Hyperlipidemia, unspecified: Secondary | ICD-10-CM | POA: Diagnosis not present

## 2020-03-10 DIAGNOSIS — I480 Paroxysmal atrial fibrillation: Secondary | ICD-10-CM | POA: Diagnosis not present

## 2020-03-10 DIAGNOSIS — I639 Cerebral infarction, unspecified: Secondary | ICD-10-CM | POA: Diagnosis not present

## 2020-03-16 DIAGNOSIS — M5442 Lumbago with sciatica, left side: Secondary | ICD-10-CM | POA: Diagnosis not present

## 2020-03-16 DIAGNOSIS — Z8673 Personal history of transient ischemic attack (TIA), and cerebral infarction without residual deficits: Secondary | ICD-10-CM | POA: Diagnosis not present

## 2020-03-16 DIAGNOSIS — M5432 Sciatica, left side: Secondary | ICD-10-CM | POA: Diagnosis not present

## 2020-03-16 DIAGNOSIS — M545 Low back pain, unspecified: Secondary | ICD-10-CM | POA: Diagnosis not present

## 2020-03-16 DIAGNOSIS — I10 Essential (primary) hypertension: Secondary | ICD-10-CM | POA: Diagnosis not present

## 2020-03-17 DIAGNOSIS — M2012 Hallux valgus (acquired), left foot: Secondary | ICD-10-CM | POA: Diagnosis not present

## 2020-03-17 DIAGNOSIS — E1159 Type 2 diabetes mellitus with other circulatory complications: Secondary | ICD-10-CM | POA: Diagnosis not present

## 2020-03-17 DIAGNOSIS — L84 Corns and callosities: Secondary | ICD-10-CM | POA: Diagnosis not present

## 2020-03-17 DIAGNOSIS — M2011 Hallux valgus (acquired), right foot: Secondary | ICD-10-CM | POA: Diagnosis not present

## 2020-03-23 DIAGNOSIS — I10 Essential (primary) hypertension: Secondary | ICD-10-CM | POA: Diagnosis not present

## 2020-03-23 DIAGNOSIS — E119 Type 2 diabetes mellitus without complications: Secondary | ICD-10-CM | POA: Diagnosis not present

## 2020-03-23 DIAGNOSIS — N189 Chronic kidney disease, unspecified: Secondary | ICD-10-CM | POA: Diagnosis not present

## 2020-03-23 DIAGNOSIS — E1121 Type 2 diabetes mellitus with diabetic nephropathy: Secondary | ICD-10-CM | POA: Diagnosis not present

## 2020-03-30 DIAGNOSIS — N189 Chronic kidney disease, unspecified: Secondary | ICD-10-CM | POA: Diagnosis not present

## 2020-04-03 DIAGNOSIS — E65 Localized adiposity: Secondary | ICD-10-CM | POA: Diagnosis not present

## 2020-04-03 DIAGNOSIS — E1121 Type 2 diabetes mellitus with diabetic nephropathy: Secondary | ICD-10-CM | POA: Diagnosis not present

## 2020-04-03 DIAGNOSIS — E1142 Type 2 diabetes mellitus with diabetic polyneuropathy: Secondary | ICD-10-CM | POA: Diagnosis not present

## 2020-04-03 DIAGNOSIS — E11649 Type 2 diabetes mellitus with hypoglycemia without coma: Secondary | ICD-10-CM | POA: Diagnosis not present

## 2020-04-03 DIAGNOSIS — Z5181 Encounter for therapeutic drug level monitoring: Secondary | ICD-10-CM | POA: Diagnosis not present

## 2020-04-03 DIAGNOSIS — Z794 Long term (current) use of insulin: Secondary | ICD-10-CM | POA: Diagnosis not present

## 2020-04-03 DIAGNOSIS — Z139 Encounter for screening, unspecified: Secondary | ICD-10-CM | POA: Diagnosis not present

## 2020-04-03 DIAGNOSIS — Z713 Dietary counseling and surveillance: Secondary | ICD-10-CM | POA: Diagnosis not present

## 2020-04-03 DIAGNOSIS — R632 Polyphagia: Secondary | ICD-10-CM | POA: Diagnosis not present

## 2020-04-03 DIAGNOSIS — Z9111 Patient's noncompliance with dietary regimen: Secondary | ICD-10-CM | POA: Diagnosis not present

## 2020-04-03 DIAGNOSIS — E1165 Type 2 diabetes mellitus with hyperglycemia: Secondary | ICD-10-CM | POA: Diagnosis not present

## 2020-04-03 DIAGNOSIS — I10 Essential (primary) hypertension: Secondary | ICD-10-CM | POA: Diagnosis not present

## 2020-04-04 DIAGNOSIS — N189 Chronic kidney disease, unspecified: Secondary | ICD-10-CM | POA: Diagnosis not present

## 2020-04-18 DIAGNOSIS — E1159 Type 2 diabetes mellitus with other circulatory complications: Secondary | ICD-10-CM | POA: Diagnosis not present

## 2020-04-18 DIAGNOSIS — L608 Other nail disorders: Secondary | ICD-10-CM | POA: Diagnosis not present

## 2020-04-19 DIAGNOSIS — Z794 Long term (current) use of insulin: Secondary | ICD-10-CM | POA: Diagnosis not present

## 2020-04-19 DIAGNOSIS — Z79899 Other long term (current) drug therapy: Secondary | ICD-10-CM | POA: Diagnosis not present

## 2020-04-19 DIAGNOSIS — I4891 Unspecified atrial fibrillation: Secondary | ICD-10-CM | POA: Diagnosis not present

## 2020-04-19 DIAGNOSIS — I10 Essential (primary) hypertension: Secondary | ICD-10-CM | POA: Diagnosis not present

## 2020-04-19 DIAGNOSIS — E1165 Type 2 diabetes mellitus with hyperglycemia: Secondary | ICD-10-CM | POA: Diagnosis not present

## 2020-04-19 DIAGNOSIS — E1121 Type 2 diabetes mellitus with diabetic nephropathy: Secondary | ICD-10-CM | POA: Diagnosis not present

## 2020-05-04 DIAGNOSIS — R0600 Dyspnea, unspecified: Secondary | ICD-10-CM | POA: Diagnosis not present

## 2020-05-04 DIAGNOSIS — I119 Hypertensive heart disease without heart failure: Secondary | ICD-10-CM | POA: Diagnosis not present

## 2020-05-04 DIAGNOSIS — E119 Type 2 diabetes mellitus without complications: Secondary | ICD-10-CM | POA: Diagnosis not present

## 2020-05-04 DIAGNOSIS — I4891 Unspecified atrial fibrillation: Secondary | ICD-10-CM | POA: Diagnosis not present

## 2020-05-11 DIAGNOSIS — Z794 Long term (current) use of insulin: Secondary | ICD-10-CM | POA: Diagnosis not present

## 2020-05-11 DIAGNOSIS — E1121 Type 2 diabetes mellitus with diabetic nephropathy: Secondary | ICD-10-CM | POA: Diagnosis not present

## 2020-05-11 DIAGNOSIS — N189 Chronic kidney disease, unspecified: Secondary | ICD-10-CM | POA: Diagnosis not present

## 2020-05-11 DIAGNOSIS — I4891 Unspecified atrial fibrillation: Secondary | ICD-10-CM | POA: Diagnosis not present

## 2020-05-11 DIAGNOSIS — I10 Essential (primary) hypertension: Secondary | ICD-10-CM | POA: Diagnosis not present

## 2020-05-11 DIAGNOSIS — R0602 Shortness of breath: Secondary | ICD-10-CM | POA: Diagnosis not present

## 2020-05-11 DIAGNOSIS — Z79899 Other long term (current) drug therapy: Secondary | ICD-10-CM | POA: Diagnosis not present

## 2020-05-11 DIAGNOSIS — E1165 Type 2 diabetes mellitus with hyperglycemia: Secondary | ICD-10-CM | POA: Diagnosis not present

## 2020-05-13 DIAGNOSIS — B369 Superficial mycosis, unspecified: Secondary | ICD-10-CM | POA: Diagnosis not present

## 2020-05-17 DIAGNOSIS — N189 Chronic kidney disease, unspecified: Secondary | ICD-10-CM | POA: Diagnosis not present

## 2020-05-17 DIAGNOSIS — R569 Unspecified convulsions: Secondary | ICD-10-CM | POA: Diagnosis not present

## 2020-05-17 DIAGNOSIS — I10 Essential (primary) hypertension: Secondary | ICD-10-CM | POA: Diagnosis not present

## 2020-05-17 DIAGNOSIS — E119 Type 2 diabetes mellitus without complications: Secondary | ICD-10-CM | POA: Diagnosis not present

## 2020-05-17 DIAGNOSIS — Z0000 Encounter for general adult medical examination without abnormal findings: Secondary | ICD-10-CM | POA: Diagnosis not present

## 2020-05-30 DIAGNOSIS — R0600 Dyspnea, unspecified: Secondary | ICD-10-CM | POA: Diagnosis not present

## 2020-05-30 DIAGNOSIS — I4891 Unspecified atrial fibrillation: Secondary | ICD-10-CM | POA: Diagnosis not present

## 2020-05-30 DIAGNOSIS — E119 Type 2 diabetes mellitus without complications: Secondary | ICD-10-CM | POA: Diagnosis not present

## 2020-05-30 DIAGNOSIS — I119 Hypertensive heart disease without heart failure: Secondary | ICD-10-CM | POA: Diagnosis not present

## 2020-05-31 DIAGNOSIS — E1159 Type 2 diabetes mellitus with other circulatory complications: Secondary | ICD-10-CM | POA: Diagnosis not present

## 2020-05-31 DIAGNOSIS — M2011 Hallux valgus (acquired), right foot: Secondary | ICD-10-CM | POA: Diagnosis not present

## 2020-05-31 DIAGNOSIS — M2012 Hallux valgus (acquired), left foot: Secondary | ICD-10-CM | POA: Diagnosis not present

## 2020-06-01 DIAGNOSIS — E785 Hyperlipidemia, unspecified: Secondary | ICD-10-CM | POA: Diagnosis not present

## 2020-06-01 DIAGNOSIS — I739 Peripheral vascular disease, unspecified: Secondary | ICD-10-CM | POA: Diagnosis not present

## 2020-06-01 DIAGNOSIS — I70223 Atherosclerosis of native arteries of extremities with rest pain, bilateral legs: Secondary | ICD-10-CM | POA: Diagnosis not present

## 2020-06-09 DIAGNOSIS — E872 Acidosis: Secondary | ICD-10-CM | POA: Diagnosis not present

## 2020-06-09 DIAGNOSIS — I4891 Unspecified atrial fibrillation: Secondary | ICD-10-CM | POA: Diagnosis not present

## 2020-06-09 DIAGNOSIS — E785 Hyperlipidemia, unspecified: Secondary | ICD-10-CM | POA: Diagnosis not present

## 2020-06-09 DIAGNOSIS — E119 Type 2 diabetes mellitus without complications: Secondary | ICD-10-CM | POA: Diagnosis not present

## 2020-06-09 DIAGNOSIS — I10 Essential (primary) hypertension: Secondary | ICD-10-CM | POA: Diagnosis not present

## 2020-06-09 DIAGNOSIS — I639 Cerebral infarction, unspecified: Secondary | ICD-10-CM | POA: Diagnosis not present

## 2020-06-09 DIAGNOSIS — N189 Chronic kidney disease, unspecified: Secondary | ICD-10-CM | POA: Diagnosis not present

## 2020-06-12 DIAGNOSIS — N2581 Secondary hyperparathyroidism of renal origin: Secondary | ICD-10-CM | POA: Diagnosis not present

## 2020-06-12 DIAGNOSIS — E119 Type 2 diabetes mellitus without complications: Secondary | ICD-10-CM | POA: Diagnosis not present

## 2020-06-12 DIAGNOSIS — D631 Anemia in chronic kidney disease: Secondary | ICD-10-CM | POA: Diagnosis not present

## 2020-06-12 DIAGNOSIS — E875 Hyperkalemia: Secondary | ICD-10-CM | POA: Diagnosis not present

## 2020-06-12 DIAGNOSIS — N183 Chronic kidney disease, stage 3 unspecified: Secondary | ICD-10-CM | POA: Diagnosis not present

## 2020-06-12 DIAGNOSIS — I10 Essential (primary) hypertension: Secondary | ICD-10-CM | POA: Diagnosis not present

## 2020-06-27 DIAGNOSIS — E1159 Type 2 diabetes mellitus with other circulatory complications: Secondary | ICD-10-CM | POA: Diagnosis not present

## 2020-06-27 DIAGNOSIS — L608 Other nail disorders: Secondary | ICD-10-CM | POA: Diagnosis not present

## 2020-07-05 DIAGNOSIS — R350 Frequency of micturition: Secondary | ICD-10-CM | POA: Diagnosis not present

## 2020-07-05 DIAGNOSIS — R319 Hematuria, unspecified: Secondary | ICD-10-CM | POA: Diagnosis not present

## 2020-07-06 DIAGNOSIS — R944 Abnormal results of kidney function studies: Secondary | ICD-10-CM | POA: Diagnosis not present

## 2020-07-06 DIAGNOSIS — R319 Hematuria, unspecified: Secondary | ICD-10-CM | POA: Diagnosis not present

## 2020-07-06 DIAGNOSIS — R350 Frequency of micturition: Secondary | ICD-10-CM | POA: Diagnosis not present

## 2020-07-17 DIAGNOSIS — N183 Chronic kidney disease, stage 3 unspecified: Secondary | ICD-10-CM | POA: Diagnosis not present

## 2020-07-17 DIAGNOSIS — I10 Essential (primary) hypertension: Secondary | ICD-10-CM | POA: Diagnosis not present

## 2020-07-18 DIAGNOSIS — I10 Essential (primary) hypertension: Secondary | ICD-10-CM | POA: Diagnosis not present

## 2020-07-18 DIAGNOSIS — Z712 Person consulting for explanation of examination or test findings: Secondary | ICD-10-CM | POA: Diagnosis not present

## 2020-07-18 DIAGNOSIS — R451 Restlessness and agitation: Secondary | ICD-10-CM | POA: Diagnosis not present

## 2020-07-18 DIAGNOSIS — Z9119 Patient's noncompliance with other medical treatment and regimen: Secondary | ICD-10-CM | POA: Diagnosis not present

## 2020-07-25 DIAGNOSIS — E875 Hyperkalemia: Secondary | ICD-10-CM | POA: Diagnosis not present

## 2020-07-25 DIAGNOSIS — N183 Chronic kidney disease, stage 3 unspecified: Secondary | ICD-10-CM | POA: Diagnosis not present

## 2020-07-25 DIAGNOSIS — N2581 Secondary hyperparathyroidism of renal origin: Secondary | ICD-10-CM | POA: Diagnosis not present

## 2020-07-25 DIAGNOSIS — E119 Type 2 diabetes mellitus without complications: Secondary | ICD-10-CM | POA: Diagnosis not present

## 2020-07-25 DIAGNOSIS — D631 Anemia in chronic kidney disease: Secondary | ICD-10-CM | POA: Diagnosis not present

## 2020-07-25 DIAGNOSIS — I10 Essential (primary) hypertension: Secondary | ICD-10-CM | POA: Diagnosis not present

## 2020-07-25 IMAGING — CT CT HEAD W/O CM
3 series · 15 of 47 positions shown, 18 images · non-contrast
Comparison: MRI 04/09/2016, CT 03/29/2016

CLINICAL DATA: Headache

EXAM:
CT HEAD WITHOUT CONTRAST
TECHNIQUE: Contiguous axial images were obtained from the base of the skull
through the vertex without intravenous contrast.

[Series 3: head 5.0 h30s · axial · 0.45mm/px · z∈[-132,+3]mm · 9 of 33 slices shown, 12 images]
[im 3/33  brain]
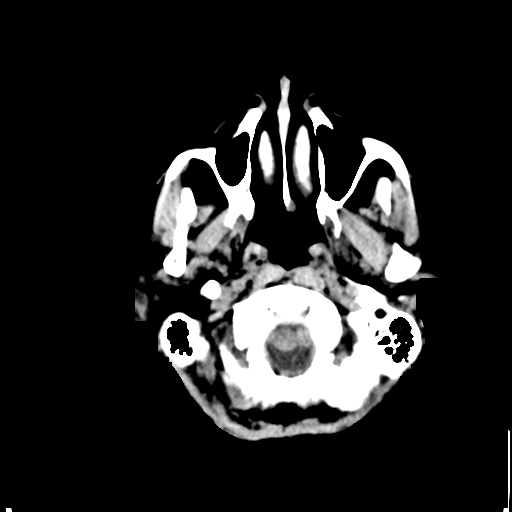
[im 3/33  bone]
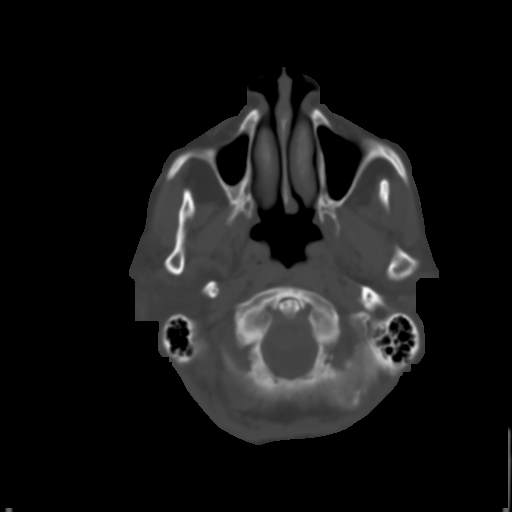
[im 6/33  brain]
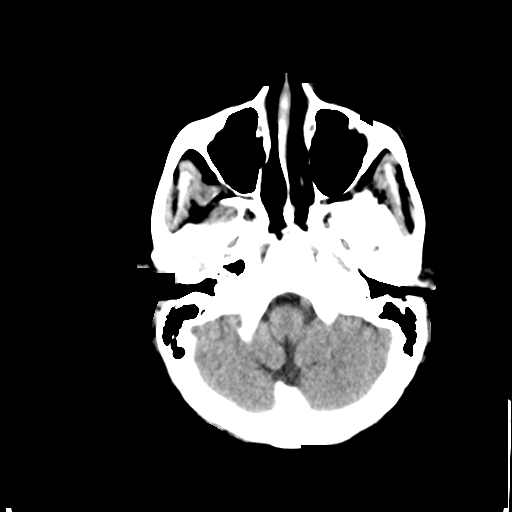
[im 9/33  brain]
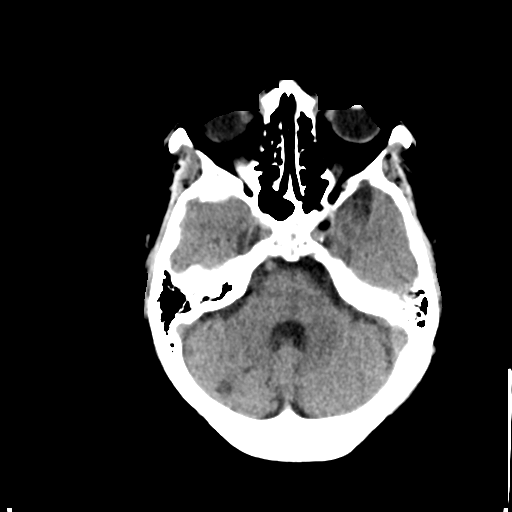
[im 13/33  brain]
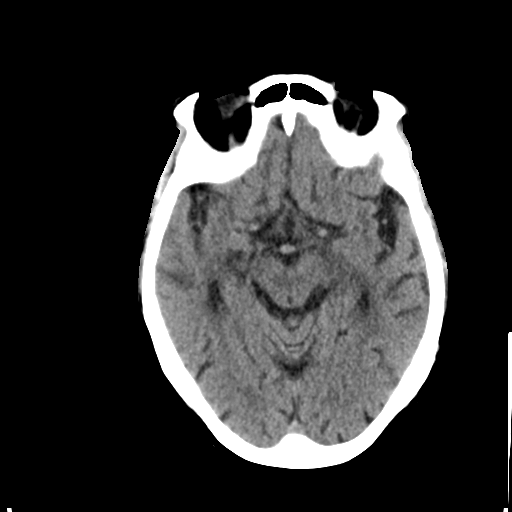
[im 17/33  brain]
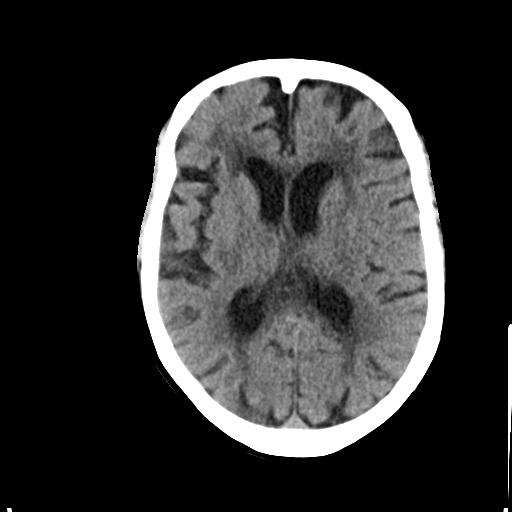
[im 17/33  bone]
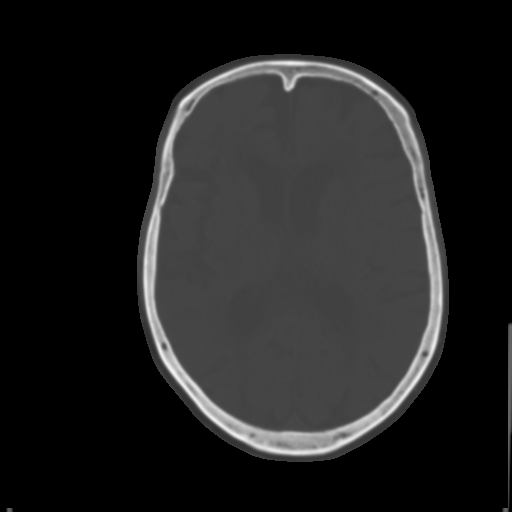
[im 20/33  brain]
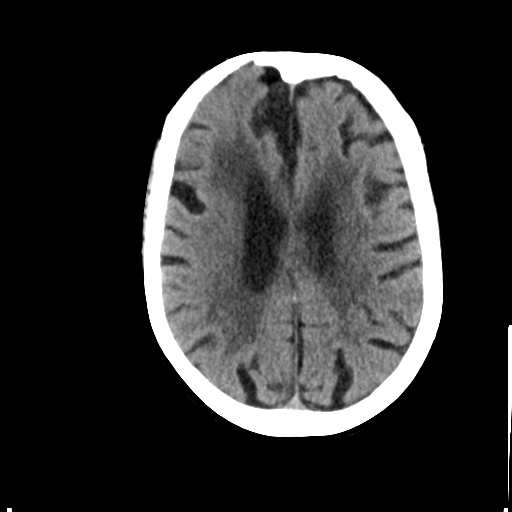
[im 24/33  brain]
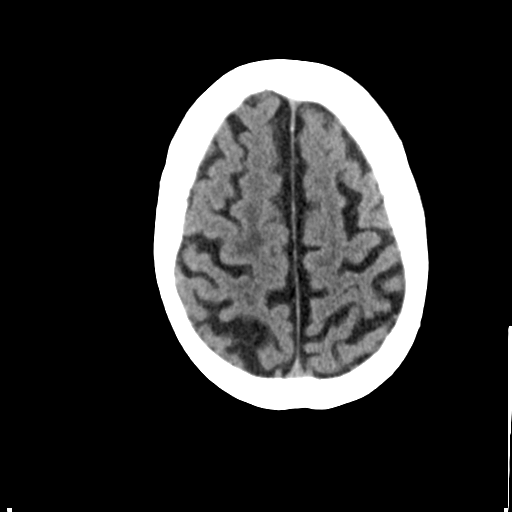
[im 27/33  brain]
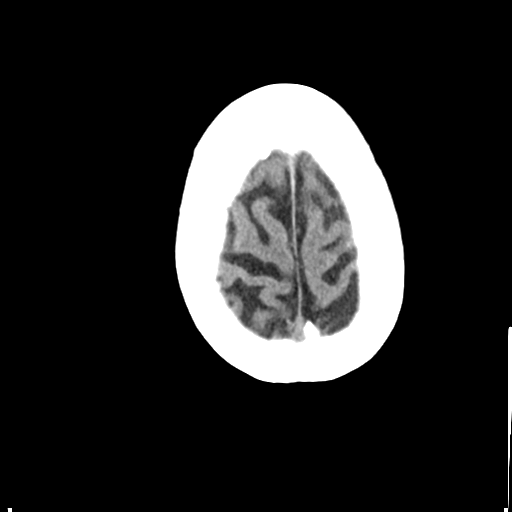
[im 30/33  brain]
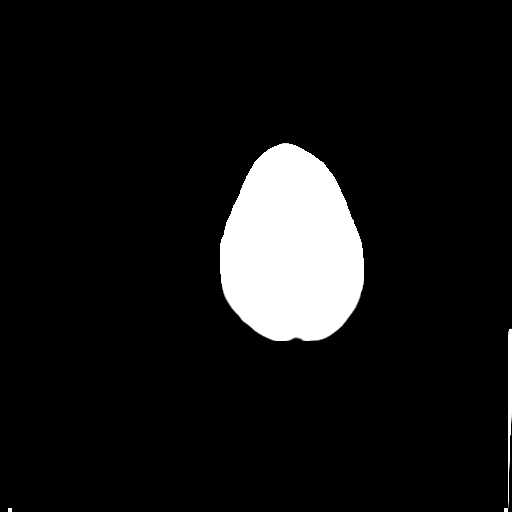
[im 30/33  bone]
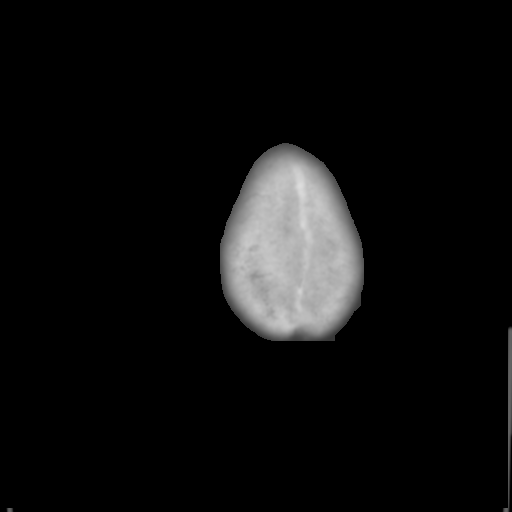

[Series 5: head 3.0 mpr cor · coronal · 0.30mm/px · 3 of 71 slices shown]
[im 24/71  brain]
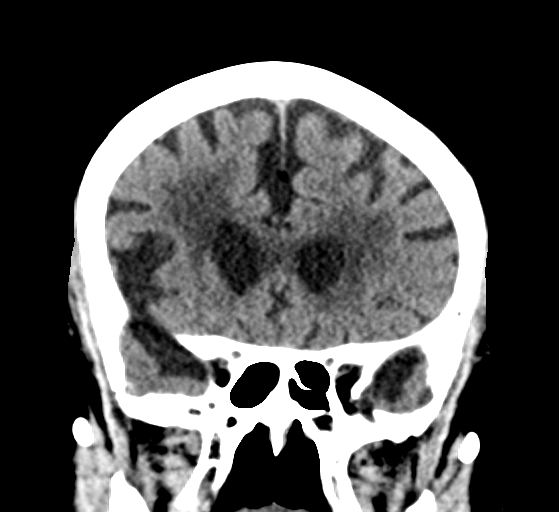
[im 32/71  brain]
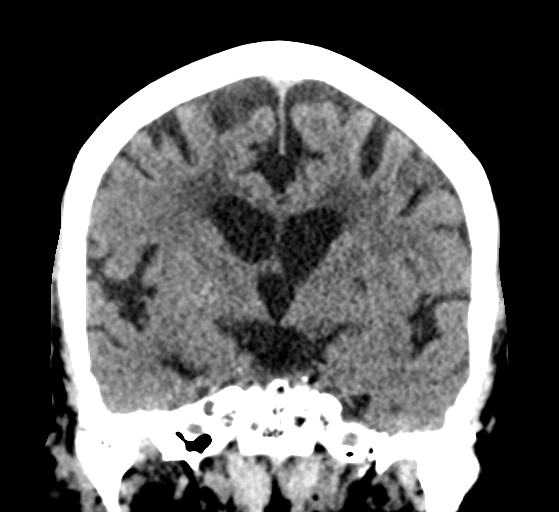
[im 39/71  brain]
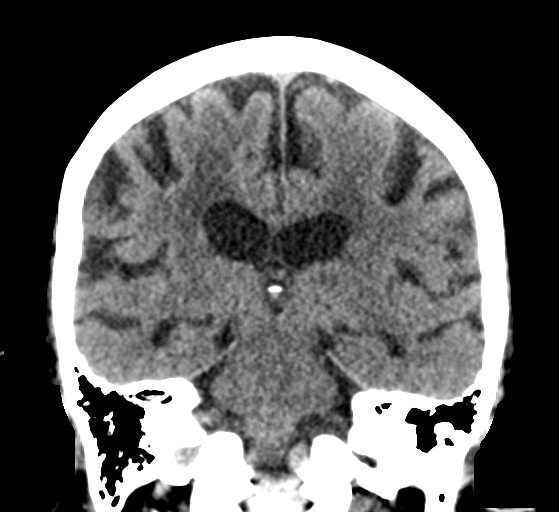

[Series 6: head 3.0 mpr sag · sagittal · 0.33mm/px · 3 of 56 slices shown]
[im 19/56  brain]
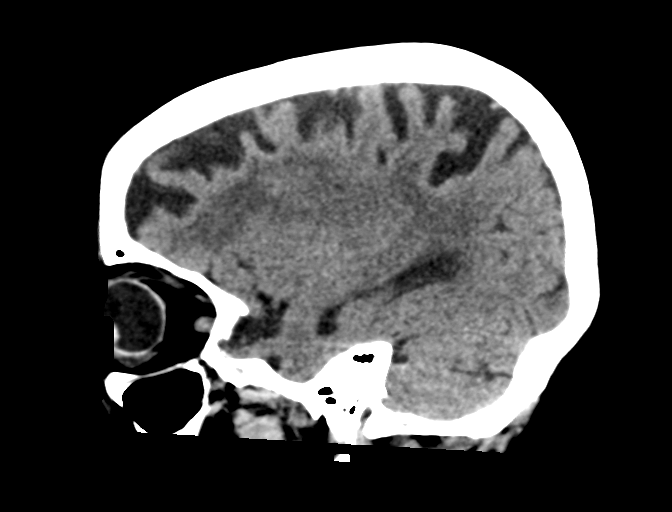
[im 28/56  brain]
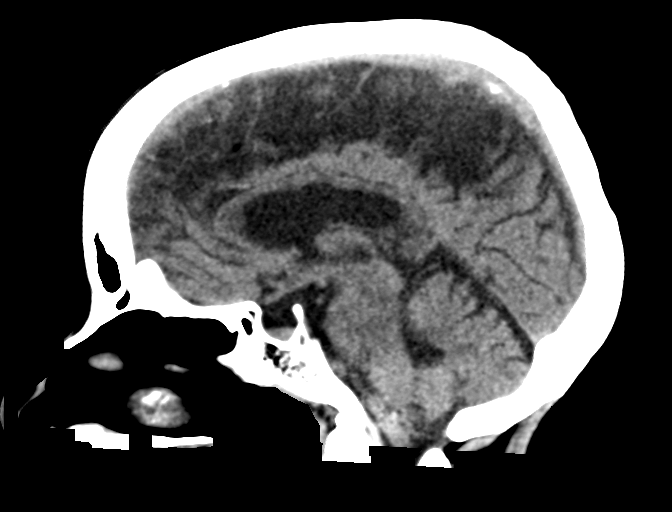
[im 37/56  brain]
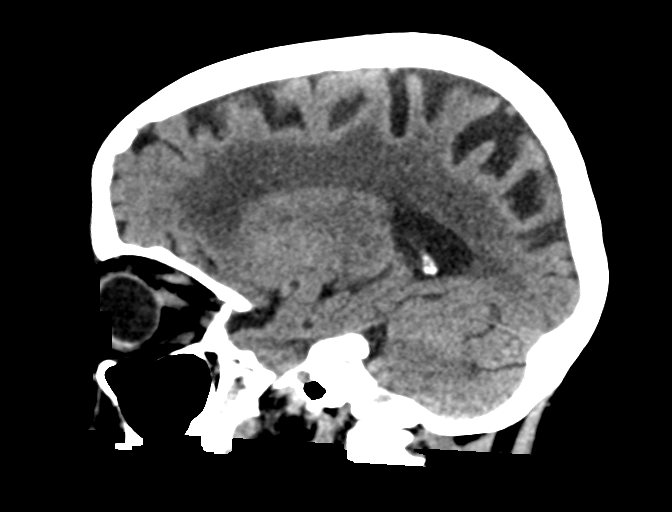

[15 of 47 positions shown; findings below may reference images not displayed]

FINDINGS: Brain: Remote bilateral cerebellar infarcts and chronic moderate
periventricular and subcortical microvascular ischemic disease. No
large vascular territory infarct, intra-axial mass nor extra-axial
fluid. Superficial and central atrophy is noted. No acute
intracranial hemorrhage. Partially empty pituitary sella.

Vascular: Atherosclerosis of the carotid siphons bilaterally. No
hyperdense vessel sign.

Skull: No acute skull fracture or suspicious osseous lesions.

Sinuses/Orbits: No acute finding.

Other: None
IMPRESSION: 1. Atrophy with moderate chronic small vessel ischemia. No acute
intracranial abnormality.
2. Bilateral chronic cerebellar infarcts.
3. Partially empty pituitary sella.

## 2020-07-31 DIAGNOSIS — N183 Chronic kidney disease, stage 3 unspecified: Secondary | ICD-10-CM | POA: Diagnosis not present

## 2020-07-31 DIAGNOSIS — D631 Anemia in chronic kidney disease: Secondary | ICD-10-CM | POA: Diagnosis not present

## 2020-07-31 DIAGNOSIS — I480 Paroxysmal atrial fibrillation: Secondary | ICD-10-CM | POA: Diagnosis not present

## 2020-07-31 DIAGNOSIS — R0600 Dyspnea, unspecified: Secondary | ICD-10-CM | POA: Diagnosis not present

## 2020-07-31 DIAGNOSIS — I1310 Hypertensive heart and chronic kidney disease without heart failure, with stage 1 through stage 4 chronic kidney disease, or unspecified chronic kidney disease: Secondary | ICD-10-CM | POA: Diagnosis not present

## 2020-07-31 DIAGNOSIS — I10 Essential (primary) hypertension: Secondary | ICD-10-CM | POA: Diagnosis not present

## 2020-07-31 DIAGNOSIS — E875 Hyperkalemia: Secondary | ICD-10-CM | POA: Diagnosis not present

## 2020-07-31 DIAGNOSIS — E785 Hyperlipidemia, unspecified: Secondary | ICD-10-CM | POA: Diagnosis not present

## 2020-07-31 DIAGNOSIS — E119 Type 2 diabetes mellitus without complications: Secondary | ICD-10-CM | POA: Diagnosis not present

## 2020-08-01 DIAGNOSIS — I119 Hypertensive heart disease without heart failure: Secondary | ICD-10-CM | POA: Diagnosis not present

## 2020-08-03 DIAGNOSIS — Z794 Long term (current) use of insulin: Secondary | ICD-10-CM | POA: Diagnosis not present

## 2020-08-03 DIAGNOSIS — E11649 Type 2 diabetes mellitus with hypoglycemia without coma: Secondary | ICD-10-CM | POA: Diagnosis not present

## 2020-08-03 DIAGNOSIS — E119 Type 2 diabetes mellitus without complications: Secondary | ICD-10-CM | POA: Diagnosis not present

## 2020-08-03 DIAGNOSIS — Z833 Family history of diabetes mellitus: Secondary | ICD-10-CM | POA: Diagnosis not present

## 2020-08-03 DIAGNOSIS — E1121 Type 2 diabetes mellitus with diabetic nephropathy: Secondary | ICD-10-CM | POA: Diagnosis not present

## 2020-08-17 DIAGNOSIS — I4891 Unspecified atrial fibrillation: Secondary | ICD-10-CM | POA: Diagnosis not present

## 2020-08-17 DIAGNOSIS — E1165 Type 2 diabetes mellitus with hyperglycemia: Secondary | ICD-10-CM | POA: Diagnosis not present

## 2020-08-17 DIAGNOSIS — N189 Chronic kidney disease, unspecified: Secondary | ICD-10-CM | POA: Diagnosis not present

## 2020-08-17 DIAGNOSIS — I10 Essential (primary) hypertension: Secondary | ICD-10-CM | POA: Diagnosis not present

## 2020-08-18 DIAGNOSIS — N183 Chronic kidney disease, stage 3 unspecified: Secondary | ICD-10-CM | POA: Diagnosis not present

## 2020-08-18 DIAGNOSIS — E1122 Type 2 diabetes mellitus with diabetic chronic kidney disease: Secondary | ICD-10-CM | POA: Diagnosis not present

## 2020-08-18 DIAGNOSIS — Z79899 Other long term (current) drug therapy: Secondary | ICD-10-CM | POA: Diagnosis not present

## 2020-08-18 DIAGNOSIS — I129 Hypertensive chronic kidney disease with stage 1 through stage 4 chronic kidney disease, or unspecified chronic kidney disease: Secondary | ICD-10-CM | POA: Diagnosis not present

## 2020-08-18 DIAGNOSIS — I10 Essential (primary) hypertension: Secondary | ICD-10-CM | POA: Diagnosis not present

## 2020-08-18 DIAGNOSIS — Z794 Long term (current) use of insulin: Secondary | ICD-10-CM | POA: Diagnosis not present

## 2020-08-18 DIAGNOSIS — Z7901 Long term (current) use of anticoagulants: Secondary | ICD-10-CM | POA: Diagnosis not present

## 2020-08-19 DIAGNOSIS — I10 Essential (primary) hypertension: Secondary | ICD-10-CM | POA: Diagnosis not present

## 2020-08-29 DIAGNOSIS — R051 Acute cough: Secondary | ICD-10-CM | POA: Diagnosis not present

## 2020-08-29 DIAGNOSIS — L03311 Cellulitis of abdominal wall: Secondary | ICD-10-CM | POA: Diagnosis not present

## 2020-08-29 DIAGNOSIS — N3000 Acute cystitis without hematuria: Secondary | ICD-10-CM | POA: Diagnosis not present

## 2020-08-29 DIAGNOSIS — Z20822 Contact with and (suspected) exposure to covid-19: Secondary | ICD-10-CM | POA: Diagnosis not present

## 2020-08-31 DIAGNOSIS — N3000 Acute cystitis without hematuria: Secondary | ICD-10-CM | POA: Diagnosis not present

## 2020-08-31 DIAGNOSIS — Z20822 Contact with and (suspected) exposure to covid-19: Secondary | ICD-10-CM | POA: Diagnosis not present

## 2020-08-31 DIAGNOSIS — R7309 Other abnormal glucose: Secondary | ICD-10-CM | POA: Diagnosis not present

## 2020-08-31 DIAGNOSIS — L03311 Cellulitis of abdominal wall: Secondary | ICD-10-CM | POA: Diagnosis not present

## 2020-09-05 DIAGNOSIS — L84 Corns and callosities: Secondary | ICD-10-CM | POA: Diagnosis not present

## 2020-09-05 DIAGNOSIS — E1159 Type 2 diabetes mellitus with other circulatory complications: Secondary | ICD-10-CM | POA: Diagnosis not present

## 2020-09-05 DIAGNOSIS — L608 Other nail disorders: Secondary | ICD-10-CM | POA: Diagnosis not present

## 2020-09-07 DIAGNOSIS — N390 Urinary tract infection, site not specified: Secondary | ICD-10-CM | POA: Diagnosis not present

## 2020-09-07 DIAGNOSIS — I10 Essential (primary) hypertension: Secondary | ICD-10-CM | POA: Diagnosis not present

## 2020-09-07 DIAGNOSIS — H6123 Impacted cerumen, bilateral: Secondary | ICD-10-CM | POA: Diagnosis not present

## 2020-09-26 DIAGNOSIS — R059 Cough, unspecified: Secondary | ICD-10-CM | POA: Diagnosis not present

## 2020-09-26 DIAGNOSIS — L0390 Cellulitis, unspecified: Secondary | ICD-10-CM | POA: Diagnosis not present

## 2020-09-26 DIAGNOSIS — N3090 Cystitis, unspecified without hematuria: Secondary | ICD-10-CM | POA: Diagnosis not present

## 2020-09-26 DIAGNOSIS — H6123 Impacted cerumen, bilateral: Secondary | ICD-10-CM | POA: Diagnosis not present

## 2020-09-26 DIAGNOSIS — E1165 Type 2 diabetes mellitus with hyperglycemia: Secondary | ICD-10-CM | POA: Diagnosis not present

## 2020-10-02 DIAGNOSIS — Z87440 Personal history of urinary (tract) infections: Secondary | ICD-10-CM | POA: Diagnosis not present

## 2020-10-04 DIAGNOSIS — E119 Type 2 diabetes mellitus without complications: Secondary | ICD-10-CM | POA: Diagnosis not present

## 2020-10-04 DIAGNOSIS — Z5181 Encounter for therapeutic drug level monitoring: Secondary | ICD-10-CM | POA: Diagnosis not present

## 2020-10-04 DIAGNOSIS — E1121 Type 2 diabetes mellitus with diabetic nephropathy: Secondary | ICD-10-CM | POA: Diagnosis not present

## 2020-10-04 DIAGNOSIS — E11649 Type 2 diabetes mellitus with hypoglycemia without coma: Secondary | ICD-10-CM | POA: Diagnosis not present

## 2020-10-10 DIAGNOSIS — Z87440 Personal history of urinary (tract) infections: Secondary | ICD-10-CM | POA: Diagnosis not present

## 2020-10-16 DIAGNOSIS — Z01818 Encounter for other preprocedural examination: Secondary | ICD-10-CM | POA: Diagnosis not present

## 2020-10-16 DIAGNOSIS — Z1159 Encounter for screening for other viral diseases: Secondary | ICD-10-CM | POA: Diagnosis not present

## 2020-10-27 DIAGNOSIS — N2581 Secondary hyperparathyroidism of renal origin: Secondary | ICD-10-CM | POA: Diagnosis not present

## 2020-10-27 DIAGNOSIS — N183 Chronic kidney disease, stage 3 unspecified: Secondary | ICD-10-CM | POA: Diagnosis not present

## 2020-10-27 DIAGNOSIS — E875 Hyperkalemia: Secondary | ICD-10-CM | POA: Diagnosis not present

## 2020-10-27 DIAGNOSIS — D631 Anemia in chronic kidney disease: Secondary | ICD-10-CM | POA: Diagnosis not present

## 2020-10-27 DIAGNOSIS — I10 Essential (primary) hypertension: Secondary | ICD-10-CM | POA: Diagnosis not present

## 2020-10-30 DIAGNOSIS — Z01818 Encounter for other preprocedural examination: Secondary | ICD-10-CM | POA: Diagnosis not present

## 2020-10-30 DIAGNOSIS — I10 Essential (primary) hypertension: Secondary | ICD-10-CM | POA: Diagnosis not present

## 2020-10-30 DIAGNOSIS — Z1159 Encounter for screening for other viral diseases: Secondary | ICD-10-CM | POA: Diagnosis not present

## 2020-10-30 DIAGNOSIS — N2581 Secondary hyperparathyroidism of renal origin: Secondary | ICD-10-CM | POA: Diagnosis not present

## 2020-10-30 DIAGNOSIS — D631 Anemia in chronic kidney disease: Secondary | ICD-10-CM | POA: Diagnosis not present

## 2020-10-30 DIAGNOSIS — Z87440 Personal history of urinary (tract) infections: Secondary | ICD-10-CM | POA: Diagnosis not present

## 2020-10-30 DIAGNOSIS — N281 Cyst of kidney, acquired: Secondary | ICD-10-CM | POA: Diagnosis not present

## 2020-10-30 DIAGNOSIS — N183 Chronic kidney disease, stage 3 unspecified: Secondary | ICD-10-CM | POA: Diagnosis not present

## 2020-11-14 DIAGNOSIS — L603 Nail dystrophy: Secondary | ICD-10-CM | POA: Diagnosis not present

## 2020-11-14 DIAGNOSIS — E1159 Type 2 diabetes mellitus with other circulatory complications: Secondary | ICD-10-CM | POA: Diagnosis not present

## 2020-11-20 DIAGNOSIS — N2581 Secondary hyperparathyroidism of renal origin: Secondary | ICD-10-CM | POA: Diagnosis not present

## 2020-11-20 DIAGNOSIS — D631 Anemia in chronic kidney disease: Secondary | ICD-10-CM | POA: Diagnosis not present

## 2020-11-20 DIAGNOSIS — N183 Chronic kidney disease, stage 3 unspecified: Secondary | ICD-10-CM | POA: Diagnosis not present

## 2020-11-20 DIAGNOSIS — I10 Essential (primary) hypertension: Secondary | ICD-10-CM | POA: Diagnosis not present

## 2020-11-20 DIAGNOSIS — N281 Cyst of kidney, acquired: Secondary | ICD-10-CM | POA: Diagnosis not present

## 2020-11-24 DIAGNOSIS — I4891 Unspecified atrial fibrillation: Secondary | ICD-10-CM | POA: Diagnosis not present

## 2020-11-24 DIAGNOSIS — I639 Cerebral infarction, unspecified: Secondary | ICD-10-CM | POA: Diagnosis not present

## 2020-12-07 DIAGNOSIS — I70223 Atherosclerosis of native arteries of extremities with rest pain, bilateral legs: Secondary | ICD-10-CM | POA: Diagnosis not present

## 2020-12-07 DIAGNOSIS — I10 Essential (primary) hypertension: Secondary | ICD-10-CM | POA: Diagnosis not present

## 2020-12-22 DIAGNOSIS — N2581 Secondary hyperparathyroidism of renal origin: Secondary | ICD-10-CM | POA: Diagnosis not present

## 2020-12-22 DIAGNOSIS — N281 Cyst of kidney, acquired: Secondary | ICD-10-CM | POA: Diagnosis not present

## 2020-12-22 DIAGNOSIS — D631 Anemia in chronic kidney disease: Secondary | ICD-10-CM | POA: Diagnosis not present

## 2020-12-22 DIAGNOSIS — N183 Chronic kidney disease, stage 3 unspecified: Secondary | ICD-10-CM | POA: Diagnosis not present

## 2020-12-22 DIAGNOSIS — I10 Essential (primary) hypertension: Secondary | ICD-10-CM | POA: Diagnosis not present

## 2021-01-01 DIAGNOSIS — H9190 Unspecified hearing loss, unspecified ear: Secondary | ICD-10-CM | POA: Diagnosis not present

## 2021-01-09 DIAGNOSIS — N183 Chronic kidney disease, stage 3 unspecified: Secondary | ICD-10-CM | POA: Diagnosis not present

## 2021-01-09 DIAGNOSIS — E1165 Type 2 diabetes mellitus with hyperglycemia: Secondary | ICD-10-CM | POA: Diagnosis not present

## 2021-01-15 DIAGNOSIS — M940 Chondrocostal junction syndrome [Tietze]: Secondary | ICD-10-CM | POA: Diagnosis not present

## 2021-01-17 ENCOUNTER — Emergency Department
Admission: EM | Admit: 2021-01-17 | Discharge: 2021-01-17 | Disposition: A | Discharge status: Home / Self Care | Payer: No Typology Code available for payment source | Attending: Student in an Organized Health Care Education/Training Program | Admitting: Student in an Organized Health Care Education/Training Program

## 2021-01-17 ENCOUNTER — Emergency Department: Payer: No Typology Code available for payment source

## 2021-01-17 DIAGNOSIS — Y9301 Activity, walking, marching and hiking: Secondary | ICD-10-CM | POA: Insufficient documentation

## 2021-01-17 DIAGNOSIS — W101XXA Fall (on)(from) sidewalk curb, initial encounter: Secondary | ICD-10-CM | POA: Insufficient documentation

## 2021-01-17 DIAGNOSIS — S52502A Unspecified fracture of the lower end of left radius, initial encounter for closed fracture: Secondary | ICD-10-CM

## 2021-01-17 DIAGNOSIS — S0990XA Unspecified injury of head, initial encounter: Secondary | ICD-10-CM | POA: Diagnosis not present

## 2021-01-17 DIAGNOSIS — S52592A Other fractures of lower end of left radius, initial encounter for closed fracture: Secondary | ICD-10-CM | POA: Diagnosis not present

## 2021-01-17 MED ORDER — OXYCODONE HCL 5 MG PO TABS
2.5000 mg | ORAL_TABLET | Freq: Four times a day (QID) | ORAL | 0 refills | Status: AC | PRN
Start: 2021-01-17 — End: 2021-01-25

## 2021-01-17 MED ORDER — ACETAMINOPHEN 325 MG PO TABS
650.0000 mg | ORAL_TABLET | Freq: Four times a day (QID) | ORAL | 0 refills | Status: DC | PRN
Start: 2021-01-17 — End: 2021-06-25

## 2021-01-17 NOTE — ED Provider Notes (Signed)
EMERGENCY DEPARTMENT NOTE     Patient initially seen and examined at   ED PHYSICIAN ASSIGNED       Date/Time Event User Comments    01/17/21 1623 Physician Assigned Loura Pardon, Olubunmi Rothenberger, Hollis Tuller, MD assigned as Attending           ED MIDLEVEL (APP) ASSIGNED       None            HISTORY OF PRESENT ILLNESS   Independent Historian:  Translator Used:     Chief Complaint: Fall       87 y.o. female presents with left arm pain after a fall.  Patient states that she tripped over a curb try to get into a vehicle.  States that she braced herself with her left arm and tried to grab the handle of the vehicle when she felt like she heard a pop.  States that she had some associated wrist pain that is worse with movement with associated swelling since then.  States that she struck her head on the floor via but notes that she had a hat on and denies any injury or loss of consciousness.  States that she is currently anticoagulated with Xarelto.  Denies any vision changes, headache, weakness, numbness or tingling.      MEDICAL HISTORY     Past Medical History:  Past Medical History:   Diagnosis Date    Angioedema     Diabetes mellitus     Hypertension        Past Surgical History:  No past surgical history on file.    Social History:  Social History     Socioeconomic History    Marital status: Divorced   Tobacco Use    Smoking status: Never    Smokeless tobacco: Never   Vaping Use    Vaping Use: Never used   Substance and Sexual Activity    Alcohol use: No    Drug use: No       Family History:  No family history on file.    Outpatient Medication:  Discharge Medication List as of 01/17/2021  4:50 PM        CONTINUE these medications which have NOT CHANGED    Details   Cholecalciferol (VITAMIN D) 1000 UNIT tablet Take 1,000 Units by mouth daily., Until Discontinued, Historical Med      cloNIDine (CATAPRES) 0.2 MG tablet Take 0.2 mg by mouth 2 (two) times daily., Until Discontinued, Historical Med      dilTIAZem  (CARDIZEM) 60 MG tablet Take 1 tablet (60 mg total) by mouth 4 (four) times daily, Starting Thu 06/05/2017, Print      Diltiazem HCl ER Beads (TIAZAC PO) Take by mouth., Until Discontinued, Historical Med      esomeprazole (NEXIUM) 40 MG capsule Take 40 mg by mouth every morning before breakfast., Until Discontinued, Historical Med      ferrous sulfate 325 (65 FE) MG tablet Take 325 mg by mouth every morning with breakfast., Until Discontinued, Historical Med      insulin aspart (NOVOLOG 70/30) (70-30) 100 UNIT/ML injection Inject into the skin 2 (two) times daily before meals., Until Discontinued, Historical Med      insulin detemir (LEVEMIR) 100 UNIT/ML injection Inject into the skin., Until Discontinued, Historical Med      metFORMIN (GLUCOPHAGE) 850 MG tablet Take 850 mg by mouth 2 (two) times daily with meals., Until Discontinued, Historical Med      Rivaroxaban (XARELTO PO) Take by mouth., Until  Discontinued, Historical Med      vitamin B-12 (CYANOCOBALAMIN) 500 MCG tablet Take 500 mcg by mouth daily., Until Discontinued, Historical Med               REVIEW OF SYSTEMS   Review of Systems    10/14 Review of Systems completed and negative except as stated above in the HPI   Reviewed: Constitutional, HENT, Eyes, Resp, CV, GI, GU, MSK, Skin, Neuro, Psych  PHYSICAL EXAM     ED Triage Vitals [01/17/21 1511]   Enc Vitals Group      BP 161/85      Heart Rate 90      Resp Rate 18      Temp 99 F (37.2 C)      Temp Source Oral      SpO2 98 %      Weight 101.6 kg      Height 1.676 m      Head Circumference       Peak Flow       Pain Score 6      Pain Loc       Pain Edu?       Excl. in [place]?          Constitutional: Appears stated age  Eyes: Pupils equal, non-icteric  HENT: NC/AT, no otorrhea, no rhinorrhea, MMM   CV:  Appears well perfused, RRR, radial pulse intact in the bilateral extremities with intact cap refill  Resp: Normal effort, clear bilaterally  GI: Non-distended, soft, no tenderness  MSK: Left wrist with mild  edema and tenderness over the distal radial head, range of motion at the wrist limited secondary to pain, range of motion of the digits intact with grip strength slightly less than the right extremity due to pain in the wrist, normal range of motion and strength in the elbow and shoulder  Neuro: A+Ox3, moving extremities spontaneous   Psych: Intact judgement and insight  Skin: Warm, dry      MEDICAL DECISION MAKING     PRIMARY PROBLEM LIST     Acute Uncomplicated Moderate left distal radial fracture  Chronic Illness Impacting Care of the above problem: Advanced age Increases the risk of poor wound healing  Differential Diagnosis: Extremity Swelling: fracture, dislocation, contusion, bursitis, hematoma, tendon rupture, strain/sprain, DVT, superficial thrombophlebitis             DISCUSSION      87 year old female presenting with left distal radial fracture.  Otherwise neuro intact.  CT head was performed given head injury in the setting of anticoagulation and was unremarkable.  Patient did not tolerate sugar-tong splint and so switched to forearm splint given the distal nondisplaced nature of the fracture.  Discussed close outpatient follow-up with orthopedics.  Provided clinic information.  Discussed follow-up with her PCP.  She showed good understanding.  Discharged in stable condition.    I do not suspect severe sepsis or septic shock        Additional Notes                     Vital Signs: Reviewed the patient's vital signs.   Nursing Notes: Reviewed and utilized available nursing notes.  Medical Records Reviewed: Reviewed available past medical records.  Counseling: The emergency provider has spoken with the patient and discussed today's findings, in addition to providing specific details for the plan of care.  Questions are answered and there is agreement with the plan.  MIPS DOCUMENTATION          HEAD TRAUMA Indications for head CT due to trauma : Age 33 or older    CARDIAC STUDIES      The following  cardiac studies were independently interpreted by the ED Physician.  For full cardiac study results please see chart.        EMERGENCY IMAGING STUDIES    The following imagine studies were independently interpreted by me (emergency physician):    The following imagine studies were independently interpreted by me (emergency physician):    CT Head interpreted independently by Maleeha Halls, MD (ED physician):  Findings/Impression: No acute intracranial hemorrhage      Left wrist xrays interpreted independently by Demba Nigh, MD: Findings/Impression: acute distal radial fracture fracture but no significant displacement or dislocation      RADIOLOGY IMAGING STUDIES      CT Head without Contrast   Final Result       No hemorrhage or acute abnormality. Stable chronic changes.      Heron Nay, MD    01/17/2021 4:16 PM      Wrist Left PA Lateral and Oblique   Final Result   FINDINGS/   There is a nondisplaced fracture of the distal radius with associated   soft tissue swelling. No dislocation.      Ewing Schlein MD, MD    01/17/2021 4:06 PM          EMERGENCY DEPT. MEDICATIONS      ED Medication Orders (From admission, onward)      None            LABORATORY RESULTS    Ordered and independently interpreted AVAILABLE laboratory tests.   Results       ** No results found for the last 24 hours. **              CRITICAL CARE/PROCEDURES    Procedures    DIAGNOSIS      Diagnosis:  Final diagnoses:   Closed fracture of distal end of left radius, unspecified fracture morphology, initial encounter       Disposition:  ED Disposition       ED Disposition   Discharge    Condition   --    Date/Time   Wed Jan 17, 2021  4:41 PM    Comment   KATALENA MALVEAUX discharge to home/self care.    Condition at disposition: Stable                 Prescriptions:  Discharge Medication List as of 01/17/2021  4:50 PM        START taking these medications    Details   acetaminophen (TYLENOL) 325 MG tablet Take 2 tablets (650 mg) by  mouth every 6 (six) hours as needed for Pain, Starting Wed 01/17/2021, E-Rx      oxyCODONE (ROXICODONE) 5 MG immediate release tablet Take 0.5 tablets (2.5 mg) by mouth every 6 (six) hours as needed for Pain, Starting Wed 01/17/2021, Until Thu 01/25/2021 at 2359, E-Rx           CONTINUE these medications which have NOT CHANGED    Details   Cholecalciferol (VITAMIN D) 1000 UNIT tablet Take 1,000 Units by mouth daily., Until Discontinued, Historical Med      cloNIDine (CATAPRES) 0.2 MG tablet Take 0.2 mg by mouth 2 (two) times daily., Until Discontinued, Historical Med      dilTIAZem (CARDIZEM) 60 MG tablet Take 1  tablet (60 mg total) by mouth 4 (four) times daily, Starting Thu 06/05/2017, Print      Diltiazem HCl ER Beads (TIAZAC PO) Take by mouth., Until Discontinued, Historical Med      esomeprazole (NEXIUM) 40 MG capsule Take 40 mg by mouth every morning before breakfast., Until Discontinued, Historical Med      ferrous sulfate 325 (65 FE) MG tablet Take 325 mg by mouth every morning with breakfast., Until Discontinued, Historical Med      insulin aspart (NOVOLOG 70/30) (70-30) 100 UNIT/ML injection Inject into the skin 2 (two) times daily before meals., Until Discontinued, Historical Med      insulin detemir (LEVEMIR) 100 UNIT/ML injection Inject into the skin., Until Discontinued, Historical Med      metFORMIN (GLUCOPHAGE) 850 MG tablet Take 850 mg by mouth 2 (two) times daily with meals., Until Discontinued, Historical Med      Rivaroxaban (XARELTO PO) Take by mouth., Until Discontinued, Historical Med      vitamin B-12 (CYANOCOBALAMIN) 500 MCG tablet Take 500 mcg by mouth daily., Until Discontinued, Historical Med             This note was generated by the Epic EMR system/ Dragon speech recognition and may contain inherent errors or omissions not intended by the user. Grammatical errors, random word insertions, deletions and pronoun errors  are occasional consequences of this technology due to software  limitations. Not all errors are caught or corrected. If there are questions or concerns about the content of this note or information contained within the body of this dictation they should be addressed directly with the author for clarification.       Wesam Gearhart, MD  01/25/21 1116

## 2021-01-20 DIAGNOSIS — S52502D Unspecified fracture of the lower end of left radius, subsequent encounter for closed fracture with routine healing: Secondary | ICD-10-CM | POA: Diagnosis not present

## 2021-01-23 DIAGNOSIS — E1159 Type 2 diabetes mellitus with other circulatory complications: Secondary | ICD-10-CM | POA: Diagnosis not present

## 2021-01-23 DIAGNOSIS — L608 Other nail disorders: Secondary | ICD-10-CM | POA: Diagnosis not present

## 2021-01-24 DIAGNOSIS — R42 Dizziness and giddiness: Secondary | ICD-10-CM | POA: Diagnosis not present

## 2021-01-24 DIAGNOSIS — S6292XS Unspecified fracture of left wrist and hand, sequela: Secondary | ICD-10-CM | POA: Diagnosis not present

## 2021-01-26 ENCOUNTER — Emergency Department: Payer: No Typology Code available for payment source

## 2021-01-26 ENCOUNTER — Emergency Department
Admission: EM | Admit: 2021-01-26 | Discharge: 2021-01-26 | Disposition: A | Discharge status: Home / Self Care | Payer: No Typology Code available for payment source | Attending: Emergency Medicine | Admitting: Emergency Medicine

## 2021-01-26 DIAGNOSIS — S52502A Unspecified fracture of the lower end of left radius, initial encounter for closed fracture: Secondary | ICD-10-CM

## 2021-01-26 DIAGNOSIS — S52592A Other fractures of lower end of left radius, initial encounter for closed fracture: Secondary | ICD-10-CM | POA: Insufficient documentation

## 2021-01-26 DIAGNOSIS — X58XXXA Exposure to other specified factors, initial encounter: Secondary | ICD-10-CM | POA: Insufficient documentation

## 2021-01-26 DIAGNOSIS — M7989 Other specified soft tissue disorders: Secondary | ICD-10-CM | POA: Diagnosis not present

## 2021-01-26 NOTE — ED Provider Notes (Shared)
EMERGENCY DEPARTMENT NOTE     Patient initially seen and examined at   ED PHYSICIAN ASSIGNED       Date/Time Event User Comments    01/26/21 1733 Physician Assigned Karenann Cai, Jacelyn Pi, MD assigned as Attending     HISTORY OF PRESENT ILLNESS   Independent Historian:No  Translator Used: no    Chief Complaint: Hand Pain (swelling)     88 y.o. female with past medical history as below     Time seen: 1824    Historian: patient and daughter    Chief complaint: swelling and numbness of fingers    HPI: the patient presented here on 01/17/21 with a left wrist   injury; a nondisplaced distal radius fx was diagnosed and a   splint was applied; she went to an [facility] because her fingers   have become swollen and numb; she has not loosened the ACE   holding the splint since it was applied; she is not wearing a sling;   she has not f/u'ed with an orthopedist     Time of onset: see above    Severity: moderate swelling and numbness    Quality: no pain in fingers - mild aching pain in wrist    Improved by: nothing    Worsened by: nothing    Accompanied by: no fever/chills    Complaint experienced before: no     MEDICAL HISTORY     Past Medical History:  Past Medical History:   Diagnosis Date    Angioedema     Diabetes mellitus     Hypertension      Past Surgical History:  No past surgical history on file.    Social History:  Social History     Socioeconomic History    Marital status: Divorced   Tobacco Use    Smoking status: Never    Smokeless tobacco: Never   Vaping Use    Vaping Use: Never used   Substance and Sexual Activity    Alcohol use: No    Drug use: No     Family History:  No family history on file.    Outpatient Medication:  Previous Medications    ACETAMINOPHEN (TYLENOL) 325 MG TABLET    Take 2 tablets (650 mg) by mouth every 6 (six) hours as needed for Pain    CHOLECALCIFEROL (VITAMIN D) 1000 UNIT TABLET    Take 1,000 Units by mouth daily.    CLONIDINE (CATAPRES) 0.2 MG TABLET    Take 0.2 mg by mouth 2  (two) times daily.    DILTIAZEM (CARDIZEM) 60 MG TABLET    Take 1 tablet (60 mg total) by mouth 4 (four) times daily    DILTIAZEM HCL ER BEADS (TIAZAC PO)    Take by mouth.    ESOMEPRAZOLE (NEXIUM) 40 MG CAPSULE    Take 40 mg by mouth every morning before breakfast.    FERROUS SULFATE 325 (65 FE) MG TABLET    Take 325 mg by mouth every morning with breakfast.    INSULIN ASPART (NOVOLOG 70/30) (70-30) 100 UNIT/ML INJECTION    Inject into the skin 2 (two) times daily before meals.    INSULIN DETEMIR (LEVEMIR) 100 UNIT/ML INJECTION    Inject into the skin.    METFORMIN (GLUCOPHAGE) 850 MG TABLET    Take 850 mg by mouth 2 (two) times daily with meals.    RIVAROXABAN (XARELTO PO)    Take by mouth.    VITAMIN B-12 (CYANOCOBALAMIN)  500 MCG TABLET    Take 500 mcg by mouth daily.     REVIEW OF SYSTEMS   Review of Systems   Constitutional:  Negative for chills, diaphoresis and fever.   Musculoskeletal:  Positive for arthralgias and joint swelling.   Skin:  Negative for color change, pallor, rash and wound.   Neurological:  Positive for numbness. Negative for weakness.      PHYSICAL EXAM     ED Triage Vitals [01/26/21 1732]   Enc Vitals Group      BP 182/80      Heart Rate 73      Resp Rate 18      Temp 98.2 F (36.8 C)      Temp Source Oral      SpO2 100 %      Weight 97.5 kg      Height 1.549 m      Head Circumference       Peak Flow       Pain Score 0      Pain Loc       Pain Edu?       Excl. in GC?      Physical Exam  Vitals and nursing note reviewed.   Constitutional:       General: She is awake. She is not in acute distress.     Appearance: Normal appearance. She is well-groomed. She is not ill-appearing, toxic-appearing or diaphoretic.   Musculoskeletal:         General: Swelling and signs of injury present. No tenderness or deformity.      Comments: Left wrist with splint/ACE in place, with moderately swollen fingers   with good cap refill, with mild swelling and moderate tenderness   of distal radius, with normal  left elbow, with no skin breaks; [id number]     Neurological:      General: No focal deficit present.      Mental Status: She is alert and oriented to person, place, and time.      Motor: No weakness.   Psychiatric:         Mood and Affect: Mood normal.         Behavior: Behavior normal. Behavior is cooperative.         Thought Content: Thought content normal.         Judgment: Judgment normal.      MEDICAL DECISION MAKING     Swelling and numbness of fingers in splint    DD: overly tight splint; lack of sling with dependent edema; DVT;   worsening of fracture    DISCUSSION      A discussion was held with the daughter and patient about the results   of the examination and the studies done and about the implications   of these results. A treatment and follow-up plan was developed and   agreed upon with the daughter and patient     A new splint was applied by the ED tech at my direction;  I checked the splint after the application; it was satisfactory  with good capillary refill and no inordinate discomfort. A splint  was also provided     I do not suspect severe sepsis or septic shock    Additional Notes       Vital Signs: Reviewed the patient's vital signs.   Nursing Notes: Reviewed and utilized available nursing notes.  Medical Records Reviewed: Reviewed available past medical records.  Counseling: The emergency provider has  spoken with the patient and discussed today's findings, in addition to providing specific details for the plan of care.  Questions are answered and there is agreement with the plan.      MIPS DOCUMENTATION      CARDIAC STUDIES      EMERGENCY IMAGING STUDIES    The following imagine studies were independently interpreted by me (emergency medicine provider):    Radiology:  Exam interpreted by me (ED Provider)  Exam: Left Wrist  Xray  Findings: Non displaced fracture of distal left radius  Impression: No changes from previous x-ray.     RADIOLOGY IMAGING STUDIES      XR Wrist Left PA Lateral And  Oblique   Final Result    Stable appearance of nondisplaced distal radial metaphyseal   fracture. There is overlying soft tissue swelling.          Wyatt Portela, MD    01/26/2021 7:09 PM        EMERGENCY DEPT. MEDICATIONS      LABORATORY RESULTS      CRITICAL CARE/PROCEDURES    Procedures    DIAGNOSIS      Diagnosis:  Final diagnoses:   Nondisplaced fracture of distal end of left radius - subsequent visit  Visit for reapplication of splint for subacute fracture     Disposition:  ED Disposition       ED Disposition   Discharge    Condition   --    Date/Time   Fri Jan 26, 2021  8:02 PM    Comment   Emily Stevens discharge to home/self care.    Condition at disposition: Stable               Prescriptions:  Patient's Medications   New Prescriptions    No medications on file   Previous Medications    ACETAMINOPHEN (TYLENOL) 325 MG TABLET    Take 2 tablets (650 mg) by mouth every 6 (six) hours as needed for Pain    CHOLECALCIFEROL (VITAMIN D) 1000 UNIT TABLET    Take 1,000 Units by mouth daily.    CLONIDINE (CATAPRES) 0.2 MG TABLET    Take 0.2 mg by mouth 2 (two) times daily.    DILTIAZEM (CARDIZEM) 60 MG TABLET    Take 1 tablet (60 mg total) by mouth 4 (four) times daily    DILTIAZEM HCL ER BEADS (TIAZAC PO)    Take by mouth.    ESOMEPRAZOLE (NEXIUM) 40 MG CAPSULE    Take 40 mg by mouth every morning before breakfast.    FERROUS SULFATE 325 (65 FE) MG TABLET    Take 325 mg by mouth every morning with breakfast.    INSULIN ASPART (NOVOLOG 70/30) (70-30) 100 UNIT/ML INJECTION    Inject into the skin 2 (two) times daily before meals.    INSULIN DETEMIR (LEVEMIR) 100 UNIT/ML INJECTION    Inject into the skin.    METFORMIN (GLUCOPHAGE) 850 MG TABLET    Take 850 mg by mouth 2 (two) times daily with meals.    RIVAROXABAN (XARELTO PO)    Take by mouth.    VITAMIN B-12 (CYANOCOBALAMIN) 500 MCG TABLET    Take 500 mcg by mouth daily.   Modified Medications    No medications on file   Discontinued Medications    No medications on file      This note was generated by the Epic EMR system/ Dragon speech recognition and may contain inherent errors or omissions not intended  by the user. Grammatical errors, random word insertions, deletions and pronoun errors  are occasional consequences of this technology due to software limitations. Not all errors are caught or corrected. If there are questions or concerns about the content of this note or information contained within the body of this dictation they should be addressed directly with the author for clarification.        Terri Skains, MD  01/29/21 2133

## 2021-01-26 NOTE — Discharge Instructions (Signed)
Study results to go  Activity as tolerated  Splint and sling  Advil/Motrin/Ibuprofen - 400 mg every eight hours for pain  Acetaminophen/Tylenol - 500 mg every four hours for pain  Continue current medications  Fluids - keep hydrated  Return if you are not improving or are worse

## 2021-01-30 DIAGNOSIS — S52592A Other fractures of lower end of left radius, initial encounter for closed fracture: Secondary | ICD-10-CM | POA: Diagnosis not present

## 2021-01-30 DIAGNOSIS — W19XXXA Unspecified fall, initial encounter: Secondary | ICD-10-CM | POA: Diagnosis not present

## 2021-02-03 DIAGNOSIS — R059 Cough, unspecified: Secondary | ICD-10-CM | POA: Diagnosis not present

## 2021-02-03 DIAGNOSIS — Z20822 Contact with and (suspected) exposure to covid-19: Secondary | ICD-10-CM | POA: Diagnosis not present

## 2021-02-03 DIAGNOSIS — S52502D Unspecified fracture of the lower end of left radius, subsequent encounter for closed fracture with routine healing: Secondary | ICD-10-CM | POA: Diagnosis not present

## 2021-02-08 DIAGNOSIS — E1121 Type 2 diabetes mellitus with diabetic nephropathy: Secondary | ICD-10-CM | POA: Diagnosis not present

## 2021-02-08 DIAGNOSIS — Z833 Family history of diabetes mellitus: Secondary | ICD-10-CM | POA: Diagnosis not present

## 2021-02-08 DIAGNOSIS — Z5181 Encounter for therapeutic drug level monitoring: Secondary | ICD-10-CM | POA: Diagnosis not present

## 2021-02-08 DIAGNOSIS — Z794 Long term (current) use of insulin: Secondary | ICD-10-CM | POA: Diagnosis not present

## 2021-02-08 DIAGNOSIS — E119 Type 2 diabetes mellitus without complications: Secondary | ICD-10-CM | POA: Diagnosis not present

## 2021-02-08 DIAGNOSIS — E11649 Type 2 diabetes mellitus with hypoglycemia without coma: Secondary | ICD-10-CM | POA: Diagnosis not present

## 2021-02-13 DIAGNOSIS — S52502A Unspecified fracture of the lower end of left radius, initial encounter for closed fracture: Secondary | ICD-10-CM | POA: Diagnosis not present

## 2021-02-13 DIAGNOSIS — E1165 Type 2 diabetes mellitus with hyperglycemia: Secondary | ICD-10-CM | POA: Diagnosis not present

## 2021-02-13 DIAGNOSIS — N183 Chronic kidney disease, stage 3 unspecified: Secondary | ICD-10-CM | POA: Diagnosis not present

## 2021-02-17 ENCOUNTER — Emergency Department: Payer: No Typology Code available for payment source

## 2021-02-17 ENCOUNTER — Emergency Department
Admission: EM | Admit: 2021-02-17 | Discharge: 2021-02-17 | Disposition: A | Discharge status: Home / Self Care | Payer: No Typology Code available for payment source | Attending: Student in an Organized Health Care Education/Training Program | Admitting: Student in an Organized Health Care Education/Training Program

## 2021-02-17 DIAGNOSIS — R0789 Other chest pain: Secondary | ICD-10-CM | POA: Insufficient documentation

## 2021-02-17 DIAGNOSIS — U071 COVID-19: Secondary | ICD-10-CM | POA: Insufficient documentation

## 2021-02-17 DIAGNOSIS — R0602 Shortness of breath: Secondary | ICD-10-CM | POA: Diagnosis not present

## 2021-02-17 LAB — HIGH SENSITIVITY TROPONIN-I WITH DELTA
hs Troponin-I Delta: -0.3 ng/L
hs Troponin-I: 22.5 ng/L — AB

## 2021-02-17 LAB — CBC AND DIFFERENTIAL
Absolute NRBC: 0 10*3/uL (ref 0.00–0.00)
Basophils Absolute Automated: 0.03 10*3/uL (ref 0.00–0.08)
Basophils Automated: 0.5 %
Eosinophils Absolute Automated: 0.12 10*3/uL (ref 0.00–0.44)
Eosinophils Automated: 1.9 %
Hematocrit: 35.2 % (ref 34.7–43.7)
Hgb: 11.3 g/dL — ABNORMAL LOW (ref 11.4–14.8)
Immature Granulocytes Absolute: 0.01 10*3/uL (ref 0.00–0.07)
Immature Granulocytes: 0.2 %
Instrument Absolute Neutrophil Count: 1.89 10*3/uL (ref 1.10–6.33)
Lymphocytes Absolute Automated: 2.82 10*3/uL (ref 0.42–3.22)
Lymphocytes Automated: 43.9 %
MCH: 26.4 pg (ref 25.1–33.5)
MCHC: 32.1 g/dL (ref 31.5–35.8)
MCV: 82.2 fL (ref 78.0–96.0)
MPV: 10 fL (ref 8.9–12.5)
Monocytes Absolute Automated: 1.56 10*3/uL — ABNORMAL HIGH (ref 0.21–0.85)
Monocytes: 24.3 %
Neutrophils Absolute: 1.89 10*3/uL (ref 1.10–6.33)
Neutrophils: 29.2 %
Nucleated RBC: 0 /100 WBC (ref 0.0–0.0)
Platelets: 334 10*3/uL (ref 142–346)
RBC: 4.28 10*6/uL (ref 3.90–5.10)
RDW: 17 % — ABNORMAL HIGH (ref 11–15)
WBC: 6.43 10*3/uL (ref 3.10–9.50)

## 2021-02-17 LAB — COMPREHENSIVE METABOLIC PANEL
ALT: 25 U/L (ref 0–55)
AST (SGOT): 34 U/L (ref 5–41)
Albumin/Globulin Ratio: 0.9 (ref 0.9–2.2)
Albumin: 3.1 g/dL — ABNORMAL LOW (ref 3.5–5.0)
Alkaline Phosphatase: 64 U/L (ref 37–117)
Anion Gap: 10 (ref 5.0–15.0)
BUN: 30 mg/dL — ABNORMAL HIGH (ref 7.0–21.0)
Bilirubin, Total: 0.2 mg/dL (ref 0.2–1.2)
CO2: 27 mEq/L (ref 17–29)
Calcium: 8.9 mg/dL (ref 7.9–10.2)
Chloride: 103 mEq/L (ref 99–111)
Creatinine: 1.4 mg/dL — ABNORMAL HIGH (ref 0.4–1.0)
Globulin: 3.4 g/dL (ref 2.0–3.6)
Glucose: 138 mg/dL — ABNORMAL HIGH (ref 70–100)
Potassium: 4.3 mEq/L (ref 3.5–5.3)
Protein, Total: 6.5 g/dL (ref 6.0–8.3)
Sodium: 140 mEq/L (ref 135–145)

## 2021-02-17 LAB — COVID-19 (SARS-COV-2) & INFLUENZA  A/B, NAA (ROCHE LIAT)
Influenza A: NOT DETECTED
Influenza B: NOT DETECTED
SARS CoV 2 Overall Result: DETECTED — AB

## 2021-02-17 LAB — HIGH SENSITIVITY TROPONIN-I: hs Troponin-I: 22.8 ng/L — AB

## 2021-02-17 LAB — GFR: EGFR: 43.1

## 2021-02-17 MED ORDER — MOLNUPIRAVIR 200 MG PO CAPS
800.0000 mg | ORAL_CAPSULE | Freq: Two times a day (BID) | ORAL | 0 refills | Status: AC
Start: 2021-02-17 — End: 2021-02-22

## 2021-02-17 MED ORDER — LIDOCAINE 5 % EX PTCH
1.0000 | MEDICATED_PATCH | Freq: Once | CUTANEOUS | Status: DC
Start: 2021-02-17 — End: 2021-02-18
  Administered 2021-02-17: 1 via TRANSDERMAL
  Filled 2021-02-17: qty 1

## 2021-02-17 MED ORDER — LIDOCAINE 5 % EX PTCH
1.0000 | MEDICATED_PATCH | CUTANEOUS | 0 refills | Status: DC
Start: 2021-02-17 — End: 2021-10-22

## 2021-02-17 NOTE — ED Provider Notes (Signed)
EMERGENCY DEPARTMENT NOTE     Patient initially seen and examined at   ED PHYSICIAN ASSIGNED       Date/Time Event User Comments    02/17/21 1719 Physician Assigned Laurynn Mccorvey Lynard Postlewait, MD assigned as Attending             HISTORY OF PRESENT ILLNESS   Independent Historian: Patient    Chief Complaint: Chest Pain, Abdominal Pain, Emesis, and Nausea       88 y.o. female presents with complaints of left-sided chest pain as well as some mild nausea, abdominal pain and episode of vomiting.  States her symptoms started today although notes chest pain has been going on for 2 days at this point.  States she has tried Tylenol with some relief.  Notes that she is also started having occasional cough.  States her chest pain is worse when she moves her arms or with body positioning.  Currently denies any shortness of breath, or having other symptoms such as fevers or diarrhea.    MEDICAL HISTORY     Past Medical History:  Past Medical History:   Diagnosis Date    Angioedema     Diabetes mellitus     Hypertension     Past Surgical History:  History reviewed. No pertinent surgical history.   Family History:  History reviewed. No pertinent family history. Social History:  Social History     Socioeconomic History    Marital status: Divorced   Tobacco Use    Smoking status: Never    Smokeless tobacco: Never   Vaping Use    Vaping Use: Never used   Substance and Sexual Activity    Alcohol use: No    Drug use: No        Outpatient Medication:  Discharge Medication List as of 02/17/2021  9:37 PM        CONTINUE these medications which have NOT CHANGED    Details   acetaminophen (TYLENOL) 325 MG tablet Take 2 tablets (650 mg) by mouth every 6 (six) hours as needed for Pain, Starting Wed 01/17/2021, E-Rx      Cholecalciferol (VITAMIN D) 1000 UNIT tablet Take 1,000 Units by mouth daily., Until Discontinued, Historical Med      cloNIDine (CATAPRES) 0.2 MG tablet Take 0.2 mg by mouth 2 (two) times daily., Until  Discontinued, Historical Med      dilTIAZem (CARDIZEM) 60 MG tablet Take 1 tablet (60 mg total) by mouth 4 (four) times daily, Starting Thu 06/05/2017, Print      Diltiazem HCl ER Beads (TIAZAC PO) Take by mouth., Until Discontinued, Historical Med      esomeprazole (NEXIUM) 40 MG capsule Take 40 mg by mouth every morning before breakfast., Until Discontinued, Historical Med      ferrous sulfate 325 (65 FE) MG tablet Take 325 mg by mouth every morning with breakfast., Until Discontinued, Historical Med      insulin aspart (NOVOLOG 70/30) (70-30) 100 UNIT/ML injection Inject into the skin 2 (two) times daily before meals., Until Discontinued, Historical Med      insulin detemir (LEVEMIR) 100 UNIT/ML injection Inject into the skin., Until Discontinued, Historical Med      metFORMIN (GLUCOPHAGE) 850 MG tablet Take 850 mg by mouth 2 (two) times daily with meals., Until Discontinued, Historical Med      Rivaroxaban (XARELTO PO) Take by mouth., Until Discontinued, Historical Med      vitamin B-12 (CYANOCOBALAMIN) 500 MCG tablet Take 500 mcg by mouth daily.,  Until Discontinued, Historical Med             PHYSICAL EXAM     Constitutional: Appears stated age  Eyes: Pupils equal, non-icteric  HENT: NC, AT, trachea midline  CV:  Well perfused, normal rate, normal rhythm   Resp: Normal effort, no stridor  GI: Soft, non tender, non distended  MSK: No acute injuries on extremities, left lower chest wall tenderness to palpation that is reproducible  Neuro: A+Ox3, moving extremities spontaneous   Psych: Intact judgement and insight  Skin: Warm, dry ED Triage Vitals   Enc Vitals Group      BP       Pulse       Resp       Temp       Temp src       SpO2       Weight       Height       Head Circumference       Peak Flow       Pain Score       Pain Loc       Pain Edu?       Excl. in GC?           MEDICAL DECISION MAKING     PRIMARY PROBLEM LIST     Acute illness/injury with risk to life or bodily function (based on differential diagnosis  or evaluation) Moderate COVID-19 infection with musculoskeletal chest pain  Chronic Illness Impacting Care of the above problem: Hypertension, Diabetes, and Advanced age Increases complexity of evaluation and Increases the risk of severe disease  Differential Diagnosis: Viral URI versus COVID or flu versus pneumonia versus ACS versus arrhythmia versus heart failure versus PE although she is already anticoagulated making this less likely versus metabolic or electrolyte disarray versus musculoskeletal chest pain  DISCUSSION           88 year old female presenting with nausea, cough, left-sided chest pain and mild abdominal pain.  Cardiopulmonary work-up obtained and shows that she is COVID-positive.  Her troponins are flat.  Doubt ACS.  X-ray without any focal infiltrates.  Suspect her symptoms are from the COVID infection.  I discussed the findings with her and her daughter.  I discussed COVID antiviral treatment which they read.  I discussed close outpatient follow-up with PCP as she is currently clinically stable.  I also discussed strict return precautions for any worsening symptoms and shortness of breath.  They showed good understanding.  Discharged stable condition.    PULSE OX  CARDIAC STUDIES  EMERGENCY IMAGING STUDIES   The following studies and imaging were independently interpreted by me Teniya Filter, MD.    For full study results please see chart.      Monitor Strip Interpreted by Jerl Santos, MD:  NSR, ST Changes: none    EKG Interpreted independently by Shirel Mallis, MD:  NSR, no STEMI, non-specific EKG    Chest Xray interpreted independently by Vicktoria Muckey, MD: Findings/Impression: No displaced rib fracture. No pneumothorax. No acute infiltrate.      CRITICAL CARE/PROCEDURES    Procedures      RADIOLOGY IMAGING STUDIES      XR Chest 2 Views   Final Result      1. New hypoventilation. Otherwise no change is no acute process seen.      Charlene Brooke, MD    02/17/2021 6:23 PM  External Records Reviewed?: Physician Office Records and Inpatient Records  Additional Notes      Prescription medications considered and not given: Antibiotics considered and not given as symptoms felt to be viral in etiology              Vital Signs: Reviewed the patient's vital signs.   Nursing Notes: Reviewed and utilized available nursing notes.  Medical Records Reviewed: Reviewed available past medical records.  Counseling: The emergency provider has spoken with the patient and discussed today's findings, in addition to providing specific details for the plan of care.  Questions are answered and there is agreement with the plan.      MIPS DOCUMENTATION        EMERGENCY DEPT. MEDICATIONS      ED Medication Orders (From admission, onward)      Start Ordered     Status Ordering Provider    02/17/21 1800 02/17/21 1759    Once        Route: Transdermal  Ordered Dose: 1 patch     Discontinued Demontrez Rindfleisch            LABORATORY RESULTS    Ordered and independently interpreted AVAILABLE laboratory tests.   Results       Procedure Component Value Units Date/Time    High Sensitivity Troponin-I with calculated Delta [[id number]]  (Abnormal) Collected: 02/17/21 2049    Specimen: Blood Updated: 02/17/21 2124     hs Troponin-I 22.5 ng/L      hs Troponin-I Delta -0.3 ng/L     COVID-19 (SARS-CoV-2) and Influenza A/B, NAA ([facility])- Admission [[id number]]  (Abnormal) Collected: 02/17/21 1842    Specimen: Culturette from Nasopharyngeal Updated: 02/17/21 1946     Purpose of COVID testing Diagnostic -[facility]     SARS-CoV-2 Specimen Source Nasal Swab     SARS CoV 2 Overall Result Detected     Influenza A Not Detected     Influenza B Not Detected    Narrative:      o Collect and clearly label specimen type:  o PREFERRED-Upper respiratory specimen: One Nasal Swab in  Transport Media.  o Hand deliver to laboratory ASAP  Diagnostic -[facility]    High Sensitivity Troponin-I [[id number]]  (Abnormal) Collected: 02/17/21  1842    Specimen: Blood Updated: 02/17/21 1918     hs Troponin-I 22.8 ng/L     Comprehensive metabolic panel [[id number]]  (Abnormal) Collected: 02/17/21 1842    Specimen: Blood Updated: 02/17/21 1915     Glucose 138 mg/dL      BUN 62.9 mg/dL      Creatinine 1.4 mg/dL      Sodium 528 mEq/L      Potassium 4.3 mEq/L      Chloride 103 mEq/L      CO2 27 mEq/L      Calcium 8.9 mg/dL      Protein, Total 6.5 g/dL      Albumin 3.1 g/dL      AST (SGOT) 34 U/L      ALT 25 U/L      Alkaline Phosphatase 64 U/L      Bilirubin, Total 0.2 mg/dL      Globulin 3.4 g/dL      Albumin/Globulin Ratio 0.9     Anion Gap 10.0    GFR [[id number]] Collected: 02/17/21 1842     Updated: 02/17/21 1915     EGFR 43.1    CBC and differential [[id number]]  (Abnormal) Collected: 02/17/21 1842    Specimen:  Blood Updated: 02/17/21 1857     WBC 6.43 x10 3/uL      Hgb 11.3 g/dL      Hematocrit 16.1 %      Platelets 334 x10 3/uL      RBC 4.28 x10 6/uL      MCV 82.2 fL      MCH 26.4 pg      MCHC 32.1 g/dL      RDW 17 %      MPV 10.0 fL      Instrument Absolute Neutrophil Count 1.89 x10 3/uL      Neutrophils 29.2 %      Lymphocytes Automated 43.9 %      Monocytes 24.3 %      Eosinophils Automated 1.9 %      Basophils Automated 0.5 %      Immature Granulocytes 0.2 %      Nucleated RBC 0.0 /100 WBC      Neutrophils Absolute 1.89 x10 3/uL      Lymphocytes Absolute Automated 2.82 x10 3/uL      Monocytes Absolute Automated 1.56 x10 3/uL      Eosinophils Absolute Automated 0.12 x10 3/uL      Basophils Absolute Automated 0.03 x10 3/uL      Immature Granulocytes Absolute 0.01 x10 3/uL      Absolute NRBC 0.00 x10 3/uL             DIAGNOSIS      Diagnosis:  Final diagnoses:   Musculoskeletal chest pain   COVID-19       Disposition:  ED Disposition       ED Disposition   Discharge    Condition   --    Date/Time   Sat Feb 17, 2021  9:36 PM    Comment   Emily Stevens discharge to home/self care.    Condition at disposition: Stable                 Prescriptions:  Discharge  Medication List as of 02/17/2021  9:37 PM        START taking these medications    Details   lidocaine (LIDODERM) 5 % Place 1 patch onto the skin every 24 hours Remove & Discard patch within 12 hours or as directed by MD, Starting Sat 02/17/2021, E-Rx      molnupiravir 200 mg capsule (emergency use authorization) Take 4 capsules (800 mg) by mouth every 12 (twelve) hours for 5 days, Starting Sat 02/17/2021, Until Thu 02/22/2021, E-Rx           CONTINUE these medications which have NOT CHANGED    Details   acetaminophen (TYLENOL) 325 MG tablet Take 2 tablets (650 mg) by mouth every 6 (six) hours as needed for Pain, Starting Wed 01/17/2021, E-Rx      Cholecalciferol (VITAMIN D) 1000 UNIT tablet Take 1,000 Units by mouth daily., Until Discontinued, Historical Med      cloNIDine (CATAPRES) 0.2 MG tablet Take 0.2 mg by mouth 2 (two) times daily., Until Discontinued, Historical Med      dilTIAZem (CARDIZEM) 60 MG tablet Take 1 tablet (60 mg total) by mouth 4 (four) times daily, Starting Thu 06/05/2017, Print      Diltiazem HCl ER Beads (TIAZAC PO) Take by mouth., Until Discontinued, Historical Med      esomeprazole (NEXIUM) 40 MG capsule Take 40 mg by mouth every morning before breakfast., Until Discontinued, Historical Med      ferrous sulfate 325 (65  FE) MG tablet Take 325 mg by mouth every morning with breakfast., Until Discontinued, Historical Med      insulin aspart (NOVOLOG 70/30) (70-30) 100 UNIT/ML injection Inject into the skin 2 (two) times daily before meals., Until Discontinued, Historical Med      insulin detemir (LEVEMIR) 100 UNIT/ML injection Inject into the skin., Until Discontinued, Historical Med      metFORMIN (GLUCOPHAGE) 850 MG tablet Take 850 mg by mouth 2 (two) times daily with meals., Until Discontinued, Historical Med      Rivaroxaban (XARELTO PO) Take by mouth., Until Discontinued, Historical Med      vitamin B-12 (CYANOCOBALAMIN) 500 MCG tablet Take 500 mcg by mouth daily., Until Discontinued,  Historical Med                 This note was generated by the Epic EMR system/ Dragon speech recognition and may contain inherent errors or omissions not intended by the user. Grammatical errors, random word insertions, deletions and pronoun errors  are occasional consequences of this technology due to software limitations. Not all errors are caught or corrected. If there are questions or concerns about the content of this note or information contained within the body of this dictation they should be addressed directly with the author for clarification.       Ashaad Gaertner, MD  02/20/21 1728

## 2021-02-17 NOTE — Discharge Instructions (Addendum)
COVID-19 Discharge Instructions     Your COVID test resulted positive. See the instructions below regarding COVID.     Please take the medications discussed and follow up in the clinic as recommended.     What is COVID-19?  COVID-19 is the disease caused by a coronavirus called SARS-CoV-2. The virus is very contagious, and can be spread through coughing, sneezing, or even talking.  The virus can also be spread by close personal contact. It is possible to become infected through contact with contaminated surfaces or objects, but the risk is generally considered to be low. The COVID-19 virus can still be detected on certain types of material several days after exposure but is likely infectious for a much shorter time. Because of how the virus enters the body, it is important to limit touching your face, mouth, and nose.  It is also important to wash your hands or use hand sanitizer frequently, and to clean high touch surfaces thoroughly.  Social distancing and isolation are important ways to decrease the spread of the virus.    How will I receive my test results?   You can receive your COVID-19 test result online through MyChart, our secure patient portal.  If you have been tested for COVID-19 and the result is still pending, please be patient, since COVID-19 test results may take several days to be processed.  If you do not already have an account, a MyChart activation code should have been provided during your visit.   You should visit [email or web address] and follow the instructions to create an account, even if you do not have an activation code.  If you're having difficulty finding your COVID-19 test results through MyChart, or if you have additional questions, please contact your primary care provider or the medical team indicated on your After Visit Summary.    Home Care -- Isolation  Stay at home except to get medical care. You should restrict all activities outside your home. Do not go to  work, school, or public areas. Avoid using public transportation, ridesharing, or taxis.  Isolate yourself from other people within your home as much as possible.  Anyone sick should separate themselves from others at home by staying in a specific "sick" bedroom or space and using a different bathroom (if possible).  Elderly family members, persons with chronic illnesses such as diabetes or cancer, or those taking medications that affect their immune system are at particularly high risk.  For anyone in your home who has increased risk, please notify their primary care physician that you may be infected.  You should restrict contact with pets and other animals while you are sick with COVID-19.  The virus that causes COVID-19 can spread from people to animals during close contact.  The risk of pets spreading COVID-19 to people is low.  Wear a mask or cloth face covering when you must go out in public. You can refer to https://www.cdc.gov/coronavirus/2019-ncov/prevent-getting-sick/types-of-masks.html for instructions on how to select an appropriate mask.  Call ahead before seeing your doctor, any other medical provider, or seeking medical care at any healthcare facility.  You should tell them that you have been diagnosed with or are being tested for COVID-19. IF YOU CALL 911, TELL THE DISPATCHER THAT YOU HAVE BEEN DIAGNOSED WITH OR ARE BEING TESTED FOR COVID-19.    Home Care -- Cleaning  Avoid touching your eyes, nose, mouth, or other parts of your face.  Wash your hands often, especially after blowing your nose, coughing, or sneezing; after   going to the bathroom; and before eating or preparing food.  Use soap and water for at least 20 seconds.  Using soap and water is the best option for hand washing if the hands are visibly dirty.  If you can't use soap and water, use an alcohol-based hand sanitizer with at least 60% alcohol, covering all surfaces of your hands and rubbing them together until they feel dry.  Avoid  sharing personal household items. You should not share dishes, drinking glasses, cups, eating utensils, towels, or bedding with other people or pets in your home. After using these items, they should be washed thoroughly with soap and water.  Clean all "high touch" surfaces regularly with a disinfecting spray or wipes according to the directions on the label.  High touch surfaces include counters, tabletops, doorknobs, bathroom fixtures, toilets, phones, keyboards, tablets, and bedside tables. Please pay special attention to cleaning your cell phone throughout the day!  Clean any surfaces that may have blood, stool, or other body fluids on them.    Home Care - Symptom Management  Please rest and avoid strenuous activity.  Fevers can be treated with acetaminophen (Tylenol). You should not exceed the total recommended dose on the medication label.   NSAIDs such as ibuprofen (Motrin, Advil) and naproxen (Naprosyn, Aleve) can be used for pain and fevers unless you have a medical condition for which NSAIDs should be avoided (such as heart disease, kidney disease, high blood pressure, or gastrointestinal bleeding).  You should check with a medical provider if unsure whether you can safely take NSAIDs.  You should avoid NSAIDs if possible if you are taking a steroid such as Dexamethasone as part of your COVID-19 treatment.  Cough medication may have been recommended by your healthcare team.  You should contact a medical provider before using before starting any other over-the-counter cough or cold medications.    Discharging with Home Oxygen  Patients recovering from COVID-19 infection may have low blood oxygen levels and require supplemental oxygen for days to weeks, and sometimes longer, even after being discharged from the hospital.  If needed, home oxygen equipment and supplies will be arranged for you to use after discharge.  As you recover from your infection, the supplemental oxygen that you need will hopefully  decrease and eventually be stopped completely.  You should have close medical follow-up after discharge to assess your oxygen requirements and overall progress recovering from your infection.  If you are being discharged with supplemental oxygen, it is very important that you have a pulse oximeter available to monitor the oxygen levels in your body.  If you do not already have a pulse oximeter, one will be provided at the time of discharge.  The pulse oximeter is a clip-like device which is typically placed on your finger.  It should apply gentle pressure, but not be constricting or too loose.  Nail polish and other colored materials on the finger can interfere with the oxygen measurements.  Poor circulation in the hands or constricted blood vessels from the cold can also cause inaccurate measurements.   You should check your oxygen level (SpO2) periodically while at rest and with your daily activities.  Unless instructed otherwise by your physician, you should use the supplemental oxygen to maintain SpO2 >92% as measured on your pulse oximeter.  You should contact your follow-up provider if you have increasing oxygen requirements at home.  You should return to the hospital or seek other immediate medical attention if unable to maintain SpO2 >   92% (or other specified level) using your home oxygen equipment.    What to Look Out For?  Most people with COVID-19 infection will get better, and many will have only mild symptoms. However, a significant number will develop severe infection and symptoms which require close monitoring and treatment in the hospital.  Even after completing treatment in the hospital, patients may have persistent symptoms and require oxygen at home while recovering.  Please contact your doctor or return to the Emergency Department if you develop the warning signs of worsening infection or other complications such as:   A high fever not controlled by acetaminophen (Tylenol)  Worsening shortness of  breath that limits your usual activities  Worsening chest pain, pressure, or tightness  Blue, white, or gray colored lips  Cold arms, legs, hands, or feet with blue, white, or gray skin  Severe weakness and fatigue  New confusion or sleepiness  Sudden numbness, weakness, or loss of balance  Difficulty speaking, vision problems, or severe headache  Unable to maintain SpO2 >92% on a pulse oximeter  This list is not all inclusive; please consult your medical provider for any other severe symptoms that are concerning to you.    Discontinuing Home Isolation  Patients with confirmed COVID-19 should remain under home isolation precautions until the risk of giving it to others is thought to be very low. Day 0 is your first day of symptoms or a positive viral test. Day 1 is the first full day after your symptoms developed or your test specimen was collected.  If you develop symptoms after testing positive, your 5-day isolation period should start over, and Day 0 should be considered your first day of symptoms.  If you have a positive COVID-19 test or have symptoms, isolate at home for at least 5 days.  In order to take yourself off home isolation, the CDC currently recommends that you meet all of these criteria:    You have had no fever for at least 24 hours (that is one full day of no fever without Acetaminophen or other fever reducing medications)  AND  other symptoms are improving (such as cough and shortness of breath)  AND  at least 5 full days have passed since your symptoms first appeared or your positive test (if asymptomatic)    You should continue to wear a well-fitting mask around others at home and in public for 5 additional days (day 6 through day 10) after the end of your 5-day isolation period. If you are unable to wear a mask when around others, you should continue to isolate for a full 10 days.    People who are severely ill with COVID-19 might need to stay home longer than 10 days and up to 20 days after  symptoms first appeared. Persons who are severely immunocompromised may require testing to determine when they can be around others. Talk to your healthcare provider for more information.    There may be additional state or local isolation recommendations or restrictions in place that you should continue to follow.    If you are not sure about whether you can discontinue your home isolation, please call your primary care doctor for advice.    For further information on quarantine and isolation recommendations, please visit the Center for Disease Control (CDC) website below.     For further information please visit the Center for Disease Control (CDC) website:  CDC Coronavirus (COVID-19) Webpage  https://www.cdc.gov/coronavirus/2019-ncov/index.html  What to Do If You Are Sick    https://www.cdc.gov/coronavirus/2019-ncov/if-you-are-sick/steps-when-sick.html  When Can You Be Around Others After COVID-19 Infection  https://www.cdc.gov/coronavirus/2019-ncov/your-health/quarantine-isolation.html      For COVID-19 Vaccine information, please visit:  [email or web address]      For information on COVID-19 Research Opportunities, please visit:   [email or web address]

## 2021-02-17 NOTE — EDIE (Signed)
COLLECTIVE?NOTIFICATION?02/17/2021 17:17?EMRIE, GAYLE E?MRN: [id number]    [place] [facility]'s patient encounter information:   VWU:?[id number]  Account [id number]  Billing Account [id number]      Criteria Met      5 ED Visits in 12 Months    Security and Safety  No Security Events were found.  ED Care Guidelines  There are currently no ED Care Guidelines for this patient. Please check your facility's medical records system.        Prescription Monitoring Program  000??- Narcotic Use Score  000??- Sedative Use Score  000??- Stimulant Use Score  000??- Overdose Risk Score  - All Scores range from 000-999 with 75% of the population scoring < 200 and on 1% scoring above 650  - The last digit of the narcotic, sedative, and stimulant score indicates the number of active prescriptions of that type  - Higher Use scores correlate with increased prescribers, pharmacies, mg equiv, and overlapping prescriptions  - Higher Overdose Risk Scores correlate with increased risk of unintentional overdose death   Concerning or unexpectedly high scores should prompt a review of the PMP record; this does not constitute checking PMP for prescribing purposes.    E.D. Visit Count (12 mo.)  Facility Visits   [place] [facility] 3   [facility] 2   Total 5   Note: Visits indicate total known visits.     Recent Emergency Department Visit Summary  Date Facility [facility] Type Diagnoses or Chief Complaint    Feb 17, 2021  [place] [facility]  [place]  [place]  Emergency      Pain in chest when coughs      Jan 26, 2021  [place] [facility]  [place]  [place]  Emergency      Arm Pains      Hand Pain      Unspecified fracture of the lower end of left radius, initial encounter for closed fracture      Jan 17, 2021  [place] [facility]  [place]  [place]  Emergency      Fall;Arm pain      Fall      Unspecified fracture of the lower end of left radius, initial encounter for closed fracture      Aug 18, 2020  [facility]  [place]  [place]  Emergency      Hypertensive chronic kidney disease with stage 1 through stage 4 chronic kidney disease, or unspecified chronic kidney disease      Mar 16, 2020  [facility]  [place].  [place]  Emergency      Lumbago with sciatica, left side        Recent Inpatient Visit Summary  No Recent Inpatient Visits were found.  Care Team  No Care Team was found.  Collective Portal  This patient has registered at the [facility] Emergency Department   For more information visit: [email or web address]     PLEASE NOTE:     1.   Any care recommendations and other clinical information are provided as guidelines or for historical purposes only, and providers should exercise their own clinical judgment when providing care.    2.   You may only use this information for purposes of treatment, payment or health care operations activities, and subject to the limitations of applicable [employer].    3.   You should consult directly with the  organization that provided a care guideline or other clinical history with any questions about additional information or accuracy or completeness of information provided.    ? 2023 [employer] - [email or web address]

## 2021-03-06 DIAGNOSIS — S52502A Unspecified fracture of the lower end of left radius, initial encounter for closed fracture: Secondary | ICD-10-CM | POA: Diagnosis not present

## 2021-04-02 DIAGNOSIS — N183 Chronic kidney disease, stage 3 unspecified: Secondary | ICD-10-CM | POA: Diagnosis not present

## 2021-04-02 DIAGNOSIS — D631 Anemia in chronic kidney disease: Secondary | ICD-10-CM | POA: Diagnosis not present

## 2021-04-02 DIAGNOSIS — I10 Essential (primary) hypertension: Secondary | ICD-10-CM | POA: Diagnosis not present

## 2021-04-02 DIAGNOSIS — N281 Cyst of kidney, acquired: Secondary | ICD-10-CM | POA: Diagnosis not present

## 2021-04-04 DIAGNOSIS — S52502A Unspecified fracture of the lower end of left radius, initial encounter for closed fracture: Secondary | ICD-10-CM | POA: Diagnosis not present

## 2021-04-16 DIAGNOSIS — N2581 Secondary hyperparathyroidism of renal origin: Secondary | ICD-10-CM | POA: Diagnosis not present

## 2021-04-16 DIAGNOSIS — I10 Essential (primary) hypertension: Secondary | ICD-10-CM | POA: Diagnosis not present

## 2021-04-16 DIAGNOSIS — N390 Urinary tract infection, site not specified: Secondary | ICD-10-CM | POA: Diagnosis not present

## 2021-04-16 DIAGNOSIS — N183 Chronic kidney disease, stage 3 unspecified: Secondary | ICD-10-CM | POA: Diagnosis not present

## 2021-04-16 DIAGNOSIS — D631 Anemia in chronic kidney disease: Secondary | ICD-10-CM | POA: Diagnosis not present

## 2021-04-17 DIAGNOSIS — H903 Sensorineural hearing loss, bilateral: Secondary | ICD-10-CM | POA: Diagnosis not present

## 2021-04-30 ENCOUNTER — Emergency Department
Admission: EM | Admit: 2021-04-30 | Discharge: 2021-04-30 | Disposition: A | Discharge status: Home / Self Care | Payer: No Typology Code available for payment source | Attending: Emergency Medicine | Admitting: Emergency Medicine

## 2021-04-30 ENCOUNTER — Emergency Department: Payer: No Typology Code available for payment source

## 2021-04-30 DIAGNOSIS — M5134 Other intervertebral disc degeneration, thoracic region: Secondary | ICD-10-CM

## 2021-04-30 DIAGNOSIS — M85812 Other specified disorders of bone density and structure, left shoulder: Secondary | ICD-10-CM

## 2021-04-30 DIAGNOSIS — M19012 Primary osteoarthritis, left shoulder: Secondary | ICD-10-CM

## 2021-04-30 DIAGNOSIS — Z008 Encounter for other general examination: Secondary | ICD-10-CM

## 2021-04-30 DIAGNOSIS — M47816 Spondylosis without myelopathy or radiculopathy, lumbar region: Secondary | ICD-10-CM

## 2021-04-30 DIAGNOSIS — I10 Essential (primary) hypertension: Secondary | ICD-10-CM | POA: Insufficient documentation

## 2021-04-30 DIAGNOSIS — M47817 Spondylosis without myelopathy or radiculopathy, lumbosacral region: Secondary | ICD-10-CM | POA: Diagnosis not present

## 2021-04-30 DIAGNOSIS — J986 Disorders of diaphragm: Secondary | ICD-10-CM | POA: Diagnosis not present

## 2021-04-30 DIAGNOSIS — M4807 Spinal stenosis, lumbosacral region: Secondary | ICD-10-CM | POA: Diagnosis not present

## 2021-04-30 DIAGNOSIS — M4306 Spondylolysis, lumbar region: Secondary | ICD-10-CM | POA: Diagnosis not present

## 2021-04-30 DIAGNOSIS — M5127 Other intervertebral disc displacement, lumbosacral region: Secondary | ICD-10-CM | POA: Diagnosis not present

## 2021-04-30 DIAGNOSIS — M5137 Other intervertebral disc degeneration, lumbosacral region: Secondary | ICD-10-CM | POA: Diagnosis not present

## 2021-04-30 LAB — COMPREHENSIVE METABOLIC PANEL
ALT: 9 U/L (ref 0–55)
AST (SGOT): 16 U/L (ref 5–41)
Albumin/Globulin Ratio: 0.8 — ABNORMAL LOW (ref 0.9–2.2)
Albumin: 3.2 g/dL — ABNORMAL LOW (ref 3.5–5.0)
Alkaline Phosphatase: 76 U/L (ref 37–117)
Anion Gap: 8 (ref 5.0–15.0)
BUN: 28 mg/dL — ABNORMAL HIGH (ref 7.0–21.0)
Bilirubin, Total: 0.2 mg/dL (ref 0.2–1.2)
CO2: 26 mEq/L (ref 17–29)
Calcium: 9.5 mg/dL (ref 7.9–10.2)
Chloride: 104 mEq/L (ref 99–111)
Creatinine: 1.2 mg/dL — ABNORMAL HIGH (ref 0.4–1.0)
Globulin: 3.8 g/dL — ABNORMAL HIGH (ref 2.0–3.6)
Glucose: 155 mg/dL — ABNORMAL HIGH (ref 70–100)
Potassium: 4.4 mEq/L (ref 3.5–5.3)
Protein, Total: 7 g/dL (ref 6.0–8.3)
Sodium: 138 mEq/L (ref 135–145)

## 2021-04-30 LAB — CBC AND DIFFERENTIAL
Absolute NRBC: 0 10*3/uL (ref 0.00–0.00)
Basophils Absolute Automated: 0.07 10*3/uL (ref 0.00–0.08)
Basophils Automated: 0.8 %
Eosinophils Absolute Automated: 0.39 10*3/uL (ref 0.00–0.44)
Eosinophils Automated: 4.7 %
Hematocrit: 34.7 % (ref 34.7–43.7)
Hgb: 11.3 g/dL — ABNORMAL LOW (ref 11.4–14.8)
Immature Granulocytes Absolute: 0.01 10*3/uL (ref 0.00–0.07)
Immature Granulocytes: 0.1 %
Instrument Absolute Neutrophil Count: 2.8 10*3/uL (ref 1.10–6.33)
Lymphocytes Absolute Automated: 3.7 10*3/uL — ABNORMAL HIGH (ref 0.42–3.22)
Lymphocytes Automated: 44.7 %
MCH: 26.7 pg (ref 25.1–33.5)
MCHC: 32.6 g/dL (ref 31.5–35.8)
MCV: 81.8 fL (ref 78.0–96.0)
MPV: 10.8 fL (ref 8.9–12.5)
Monocytes Absolute Automated: 1.31 10*3/uL — ABNORMAL HIGH (ref 0.21–0.85)
Monocytes: 15.8 %
Neutrophils Absolute: 2.8 10*3/uL (ref 1.10–6.33)
Neutrophils: 33.9 %
Nucleated RBC: 0 /100 WBC (ref 0.0–0.0)
Platelets: 392 10*3/uL — ABNORMAL HIGH (ref 142–346)
RBC: 4.24 10*6/uL (ref 3.90–5.10)
RDW: 16 % — ABNORMAL HIGH (ref 11–15)
WBC: 8.28 10*3/uL (ref 3.10–9.50)

## 2021-04-30 LAB — URINALYSIS REFLEX TO MICROSCOPIC EXAM - REFLEX TO CULTURE
Bilirubin, UA: NEGATIVE
Blood, UA: NEGATIVE
Glucose, UA: NEGATIVE
Ketones UA: NEGATIVE
Leukocyte Esterase, UA: NEGATIVE
Nitrite, UA: NEGATIVE
Protein, UR: 100 — AB
Specific Gravity UA: 1.011 (ref 1.001–1.035)
Urine pH: 6 (ref 5.0–8.0)
Urobilinogen, UA: NEGATIVE mg/dL (ref 0.2–2.0)

## 2021-04-30 LAB — HIGH SENSITIVITY TROPONIN-I: hs Troponin-I: 9.4 ng/L

## 2021-04-30 LAB — TSH: TSH: 1.84 u[IU]/mL (ref 0.35–4.94)

## 2021-04-30 LAB — LIPASE: Lipase: 14 U/L (ref 8–78)

## 2021-04-30 LAB — GFR: EGFR: 51.5

## 2021-04-30 MED ORDER — CLONIDINE HCL 0.1 MG PO TABS
0.1000 mg | ORAL_TABLET | Freq: Once | ORAL | Status: AC
Start: 2021-04-30 — End: 2021-04-30
  Administered 2021-04-30: 0.1 mg via ORAL
  Filled 2021-04-30: qty 1

## 2021-04-30 MED ORDER — SODIUM CHLORIDE 0.9 % IV BOLUS
1000.0000 mL | Freq: Once | INTRAVENOUS | Status: AC
Start: 2021-04-30 — End: 2021-04-30
  Administered 2021-04-30: 1000 mL via INTRAVENOUS

## 2021-04-30 MED ORDER — LIDOCAINE 5 % EX PTCH
2.0000 | MEDICATED_PATCH | Freq: Once | CUTANEOUS | Status: DC
Start: 2021-04-30 — End: 2021-05-01
  Administered 2021-04-30: 2 via TRANSDERMAL
  Filled 2021-04-30: qty 2

## 2021-04-30 MED ORDER — ACETAMINOPHEN 500 MG PO TABS
1000.0000 mg | ORAL_TABLET | Freq: Once | ORAL | Status: AC
Start: 2021-04-30 — End: 2021-04-30
  Administered 2021-04-30: 1000 mg via ORAL
  Filled 2021-04-30: qty 2

## 2021-04-30 MED ORDER — LIDOCAINE 5 % EX PTCH
1.0000 | MEDICATED_PATCH | CUTANEOUS | 0 refills | Status: DC
Start: 2021-04-30 — End: 2021-10-22

## 2021-04-30 MED ORDER — INSULIN LISPRO 100 UNIT/ML SOLN (WRAP)
4.0000 [IU] | Freq: Once | Status: DC
Start: 2021-04-30 — End: 2021-05-01

## 2021-04-30 NOTE — ED Provider Notes (Shared)
EMERGENCY DEPARTMENT NOTE     Patient initially seen and examined at   ED PHYSICIAN ASSIGNED       None           ED MIDLEVEL (APP) ASSIGNED       Date/Time Event User Comments    04/30/21 1938 PA/NP Provider Assigned Illa Enlow, Willeen Niece, NP assigned as Nurse Practitioner            HISTORY OF PRESENT ILLNESS   Translator Used : No    Chief Complaint: Back Pain and Shoulder Pain       88 y.o. female with past medical history as below presents w/ left mid back pain and left shoulder pain x 3 wks. Daughter who lives with pt denies any recent falls. Last fall in Dec, 2023. Denies any other s/s.    Independent Historian (other than patient): Family (list in HPI)  Additional History Provided by Independent Historian:  MEDICAL HISTORY     Past Medical History:  Past Medical History:   Diagnosis Date    Angioedema     Diabetes mellitus     Hypertension        Past Surgical History:  History reviewed. No pertinent surgical history.    Social History:  Social History     Socioeconomic History    Marital status: Divorced   Tobacco Use    Smoking status: Never    Smokeless tobacco: Never   Vaping Use    Vaping status: Never Used   Substance and Sexual Activity    Alcohol use: No    Drug use: No       Family History:  History reviewed. No pertinent family history.    Outpatient Medication:  Discharge Medication List as of 04/30/2021 11:36 PM        CONTINUE these medications which have NOT CHANGED    Details   acetaminophen (TYLENOL) 325 MG tablet Take 2 tablets (650 mg) by mouth every 6 (six) hours as needed for Pain, Starting Wed 01/17/2021, E-Rx      Cholecalciferol (VITAMIN D) 1000 UNIT tablet Take 1,000 Units by mouth daily., Until Discontinued, Historical Med      cloNIDine (CATAPRES) 0.2 MG tablet Take 0.2 mg by mouth 2 (two) times daily., Until Discontinued, Historical Med      dilTIAZem (CARDIZEM) 60 MG tablet Take 1 tablet (60 mg total) by mouth 4 (four) times daily, Starting Thu  06/05/2017, Print      Diltiazem HCl ER Beads (TIAZAC PO) Take by mouth., Until Discontinued, Historical Med      esomeprazole (NEXIUM) 40 MG capsule Take 40 mg by mouth every morning before breakfast., Until Discontinued, Historical Med      ferrous sulfate 325 (65 FE) MG tablet Take 325 mg by mouth every morning with breakfast., Until Discontinued, Historical Med      insulin aspart (NOVOLOG 70/30) (70-30) 100 UNIT/ML injection Inject into the skin 2 (two) times daily before meals., Until Discontinued, Historical Med      insulin detemir (LEVEMIR) 100 UNIT/ML injection Inject into the skin., Until Discontinued, Historical Med      !! lidocaine (LIDODERM) 5 % Place 1 patch onto the skin every 24 hours Remove & Discard patch within 12 hours or as directed by MD, Starting Sat 02/17/2021, E-Rx      metFORMIN (GLUCOPHAGE) 850 MG tablet Take 850 mg by mouth 2 (two) times daily with meals., Until Discontinued, Historical Med  Rivaroxaban (XARELTO PO) Take by mouth., Until Discontinued, Historical Med      vitamin B-12 (CYANOCOBALAMIN) 500 MCG tablet Take 500 mcg by mouth daily., Until Discontinued, Historical Med       !! - Potential duplicate medications found. Please discuss with provider.            REVIEW OF SYSTEMS   Review of Systems See History of Present Illness  PHYSICAL EXAM     ED Triage Vitals [04/30/21 1905]   Enc Vitals Group      BP 176/82      Heart Rate 69      Resp Rate 20      Temp 98 F (36.7 C)      Temp Source Temporal      SpO2 100 %      Weight 96.6 kg      Height 1.549 m      Head Circumference       Peak Flow       Pain Score 5      Pain Loc       Pain Edu?       Excl. in GC?      Physical Exam  Vitals and nursing note reviewed.   Constitutional:       Appearance: She is morbidly obese.   HENT:      Head: Normocephalic and atraumatic.      Right Ear: Tympanic membrane and ear canal normal. Decreased hearing noted.      Left Ear: Tympanic membrane and ear canal normal. Decreased hearing noted.       Ears:      Comments: HOH     Nose: Nose normal.   Eyes:      Extraocular Movements: Extraocular movements intact.      Conjunctiva/sclera: Conjunctivae normal.      Pupils: Pupils are equal, round, and reactive to light.   Cardiovascular:      Rate and Rhythm: Normal rate and regular rhythm.      Heart sounds: Normal heart sounds.   Pulmonary:      Effort: Pulmonary effort is normal.      Breath sounds: Normal breath sounds.   Abdominal:      General: There is no distension.      Palpations: Abdomen is soft.      Tenderness: There is no abdominal tenderness. There is no guarding.   Musculoskeletal:      Left shoulder: Tenderness present. No swelling. Normal pulse.      Cervical back: Normal range of motion and neck supple.      Thoracic back: Swelling, spasms and tenderness present.      Lumbar back: Spasms and tenderness present. Negative right straight leg raise test and negative left straight leg raise test.      Right lower leg: No edema.      Left lower leg: No edema.   Skin:     General: Skin is warm and dry.      Capillary Refill: Capillary refill takes less than 2 seconds.   Neurological:      General: No focal deficit present.      Mental Status: She is alert and oriented to person, place, and time.   Psychiatric:         Behavior: Behavior normal.          MEDICAL DECISION MAKING     PRIMARY PROBLEM LIST      Chronic Illness with Exacerbation/Progression DIAGNOSIS:  Left shoulder OA, Osteopenia, Lumbar spondylosis, DDD thoracic spine  {Chronic Illness Impacting Care of the above problem:59030} {Explain (Optional):59078}  {Differential Diagnosis:59053}  DISCUSSION          {If patient is being hospitalized is severe sepsis or septic shock suspected?:59467}      {Was management discussed with a consultant?:59037}  {Was the decision around the need for surgery discussed with consultant:59056::"N/A"}  External Records Reviewed?: Physician Office Records and Inpatient Records      Vital Signs: Reviewed the  patient's vital signs.   Nursing Notes: Reviewed and utilized available nursing notes.  Medical Records Reviewed: Reviewed available past medical records.  Counseling: The emergency provider has spoken with the patient and discussed today's findings, in addition to providing specific details for the plan of care.  Questions are answered and there is agreement with the plan.        CARDIAC STUDIES    The following cardiac studies were independently interpreted by me the Emergency Medicine Provider.  For full cardiac study results please see chart.    Monitor Strip interpreted by me (ED provider)  Rate: 60-100  Rhythm: Normal Sinus Rhythm  ST segments: No acute changes      EMERGENCY IMAGING STUDIES    The following imagine studies were independently interpreted by me (emergency medicine provider):    Chest Xray Interpreted by me (ED provider) SIDE : N/A  Comparison: Yes.  No acute changes.  DATE:02/17/21  RESULT: No infiltrate. No pneumothorax. No large hemothorax. No CHF.  IMPRESSION: Other (describe) No acute abnormality.Mild Cardiomegaly.    CT Thoracic Spine Interpreted by me (ED Provider)  Comparison: None available  RESULT: No fracture  IMPRESSION: No acute abnormality  RADIOLOGY IMAGING STUDIES      Shoulder Left 2+ Views   Final Result      No radiographically evident fracture.      Laurena Slimmer, MD   04/30/2021 9:47 PM      CT T- Spine without Contrast   Final Result         No evidence of acute thoracic spine fracture or malalignment.      Carleene Overlie, MD   04/30/2021 9:51 PM      CT L- Spine without Contrast   Final Result    Pronounced multilevel degenerative disc disease and   spondylosis resulting in multilevel central canal and foraminal   narrowing. No acute fracture      Laurena Slimmer, MD   04/30/2021 9:46 PM      XR Chest  AP Portable   Final Result      1.Mild cardiomegaly.      Ewing Schlein MD, MD   04/30/2021 7:58 PM          EMERGENCY DEPT. MEDICATIONS      ED Medication Orders (From admission,  onward)      Start Ordered     Status Ordering Provider    04/30/21 2210 04/30/21 2209    Once in ED        Route: Subcutaneous  Ordered Dose: 4 Units     Discontinued Tiaja Hagan A    04/30/21 2210 04/30/21 2209  cloNIDine (CATAPRES) tablet 0.1 mg  Once        Route: Oral  Ordered Dose: 0.1 mg     Last MAR action: Given Jaedin Trumbo A    04/30/21 2100 04/30/21 2059  sodium chloride 0.9 % bolus 1,000 mL  Once  Route: Intravenous  Ordered Dose: 1,000 mL     Last MAR action: Stopped Jannatul Wojdyla A    04/30/21 1950 04/30/21 1949  acetaminophen (TYLENOL) tablet 1,000 mg  Once        Route: Oral  Ordered Dose: 1,000 mg     Last MAR action: Given Fayrene Towner A    04/30/21 1950 04/30/21 1949    Once in ED        Route: Transdermal  Ordered Dose: 2 patch     Discontinued Deztinee Lohmeyer A            LABORATORY RESULTS    Ordered and independently interpreted AVAILABLE laboratory tests.   Results       Procedure Component Value Units Date/Time    Urinalysis Reflex to Microscopic Exam- Reflex to Culture [[id number]]  (Abnormal) Collected: 04/30/21 2218     Updated: 04/30/21 2255     Urine Type Urine, Clean Ca     Color, UA Straw     Clarity, UA Clear     Specific Gravity UA 1.011     Urine pH 6.0     Leukocyte Esterase, UA Negative     Nitrite, UA Negative     Protein, UR 100     Glucose, UA Negative     Ketones UA Negative     Urobilinogen, UA Negative mg/dL      Bilirubin, UA Negative     Blood, UA Negative     RBC, UA 0 - 2 /hpf      WBC, UA 0 - 5 /hpf      Squamous Epithelial Cells, Urine 0 - 5 /hpf     Narrative:      [place]cheduled by [id number] at 04/30/2021 19:42 Reason: Patient unable to   provide specimen.    TSH [[id number]] Collected: 04/30/21 1941    Specimen: Blood Updated: 04/30/21 2106     TSH 1.84 uIU/mL     High Sensitivity Troponin-I [[id number]] Collected: 04/30/21 1941    Specimen: Blood Updated: 04/30/21 2053     hs Troponin-I 9.4 ng/L     Lipase [[id number]]  Collected: 04/30/21 1941    Specimen: Blood Updated: 04/30/21 2050     Lipase 14 U/L     GFR [[id number]] Collected: 04/30/21 1941     Updated: 04/30/21 2050     EGFR 51.5    Comprehensive metabolic panel [[id number]]  (Abnormal) Collected: 04/30/21 1941    Specimen: Blood Updated: 04/30/21 2050     Glucose 155 mg/dL      BUN 42.5 mg/dL      Creatinine 1.2 mg/dL      Sodium 956 mEq/L      Potassium 4.4 mEq/L      Chloride 104 mEq/L      CO2 26 mEq/L      Calcium 9.5 mg/dL      Protein, Total 7.0 g/dL      Albumin 3.2 g/dL      AST (SGOT) 16 U/L      ALT 9 U/L      Alkaline Phosphatase 76 U/L      Bilirubin, Total 0.2 mg/dL      Globulin 3.8 g/dL      Albumin/Globulin Ratio 0.8     Anion Gap 8.0    CBC and differential [[id number]]  (Abnormal) Collected: 04/30/21 1941    Specimen: Blood Updated: 04/30/21 2021     WBC 8.28 x10 3/uL      Hgb 11.3 g/dL  Hematocrit 34.7 %      Platelets 392 x10 3/uL      RBC 4.24 x10 6/uL      MCV 81.8 fL      MCH 26.7 pg      MCHC 32.6 g/dL      RDW 16 %      MPV 10.8 fL      Instrument Absolute Neutrophil Count 2.80 x10 3/uL      Neutrophils 33.9 %      Lymphocytes Automated 44.7 %      Monocytes 15.8 %      Eosinophils Automated 4.7 %      Basophils Automated 0.8 %      Immature Granulocytes 0.1 %      Nucleated RBC 0.0 /100 WBC      Neutrophils Absolute 2.80 x10 3/uL      Lymphocytes Absolute Automated 3.70 x10 3/uL      Monocytes Absolute Automated 1.31 x10 3/uL      Eosinophils Absolute Automated 0.39 x10 3/uL      Basophils Absolute Automated 0.07 x10 3/uL      Immature Granulocytes Absolute 0.01 x10 3/uL      Absolute NRBC 0.00 x10 3/uL               CRITICAL CARE/PROCEDURES    Procedures    DIAGNOSIS      Diagnosis:  Final diagnoses:   Osteoarthritis of left shoulder, unspecified osteoarthritis type   Osteopenia of left shoulder   Degenerative disc disease, thoracic   Lumbar spondylosis   Benign essential hypertension       Disposition:  ED Disposition       ED Disposition    Discharge    Condition   --    Date/Time   Mon Apr 30, 2021 11:14 PM    Comment   [place] discharge to home/self care.    Condition at disposition: Stable                 Prescriptions:  Discharge Medication List as of 04/30/2021 11:36 PM        START taking these medications    Details   !! lidocaine (LIDODERM) 5 % Place 1 patch onto the skin every 24 hours Remove & Discard patch within 12 hours or as directed by MD, Starting Mon 04/30/2021, E-Rx       !! - Potential duplicate medications found. Please discuss with provider.        CONTINUE these medications which have NOT CHANGED    Details   acetaminophen (TYLENOL) 325 MG tablet Take 2 tablets (650 mg) by mouth every 6 (six) hours as needed for Pain, Starting Wed 01/17/2021, E-Rx      Cholecalciferol (VITAMIN D) 1000 UNIT tablet Take 1,000 Units by mouth daily., Until Discontinued, Historical Med      cloNIDine (CATAPRES) 0.2 MG tablet Take 0.2 mg by mouth 2 (two) times daily., Until Discontinued, Historical Med      dilTIAZem (CARDIZEM) 60 MG tablet Take 1 tablet (60 mg total) by mouth 4 (four) times daily, Starting Thu 06/05/2017, Print      Diltiazem HCl ER Beads (TIAZAC PO) Take by mouth., Until Discontinued, Historical Med      esomeprazole (NEXIUM) 40 MG capsule Take 40 mg by mouth every morning before breakfast., Until Discontinued, Historical Med      ferrous sulfate 325 (65 FE) MG tablet Take 325 mg by mouth every morning with breakfast., Until  Discontinued, Historical Med      insulin aspart (NOVOLOG 70/30) (70-30) 100 UNIT/ML injection Inject into the skin 2 (two) times daily before meals., Until Discontinued, Historical Med      insulin detemir (LEVEMIR) 100 UNIT/ML injection Inject into the skin., Until Discontinued, Historical Med      !! lidocaine (LIDODERM) 5 % Place 1 patch onto the skin every 24 hours Remove & Discard patch within 12 hours or as directed by MD, Starting Sat 02/17/2021, E-Rx      metFORMIN (GLUCOPHAGE) 850 MG tablet Take 850  mg by mouth 2 (two) times daily with meals., Until Discontinued, Historical Med      Rivaroxaban (XARELTO PO) Take by mouth., Until Discontinued, Historical Med      vitamin B-12 (CYANOCOBALAMIN) 500 MCG tablet Take 500 mcg by mouth daily., Until Discontinued, Historical Med       !! - Potential duplicate medications found. Please discuss with provider.              This note was generated by the Epic EMR system/ Dragon speech recognition and may contain inherent errors or omissions not intended by the user. Grammatical errors, random word insertions, deletions and pronoun errors  are occasional consequences of this technology due to software limitations. Not all errors are caught or corrected. If there are questions or concerns about the content of this note or information contained within the body of this dictation they should be addressed directly with the author for clarification.

## 2021-04-30 NOTE — EDIE (Signed)
COLLECTIVE?NOTIFICATION?04/30/2021 18:48?CLARRISSA, SHIMKUS E?MRN: [id number]    [place] [facility]'s patient encounter information:   VWU:?[id number]  Account [id number]  Billing Account [id number]      Criteria Met      5 ED Visits in 12 Months    Security and Safety  No Security Events were found.  ED Care Guidelines  There are currently no ED Care Guidelines for this patient. Please check your facility's medical records system.        Prescription Monitoring Program  000??- Narcotic Use Score  000??- Sedative Use Score  000??- Stimulant Use Score  000??- Overdose Risk Score  - All Scores range from 000-999 with 75% of the population scoring < 200 and on 1% scoring above 650  - The last digit of the narcotic, sedative, and stimulant score indicates the number of active prescriptions of that type  - Higher Use scores correlate with increased prescribers, pharmacies, mg equiv, and overlapping prescriptions  - Higher Overdose Risk Scores correlate with increased risk of unintentional overdose death   Concerning or unexpectedly high scores should prompt a review of the PMP record; this does not constitute checking PMP for prescribing purposes.    E.D. Visit Count (12 mo.)  Facility Visits   [place] [facility] 4   [facility] 1   Total 5   Note: Visits indicate total known visits.     Recent Emergency Department Visit Summary  Date Facility [facility] Type Diagnoses or Chief Complaint    Apr 30, 2021  [place] [facility]  [place]  [place]  Emergency      Back Pain; Shoulder Pain      Feb 17, 2021  [place] [facility]  [place]  [place]  Emergency      Pain in chest when coughs      Nausea      Chest Pain      Emesis      Abdominal Pain      Other chest pain      COVID-19      Jan 26, 2021  [place] [facility]  [place]  [place]  Emergency      Arm Pains      Hand Pain      Unspecified fracture of the lower end of left radius, initial encounter for closed fracture      Jan 17, 2021  [place] [facility]  [place]  [place]  Emergency      Fall;Arm pain      Fall      Unspecified fracture of the lower end of left radius, initial encounter for closed fracture      Aug 18, 2020  [facility]  [place].  [place]  Emergency      Hypertensive chronic kidney disease with stage 1 through stage 4 chronic kidney disease, or unspecified chronic kidney disease        Recent Inpatient Visit Summary  No Recent Inpatient Visits were found.  Care Team  No Care Team was found.  Collective Portal  This patient has registered at the  [facility] Emergency Department   For more information visit: [email or web address]     PLEASE NOTE:     1.   Any care recommendations and other clinical information are provided as guidelines or for historical purposes only, and providers should exercise their own clinical judgment when providing care.  2.   You may only use this information for purposes of treatment, payment or health care operations activities, and subject to the limitations of applicable [employer] Policies.    3.   You should consult directly with the organization that provided a care guideline or other clinical history with any questions about additional information or accuracy or completeness of information provided.    ? 2023 [employer] - [email or web address]

## 2021-05-01 DIAGNOSIS — R600 Localized edema: Secondary | ICD-10-CM | POA: Diagnosis not present

## 2021-05-02 LAB — ECG 12-LEAD
Atrial Rate: 73 {beats}/min
P Axis: 5 degrees
P-R Interval: 252 ms
Q-T Interval: 422 ms
QRS Duration: 130 ms
QTC Calculation (Bezet): 464 ms
R Axis: -65 degrees
T Axis: 93 degrees
Ventricular Rate: 73 {beats}/min

## 2021-05-30 DIAGNOSIS — H6122 Impacted cerumen, left ear: Secondary | ICD-10-CM | POA: Diagnosis not present

## 2021-05-30 DIAGNOSIS — I10 Essential (primary) hypertension: Secondary | ICD-10-CM | POA: Diagnosis not present

## 2021-06-06 DIAGNOSIS — R0600 Dyspnea, unspecified: Secondary | ICD-10-CM | POA: Diagnosis not present

## 2021-06-06 DIAGNOSIS — I480 Paroxysmal atrial fibrillation: Secondary | ICD-10-CM | POA: Diagnosis not present

## 2021-06-06 DIAGNOSIS — I1310 Hypertensive heart and chronic kidney disease without heart failure, with stage 1 through stage 4 chronic kidney disease, or unspecified chronic kidney disease: Secondary | ICD-10-CM | POA: Diagnosis not present

## 2021-06-06 DIAGNOSIS — E785 Hyperlipidemia, unspecified: Secondary | ICD-10-CM | POA: Diagnosis not present

## 2021-06-08 DIAGNOSIS — Z79899 Other long term (current) drug therapy: Secondary | ICD-10-CM | POA: Diagnosis not present

## 2021-06-08 DIAGNOSIS — E7800 Pure hypercholesterolemia, unspecified: Secondary | ICD-10-CM | POA: Diagnosis not present

## 2021-06-08 DIAGNOSIS — E119 Type 2 diabetes mellitus without complications: Secondary | ICD-10-CM | POA: Diagnosis not present

## 2021-06-13 DIAGNOSIS — R0602 Shortness of breath: Secondary | ICD-10-CM | POA: Diagnosis not present

## 2021-06-13 DIAGNOSIS — R9431 Abnormal electrocardiogram [ECG] [EKG]: Secondary | ICD-10-CM | POA: Diagnosis not present

## 2021-06-25 ENCOUNTER — Emergency Department: Payer: No Typology Code available for payment source

## 2021-06-25 ENCOUNTER — Emergency Department
Admission: EM | Admit: 2021-06-25 | Discharge: 2021-06-25 | Disposition: A | Discharge status: Home / Self Care | Payer: No Typology Code available for payment source | Attending: Emergency Medicine | Admitting: Emergency Medicine

## 2021-06-25 DIAGNOSIS — Z794 Long term (current) use of insulin: Secondary | ICD-10-CM | POA: Insufficient documentation

## 2021-06-25 DIAGNOSIS — H9202 Otalgia, left ear: Secondary | ICD-10-CM | POA: Insufficient documentation

## 2021-06-25 DIAGNOSIS — R519 Headache, unspecified: Secondary | ICD-10-CM | POA: Insufficient documentation

## 2021-06-25 DIAGNOSIS — R9431 Abnormal electrocardiogram [ECG] [EKG]: Secondary | ICD-10-CM | POA: Insufficient documentation

## 2021-06-25 DIAGNOSIS — I10 Essential (primary) hypertension: Secondary | ICD-10-CM | POA: Insufficient documentation

## 2021-06-25 DIAGNOSIS — Z7984 Long term (current) use of oral hypoglycemic drugs: Secondary | ICD-10-CM | POA: Insufficient documentation

## 2021-06-25 DIAGNOSIS — R079 Chest pain, unspecified: Secondary | ICD-10-CM

## 2021-06-25 DIAGNOSIS — I440 Atrioventricular block, first degree: Secondary | ICD-10-CM | POA: Insufficient documentation

## 2021-06-25 DIAGNOSIS — E119 Type 2 diabetes mellitus without complications: Secondary | ICD-10-CM | POA: Insufficient documentation

## 2021-06-25 DIAGNOSIS — I493 Ventricular premature depolarization: Secondary | ICD-10-CM | POA: Insufficient documentation

## 2021-06-25 DIAGNOSIS — J986 Disorders of diaphragm: Secondary | ICD-10-CM | POA: Diagnosis not present

## 2021-06-25 DIAGNOSIS — H9209 Otalgia, unspecified ear: Secondary | ICD-10-CM | POA: Diagnosis not present

## 2021-06-25 LAB — CBC AND DIFFERENTIAL
Absolute NRBC: 0 10*3/uL (ref 0.00–0.00)
Basophils Absolute Automated: 0.08 10*3/uL (ref 0.00–0.08)
Basophils Automated: 0.9 %
Eosinophils Absolute Automated: 0.39 10*3/uL (ref 0.00–0.44)
Eosinophils Automated: 4.4 %
Hematocrit: 34.7 % (ref 34.7–43.7)
Hgb: 11.1 g/dL — ABNORMAL LOW (ref 11.4–14.8)
Immature Granulocytes Absolute: 0.02 10*3/uL (ref 0.00–0.07)
Immature Granulocytes: 0.2 %
Instrument Absolute Neutrophil Count: 3.52 10*3/uL (ref 1.10–6.33)
Lymphocytes Absolute Automated: 3.79 10*3/uL — ABNORMAL HIGH (ref 0.42–3.22)
Lymphocytes Automated: 42.6 %
MCH: 26.2 pg (ref 25.1–33.5)
MCHC: 32 g/dL (ref 31.5–35.8)
MCV: 82 fL (ref 78.0–96.0)
MPV: 10.4 fL (ref 8.9–12.5)
Monocytes Absolute Automated: 1.1 10*3/uL — ABNORMAL HIGH (ref 0.21–0.85)
Monocytes: 12.4 %
Neutrophils Absolute: 3.52 10*3/uL (ref 1.10–6.33)
Neutrophils: 39.5 %
Nucleated RBC: 0 /100 WBC (ref 0.0–0.0)
Platelets: 408 10*3/uL — ABNORMAL HIGH (ref 142–346)
RBC: 4.23 10*6/uL (ref 3.90–5.10)
RDW: 16 % — ABNORMAL HIGH (ref 11–15)
WBC: 8.9 10*3/uL (ref 3.10–9.50)

## 2021-06-25 LAB — COMPREHENSIVE METABOLIC PANEL
ALT: 12 U/L (ref 0–55)
AST (SGOT): 18 U/L (ref 5–41)
Albumin/Globulin Ratio: 0.9 (ref 0.9–2.2)
Albumin: 3.4 g/dL — ABNORMAL LOW (ref 3.5–5.0)
Alkaline Phosphatase: 79 U/L (ref 37–117)
Anion Gap: 9 (ref 5.0–15.0)
BUN: 32 mg/dL — ABNORMAL HIGH (ref 7.0–21.0)
Bilirubin, Total: 0.3 mg/dL (ref 0.2–1.2)
CO2: 25 mEq/L (ref 17–29)
Calcium: 9.2 mg/dL (ref 7.9–10.2)
Chloride: 106 mEq/L (ref 99–111)
Creatinine: 1.2 mg/dL — ABNORMAL HIGH (ref 0.4–1.0)
Globulin: 4 g/dL — ABNORMAL HIGH (ref 2.0–3.6)
Glucose: 84 mg/dL (ref 70–100)
Potassium: 4.7 mEq/L (ref 3.5–5.3)
Protein, Total: 7.4 g/dL (ref 6.0–8.3)
Sodium: 140 mEq/L (ref 135–145)
eGFR: 43.7 mL/min/{1.73_m2} — AB (ref >=60)

## 2021-06-25 LAB — PT AND APTT
PT INR: 1.2 — ABNORMAL HIGH (ref 0.9–1.1)
PT: 14.4 s — ABNORMAL HIGH (ref 10.1–12.9)
PTT: 35 s (ref 27–39)

## 2021-06-25 LAB — HIGH SENSITIVITY TROPONIN-I: hs Troponin-I: 13.5 ng/L

## 2021-06-25 MED ORDER — ACETAMINOPHEN ER 650 MG PO TBCR
650.0000 mg | EXTENDED_RELEASE_TABLET | Freq: Four times a day (QID) | ORAL | 0 refills | Status: AC | PRN
Start: 2021-06-25 — End: ?

## 2021-06-25 NOTE — EDIE (Signed)
[facility]?NOTIFICATION?06/25/2021 12:15?EBANY, BOWERMASTER E?MRN: [id number]    [place] [facility]'s patient encounter information:   VWU:?[id number]  Account [id number]  Billing Account [id number]      Criteria Met      5 ED Visits in 12 Months    Security and Safety  No Security Events were found.  ED Care Guidelines  There are currently no ED Care Guidelines for this patient. Please check your facility's medical records system.        Prescription Monitoring Program  000??- Narcotic Use Score  000??- Sedative Use Score  000??- Stimulant Use Score  000??- Overdose Risk Score  - All Scores range from 000-999 with 75% of the population scoring < 200 and on 1% scoring above 650  - The last digit of the narcotic, sedative, and stimulant score indicates the number of active prescriptions of that type  - Higher Use scores correlate with increased prescribers, pharmacies, mg equiv, and overlapping prescriptions  - Higher Overdose Risk Scores correlate with increased risk of unintentional overdose death   Concerning or unexpectedly high scores should prompt a review of the PMP record; this does not constitute checking PMP for prescribing purposes.    E.D. Visit Count (12 mo.)  Facility Visits   [place] [facility] 5   [facility] 1   Total 6   Note: Visits indicate total known visits.     Recent Emergency Department Visit Summary  Date Facility [facility] Type Diagnoses or Chief Complaint    Jun 25, 2021  [place] [facility]  [place]  [place]  Emergency      Headache; Ear Pain      Apr 30, 2021  [place] [facility]  [place]  [place]  Emergency      Back Pain; Shoulder Pain      Back Pain      Shoulder Pain      Other specified disorders of bone density and structure, left shoulder      Primary osteoarthritis, left shoulder      Other intervertebral disc degeneration, thoracic region      Spondylosis without myelopathy or radiculopathy, lumbar region      Essential  (primary) hypertension      Feb 17, 2021  [place] [facility]  [place]  [place]  Emergency      Pain in chest when coughs      Nausea      Chest Pain      Emesis      Abdominal Pain      Other chest pain      COVID-19      Jan 26, 2021  [place] [facility]  [place]  [place]  Emergency      Arm Pains      Hand Pain      Unspecified fracture of the lower end of left radius, initial encounter for closed fracture      Jan 17, 2021  [place] [facility]  [place]  [place]  Emergency      Fall;Arm pain      Fall      Unspecified fracture of the lower end of left radius, initial encounter for closed fracture      Aug 18, 2020  [facility]  [place].  [place]  Emergency      Hypertensive chronic kidney disease with stage 1 through stage 4 chronic kidney disease, or unspecified  chronic kidney disease        Recent Inpatient Visit Summary  No Recent Inpatient Visits were found.  Care Team  No Care Team was found.  [facility]  This patient has registered at the [facility] Emergency Department   For more information visit: [email or web address]     PLEASE NOTE:     1.   Any care recommendations and other clinical information are provided as guidelines or for historical purposes only, and providers should exercise their own clinical judgment when providing care.    2.   You may only use this information for purposes of treatment, payment or health care operations activities, and subject to the limitations of applicable [employer] Policies.    3.   You should consult directly with the organization that provided a care guideline or other clinical history with any questions about additional information or accuracy or completeness of information provided.    ? 2023 [employer] - [email or web address]

## 2021-06-25 NOTE — Discharge Instructions (Addendum)
Your CAT scan here today showed no acute abnormality.  Follow-up with your primary care doctor for intermittent chronic headaches, referral to neurology as needed.  Also for your chronic left sided ear pain and hearing loss you should be seen by ENT and audiology.  Tylenol has been helping your pain you can continue to take this with food 650 mg p.o. as needed, however you should not take this long-term.    Also your blood pressure is elevated you need to monitor this at home follow-up your primary doctor for adjustment of your medication as needed.  Any chest pain, shortness of breath, blurred vision, dizziness decreased urination, weakness return emergency room otherwise follow-up as discussed.    Headache     You have been treated for a headache.     Headaches are very common. Most of the time they are benign (not harmful). Some headaches can be very serious. Your headache appears to be benign. The doctor feels it is OK for you to go home.     If you continue to have headaches, or if this headache does not go away over the next few days, you should be evaluated by your regular doctor or a neurologist. Keeping a "headache diary" may help your doctor learn the cause of your headaches.     When you get a headache, write down:  What happens before your headache starts - Where you were, what you were doing, if you ate anything, and so on.  Where your pain is.  What kind-of pain you have - Sharp, aching, throbbing, burning.   What helps your headache get better.     Take your headache medication as directed. This is very important if your doctor has placed you on a daily medication to prevent headaches.     Return here or go to the nearest Emergency Department immediately if:  Your headache gets worse.  You have a severe headache that starts suddenly.  Your head pain is different from your normal headache.  You have a fever (temperature higher than 100.56F / 38C), especially with a stiff neck.  You feel numbness,  tingling, or weakness in your arms or legs.  You pass out.  You have problems with your vision.  You vomit (throw up) and have trouble taking medication or keeping it down.     If you can't follow up with your doctor, or if at any time you feel you need to be rechecked or seen again, come back here or go to the nearest emergency department.

## 2021-06-25 NOTE — ED Provider Notes (Signed)
EMERGENCY DEPARTMENT NOTE     Patient initially seen and examined at   ED PHYSICIAN ASSIGNED       None           ED MIDLEVEL (APP) ASSIGNED       Date/Time Event User Comments    06/25/21 1223 PA/NP Provider Assigned Elliannah Wayment, Eulis Foster, PA assigned as Physician Assistant            HISTORY OF PRESENT ILLNESS   Historian:Patient  Translator Used: No    Chief Complaint: Headache and Otalgia     Mechanism of Injury:       88 y.o. female with a hx of DM, HTN, multiple drug allergies, falls, comes in today for c/o intermittent headaches on left side and around the left ear x 3 weeks.  Patient reports some intermittent ringing in the ears as well.  Patient denies any fall or trauma.       Pt has been taking Tylenol for headache, which resolves pain, but pain persists daily.  Currently patient reports pain is very mild, 2/10, left-sided head around the ear.    Patient denies any ear drainage, fevers, chills, nasal congestion, cough, sore throat, postnasal drainage, nosebleeds.    Patient otherwise denies any blurred vision, confusion, nausea, vomiting, weakness, dizziness lightheadedness, weakness one-sided body versus other, difficulty walking, difficulty speaking, chest pain, shortness of breath or any other associated complaints.    Patient has had a normal appetite.    Social history patient lives with her daughter, denies smoking, alcohol use or drug use.    Location of symptoms: head and left ear  Onset of symptoms: 3 weeks ago  What was patient doing when symptoms started (Context): see above  Severity: moderate  Timing: pesistent  Activities that worsen symptoms: nothing  Activities that improve symptoms: tylenol  Quality: achying  Radiation of symptoms: no  Associated signs and Symptoms: see above  Are symptoms worsening? yes  MEDICAL HISTORY     Past Medical History:  Past Medical History:   Diagnosis Date    Angioedema     Diabetes mellitus     Hypertension        Past Surgical  History:  Past Surgical History:   Procedure Laterality Date    HERNIA REPAIR      HYSTERECTOMY         Social History:  Social History     Socioeconomic History    Marital status: Divorced   Tobacco Use    Smoking status: Never    Smokeless tobacco: Never   Vaping Use    Vaping status: Never Used   Substance and Sexual Activity    Alcohol use: No    Drug use: No       Family History:  History reviewed. No pertinent family history.    Outpatient Medication:  Discharge Medication List as of 06/25/2021  2:27 PM        CONTINUE these medications which have NOT CHANGED    Details   Cholecalciferol (VITAMIN D) 1000 UNIT tablet Take 1,000 Units by mouth daily., Until Discontinued, Historical Med      cloNIDine (CATAPRES) 0.2 MG tablet Take 0.2 mg by mouth 2 (two) times daily., Until Discontinued, Historical Med      dilTIAZem (CARDIZEM) 60 MG tablet Take 1 tablet (60 mg total) by mouth 4 (four) times daily, Starting Thu 06/05/2017, Print      Diltiazem HCl ER Beads (TIAZAC PO) Take by  mouth., Until Discontinued, Historical Med      esomeprazole (NEXIUM) 40 MG capsule Take 40 mg by mouth every morning before breakfast., Until Discontinued, Historical Med      ferrous sulfate 325 (65 FE) MG tablet Take 325 mg by mouth every morning with breakfast., Until Discontinued, Historical Med      insulin aspart (NOVOLOG 70/30) (70-30) 100 UNIT/ML injection Inject into the skin 2 (two) times daily before meals., Until Discontinued, Historical Med      insulin detemir (LEVEMIR) 100 UNIT/ML injection Inject into the skin., Until Discontinued, Historical Med      !! lidocaine (LIDODERM) 5 % Place 1 patch onto the skin every 24 hours Remove & Discard patch within 12 hours or as directed by MD, Starting Sat 02/17/2021, E-Rx      !! lidocaine (LIDODERM) 5 % Place 1 patch onto the skin every 24 hours Remove & Discard patch within 12 hours or as directed by MD, Starting Mon 04/30/2021, E-Rx      metFORMIN (GLUCOPHAGE) 850 MG tablet Take 850 mg by  mouth 2 (two) times daily with meals., Until Discontinued, Historical Med      Rivaroxaban (XARELTO PO) Take by mouth., Until Discontinued, Historical Med      vitamin B-12 (CYANOCOBALAMIN) 500 MCG tablet Take 500 mcg by mouth daily., Until Discontinued, Historical Med       !! - Potential duplicate medications found. Please discuss with provider.            REVIEW OF SYSTEMS   Review of Systems     See HPI      PHYSICAL EXAM     ED Triage Vitals [06/25/21 1223]   Enc Vitals Group      BP 169/77      Heart Rate 64      Resp Rate 16      Temp 98 F (36.7 C)      Temp Source Oral      SpO2 98 %      Weight 100 kg      Height       Head Circumference       Peak Flow       Pain Score       Pain Loc       Pain Edu?       Excl. in GC?      Physical Exam  Vitals and nursing note reviewed. Exam conducted with a chaperone present.   Constitutional:       General: She is not in acute distress.     Appearance: Normal appearance. She is normal weight. She is not ill-appearing, toxic-appearing or diaphoretic.   HENT:      Head: Normocephalic and atraumatic.      Right Ear: Tympanic membrane normal.      Left Ear: Tympanic membrane normal.      Mouth/Throat:      Mouth: Mucous membranes are moist.      Pharynx: No oropharyngeal exudate or posterior oropharyngeal erythema.   Eyes:      General: No scleral icterus.        Right eye: No discharge.         Left eye: No discharge.      Extraocular Movements: Extraocular movements intact.      Conjunctiva/sclera: Conjunctivae normal.      Pupils: Pupils are equal, round, and reactive to light.   Cardiovascular:      Rate and Rhythm: Normal rate.  Pulses: Normal pulses.   Pulmonary:      Effort: Pulmonary effort is normal. No respiratory distress.      Breath sounds: Normal breath sounds. No stridor. No wheezing, rhonchi or rales.   Chest:      Chest wall: No tenderness.   Abdominal:      General: Abdomen is flat. There is no distension.      Palpations: There is no mass.       Tenderness: There is no abdominal tenderness. There is no right CVA tenderness, left CVA tenderness, guarding or rebound.      Hernia: No hernia is present.   Musculoskeletal:         General: No swelling, tenderness, deformity or signs of injury. Normal range of motion.      Cervical back: Normal range of motion and neck supple. No rigidity or tenderness.      Right lower leg: No edema.      Left lower leg: No edema.   Lymphadenopathy:      Cervical: No cervical adenopathy.   Skin:     General: Skin is warm and dry.      Capillary Refill: Capillary refill takes less than 2 seconds.      Coloration: Skin is not jaundiced or pale.      Findings: No bruising, erythema, lesion or rash.   Neurological:      General: No focal deficit present.      Mental Status: She is alert and oriented to person, place, and time. Mental status is at baseline.      Cranial Nerves: No cranial nerve deficit.      Sensory: No sensory deficit.      Motor: No weakness.      Gait: Gait normal.   Psychiatric:         Mood and Affect: Mood normal.         Behavior: Behavior normal.         Thought Content: Thought content normal.         Judgment: Judgment normal.             MEDICAL DECISION MAKING     DISCUSSION    Assessment: Well-appearing hard of hearing older female, complaint of intermittent left side head and ear and behind ear pain on and off x4 weeks.    Patient who is very hard of hearing, particularly to the left ear, used to wear hearing aids is currently not wearing hearing aids.      Patient reports to her primary care doctor cleared out earwax a few weeks ago and this helped with hearing a bit however has had persistent intermittent left ear and left side of head pain.    Exam: Neurologically appropriate, aside from hard of hearing.  No focal neuro findings.  Patient speech is clear, no facial droop.  No erythema, edema or tenderness palpation to left side of head, face, tragus, postauricular area.  EACs are clear without  erythema edema or tenderness.  TMs are clear bilateral clear fluid, otherwise no bulging retraction or edema or purulence.    Patient's vitals show elevated blood pressure.  Patient took some more meds but not all of them.    External records review shows: Patient had telephone encounter with patient Raiford Simmonds at [facility] diabetes nephrology office for refill of insulin on 06/05/2021.    Social determinants reviewed patient is alert and oriented, insured, lives with  daughter she has lived with for the last 2 years.  Patient does not currently drive, daughter drives patient to appointments and transportation to and from today.  No social barriers to patient receiving care, following up with primary doctor, specialist return to emergency room as needed.    Plan: No signs of infection to the ear, foreign body, cerumen impaction.  No focal neuro findings.  However patient complaining of persistent ear and head pain we will do a CT scan, EKG, troponin, CXR, labs.    Reevaluation: Labs are stable BUN 32 creatinine 1.2 consistent with previous and recent labs CBC shows mild anemia platelets 408  also consistent with baseline labs.  Troponin is 13.5.    Chest x-ray interpreted me shows no acute cardiopulmonary disease.    EKG interpreted by me shows: sinus rhythm with first-degree AV block with frequent premature ventricular contractions rate of 70, left axis deviation and left ventricular hypertrophy, comparison 04/30/21, no significant change.    CT head shows:    IMPRESSION:    No acute intracerebral abnormality.  Discussed results with patient.  Patient still declining any medication for pain, states no pain at this time.  Patient remains neurologically appropriate.  Elevated blood pressure.  Patient denies any chest pain, shortness of breath, lightheadedness or blurred vision.  Patient's daughter is at bedside at this time.  Patient patient's vitals are  comfortable going home.        Discharge, treatment, follow up and return to ER precautions discussed with patient in detail.  Your CAT scan here today showed no acute abnormality.      Follow-up with your primary care doctor for intermittent chronic headaches, referral to neurology as needed.  Also for your chronic left sided ear pain and hearing loss you should be seen by ENT and audiology.      Tylenol has been helping your pain you can continue to take this with food 650 mg p.o. as needed, however you should not take this long-term.    Also your blood pressure is elevated you need to monitor this at home follow-up your primary doctor for adjustment of your medication as needed.  Any chest pain, shortness of breath, blurred vision, dizziness decreased urination, weakness return emergency room otherwise follow-up as discussed.    Headache     You have been treated for a headache.     Headaches are very common. Most of the time they are benign (not harmful). Some headaches can be very serious. Your headache appears to be benign. The doctor feels it is OK for you to go home.     If you continue to have headaches, or if this headache does not go away over the next few days, you should be evaluated by your regular doctor or a neurologist. Keeping a "headache diary" may help your doctor learn the cause of your headaches.     When you get a headache, write down:  What happens before your headache starts - Where you were, what you were doing, if you ate anything, and so on.  Where your pain is.  What kind-of pain you have - Sharp, aching, throbbing, burning.   What helps your headache get better.     Take your headache medication as directed. This is very important if your doctor has placed you on a daily medication to prevent headaches.     Return here or go to the nearest Emergency Department immediately if:  Your headache gets worse.  You  have a severe headache that starts suddenly.  Your head pain is different from  your normal headache.  You have a fever (temperature higher than 100.44F / 38C), especially with a stiff neck.  You feel numbness, tingling, or weakness in your arms or legs.  You pass out.  You have problems with your vision.  You vomit (throw up) and have trouble taking medication or keeping it down.     If you can't follow up with your doctor, or if at any time you feel you need to be rechecked or seen again, come back here or go to the nearest emergency department.Patient understands and agrees with plan, no further questions or concerns at this time.     I do not suspect severe sepsis or septic shock    Vital Signs: Reviewed the patient's vital signs.   Nursing Notes: Reviewed and utilized available nursing notes.  Medical Records Reviewed: Reviewed available past medical records.  Counseling: The emergency provider has spoken with the patient and discussed today's findings, in addition to providing specific details for the plan of care.  Questions are answered and there is agreement with the plan.      CARDIAC STUDIES    The following cardiac studies were independently interpreted by the Emergency Medicine Physician.  For full cardiac study results please see chart.    Monitor Strip  Interpreted by Romie JumperJennifer Tj Kitchings, PA-C  Rate: 70   Rhythm: SR   ST Changes: none    EKG Interpretation:  Signed and interpreted by Romie JumperJennifer Candy Ziegler, PA-C  Time Interpreted: 1430  Comparison: 04/30/2021  Rate: 70  Rhythm: SR  Axis: Left axis deviation  Intervals: normal  Blocks: None  ST segments: None  Interpretation: Abnormal EKG    EMERGENCY IMAGING STUDIES    The following imagine studies were independently interpreted by me (emergency physician):    Radiology:  Interpreted by me Romie Jumper(Chanetta Moosman, PA-C)  Study: Chest Xray   Results: No infiltrate. No pneumothorax. No hemothorax. No cardiomegaly. No CHF.  Impression: No acute intrathoracic abnormality.    RADIOLOGY IMAGING STUDIES      XR Chest  AP Portable   Final Result     Chronic findings with no acute cardiopulmonary process.      Mitali Bapna, MD   06/25/2021 3:05 PM      CT Head WO Contrast   Final Result    No acute intracerebral abnormality.      Note: Note that CT scanning at this site  utilizes multiple dose reduction techniques including automatic exposure control, adjustment of the MAA and/or KVP according to patient's size and use of iterative reconstruction technique      Laurena SlimmerNitin Kumar, MD   06/25/2021 1:46 PM              PULSE OXIMETRY    Oxygen Saturation by Pulse Oximetry: 99%  Interventions: none  Interpretation:  Normal.    EMERGENCY DEPT. MEDICATIONS      ED Medication Orders (From admission, onward)      None            LABORATORY RESULTS    Ordered and independently interpreted AVAILABLE laboratory tests. Please see results section in chart for full details.  Results for orders placed or performed during the hospital encounter of 06/25/21   CBC and differential   Result Value Ref Range    WBC 8.90 3.10 - 9.50 x10 3/uL    Hgb 11.1 (L) 11.4 - 14.8 g/dL  Hematocrit 34.7 34.7 - 43.7 %    Platelets 408 (H) 142 - 346 x10 3/uL    RBC 4.23 3.90 - 5.10 x10 6/uL    MCV 82.0 78.0 - 96.0 fL    MCH 26.2 25.1 - 33.5 pg    MCHC 32.0 31.5 - 35.8 g/dL    RDW 16 (H) 11 - 15 %    MPV 10.4 8.9 - 12.5 fL    Instrument Absolute Neutrophil Count 3.52 1.10 - 6.33 x10 3/uL    Neutrophils 39.5 None %    Lymphocytes Automated 42.6 None %    Monocytes 12.4 None %    Eosinophils Automated 4.4 None %    Basophils Automated 0.9 None %    Immature Granulocytes 0.2 None %    Nucleated RBC 0.0 0.0 - 0.0 /100 WBC    Neutrophils Absolute 3.52 1.10 - 6.33 x10 3/uL    Lymphocytes Absolute Automated 3.79 (H) 0.42 - 3.22 x10 3/uL    Monocytes Absolute Automated 1.10 (H) 0.21 - 0.85 x10 3/uL    Eosinophils Absolute Automated 0.39 0.00 - 0.44 x10 3/uL    Basophils Absolute Automated 0.08 0.00 - 0.08 x10 3/uL    Immature Granulocytes Absolute 0.02 0.00 - 0.07 x10 3/uL    Absolute NRBC 0.00 0.00 - 0.00 x10 3/uL    Comprehensive metabolic panel   Result Value Ref Range    Glucose 84 70 - 100 mg/dL    BUN 54.0 (H) 7.0 - 98.1 mg/dL    Creatinine 1.2 (H) 0.4 - 1.0 mg/dL    Sodium 191 478 - 295 mEq/L    Potassium 4.7 3.5 - 5.3 mEq/L    Chloride 106 99 - 111 mEq/L    CO2 25 17 - 29 mEq/L    Calcium 9.2 7.9 - 10.2 mg/dL    Protein, Total 7.4 6.0 - 8.3 g/dL    Albumin 3.4 (L) 3.5 - 5.0 g/dL    AST (SGOT) 18 5 - 41 U/L    ALT 12 0 - 55 U/L    Alkaline Phosphatase 79 37 - 117 U/L    Bilirubin, Total 0.3 0.2 - 1.2 mg/dL    Globulin 4.0 (H) 2.0 - 3.6 g/dL    Albumin/Globulin Ratio 0.9 0.9 - 2.2    Anion Gap 9.0 5.0 - 15.0    eGFR 43.7 (A) >=60 mL/min/1.73 m2   PT/APTT   Result Value Ref Range    PT 14.4 (H) 10.1 - 12.9 sec    PT INR 1.2 (H) 0.9 - 1.1    PTT 35 27 - 39 sec   High Sensitivity Troponin-I   Result Value Ref Range    hs Troponin-I 13.5 SEE BELOW ng/L   ECG 12 lead   Result Value Ref Range    Ventricular Rate 70 BPM    Atrial Rate 70 BPM    P-R Interval 284 ms    QRS Duration 134 ms    Q-T Interval 422 ms    QTC Calculation (Bezet) 455 ms    P Axis 20 degrees    R Axis -58 degrees    T Axis 82 degrees    IHS MUSE NARRATIVE AND IMPRESSION       SINUS RHYTHM WITH 1ST DEGREE A-V BLOCK FREQUENT PREMATURE VENTRICULAR COMPLEXES  LEFT AXIS DEVIATION  LEFT VENTRICULAR HYPERTROPHY WITH QRS WIDENING AND REPOLARIZATION ABNORMALITY  Cannot rule out SEPTAL INFARCT , AGE UNDETERMINED  ABNORMAL ECG  WHEN COMPARED WITH ECG OF 30-Apr-2021  19:25,  MINIMAL CRITERIA FOR SEPTAL INFARCT ARE NOW PRESENT         CRITICAL CARE/PROCEDURES    Procedures  No critical care time    DIAGNOSIS      Diagnosis:  Final diagnoses:   Acute nonintractable headache, unspecified headache type   Elevated blood pressure reading in office with diagnosis of hypertension   Ear pain, left       Disposition:  ED Disposition       ED Disposition   Discharge    Condition   --    Date/Time   Mon Jun 25, 2021  2:22 PM    Comment   Emily Stevens discharge to home/self  care.    Condition at disposition: Stable                 Prescriptions:  Discharge Medication List as of 06/25/2021  2:27 PM        START taking these medications    Details   acetaminophen (TYLENOL) 650 MG CR tablet Take 1 tablet (650 mg) by mouth every 6 (six) hours as needed for Pain (Headache) Do not exceed 3250 mg in any given 24 hour time window., Starting Mon 06/25/2021, No Print           CONTINUE these medications which have NOT CHANGED    Details   Cholecalciferol (VITAMIN D) 1000 UNIT tablet Take 1,000 Units by mouth daily., Until Discontinued, Historical Med      cloNIDine (CATAPRES) 0.2 MG tablet Take 0.2 mg by mouth 2 (two) times daily., Until Discontinued, Historical Med      dilTIAZem (CARDIZEM) 60 MG tablet Take 1 tablet (60 mg total) by mouth 4 (four) times daily, Starting Thu 06/05/2017, Print      Diltiazem HCl ER Beads (TIAZAC PO) Take by mouth., Until Discontinued, Historical Med      esomeprazole (NEXIUM) 40 MG capsule Take 40 mg by mouth every morning before breakfast., Until Discontinued, Historical Med      ferrous sulfate 325 (65 FE) MG tablet Take 325 mg by mouth every morning with breakfast., Until Discontinued, Historical Med      insulin aspart (NOVOLOG 70/30) (70-30) 100 UNIT/ML injection Inject into the skin 2 (two) times daily before meals., Until Discontinued, Historical Med      insulin detemir (LEVEMIR) 100 UNIT/ML injection Inject into the skin., Until Discontinued, Historical Med      !! lidocaine (LIDODERM) 5 % Place 1 patch onto the skin every 24 hours Remove & Discard patch within 12 hours or as directed by MD, Starting Sat 02/17/2021, E-Rx      !! lidocaine (LIDODERM) 5 % Place 1 patch onto the skin every 24 hours Remove & Discard patch within 12 hours or as directed by MD, Starting Mon 04/30/2021, E-Rx      metFORMIN (GLUCOPHAGE) 850 MG tablet Take 850 mg by mouth 2 (two) times daily with meals., Until Discontinued, Historical Med      Rivaroxaban (XARELTO PO) Take by mouth.,  Until Discontinued, Historical Med      vitamin B-12 (CYANOCOBALAMIN) 500 MCG tablet Take 500 mcg by mouth daily., Until Discontinued, Historical Med       !! - Potential duplicate medications found. Please discuss with provider.        STOP taking these medications       acetaminophen (TYLENOL) 325 MG tablet Comments:   Reason for Stopping:  Romie Jumper B, [place]  06/25/21 2342

## 2021-06-26 LAB — ECG 12-LEAD
Atrial Rate: 70 {beats}/min
P Axis: 20 degrees
P-R Interval: 284 ms
Q-T Interval: 422 ms
QRS Duration: 134 ms
QTC Calculation (Bezet): 455 ms
R Axis: -58 degrees
T Axis: 82 degrees
Ventricular Rate: 70 {beats}/min

## 2021-07-16 DIAGNOSIS — E785 Hyperlipidemia, unspecified: Secondary | ICD-10-CM | POA: Diagnosis not present

## 2021-07-16 DIAGNOSIS — I480 Paroxysmal atrial fibrillation: Secondary | ICD-10-CM | POA: Diagnosis not present

## 2021-07-16 DIAGNOSIS — R0600 Dyspnea, unspecified: Secondary | ICD-10-CM | POA: Diagnosis not present

## 2021-07-16 DIAGNOSIS — I1310 Hypertensive heart and chronic kidney disease without heart failure, with stage 1 through stage 4 chronic kidney disease, or unspecified chronic kidney disease: Secondary | ICD-10-CM | POA: Diagnosis not present

## 2021-07-30 DIAGNOSIS — I119 Hypertensive heart disease without heart failure: Secondary | ICD-10-CM | POA: Diagnosis not present

## 2021-08-07 DIAGNOSIS — R300 Dysuria: Secondary | ICD-10-CM | POA: Diagnosis not present

## 2021-08-07 DIAGNOSIS — R809 Proteinuria, unspecified: Secondary | ICD-10-CM | POA: Diagnosis not present

## 2021-08-16 DIAGNOSIS — N390 Urinary tract infection, site not specified: Secondary | ICD-10-CM | POA: Diagnosis not present

## 2021-08-16 DIAGNOSIS — I10 Essential (primary) hypertension: Secondary | ICD-10-CM | POA: Diagnosis not present

## 2021-08-17 DIAGNOSIS — I4891 Unspecified atrial fibrillation: Secondary | ICD-10-CM | POA: Diagnosis not present

## 2021-08-17 DIAGNOSIS — I10 Essential (primary) hypertension: Secondary | ICD-10-CM | POA: Diagnosis not present

## 2021-08-17 DIAGNOSIS — L989 Disorder of the skin and subcutaneous tissue, unspecified: Secondary | ICD-10-CM | POA: Diagnosis not present

## 2021-08-17 DIAGNOSIS — N189 Chronic kidney disease, unspecified: Secondary | ICD-10-CM | POA: Diagnosis not present

## 2021-08-17 DIAGNOSIS — Z1283 Encounter for screening for malignant neoplasm of skin: Secondary | ICD-10-CM | POA: Diagnosis not present

## 2021-08-17 DIAGNOSIS — E11638 Type 2 diabetes mellitus with other oral complications: Secondary | ICD-10-CM | POA: Diagnosis not present

## 2021-08-27 DIAGNOSIS — E1165 Type 2 diabetes mellitus with hyperglycemia: Secondary | ICD-10-CM | POA: Diagnosis not present

## 2021-08-27 DIAGNOSIS — I10 Essential (primary) hypertension: Secondary | ICD-10-CM | POA: Diagnosis not present

## 2021-08-27 DIAGNOSIS — N183 Chronic kidney disease, stage 3 unspecified: Secondary | ICD-10-CM | POA: Diagnosis not present

## 2021-08-27 DIAGNOSIS — N2581 Secondary hyperparathyroidism of renal origin: Secondary | ICD-10-CM | POA: Diagnosis not present

## 2021-08-27 DIAGNOSIS — D631 Anemia in chronic kidney disease: Secondary | ICD-10-CM | POA: Diagnosis not present

## 2021-08-27 DIAGNOSIS — I4891 Unspecified atrial fibrillation: Secondary | ICD-10-CM | POA: Diagnosis not present

## 2021-09-06 DIAGNOSIS — E785 Hyperlipidemia, unspecified: Secondary | ICD-10-CM | POA: Diagnosis not present

## 2021-09-06 DIAGNOSIS — E119 Type 2 diabetes mellitus without complications: Secondary | ICD-10-CM | POA: Diagnosis not present

## 2021-09-06 DIAGNOSIS — I480 Paroxysmal atrial fibrillation: Secondary | ICD-10-CM | POA: Diagnosis not present

## 2021-09-06 DIAGNOSIS — I10 Essential (primary) hypertension: Secondary | ICD-10-CM | POA: Diagnosis not present

## 2021-09-27 ENCOUNTER — Telehealth: Payer: Self-pay

## 2021-10-05 DIAGNOSIS — N2581 Secondary hyperparathyroidism of renal origin: Secondary | ICD-10-CM | POA: Diagnosis not present

## 2021-10-05 DIAGNOSIS — I10 Essential (primary) hypertension: Secondary | ICD-10-CM | POA: Diagnosis not present

## 2021-10-05 DIAGNOSIS — N183 Chronic kidney disease, stage 3 unspecified: Secondary | ICD-10-CM | POA: Diagnosis not present

## 2021-10-05 DIAGNOSIS — D631 Anemia in chronic kidney disease: Secondary | ICD-10-CM | POA: Diagnosis not present

## 2021-10-11 DIAGNOSIS — I10 Essential (primary) hypertension: Secondary | ICD-10-CM | POA: Diagnosis not present

## 2021-10-11 DIAGNOSIS — D631 Anemia in chronic kidney disease: Secondary | ICD-10-CM | POA: Diagnosis not present

## 2021-10-11 DIAGNOSIS — N2581 Secondary hyperparathyroidism of renal origin: Secondary | ICD-10-CM | POA: Diagnosis not present

## 2021-10-11 DIAGNOSIS — N183 Chronic kidney disease, stage 3 unspecified: Secondary | ICD-10-CM | POA: Diagnosis not present

## 2021-10-15 NOTE — Patient Outreach (Signed)
  Care Coordination    Name: [name] MRN: [id number] DOB: 02-May-1932   Care Coordination Outreach Attempts:  An unsuccessful telephone outreach was attempted today to offer the patient information about available care coordination services as a benefit of their health plan.   Follow Up Plan:  Additional outreach attempts will be made to offer the patient care coordination information and services.   Encounter Outcome:  No Answer  Care Coordination Interventions Activated:  No   Care Coordination Interventions:  No, not indicated    [facility] [phone]

## 2021-10-21 ENCOUNTER — Emergency Department: Payer: No Typology Code available for payment source

## 2021-10-21 ENCOUNTER — Observation Stay
Admission: EM | Admit: 2021-10-21 | Discharge: 2021-10-22 | Disposition: A | Discharge status: Home / Self Care | Payer: No Typology Code available for payment source | Attending: Internal Medicine | Admitting: Internal Medicine

## 2021-10-21 DIAGNOSIS — N183 Chronic kidney disease, stage 3 unspecified: Secondary | ICD-10-CM | POA: Insufficient documentation

## 2021-10-21 DIAGNOSIS — Z7984 Long term (current) use of oral hypoglycemic drugs: Secondary | ICD-10-CM | POA: Insufficient documentation

## 2021-10-21 DIAGNOSIS — E1165 Type 2 diabetes mellitus with hyperglycemia: Secondary | ICD-10-CM | POA: Insufficient documentation

## 2021-10-21 DIAGNOSIS — Z794 Long term (current) use of insulin: Secondary | ICD-10-CM | POA: Insufficient documentation

## 2021-10-21 DIAGNOSIS — E1122 Type 2 diabetes mellitus with diabetic chronic kidney disease: Secondary | ICD-10-CM | POA: Insufficient documentation

## 2021-10-21 DIAGNOSIS — R531 Weakness: Secondary | ICD-10-CM | POA: Insufficient documentation

## 2021-10-21 DIAGNOSIS — E785 Hyperlipidemia, unspecified: Secondary | ICD-10-CM | POA: Insufficient documentation

## 2021-10-21 DIAGNOSIS — E876 Hypokalemia: Secondary | ICD-10-CM | POA: Insufficient documentation

## 2021-10-21 DIAGNOSIS — I129 Hypertensive chronic kidney disease with stage 1 through stage 4 chronic kidney disease, or unspecified chronic kidney disease: Secondary | ICD-10-CM | POA: Insufficient documentation

## 2021-10-21 DIAGNOSIS — E871 Hypo-osmolality and hyponatremia: Principal | ICD-10-CM | POA: Insufficient documentation

## 2021-10-21 DIAGNOSIS — Z6841 Body Mass Index (BMI) 40.0 and over, adult: Secondary | ICD-10-CM | POA: Insufficient documentation

## 2021-10-21 DIAGNOSIS — Z20822 Contact with and (suspected) exposure to covid-19: Secondary | ICD-10-CM | POA: Insufficient documentation

## 2021-10-21 DIAGNOSIS — I4891 Unspecified atrial fibrillation: Secondary | ICD-10-CM | POA: Insufficient documentation

## 2021-10-21 DIAGNOSIS — M1811 Unilateral primary osteoarthritis of first carpometacarpal joint, right hand: Secondary | ICD-10-CM | POA: Diagnosis not present

## 2021-10-21 DIAGNOSIS — R079 Chest pain, unspecified: Secondary | ICD-10-CM | POA: Diagnosis not present

## 2021-10-21 LAB — BASIC METABOLIC PANEL
Anion Gap: 11 (ref 5.0–15.0)
BUN: 39 mg/dL — ABNORMAL HIGH (ref 7.0–21.0)
CO2: 26 mEq/L (ref 17–29)
Calcium: 9.4 mg/dL (ref 7.9–10.2)
Chloride: 101 mEq/L (ref 99–111)
Creatinine: 1.3 mg/dL — ABNORMAL HIGH (ref 0.4–1.0)
Glucose: 161 mg/dL — ABNORMAL HIGH (ref 70–100)
Potassium: 4 mEq/L (ref 3.5–5.3)
Sodium: 138 mEq/L (ref 135–145)
eGFR: 39.8 mL/min/{1.73_m2} — AB (ref >=60)

## 2021-10-21 LAB — CBC AND DIFFERENTIAL
Absolute NRBC: 0 10*3/uL (ref 0.00–0.00)
Basophils Absolute Automated: 0.06 10*3/uL (ref 0.00–0.08)
Basophils Automated: 0.6 %
Eosinophils Absolute Automated: 0.22 10*3/uL (ref 0.00–0.44)
Eosinophils Automated: 2.2 %
Hematocrit: 34.5 % — ABNORMAL LOW (ref 34.7–43.7)
Hgb: 11.4 g/dL (ref 11.4–14.8)
Immature Granulocytes Absolute: 0.03 10*3/uL (ref 0.00–0.07)
Immature Granulocytes: 0.3 %
Instrument Absolute Neutrophil Count: 5.29 10*3/uL (ref 1.10–6.33)
Lymphocytes Absolute Automated: 3.35 10*3/uL — ABNORMAL HIGH (ref 0.42–3.22)
Lymphocytes Automated: 33.6 %
MCH: 26.4 pg (ref 25.1–33.5)
MCHC: 33 g/dL (ref 31.5–35.8)
MCV: 79.9 fL (ref 78.0–96.0)
MPV: 9.9 fL (ref 8.9–12.5)
Monocytes Absolute Automated: 1.03 10*3/uL — ABNORMAL HIGH (ref 0.21–0.85)
Monocytes: 10.3 %
Neutrophils Absolute: 5.29 10*3/uL (ref 1.10–6.33)
Neutrophils: 53 %
Nucleated RBC: 0 /100 WBC (ref 0.0–0.0)
Platelets: 420 10*3/uL — ABNORMAL HIGH (ref 142–346)
RBC: 4.32 10*6/uL (ref 3.90–5.10)
RDW: 17 % — ABNORMAL HIGH (ref 11–15)
WBC: 9.98 10*3/uL — ABNORMAL HIGH (ref 3.10–9.50)

## 2021-10-21 LAB — HIGH SENSITIVITY TROPONIN-I: hs Troponin-I: 11.2 ng/L

## 2021-10-21 LAB — COMPREHENSIVE METABOLIC PANEL
ALT: 12 U/L (ref 0–55)
AST (SGOT): 18 U/L (ref 5–41)
Albumin/Globulin Ratio: 0.9 (ref 0.9–2.2)
Albumin: 3.4 g/dL — ABNORMAL LOW (ref 3.5–5.0)
Alkaline Phosphatase: 73 U/L (ref 37–117)
Anion Gap: 5 (ref 5.0–15.0)
BUN: 42 mg/dL — ABNORMAL HIGH (ref 7.0–21.0)
Bilirubin, Total: 0.3 mg/dL (ref 0.2–1.2)
CO2: 24 mEq/L (ref 17–29)
Calcium: 9.2 mg/dL (ref 7.9–10.2)
Chloride: 84 mEq/L — ABNORMAL LOW (ref 99–111)
Creatinine: 1.3 mg/dL — ABNORMAL HIGH (ref 0.4–1.0)
Globulin: 3.9 g/dL — ABNORMAL HIGH (ref 2.0–3.6)
Glucose: 156 mg/dL — ABNORMAL HIGH (ref 70–100)
Potassium: 3.1 mEq/L — ABNORMAL LOW (ref 3.5–5.3)
Protein, Total: 7.3 g/dL (ref 6.0–8.3)
Sodium: 113 mEq/L — CL (ref 135–145)
eGFR: 39.8 mL/min/{1.73_m2} — AB (ref >=60)

## 2021-10-21 LAB — URINALYSIS REFLEX TO MICROSCOPIC EXAM - REFLEX TO CULTURE
Bilirubin, UA: NEGATIVE
Blood, UA: NEGATIVE
Glucose, UA: NEGATIVE
Ketones UA: NEGATIVE
Leukocyte Esterase, UA: NEGATIVE
Nitrite, UA: NEGATIVE
Protein, UR: 100 — AB
Specific Gravity UA: 1.009 (ref 1.001–1.035)
Urine pH: 7 (ref 5.0–8.0)
Urobilinogen, UA: NEGATIVE mg/dL (ref 0.2–2.0)

## 2021-10-21 LAB — MAGNESIUM: Magnesium: 2 mg/dL (ref 1.6–2.6)

## 2021-10-21 LAB — OSMOLALITY: Osmolality: 304 mosm/kg — ABNORMAL HIGH (ref 280–300)

## 2021-10-21 LAB — COVID-19 (SARS-COV-2) & INFLUENZA  A/B, NAA (ROCHE LIAT)
Influenza A: NOT DETECTED
Influenza B: NOT DETECTED
SARS CoV 2 Overall Result: NOT DETECTED

## 2021-10-21 LAB — ECG 12-LEAD
Atrial Rate: 63 {beats}/min
P Axis: 7 degrees
P-R Interval: 280 ms
Q-T Interval: 438 ms
QRS Duration: 136 ms
QTC Calculation (Bezet): 448 ms
R Axis: -62 degrees
T Axis: 97 degrees
Ventricular Rate: 63 {beats}/min

## 2021-10-21 LAB — OSMOLALITY, URINE: Urine Osmolality: 312 mosm/kg (ref 300–1094)

## 2021-10-21 LAB — PROBNP: NT-proBNP: 391 pg/mL (ref 0–450)

## 2021-10-21 LAB — URIC ACID: Uric acid: 5.9 mg/dL (ref 2.6–7.1)

## 2021-10-21 LAB — GLUCOSE WHOLE BLOOD - POCT: Whole Blood Glucose POCT: 162 mg/dL — ABNORMAL HIGH (ref 70–100)

## 2021-10-21 LAB — LIPASE: Lipase: 16 U/L (ref 8–78)

## 2021-10-21 LAB — CK: Creatine Kinase (CK): 52 U/L (ref 29–233)

## 2021-10-21 MED ORDER — POTASSIUM CHLORIDE 10 MEQ/100ML IV SOLN
10.0000 meq | INTRAVENOUS | Status: DC | PRN
Start: 2021-10-21 — End: 2021-10-22
  Filled 2021-10-21: qty 100

## 2021-10-21 MED ORDER — SODIUM CHLORIDE 0.9 % IV BOLUS
500.0000 mL | Freq: Once | INTRAVENOUS | Status: AC
Start: 2021-10-21 — End: 2021-10-21
  Administered 2021-10-21: 500 mL via INTRAVENOUS

## 2021-10-21 MED ORDER — RIVAROXABAN 15 MG PO TABS
15.0000 mg | ORAL_TABLET | Freq: Every day | ORAL | Status: DC
Start: 2021-10-21 — End: 2021-10-22
  Administered 2021-10-22: 15 mg via ORAL
  Filled 2021-10-21: qty 1

## 2021-10-21 MED ORDER — POTASSIUM CHLORIDE CRYS ER 20 MEQ PO TBCR
0.0000 meq | EXTENDED_RELEASE_TABLET | ORAL | Status: DC | PRN
Start: 2021-10-21 — End: 2021-10-22

## 2021-10-21 MED ORDER — DILTIAZEM HCL ER COATED BEADS 120 MG PO CP24
120.0000 mg | ORAL_CAPSULE | Freq: Every day | ORAL | Status: DC
Start: 2021-10-22 — End: 2021-10-22
  Administered 2021-10-22: 120 mg via ORAL
  Filled 2021-10-21: qty 1

## 2021-10-21 MED ORDER — HYDRALAZINE HCL 20 MG/ML IJ SOLN
10.0000 mg | Freq: Four times a day (QID) | INTRAMUSCULAR | Status: DC | PRN
Start: 2021-10-21 — End: 2021-10-22
  Administered 2021-10-21: 10 mg via INTRAVENOUS
  Filled 2021-10-21: qty 1

## 2021-10-21 MED ORDER — CLONIDINE HCL 0.1 MG PO TABS
0.1000 mg | ORAL_TABLET | Freq: Three times a day (TID) | ORAL | Status: DC
Start: 2021-10-22 — End: 2021-10-22
  Administered 2021-10-22 (×2): 0.1 mg via ORAL
  Filled 2021-10-21 (×3): qty 1

## 2021-10-21 MED ORDER — CLONIDINE HCL 0.1 MG PO TABS
0.1000 mg | ORAL_TABLET | Freq: Two times a day (BID) | ORAL | Status: DC
Start: 2021-10-21 — End: 2021-10-21
  Administered 2021-10-21: 0.1 mg via ORAL
  Filled 2021-10-21: qty 1

## 2021-10-21 MED ORDER — VALSARTAN 80 MG PO TABS
320.0000 mg | ORAL_TABLET | Freq: Every day | ORAL | Status: DC
Start: 2021-10-22 — End: 2021-10-21

## 2021-10-21 NOTE — ED Notes (Signed)
Delay in bloodwork and IV. IV attempted twice by primary RN without success. When charge RN went to attempt IV patient insisted on using restroom and going to XR first.

## 2021-10-21 NOTE — Consults (Signed)
Initially consulted this evening for hypoNa, with serum Na of 113. Spoke to ICU NP, as repeat sNa now normal at 138, with minimal IVF given between labs from 18:58 and 22:09. Will cancel consult for now and asked ICU to trend labs overnight and call if hyponatremia recurs.     Ellyn Hack, [facility]  [facility]  [phone]

## 2021-10-21 NOTE — ED Notes (Signed)
Phlebotomy with patient.  

## 2021-10-21 NOTE — ED Notes (Signed)
Ultrasound guided IV placement  unsuccessful. Medication not administered at this time due to no IV. Provider to be updated. Medication to be administered once IV placed.

## 2021-10-21 NOTE — ED Notes (Signed)
Pt informed need for urine specimen collection.

## 2021-10-21 NOTE — EDIE (Signed)
[facility]?NOTIFICATION?10/21/2021 14:51?Jabier GaussDUBOSE, Genetta E?MRN: [id number]    [facility] [facility] encounter information:   VWU:[id number]  Account [id number]  Billing Account [id number]:?[id number]      Criteria Met      5 ED Visits in 12 Months    Security and Safety  No Security Events were found.  ED Care Guidelines  There are currently no ED Care Guidelines for this patient. Please check your facility's medical records system.        Prescription Monitoring Program  000??- Narcotic Use Score  000??- Sedative Use Score  000??- Stimulant Use Score  000??- Overdose Risk Score  - All Scores range from 000-999 with 75% of the population scoring < 200 and on 1% scoring above 650  - The last digit of the narcotic, sedative, and stimulant score indicates the number of active prescriptions of that type  - Higher Use scores correlate with increased prescribers, pharmacies, mg equiv, and overlapping prescriptions  - Higher Overdose Risk Scores correlate with increased risk of unintentional overdose death   Concerning or unexpectedly high scores should prompt a review of the PMP record; this does not constitute checking PMP for prescribing purposes.    E.D. Visit Count (12 mo.)  Facility Visits   [place] [facility]   Total 6   Note: Visits indicate total known visits.     Recent Emergency Department Visit Summary  [facility]oses [facility] Chief Complaint    Oct [facility], 2021  [place] [facility]  [place]  [facility]gency      swollen hands,      Jun 25, 2021  [place] [facility]  [place]  [facility]      Otalgia, left ear      Headache, unspecified      Essential (primary) hypertension      Otalgia      Headache      Headache; Ear Pain      Apr 30, 2021  [place] [facility]  [place]  [place]  Emergency      Essential (primary) hypertension      Spondylosis without myelopathy or radiculopathy, lumbar region      Other intervertebral disc degeneration, thoracic region       Other specified disorders of bone density and structure, left shoulder      Primary osteoarthritis, left shoulder      Back Pain      Shoulder Pain      Back Pain; Shoulder Pain      Feb 17, 2021  [facility]  [place]  [place]  Emergency      COVID-19      Other chest pain      Nausea      Chest Pain      Emesis      Abdominal Pain      Pain in chest when coughs      Jan 26, 2021  [place] [facility]  [place]  [place]  [facility]      Unspecified fracture of the lower end of left radius, initial encounter for closed fracture      Hand Pain      Arm Pains      Jan 17, 2021  [place] [facility]  [place]  Emergency      Unspecified fracture of the lower end of left radius, initial encounter for  closed fracture      Fall      Fall;Arm pain        Recent Inpatient Visit Summary  No Recent Inpatient Visits were found.  Care Team  No Care Team was found.  [facility]  This patient has registered at the [facility]   For more information visit: [email or web address]     PLEASE NOTE:     1.   Any care recommendations and other clinical information are provided as guidelines or for historical purposes only, and providers should exercise their own clinical judgment when providing care.    2.   You may only use this information for purposes of treatment, payment or health care operations activities, and subject to the limitations of applicable [facility] Policies.    3.   You should consult directly with the organization that provided a care guideline or other clinical history with any questions about additional information or accuracy or completeness of information provided.    ? 2023 [employer] - [email or web address]

## 2021-10-21 NOTE — H&P (Signed)
[facility] ([facility])  History and Physical      Patient Name: Emily Stevens  MRN: [id number]  Room: E 01/E01  Code Status: Full Code      Assessment & Plan   MSICU Attending Assessment/Plan:                  Chief Complaint / Primary Reason for MSICU Evaluation   Generalized fatigue    History of Presenting Illness   Emily Stevens is a 88 y.o. female with PMH of HTN, HLD, DM 2,  and diabetic nephropathy with baseline crt of 1.5 who presented to the ER 10/1 with vague complaints of generalized weakness. Unknown when this began. In ER labs significant for sodium of 113. Nephrologist MD Sherryll Burger was contacted and plan for fluid restriction and 500 ml NS bolus made since patient was asymptomatic. Patient is being admitted to ICU for severe hyponatremia.     Subjective   Past Medical History:     Past Medical History:   Diagnosis Date    Angioedema     Diabetes mellitus     Hypertension        Past Surgical History:     Past Surgical History:   Procedure Laterality Date    HERNIA REPAIR      HYSTERECTOMY         Family History:   History reviewed. No pertinent family history.    Social History:     Social History     Socioeconomic History    Marital status: Divorced     Spouse name: Not on file    Number of children: Not on file    Years of education: Not on file    Highest education level: Not on file   Occupational History    Not on file   Tobacco Use    Smoking status: Never    Smokeless tobacco: Never   Vaping Use    Vaping Use: Never used   Substance and Sexual Activity    Alcohol use: No    Drug use: No    Sexual activity: Not on file   Other Topics Concern    Not on file   Social History Narrative    Not on file     Social Determinants of Health     Financial Resource Strain: Not on file   Food Insecurity: Not on file   Transportation Needs: Not on file   Physical Activity: Not on file   Stress: Not on file   Social Connections: Not on file   Intimate Partner Violence: Not on file    Housing Stability: Not on file       Allergies:     Allergies   Allergen Reactions    Ace Inhibitors     Dye [Iodinated Contrast Media]     Penicillins     Prednisolone     Sulfa Antibiotics           Objective   Physical Examination   Vitals Temp:  [98.4 F (36.9 C)-98.6 F (37 C)]   Heart Rate:  [67-73]   Resp Rate:  [18-22]   BP: (145-198)/(67-85)   SpO2:  [95 %-100 %]  // Temperature with 24 range  Vent Settings    Physical Exam    Neuro:    Non-focal neuro exam alert & oriented x 3  mild forgetfullness    Lungs:   clear to auscultation, no wheezes, rales or rhonchi,  symmetric air entry, no tachypnea, retractions or cyanosis     Cardiac:   normal rate, regular rhythm, normal S1, S2, no murmurs, rubs, clicks or gallops     Abdomen:    soft, nontender, nondistended, no masses or organomegaly     Extremities:   peripheral pulses normal +1 pitting edema to lower extremities    Skin:     Warm, dry and intact        Assessment   Emily Stevens is a 88 y.o. female who presents with hyponatremia.        Patient has BMI=There is no height or weight on file to calculate BMI.  Diagnosis: Obesity Class 3 (formerly known as Morbid Obesity) based on BMI criteria       Recent Labs     10/21/21  1858   Sodium 113*     Diagnosis: Severe Hyponatremia      Recent Labs     10/21/21  1858   Potassium 3.1*     Diagnosis: Hypokalemia         Neuro   Neuro High Impact Dx: None    Cardiovascular   Cardiac High Impact Dx: None    Pulmonary  None    Renal  Renal High Impact Dx: Chronic Kidney Disease (Stage: 3)    Infections Disease  Infectious Disease High Impact Dx: None    Hematology  Heme High Impact Dx: None    Endo/Rheum  Endo High Impact: Acute Glycemic Conditions (hyperglycemia, hypoglycemia)      ICU Checklist  Sedation:   CAM-ICU:     CAM ICU:   Last Documented RASS:     Currently ordered infusions:    Reviewed: Yes   Mobility:   Current Mobility Level:    Current PT Order: No  Current OT Order: No Reviewed: Yes   Respiratory  (n/a if blank):   Ventilator Time:    Last Recorded Vent Mode:    Reviewed: Yes   Gastrointenstinal  Last Bowel Movement:   No data recorded Reviewed: Yes   CAUTI Prevention (n/a if blank):  Foley Day:        Reviewed: Yes   Blood Steam Infection Prevention (n/a if blank):    Reviewed: Yes   DVT Chemoprophylaxis (none if blank):   rivaroxaban (XARELTO) tablet 15 mg  Reviewed: Yes       Plan     NEUROLOGICAL:   Delirium precautions including maintenance of day/night wake cycles, frequent reorientation as needed    CARDIOVASCULAR:   #Hypertension  #History of A-fib  #Hyperlipedemia  Clonidine 0.1mg  BID  Diltiazem ER 120 mg daily  Xarelto 15mg  daily  Blood pressure goal MAP >65 SBP <170    PULMONARY:   Supplemental oxygen as needed to keep sat > 92%  Currently on RA    RENAL:   #CKD 3  baseline crt 1.2-15  Avoid nephrotoxins    #Hyponatremia  Stop chlorothiadone  1000ml free water fluid restriction  Q4 sodium checks  Serum osmolality- 304?  Urine osmo- 312  Check triglycerides, urine lytes, urine Na    #Hypokalemia  Replacements as needed    GASTROINTESTINAL:  At risk for constipation  Nutrition: Cardiac with 1000ml free water fluid restriction  Bowel Regimen: senna    INFECTIOUS DISEASE:   No active issues      HEMATOLOGY/ONCOLOGY:   No active issues    ENDOCRONOLOGY/RHEUMATOLOGY:   #Diabetes Mellitus  Correctional insulin  Home regimin: 20 units lispro in AM  8 units in PM    SKIN  No active issues           This patient is critically ill with life-threatening condition(s) and a high probability of sudden clinically significant deterioration due to the condition(s) noted in the assessment and plan, which requires the highest level of physician/advance practice provider preparedness to intervene urgently. Full attention to the direct care of this patient was provided for the period of time noted below. Any critical care time performed today is exclusive of teaching and billable procedures and not overlapping with any  other physicians or advance practice providers.    I have personally assessed the patient and based my assessment and medical decision-making on a review of the patient's history and 24-hour interval events along with medical records, physical examination, vital signs, analysis of recent laboratory results, evaluation of radiology images, monitoring data for potential decompensation, and additional findings found in detail within ICU team notes. The findings and plan of care was discussed with the care team.    Total critical care time: 52 minutes during this encounter.    Harrel Lemon, NP   10/21/2021 11:00 PM

## 2021-10-21 NOTE — ED Provider Notes (Signed)
EMERGENCY DEPARTMENT NOTE     Patient initially seen and examined at   ED PHYSICIAN ASSIGNED       Date/Time Event User Comments    10/21/21 1609 Physician Assigned Anaisa Radi A Zhanae Proffit, Antonietta Barcelona, MD assigned as Attending           ED MIDLEVEL (APP) ASSIGNED       None            HISTORY OF PRESENT ILLNESS   Translator Used : No    Chief Complaint: Swollen Hands and Generalized weakness     88 y.o. female with past medical history HTN, HLD presents with complaint of hand swelling as well as abdominal pain.  Patient states for the past 2 days she has been feeling unwell.  She says she has had swelling of her hands most notable in her right hand specifically right ring finger.  She has noted intermittent dull headaches, for which she is not having now.  She also notes intermittent abdominal cramping that is again resolved.  She generally does not feel well but is unable to articulate this further.  No fevers or chills.  No worsening confusion.  No chest pain, productive cough or shortness of breath.  No vomiting or diarrhea.  No worsening lower extremity pain or swelling.    Independent Historian (other than patient): Family (list in HPI) Daughter    Additional History Provided by Independent Historian:  MEDICAL HISTORY     Past Medical History:  Past Medical History:   Diagnosis Date    Angioedema     Diabetes mellitus     Hypertension        Past Surgical History:  Past Surgical History:   Procedure Laterality Date    HERNIA REPAIR      HYSTERECTOMY         Social History:  Social History     Socioeconomic History    Marital status: Divorced   Tobacco Use    Smoking status: Never    Smokeless tobacco: Never   Vaping Use    Vaping Use: Never used   Substance and Sexual Activity    Alcohol use: No    Drug use: No       Family History:  History reviewed. No pertinent family history.    Outpatient Medication:  Current Discharge Medication List        CONTINUE these medications which have NOT CHANGED    Details    cloNIDine (CATAPRES) 0.2 MG tablet Take 0.5 tablets (0.1 mg) by mouth 3 (three) times daily      !! insulin lispro protamine-lispro 75-25 MIXTURE (HumaLOG MIX 75-25) (75-25) 100 UNIT/ML Suspension injection Inject 8 Units into the skin nightly      !! insulin lispro protamine-lispro 75-25 MIXTURE (HumaLOG MIX 75-25) (75-25) 100 UNIT/ML Suspension injection Inject 20 Units into the skin daily      losartan (COZAAR) 100 MG tablet Take 1 tablet (100 mg) by mouth daily Has not taken this week.      acetaminophen (TYLENOL) 650 MG CR tablet Take 1 tablet (650 mg) by mouth every 6 (six) hours as needed for Pain (Headache) Do not exceed 3250 mg in any given 24 hour time window.  Refills: 0      chlorthalidone 25 MG tablet Take 1 tablet (25 mg) by mouth daily      Cholecalciferol (VITAMIN D) 1000 UNIT tablet Take 1 tablet (1,000 Units) by mouth daily  dilTIAZem (CARDIZEM) 60 MG tablet Take 1 tablet (60 mg total) by mouth 4 (four) times daily  Qty: 60 tablet, Refills: 0      Diltiazem HCl ER Beads (TIAZAC PO) Take by mouth.      esomeprazole (NEXIUM) 40 MG capsule Take 1 capsule (40 mg) by mouth every morning before breakfast      ferrous sulfate 325 (65 FE) MG tablet Take 1 tablet (325 mg) by mouth every morning with breakfast      insulin aspart (NOVOLOG 70/30) (70-30) 100 UNIT/ML injection Inject into the skin 2 (two) times daily before meals.      insulin detemir (LEVEMIR) 100 UNIT/ML injection Inject into the skin.      !! lidocaine (LIDODERM) 5 % Place 1 patch onto the skin every 24 hours Remove & Discard patch within 12 hours or as directed by MD  Qty: 15 patch, Refills: 0      !! lidocaine (LIDODERM) 5 % Place 1 patch onto the skin every 24 hours Remove & Discard patch within 12 hours or as directed by MD  Qty: 6 patch, Refills: 0      metFORMIN (GLUCOPHAGE) 850 MG tablet Take 1 tablet (850 mg) by mouth 2 (two) times daily with meals      vitamin B-12 (CYANOCOBALAMIN) 500 MCG tablet Take 1 tablet (500 mcg) by  mouth daily       !! - Potential duplicate medications found. Please discuss with provider.            REVIEW OF SYSTEMS   Review of Systems   Constitutional:  Positive for activity change, appetite change and fatigue. Negative for fever.   HENT:  Negative for sore throat.    Eyes:  Negative for visual disturbance.   Respiratory:  Negative for cough and shortness of breath.    Cardiovascular:  Negative for chest pain and palpitations.   Gastrointestinal:  Negative for abdominal pain, nausea and vomiting.   Genitourinary:  Negative for dysuria.   Musculoskeletal:  Positive for arthralgias, joint swelling and myalgias.   Skin:  Negative for wound.   Neurological:  Positive for weakness. Negative for headaches.   Psychiatric/Behavioral:  Negative for confusion.    All other systems reviewed and are negative.   See History of Present Illness  PHYSICAL EXAM     ED Triage Vitals [10/21/21 1550]   Enc Vitals Group      BP 145/67      Heart Rate 70      Resp Rate 18      Temp 98.4 F (36.9 C)      Temp Source Oral      SpO2 100 %      Weight       Height       Head Circumference       Peak Flow       Pain Score       Pain Loc       Pain Edu?       Excl. in GC?      Physical Exam  Vitals and nursing note reviewed.   Constitutional:       General: She is not in acute distress.     Appearance: Normal appearance. She is not ill-appearing.   HENT:      Head: Normocephalic and atraumatic.      Mouth/Throat:      Mouth: Mucous membranes are moist.   Eyes:      Extraocular  Movements: Extraocular movements intact.      Pupils: Pupils are equal, round, and reactive to light.      Comments: Pupils 2mm round reactive to light and accommodation bilaterally   Neck:      Comments: Full Range of motion of neck, supple, no meningismus  Cardiovascular:      Rate and Rhythm: Normal rate and regular rhythm.      Pulses: Normal pulses.      Heart sounds: Normal heart sounds.   Pulmonary:      Effort: Pulmonary effort is normal.      Comments:  Diminished Breath sounds in bilateral lower lobes, no increased work of breathing  Abdominal:      General: Abdomen is flat.      Palpations: Abdomen is soft.      Comments: soft, nontender, no rebound or guarding   Musculoskeletal:         General: Normal range of motion.      Cervical back: Normal range of motion and neck supple.      Right lower leg: Edema present.      Left lower leg: Edema present.      Comments: 1+ nonpitting edema bilateral lower extremities.  There is some swelling at the PIP joint of the right ring finger mid range of motion. 2+ radial and dorsalis pedis pulses bilaterally    Skin:     General: Skin is warm.      Capillary Refill: Capillary refill takes less than 2 seconds.   Neurological:      General: No focal deficit present.      Mental Status: She is alert and oriented to person, place, and time.      Comments: Cranial nerves II to XII grossly intact, 5 out of 5 motor strength upper and lower extremities bilaterally, sensation intact upper and lower extremities bilaterally, speech fluent, gait not assessed due to weakness     Psychiatric:         Mood and Affect: Mood normal.          MEDICAL DECISION MAKING     PRIMARY PROBLEM LIST      {CEP ACUITY:59028} DIAGNOSIS:***  {Chronic Illness Impacting Care of the above problem:59030} {Explain (Optional):59078}  {Differential Diagnosis:59053}    DISCUSSION      ***    {If patient is being hospitalized is severe sepsis or septic shock suspected?:59467}      {Was management discussed with a consultant?:59037}  {Was the decision around the need for surgery discussed with consultant:59056::"N/A"}  {External Records Reviewed?:59023}    Additional Notes    {Diagnostic test considered and not performed:59031::"N/A"}  {Prescription medications considered and not given:59033::"N/A"}  {Hospitalization considered but not done:59032::"N/A"}  {Social Determinants of Health Considerations:59036::"N/A"}  {Was there decision to not resuscitate or to  de-escalate care due to poor prognosis?:59057}  ED Course as of 10/22/21 0028   Sun Oct 21, 2021   1647 CXR- IMPRESSION:      No acute cardiopulmonary disease. [MR]   1938 Hand XRY - IMPRESSION:      No acute osseous abnormality. [MR]   2120 Spoke with nephrology - Dr. Sherryll Burger - recommends free water restriction of 1L a day, recommends bolus of NS x 1. No need for more rapid correction, recommends serial sodium levels [MR]   2130 Spoke with eICU Dr. Jonny Ruiz - agrees with plan for ICU level of care, recommends calling ICU midlevel. Can accept under Dr. Velda Shell  [MR]  2137 Spoke with Midlevel ICU Dahlia Client - will consult on patient [MR]      ED Course User Index  [MR] Ashliegh Parekh, Antonietta Barcelona, MD         Vital Signs: Reviewed the patient's vital signs.   Nursing Notes: Reviewed and utilized available nursing notes.  Medical Records Reviewed: Reviewed available past medical records.  Counseling: The emergency provider has spoken with the patient and discussed today's findings, in addition to providing specific details for the plan of care.  Questions are answered and there is agreement with the plan.      MIPS DOCUMENTATION    {ORAL ANTIBIOTICS given for otitis externa due to:59079}  {ACUTE BRONCHITIS Medical reasoning for giving this patient oral antibiotics for acute bronchitis are the following (Optional):59080}  {Medical reasoning for giving this patient oral antibiotics for acute sinusitis are the following (Optional):59081}  {HEAD TRAUMA Indications for head CT due to trauma (Optional):59082}    CARDIAC STUDIES    The following cardiac studies were independently interpreted by me the Emergency Medicine Provider.  For full cardiac study results please see chart.    {Monitor Strip Interpretation:59688}  {Rate:59685}  {Rhythm:59687}  {ST segments:59689}    EKG 1 interpreted by me (ED provider)  Comparison: Yes.  No acute changes.  DATE: 06/25/21  Rate: 60-100  Rhythm: Normal Sinus Rhythm  ST segments: No acute  changes  Impression -sinus rhythm, rate 63, QTc 448, no acute ischemic changes, grossly unchanged from prior  {EKG interpretation:59690}    {EKG interpretation:59684}  {Comparison:59859}  {Rate:59685}  {Rhythm:59687}  {ST segments:59689}  {EKG interpretation:59690}  EMERGENCY IMAGING STUDIES    The following imagine studies were independently interpreted by me (emergency medicine provider):    {Xray interpreted by ED provider? (Optional):59468} {SIDE (Optional):59475}  {Comparison:59859}  {RESULT:59469}  {IMPRESSION:59470}    {CT interpreted by provider? (Optional):59471}  {Comparison:59859}  {RUEAVW:09811}  {IMPRESSION:59474}  RADIOLOGY IMAGING STUDIES      Hand Right PA Lateral and Oblique   Final Result      No acute osseous abnormality.      Rozann Lesches, MD   10/21/2021 6:35 PM      Chest AP Portable   Final Result      No acute cardiopulmonary disease.      Rozann Lesches, MD   10/21/2021 4:20 PM          EMERGENCY DEPT. MEDICATIONS      ED Medication Orders (From admission, onward)      Start Ordered     Status Ordering Provider    10/22/21 0900 10/21/21 2215  dilTIAZem (CARDIZEM CD) 24 hr capsule 120 mg  Daily        Route: Oral  Ordered Dose: 120 mg       Acknowledged Harrel Lemon    10/22/21 0900 10/21/21 2216    Daily        Route: Oral  Ordered Dose: 320 mg       Discontinued Harrel Lemon    10/21/21 2239 10/21/21 2215  rivaroxaban (XARELTO) tablet 15 mg  Daily with dinner        Route: Oral  Ordered Dose: 15 mg       Last MAR action: Given by Other Harrel Lemon    10/21/21 2234 10/21/21 2215    Every 12 hours scheduled        Route: Oral  Ordered Dose: 0.1 mg       Discontinued Harrel Lemon    10/21/21 2133 10/21/21 2132  sodium chloride 0.9 % bolus 500 mL  Once        Route: Intravenous  Ordered Dose: 500 mL       Last MAR action: Stopped Shambhavi Salley A    10/21/21 2045 10/21/21 2046  potassium chloride 10 mEq in 100 mL IVPB (premix)  As needed        Route: Intravenous  Ordered Dose: 10 mEq      See  Hyperspace for full Linked Orders Report.    Acknowledged Harrel Lemon    10/21/21 2045 10/21/21 2046  potassium chloride (KLOR-CON M20) CR tablet 0-40 mEq  As needed        Route: Oral  Ordered Dose: 0-40 mEq      See Hyperspace for full Linked Orders Report.    Acknowledged Harrel Lemon            LABORATORY RESULTS    Ordered and independently interpreted AVAILABLE laboratory tests.   Results       Procedure Component Value Units Date/Time    Glucose Whole Blood - POCT [[id number]]  (Abnormal) Collected: 10/22/21 0004     Updated: 10/22/21 0007     Whole Blood Glucose POCT 150 mg/dL     TSH [[id number]] Collected: 10/21/21 2209    Specimen: Blood Updated: 10/22/21 0003     TSH 1.31 uIU/mL     Uric acid [[id number]] Collected: 10/21/21 2209    Specimen: Blood Updated: 10/21/21 2348     Uric acid 5.9 mg/dL     Basic Metabolic Panel [[id number]]  (Abnormal) Collected: 10/21/21 2209    Specimen: Blood Updated: 10/21/21 2315     Glucose 161 mg/dL      BUN 62.9 mg/dL      Creatinine 1.3 mg/dL      Calcium 9.4 mg/dL      Sodium 528 mEq/L      Potassium 4.0 mEq/L      Chloride 101 mEq/L      CO2 26 mEq/L      Anion Gap 11.0     eGFR 39.8 mL/min/1.73 m2     Cortisol [[id number]] Collected: 10/21/21 2209    Specimen: Blood Updated: 10/21/21 2209    Narrative:      Aliquot serum prior to transport to ICL    Glucose Whole Blood - POCT [[id number]]  (Abnormal) Collected: 10/21/21 2133     Updated: 10/21/21 2136     Whole Blood Glucose POCT 162 mg/dL     High Sensitivity Troponin-I [[id number]] Collected: 10/21/21 1858    Specimen: Blood Updated: 10/21/21 2131     hs Troponin-I 11.2 ng/L     Urinalysis Reflex to Microscopic Exam- Reflex to Culture [[id number]]  (Abnormal) Collected: 10/21/21 2041     Updated: 10/21/21 2119     Urine Type Urine, Clean Ca     Color, UA Yellow     Clarity, UA Clear     Specific Gravity UA 1.009     Urine pH 7.0     Leukocyte Esterase, UA Negative     Nitrite, UA Negative     Protein, UR 100      Glucose, UA Negative     Ketones UA Negative     Urobilinogen, UA Negative mg/dL      Bilirubin, UA Negative     Blood, UA Negative     RBC, UA 0 - 2 /hpf      Squamous Epithelial Cells, Urine 0 - 5 /hpf     CBC  and differential [[id number]]  (Abnormal) Collected: 10/21/21 1858    Specimen: Blood Updated: 10/21/21 2114     WBC 9.98 x10 3/uL      Hgb 11.4 g/dL      Hematocrit 53.6 %      Platelets 420 x10 3/uL      RBC 4.32 x10 6/uL      MCV 79.9 fL      MCH 26.4 pg      MCHC 33.0 g/dL      RDW 17 %      MPV 9.9 fL      Instrument Absolute Neutrophil Count 5.29 x10 3/uL      Neutrophils 53.0 %      Lymphocytes Automated 33.6 %      Monocytes 10.3 %      Eosinophils Automated 2.2 %      Basophils Automated 0.6 %      Immature Granulocytes 0.3 %      Nucleated RBC 0.0 /100 WBC      Neutrophils Absolute 5.29 x10 3/uL      Lymphocytes Absolute Automated 3.35 x10 3/uL      Monocytes Absolute Automated 1.03 x10 3/uL      Eosinophils Absolute Automated 0.22 x10 3/uL      Basophils Absolute Automated 0.06 x10 3/uL      Immature Granulocytes Absolute 0.03 x10 3/uL      Absolute NRBC 0.00 x10 3/uL     Urine Osmolality [[id number]] Collected: 10/21/21 2041    Specimen: Urine Updated: 10/21/21 2107     Urine Osmolality 312 mosm/kg     Osmolality [[id number]]  (Abnormal) Collected: 10/21/21 1858    Specimen: Blood Updated: 10/21/21 2107     Osmolality 304 mosm/kg     Urine Sodium Random [[id number]] Collected: 10/21/21 2041    Specimen: Urine Updated: 10/21/21 2041    Urine Creatinine Random [[id number]] Collected: 10/21/21 2041    Specimen: Urine Updated: 10/21/21 2041    Lipase [[id number]] Collected: 10/21/21 1858    Specimen: Blood Updated: 10/21/21 2021     Lipase 16 U/L     Magnesium [[id number]] Collected: 10/21/21 1858    Specimen: Blood Updated: 10/21/21 2021     Magnesium 2.0 mg/dL     Creatine Kinase (CK) [[id number]] Collected: 10/21/21 1858    Specimen: Blood Updated: 10/21/21 2021     Creatine Kinase (CK) 52 U/L      Comprehensive metabolic panel [[id number]]  (Abnormal) Collected: 10/21/21 1858     Updated: 10/21/21 2021     Glucose 156 mg/dL      BUN 07.3 mg/dL      Creatinine 1.3 mg/dL      Sodium 710 mEq/L      Potassium 3.1 mEq/L      Chloride 84 mEq/L      CO2 24 mEq/L      Calcium 9.2 mg/dL      Protein, Total 7.3 g/dL      Albumin 3.4 g/dL      AST (SGOT) 18 U/L      ALT 12 U/L      Alkaline Phosphatase 73 U/L      Bilirubin, Total 0.3 mg/dL      Globulin 3.9 g/dL      Albumin/Globulin Ratio 0.9     Anion Gap 5.0     eGFR 39.8 mL/min/1.73 m2     NT-proBNP [[id number]] Collected: 10/21/21 1858     Updated: 10/21/21 1952     NT-proBNP 391  pg/mL     Comprehensive metabolic panel [[id number]] Collected: 10/21/21 1858    Specimen: Blood Updated: 10/21/21 1900    COVID-19 (SARS-CoV-2) and Influenza A/B, NAA ([facility])- Admission [[id number]] Collected: 10/21/21 1754    Specimen: Culturette from Nasopharyngeal Updated: 10/21/21 1837     Purpose of COVID testing Diagnostic -[facility]     SARS-CoV-2 Specimen Source Nasal Swab     SARS CoV 2 Overall Result Not Detected     Influenza A Not Detected     Influenza B Not Detected    Narrative:      o Collect and clearly label specimen type:  o PREFERRED-Upper respiratory specimen: One Nasal Swab in  Transport Media.  o Hand deliver to laboratory ASAP  Diagnostic -[facility]              CRITICAL CARE/PROCEDURES    Procedures  ***Critical care?  DIAGNOSIS      Diagnosis:  Final diagnoses:   Hyponatremia   Weakness       Disposition:  ED Disposition       ED Disposition   Admit    Condition   --    Date/Time   Sun Oct 21, 2021  9:40 PM    Comment   Admitting Physician: Hebert Soho [[id number]]   Service:: Medicine [106]   Estimated Length of Stay: > or = to 2 midnights   Tentative Discharge Plan?: Home or Self Care [1]   Does patient need telemetry?: Yes   Is patient 18 yrs or greater?: Yes   Telemetry type (separate Telemetry order is also required):: Adult telemetry                  Prescriptions:  Current Discharge Medication List        CONTINUE these medications which have NOT CHANGED    Details   cloNIDine (CATAPRES) 0.2 MG tablet Take 0.5 tablets (0.1 mg) by mouth 3 (three) times daily      !! insulin lispro protamine-lispro 75-25 MIXTURE (HumaLOG MIX 75-25) (75-25) 100 UNIT/ML Suspension injection Inject 8 Units into the skin nightly      !! insulin lispro protamine-lispro 75-25 MIXTURE (HumaLOG MIX 75-25) (75-25) 100 UNIT/ML Suspension injection Inject 20 Units into the skin daily      losartan (COZAAR) 100 MG tablet Take 1 tablet (100 mg) by mouth daily Has not taken this week.      acetaminophen (TYLENOL) 650 MG CR tablet Take 1 tablet (650 mg) by mouth every 6 (six) hours as needed for Pain (Headache) Do not exceed 3250 mg in any given 24 hour time window.  Refills: 0      chlorthalidone 25 MG tablet Take 1 tablet (25 mg) by mouth daily      Cholecalciferol (VITAMIN D) 1000 UNIT tablet Take 1 tablet (1,000 Units) by mouth daily      dilTIAZem (CARDIZEM) 60 MG tablet Take 1 tablet (60 mg total) by mouth 4 (four) times daily  Qty: 60 tablet, Refills: 0      Diltiazem HCl ER Beads (TIAZAC PO) Take by mouth.      esomeprazole (NEXIUM) 40 MG capsule Take 1 capsule (40 mg) by mouth every morning before breakfast      ferrous sulfate 325 (65 FE) MG tablet Take 1 tablet (325 mg) by mouth every morning with breakfast      insulin aspart (NOVOLOG 70/30) (70-30) 100 UNIT/ML injection Inject into the skin 2 (two) times daily before meals.  insulin detemir (LEVEMIR) 100 UNIT/ML injection Inject into the skin.      !! lidocaine (LIDODERM) 5 % Place 1 patch onto the skin every 24 hours Remove & Discard patch within 12 hours or as directed by MD  Qty: 15 patch, Refills: 0      !! lidocaine (LIDODERM) 5 % Place 1 patch onto the skin every 24 hours Remove & Discard patch within 12 hours or as directed by MD  Qty: 6 patch, Refills: 0      metFORMIN (GLUCOPHAGE) 850 MG tablet Take 1 tablet (850  mg) by mouth 2 (two) times daily with meals      vitamin B-12 (CYANOCOBALAMIN) 500 MCG tablet Take 1 tablet (500 mcg) by mouth daily       !! - Potential duplicate medications found. Please discuss with provider.              This note was generated by the Epic EMR system/ Dragon speech recognition and may contain inherent errors or omissions not intended by the user. Grammatical errors, random word insertions, deletions and pronoun errors  are occasional consequences of this technology due to software limitations. Not all errors are caught or corrected. If there are questions or concerns about the content of this note or information contained within the body of this dictation they should be addressed directly with the author for clarification.    {THIS REVIEW SECTION WILL AUTODELETE ONCE NOTE IS SIGNED    END REVIEW SECTION(Optional):55325}

## 2021-10-22 ENCOUNTER — Inpatient Hospital Stay: Payer: No Typology Code available for payment source

## 2021-10-22 DIAGNOSIS — R42 Dizziness and giddiness: Secondary | ICD-10-CM

## 2021-10-22 DIAGNOSIS — E871 Hypo-osmolality and hyponatremia: Secondary | ICD-10-CM | POA: Diagnosis not present

## 2021-10-22 DIAGNOSIS — R2243 Localized swelling, mass and lump, lower limb, bilateral: Secondary | ICD-10-CM | POA: Diagnosis not present

## 2021-10-22 LAB — CBC
Absolute NRBC: 0 10*3/uL (ref 0.00–0.00)
Hematocrit: 34.6 % — ABNORMAL LOW (ref 34.7–43.7)
Hgb: 11.5 g/dL (ref 11.4–14.8)
MCH: 26.9 pg (ref 25.1–33.5)
MCHC: 33.2 g/dL (ref 31.5–35.8)
MCV: 80.8 fL (ref 78.0–96.0)
MPV: 10.4 fL (ref 8.9–12.5)
Nucleated RBC: 0 /100 WBC (ref 0.0–0.0)
Platelets: 408 10*3/uL — ABNORMAL HIGH (ref 142–346)
RBC: 4.28 10*6/uL (ref 3.90–5.10)
RDW: 18 % — ABNORMAL HIGH (ref 11–15)
WBC: 9.32 10*3/uL (ref 3.10–9.50)

## 2021-10-22 LAB — BASIC METABOLIC PANEL
Anion Gap: 13 (ref 5.0–15.0)
BUN: 36 mg/dL — ABNORMAL HIGH (ref 7.0–21.0)
CO2: 23 mEq/L (ref 17–29)
Calcium: 9 mg/dL (ref 7.9–10.2)
Chloride: 101 mEq/L (ref 99–111)
Creatinine: 1.2 mg/dL — ABNORMAL HIGH (ref 0.4–1.0)
Glucose: 195 mg/dL — ABNORMAL HIGH (ref 70–100)
Potassium: 4.1 mEq/L (ref 3.5–5.3)
Sodium: 137 mEq/L (ref 135–145)
eGFR: 43.8 mL/min/{1.73_m2} — AB (ref >=60)

## 2021-10-22 LAB — GLUCOSE WHOLE BLOOD - POCT
Whole Blood Glucose POCT: 150 mg/dL — ABNORMAL HIGH (ref 70–100)
Whole Blood Glucose POCT: 199 mg/dL — ABNORMAL HIGH (ref 70–100)
Whole Blood Glucose POCT: 256 mg/dL — ABNORMAL HIGH (ref 70–100)

## 2021-10-22 LAB — PHOSPHORUS: Phosphorus: 2.8 mg/dL (ref 2.3–4.7)

## 2021-10-22 LAB — MAGNESIUM: Magnesium: 1.9 mg/dL (ref 1.6–2.6)

## 2021-10-22 LAB — URINE POTASSIUM, RANDOM: Urine Potassium Random: 16.4 meq/L

## 2021-10-22 LAB — SODIUM
Sodium: 136 mEq/L (ref 135–145)
Sodium: 138 mEq/L (ref 135–145)

## 2021-10-22 LAB — TSH: TSH: 1.31 u[IU]/mL (ref 0.35–4.94)

## 2021-10-22 MED ORDER — POTASSIUM CHLORIDE CRYS ER 20 MEQ PO TBCR
40.0000 meq | EXTENDED_RELEASE_TABLET | Freq: Once | ORAL | Status: AC
Start: 2021-10-22 — End: 2021-10-22
  Administered 2021-10-22: 40 meq via ORAL
  Filled 2021-10-22: qty 2

## 2021-10-22 MED ORDER — RIVAROXABAN 15 MG PO TABS
15.0000 mg | ORAL_TABLET | Freq: Every day | ORAL | 0 refills | Status: DC
Start: 2021-10-22 — End: 2023-09-28

## 2021-10-22 MED ORDER — DILTIAZEM HCL ER COATED BEADS 120 MG PO CP24
120.0000 mg | ORAL_CAPSULE | Freq: Every day | ORAL | 0 refills | Status: AC
Start: 2021-10-23 — End: ?

## 2021-10-22 MED ORDER — DILTIAZEM HCL 60 MG PO TABS
120.0000 mg | ORAL_TABLET | Freq: Four times a day (QID) | ORAL | 0 refills | Status: DC
Start: 2021-10-22 — End: 2021-10-22

## 2021-10-22 MED ORDER — PANTOPRAZOLE SODIUM 20 MG PO TBEC: 20.0000 mg | DELAYED_RELEASE_TABLET | Freq: Every day | ORAL | 0 refills | Status: DC

## 2021-10-22 MED ORDER — INSULIN LISPRO 100 UNIT/ML SOLN (WRAP)
1.0000 [IU] | Freq: Three times a day (TID) | Status: DC
Start: 2021-10-22 — End: 2021-10-22
  Administered 2021-10-22: 1 [IU] via SUBCUTANEOUS
  Administered 2021-10-22: 5 [IU] via SUBCUTANEOUS
  Filled 2021-10-22: qty 3
  Filled 2021-10-22: qty 15

## 2021-10-22 MED ORDER — CLONIDINE HCL 0.1 MG PO TABS
0.1000 mg | ORAL_TABLET | Freq: Three times a day (TID) | ORAL | 0 refills | Status: AC
Start: 2021-10-22 — End: ?

## 2021-10-22 MED ORDER — SENNOSIDES-DOCUSATE SODIUM 8.6-50 MG PO TABS
1.0000 | ORAL_TABLET | Freq: Two times a day (BID) | ORAL | Status: DC
Start: 2021-10-22 — End: 2021-10-22
  Administered 2021-10-22: 1 via ORAL
  Filled 2021-10-22: qty 1

## 2021-10-22 MED ORDER — INSULIN LISPRO 100 UNIT/ML SOLN (WRAP)
1.0000 [IU] | Freq: Every evening | Status: DC
Start: 2021-10-22 — End: 2021-10-22

## 2021-10-22 MED ORDER — DEXTROSE 50 % IV SOLN
12.5000 g | INTRAVENOUS | Status: DC | PRN
Start: 2021-10-22 — End: 2021-10-22

## 2021-10-22 MED ORDER — GLUCOSE 40 % PO GEL (WRAP)
15.0000 g | ORAL | Status: DC | PRN
Start: 2021-10-22 — End: 2021-10-22

## 2021-10-22 MED ORDER — HYDRALAZINE HCL 20 MG/ML IJ SOLN
15.0000 mg | Freq: Four times a day (QID) | INTRAMUSCULAR | Status: DC | PRN
Start: 2021-10-22 — End: 2021-10-22

## 2021-10-22 MED ORDER — DEXTROSE 10 % IV BOLUS
12.5000 g | INTRAVENOUS | Status: DC | PRN
Start: 2021-10-22 — End: 2021-10-22

## 2021-10-22 MED ORDER — GLUCAGON 1 MG IJ SOLR (WRAP)
1.0000 mg | INTRAMUSCULAR | Status: DC | PRN
Start: 2021-10-22 — End: 2021-10-22

## 2021-10-22 NOTE — Progress Notes (Addendum)
[facility] Admission Notes:    Received patient @ 2245 from ED with C/C of gen fatigue and right hand swelling; diagnosis of hyponatremia. See flow sheet for vitals    Patient AAO X 4, denies any pain and discomfort.   Pt on room air SPO2 >98;  Clonidine and hydralazine given for BP    Skin assessment completed; skin intact

## 2021-10-22 NOTE — Progress Notes (Signed)
Repeat BMP with normalized sodium despite no fluids given. Serum and urine osmo being normal/ high does not seem to corroborate with overall picture of hyponatremia. Suspect previous Na of 113 a lab error. Will continue with q4 labs and monitoring for any neuro s/s

## 2021-10-22 NOTE — Plan of Care (Signed)
Pt denies any pain; AOx4  Pt's electrolytes replenished  Report given to 6B RN  Problem: Moderate/High Fall Risk Score >5  Goal: Patient will remain free of falls  Outcome: Progressing  Flowsheets (Taken 10/21/2021 2244)  Moderate Risk (6-13):   MOD-Re-orient confused patients   MOD-Place bedside commode and assistive devices out of sight when not in use   MOD-Remain with patient during toileting     Problem: Hemodynamic Status: Cardiac  Goal: Stable vital signs and fluid balance  Outcome: Progressing  Flowsheets (Taken 10/22/2021 0442)  Stable vital signs and fluid balance:   Monitor/assess vital signs and telemetry per unit protocol   Weigh on admission and record weight daily   Assess signs and symptoms associated with cardiac rhythm changes   Monitor intake/output per unit protocol and/or LIP order   Monitor for leg swelling/edema and report to LIP if abnormal   Monitor lab values     Problem: Inadequate Tissue Perfusion  Goal: Adequate tissue perfusion will be maintained  Outcome: Progressing  Flowsheets (Taken 10/22/2021 0442)  Adequate tissue perfusion will be maintained:   Monitor/assess vital signs   Monitor/assess lab values and report abnormal values   Monitor/assess neurovascular status (pulses, capillary refill, pain, paresthesia, paralysis, presence of edema)   Monitor/assess for signs of VTE (edema of calf/thigh redness, pain)   Monitor intake and output   Monitor for signs and symptoms of a pulmonary embolism (dyspnea, tachypnea, tachycardia, confusion)   VTE Prevention: Administer anticoagulant(s) and/or apply anti-embolism stockings/devices as ordered   Encourage/assist patient as needed to turn, cough, and perform deep breathing every 2 hours   Reinforce use of ordered respiratory interventions (i.e. CPAP, BiPAP, Incentive Spirometer, Acapella, etc.)   Perform active/passive ROM   Increase mobility as tolerated/progressive mobility   Position patient for maximum circulation/cardiac output   Elevate  feet   Assess and monitor skin integrity

## 2021-10-22 NOTE — UM Notes (Signed)
Place for Observation [facility] [[id number]]    Awaiting signature from: Holly Bodily, MD Status: Active   Mode: Ordering in Telephone with readback mode Communicated by: Gillian Scarce, RN   Ordering user: Gillian Scarce, RN 10/22/21 1007 Ordering provider: Holly Bodily, MD   Authorized by: Holly Bodily, MD    Add Signature Requirement   Frequency: Once 10/22/21 1008 - 1  occurrence     Downgraded to Observation status:    88 yo lady who presented to the ED with generalized weakness, abdominal pain.     The patient had a na of 113, which on repeat was normal. 138->136. Initial reading may have been an lab error. She has mild edema at the ankles, is ambulating at home. VS 74, 151/67, RR- 22 02 SAT 97%. Currently she denies any pain.     Trop was normal, UA was neg for infection, covid screen was negative cxr was negative. She chronic anemia and  CKD. Will order LE dopplers.     Agree with downgrade to the IMCU, continue hydration and electrolyte monitoring.       10/1 Medicine plan:  Plan      NEUROLOGICAL:   Delirium precautions including maintenance of day/night wake cycles, frequent reorientation as needed     CARDIOVASCULAR:   #Hypertension  #History of A-fib  #Hyperlipedemia  Clonidine 0.1mg  BID  Diltiazem ER 120 mg daily  Xarelto 15mg  daily  Blood pressure goal MAP >65 SBP <170     PULMONARY:   Supplemental oxygen as needed to keep sat > 92%  Currently on RA     RENAL:   #CKD 3  baseline crt 1.2-15  Avoid nephrotoxins     #Hyponatremia  Stop chlorothiadone  free water fluid restriction  Q4 sodium checks  Serum osmolality- 304?  Urine osmo- 312  Check triglycerides, urine lytes, urine Na     #Hypokalemia  Replacements as needed     GASTROINTESTINAL:  At risk for constipation  Nutrition: Cardiac with free water fluid restriction  Bowel Regimen: senna     INFECTIOUS DISEASE:   No active issues        HEMATOLOGY/ONCOLOGY:   No active issues     ENDOCRONOLOGY/RHEUMATOLOGY:   #Diabetes  Mellitus  Correctional insulin  Home regimin: 20 units lispro in AM 8 units in PM     SKIN  No active issues       10/2 CCU note: Repeat BMP with normalized sodium despite no fluids given. Serum and urine osmo being normal/ high does not seem to corroborate with overall picture of hyponatremia. Suspect previous Na of 113 a lab error. Will continue with q4 labs and monitoring for any neuro s/s           10/21/21 18:58 10/21/21 20:41 10/21/21 21:33 10/21/21 22:09 10/22/21 00:04 10/22/21 01:14 10/22/21 05:36   Hematocrit 34.5 (L)      34.6 (L)   Platelet Count 420 (H)      408 (H)   RDW 17 (H)      18 (H)   Lymphocytes Absolute Automated 3.35 (H)         Monocytes Absolute Automated 1.03 (H)         Glucose 156 (H)   161 (H)  215 (H) 195 (H)   Whole Blood Glucose POCT   162 (H)  150 (H)     BUN 42.0 (H)   39.0 (H)  37.0 (H) 36.0 (H)   Creatinine  1.3 (H)   1.3 (H)  1.3 (H) 1.2 (H)   eGFR 39.8 !   39.8 !  39.8 ! 43.8 !   Osmolality 304 (H)         Albumin 3.4 (L)         Globulin 3.9 (H)         Protein, UR  100 !                 Gillian Scarce, MSN, RN  [facility], Revenue Cycle Department, Utilization Review  Phone [phone]   Fax [phone]   [email or web address]

## 2021-10-22 NOTE — Progress Notes (Signed)
10/22/21 1509   CM Review   Case Management Assessment Status Assessment Complete   CM Comments 10/22/21. plan is dischare home with family

## 2021-10-22 NOTE — Nursing Progress Note (Signed)
Recvd report from CCU RN. Assumed care for pt at appox 0645. Pt A&Ox3, HOH. Belongings at bedside. Per pt, they want their belongings to stay at the bedside and denied having them sent to security or home with family. Pt refused two nurse skin assessment. Will continue to monitor.

## 2021-10-22 NOTE — Plan of Care (Signed)
Problem: Moderate/High Fall Risk Score >5  Goal: Patient will remain free of falls  Outcome: Adequate for Discharge     Problem: Hemodynamic Status: Cardiac  Goal: Stable vital signs and fluid balance  Outcome: Adequate for Discharge     Problem: Inadequate Tissue Perfusion  Goal: Adequate tissue perfusion will be maintained  Outcome: Adequate for Discharge

## 2021-10-22 NOTE — Progress Notes (Signed)
10/22/21 1506   Healthcare Decisions   Interviewed: Patient;Family   Interviewee Contact Information: Bethann Qualley, daughter   Prior to admission   Prior level of function Needs assistance with ADLs   Type of Residence Private residence   Living Arrangements Children   Discharge Planning   Support Systems Children   Anticipated [place] plan discussed with: Same as interviewed   Mode of transportation: Private car (family member)   Does the patient have perscription coverage? Yes   Consults/Providers   PT Evaluation Needed 1   OT Evalulation Needed 1   SLP Evaluation Needed 2     Anticipate discharge home with daughter. Daughter will transport.

## 2021-10-22 NOTE — Discharge Instr - AVS First Page (Signed)
Reason for your Hospital Admission:  Dizziness and weakness       Instructions for after your discharge:  If you have symptoms of chest pain shortness of breath or dizziness please return to the ED  Follow up with your PCP in 1 week

## 2021-10-22 NOTE — Progress Notes (Signed)
Reviewed d/c order with the pt and her daughter who verbalized understanding with no more questions or concerns. Pt' IVs and telemetry were removed and returned. Pt was stable and left the unit at about 1700 via WC. PT took with her all of her belongings with her

## 2021-10-22 NOTE — Discharge Summary (Signed)
MEDICINE DISCHARGE SUMMARY    Date Time: 10/22/21 2:32 PM  Patient Name: Emily Stevens  Attending Physician: Holly Bodily, MD  Primary Care Physician: Marisa Sprinkles, MD    Date of Admission: 10/21/2021  Date of Discharge: 10/22/2021    Discharge Diagnoses:     Principal Diagnosis (Diagnosis after study, that is chiefly responsible for admission): Dizziness   Active Hospital Problems    Diagnosis POA    Principal Problem: Hyponatremia Yes      Resolved Hospital Problems   No resolved problems to display.         Patient has BMI=Body mass index is 40.08 kg/m.  Diagnosis: Obesity based on BMI criteria       Recent Labs     10/22/21  1229 10/22/21  0923 10/22/21  0536 10/22/21  0114 10/21/21  2209 10/21/21  1858   Sodium 138 136 137 136 138 113*     Diagnosis: Erroneous lab result       Recent Labs     10/22/21  0536 10/22/21  0114 10/21/21  2209 10/21/21  1858   Potassium 4.1 3.6 4.0 3.1*     Diagnosis: Erroneous lab result         Disposition:    Disposition: home with family    Pending Results, Recommendations & Instructions to providers after discharge:     Micro / Labs / Path pending:   Unresulted Labs       None          Other: Follow up with PCP   Code Status:Full     Procedures/Radiology performed:   Radiology: all results from this admission  US Venous Duplex Doppler Leg Bilateral    Result Date: 10/22/2021     Normal venous duplex of the lower extremities. No evidence of intraluminal thrombus or obstruction to venous flow. Kinnie Feil, MD 10/22/2021 9:26 AM    Hand Right PA Lateral and Oblique    Result Date: 10/21/2021  No acute osseous abnormality. Rozann Lesches, MD 10/21/2021 6:35 PM    Chest AP Portable    Result Date: 10/21/2021  No acute cardiopulmonary disease. Rozann Lesches, MD 10/21/2021 4:20 PM   Surgery: all results from this admission  * No surgery found [facility] Course:     Reason for admission/ HPI: Weakness and dizziness      Hospital Course:      Emily Stevens is a 88 y.o. female with PMH of HTN, HLD,  DM 2,  and diabetic nephropathy with baseline crt of 1.5 who presented to the ER 10/1 with vague complaints of generalized weakness. Unknown when this began. In ER labs significant for sodium of 113. Nephrologist MD Sherryll Burger was contacted and plan for fluid restriction and 500 ml NS bolus made since patient was asymptomatic. Patient was initially admitted to the ICU for management of severe hyponatremia. However subsequent labs showed that her Na levels were normal, at 137 > 136 > 138.   The patient was deemed to have normal sodium levels and the original level was a lab error.   The patients symptoms of weakness improved. VS remained stable. Labs showed hyperglycemia, Cr 1.2 which is her baseline Cr.   The patient underwent a LE doppler to R/O DVT which was negative. EKG showed Sinus rhythm with 1st degree block unchanged from prior. Chlorthalidone was held.   The patient was back at her usual state of health and was discharged home.  Discharge condition: stable    Discharge Day Exam:  Today:  BP 138/75   Pulse 75   Temp 97.9 F (36.6 C) (Oral)   Resp 20   Ht 1.549 m (5\' 1" )   Wt 96.2 kg (212 lb 1.6 oz)   SpO2 96%   BMI 40.08 kg/m   Ranges for the last 24 hours:  Temp:  [97.6 F (36.4 C)-98.6 F (37 C)] 97.9 F (36.6 C)  Heart Rate:  [67-82] 75  Resp Rate:  [14-32] 20  BP: (138-216)/(62-102) 138/75  Body mass index is 40.08 kg/m.      Intake/Output Summary (Last 24 hours) at 10/22/2021 1432  Last data filed at 10/22/2021 0600  Gross per 24 hour   Intake --   Output 500 ml   Net -500 ml    General: awake, alert in no acute distress  Cardiovascular: regular rate and rhythm, no murmurs, rubs or gallops  Lungs: clear to auscultation bilaterally, no additional sounds  Abdomen: soft, non-tender, non-distended; normoactive bowel sounds  Extremities: no edema  Neurological: Alert and oriented X 3, moves all extremities.          Wounds/decutibus ulcers/stage:    Consultations:   Treatment Team:   Attending  Provider: Holly Bodily, MD  Recent Labs - Last 2:       Recent Labs   Lab 10/22/21  0536 10/21/21  1858   WBC 9.32 9.98*   Hgb 11.5 11.4   Hematocrit 34.6* 34.5*   Platelets 408* 420*         Recent Labs   Lab 10/22/21  1229 10/22/21  0923 10/22/21  0536 10/22/21  0114 10/21/21  2209 10/21/21  1858   Sodium 138 136 137 136 138 113*   Potassium  --   --  4.1 3.6 4.0 3.1*   Chloride  --   --  101 102 101 84*   CO2  --   --  23 24 26 24    BUN  --   --  36.0* 37.0* 39.0* 42.0*   Creatinine  --   --  1.2* 1.3* 1.3* 1.3*   eGFR  --   --  43.8* 39.8* 39.8* 39.8*   Glucose  --   --  195* 215* 161* 156*   Calcium  --   --  9.0 9.1 9.4 9.2     Recent Labs   Lab 10/21/21  1858   Alkaline Phosphatase 73   Bilirubin, Total 0.3   Protein, Total 7.3   Albumin 3.4*   ALT 12   AST (SGOT) 18      Recent Labs   Lab 10/21/21  2209   TSH 1.31     Recent Labs   Lab 10/22/21  0536   Triglycerides 50        Microbiology Results (last 15 days)       Procedure Component Value Units Date/Time    COVID-19 (SARS-CoV-2) and Influenza A/B, NAA (Liat Rapid)- Admission [[id number]] Collected: 10/21/21 1754    Order Status: Completed Specimen: Culturette from Nasopharyngeal Updated: 10/21/21 1837     Purpose of COVID testing Diagnostic -PUI     SARS-CoV-2 Specimen Source Nasal Swab     SARS CoV 2 Overall Result Not Detected     Comment: __________________________________________________  -A result of "Detected" indicates POSITIVE for the    presence of SARS CoV-2 RNA  -A result of "Not Detected" indicates NEGATIVE for the    presence of SARS  CoV-2 RNA  __________________________________________________________  Test performed using the Roche cobas Liat SARS-CoV-2 assay. This assay is  only for use under the Food and Drug Administration's Emergency Use  Authorization. This is a real-time RT-PCR assay for the qualitative  detection of SARS-CoV-2 RNA. Viral nucleic acids may persist in vivo,  independent of viability. Detection of viral nucleic acid  does not imply the  presence of infectious virus, or that virus nucleic acid is the cause of  clinical symptoms. Negative results do not preclude SARS-CoV-2 infection and  should not be used as the sole basis for diagnosis, treatment or other  patient management decisions. Negative results must be combined with  clinical observations, patient history, and/or epidemiological information.  Invalid results may be due to inhibiting substances in the specimen and  recollection should occur. Please see Fact Sheets for patients and providers  located:  WirelessDSLBlog.no          Influenza A Not Detected     Influenza B Not Detected     Comment: Test performed using the Roche cobas Liat SARS-CoV-2 & Influenza A/B assay.  This assay is only for use under the Food and Drug Administration's  Emergency Use Authorization. This is a multiplex real-time RT-PCR assay  intended for the simultaneous in vitro qualitative detection and  differentiation of SARS-CoV-2, influenza A, and influenza B virus RNA. Viral  nucleic acids may persist in vivo, independent of viability. Detection of  viral nucleic acid does not imply the presence of infectious virus, or that  virus nucleic acid is the cause of clinical symptoms. Negative results do  not preclude SARS-CoV-2, influenza A, and/or influenza B infection and  should not be used as the sole basis for diagnosis, treatment or other  patient management decisions. Negative results must be combined with  clinical observations, patient history, and/or epidemiological information.  Invalid results may be due to inhibiting substances in the specimen and  recollection should occur. Please see Fact Sheets for patients and providers  located: http://www.rice.biz/.         Narrative:      o Collect and clearly label specimen type:  o PREFERRED-Upper respiratory specimen: One Nasal Swab in  Transport Media.  o Hand deliver to laboratory ASAP  Diagnostic  -PUI            Discharge Instructions & Follow Up Plan for Patient:   Discharge Diet: Diet consistent carbohydrate Fluid restriction: 1000 ML FLUID  Supervise For Meals Frequency: All meals  Activity/Weight Bearing Status: as tolerated   Patient was instructed to follow up with:      Follow-up Information       Pcp, None, MD Follow up in 1 week(s).                             Complete instructions and follow up are in the patient's After Visit Summary    Minutes spent coordinating discharge and reviewing discharge plan: 30 minutes    Discharge Medications:        Medication List        START taking these medications      rivaroxaban 15 MG Tabs  Commonly known as: XARELTO  Take 1 tablet (15 mg) by mouth daily with dinner            CONTINUE taking these medications      acetaminophen 650 MG CR tablet  Commonly known as: TYLENOL  Take 1  tablet (650 mg) by mouth every 6 (six) hours as needed for Pain (Headache) Do not exceed 3250 mg in any given 24 hour time window.     cloNIDine 0.2 MG tablet  Commonly known as: CATAPRES     dilTIAZem 60 MG tablet  Commonly known as: CARDIZEM  Take 2 tablets (120 mg) by mouth 4 (four) times daily     esomeprazole 40 MG capsule  Commonly known as: NexIUM     ferrous sulfate 325 (65 FE) MG tablet     * insulin lispro protamine-lispro 75-25 MIXTURE (75-25) 100 UNIT/ML Susp injection  Commonly known as: HumaLOG MIX 75-25     * insulin lispro protamine-lispro 75-25 MIXTURE (75-25) 100 UNIT/ML Susp injection  Commonly known as: HumaLOG MIX 75-25     losartan 100 MG tablet  Commonly known as: COZAAR     metFORMIN 850 MG tablet  Commonly known as: GLUCOPHAGE     vitamin B-12 500 MCG tablet  Commonly known as: CYANOCOBALAMIN     vitamin D 1000 UNIT tablet  Commonly known as: cholecalciferol           * This list has 2 medication(s) that are the same as other medications prescribed for you. Read the directions carefully, and ask your doctor or other care provider to review them with you.                 STOP taking these medications      chlorthalidone 25 MG tablet     insulin aspart protamine-aspart 70-30 MIXTURE (70-30) 100 UNIT/ML injection  Commonly known as: NovoLOG MIX 70/30     insulin detemir 100 UNIT/ML injection  Commonly known as: LEVEMIR     lidocaine 5 %  Commonly known as: LIDODERM     TIAZAC PO               Where to Get Your Medications        These medications [facility] to [id number] [place] - [id number]  [address] [place] [id number]      Phone: [phone]   rivaroxaban 15 MG Tabs       You can get these medications from any pharmacy    Bring a paper prescription for each of these medications  dilTIAZem 60 MG tablet         Immunizations provided:   Immunization History   Administered Date(s) Administered    COVID-19 mRNA MONOVALENT vaccine PRIMARY SERIES 12 years and above (Moderna) 100 mcg/0.5 mL 04/01/2019       This note was generated by the Epic EMR system/ Dragon speech recognition and may contain inherent errors or omissions not intended by the user. Grammatical errors, random word insertions, deletions and pronoun errors  are occasional consequences of this technology due to software limitations. Not all errors are caught or corrected. If there are questions or concerns about the content of this note or information contained within the body of this dictation they should be addressed directly with the author for clarification.    Signed by: Holly Bodilyaeesa W Susanne Baumgarner, MD, MD  [facility]    Hospitalist Division  Department of Medicine  CC: Pcp, None, MD

## 2021-10-25 ENCOUNTER — Telehealth: Payer: Self-pay

## 2021-10-25 NOTE — Patient Outreach (Signed)
  Care Coordination   10/25/2021 Name: [name] MRN: [id number] DOB: 17-Jul-1932   Care Coordination Outreach Attempts:  A second unsuccessful outreach was attempted today to offer the patient with information about available care coordination services as a benefit of their health plan.     Follow Up Plan:  Additional outreach attempts will be made to offer the patient care coordination information and services.   Encounter Outcome:  No Answer  Care Coordination Interventions Activated:  No   Care Coordination Interventions:  No, not indicated    [facility] [phone]

## 2021-10-31 ENCOUNTER — Telehealth: Payer: Self-pay

## 2021-10-31 NOTE — Patient Outreach (Signed)
  Care Coordination   10/31/2021 Name: [name] MRN: [id number] DOB: 1933-01-08   Care Coordination Outreach Attempts:  A third unsuccessful outreach was attempted today to offer the patient with information about available care coordination services as a benefit of their health plan.   Follow Up Plan:  No further outreach attempts will be made at this time. We have been unable to contact the patient to offer or enroll patient in care coordination services  Encounter Outcome:  No Answer  Care Coordination Interventions Activated:  No   Care Coordination Interventions:  No, not indicated    [facility] [phone]

## 2021-11-01 DIAGNOSIS — D631 Anemia in chronic kidney disease: Secondary | ICD-10-CM | POA: Diagnosis not present

## 2021-11-01 DIAGNOSIS — E1165 Type 2 diabetes mellitus with hyperglycemia: Secondary | ICD-10-CM | POA: Diagnosis not present

## 2021-11-01 DIAGNOSIS — R413 Other amnesia: Secondary | ICD-10-CM | POA: Diagnosis not present

## 2021-11-01 DIAGNOSIS — N189 Chronic kidney disease, unspecified: Secondary | ICD-10-CM | POA: Diagnosis not present

## 2021-11-01 DIAGNOSIS — N2581 Secondary hyperparathyroidism of renal origin: Secondary | ICD-10-CM | POA: Diagnosis not present

## 2021-11-01 DIAGNOSIS — I10 Essential (primary) hypertension: Secondary | ICD-10-CM | POA: Diagnosis not present

## 2021-11-01 DIAGNOSIS — N183 Chronic kidney disease, stage 3 unspecified: Secondary | ICD-10-CM | POA: Diagnosis not present

## 2021-11-26 DIAGNOSIS — E030 Congenital hypothyroidism with diffuse goiter: Secondary | ICD-10-CM | POA: Diagnosis not present

## 2021-11-26 DIAGNOSIS — E890 Postprocedural hypothyroidism: Secondary | ICD-10-CM | POA: Diagnosis not present

## 2021-11-29 DIAGNOSIS — N183 Chronic kidney disease, stage 3 unspecified: Secondary | ICD-10-CM | POA: Diagnosis not present

## 2021-11-29 DIAGNOSIS — I10 Essential (primary) hypertension: Secondary | ICD-10-CM | POA: Diagnosis not present

## 2021-11-29 DIAGNOSIS — D631 Anemia in chronic kidney disease: Secondary | ICD-10-CM | POA: Diagnosis not present

## 2021-11-29 DIAGNOSIS — N2581 Secondary hyperparathyroidism of renal origin: Secondary | ICD-10-CM | POA: Diagnosis not present

## 2021-12-22 DIAGNOSIS — H6123 Impacted cerumen, bilateral: Secondary | ICD-10-CM | POA: Diagnosis not present

## 2021-12-22 DIAGNOSIS — E119 Type 2 diabetes mellitus without complications: Secondary | ICD-10-CM | POA: Diagnosis not present

## 2021-12-22 DIAGNOSIS — Z794 Long term (current) use of insulin: Secondary | ICD-10-CM | POA: Diagnosis not present

## 2021-12-26 DIAGNOSIS — N2581 Secondary hyperparathyroidism of renal origin: Secondary | ICD-10-CM | POA: Diagnosis not present

## 2021-12-26 DIAGNOSIS — I4891 Unspecified atrial fibrillation: Secondary | ICD-10-CM | POA: Diagnosis not present

## 2021-12-26 DIAGNOSIS — I10 Essential (primary) hypertension: Secondary | ICD-10-CM | POA: Diagnosis not present

## 2021-12-26 DIAGNOSIS — D631 Anemia in chronic kidney disease: Secondary | ICD-10-CM | POA: Diagnosis not present

## 2021-12-26 DIAGNOSIS — E1165 Type 2 diabetes mellitus with hyperglycemia: Secondary | ICD-10-CM | POA: Diagnosis not present

## 2021-12-31 DIAGNOSIS — N183 Chronic kidney disease, stage 3 unspecified: Secondary | ICD-10-CM | POA: Diagnosis not present

## 2021-12-31 DIAGNOSIS — D631 Anemia in chronic kidney disease: Secondary | ICD-10-CM | POA: Diagnosis not present

## 2021-12-31 DIAGNOSIS — N2581 Secondary hyperparathyroidism of renal origin: Secondary | ICD-10-CM | POA: Diagnosis not present

## 2021-12-31 DIAGNOSIS — I10 Essential (primary) hypertension: Secondary | ICD-10-CM | POA: Diagnosis not present

## 2022-01-07 DIAGNOSIS — R0600 Dyspnea, unspecified: Secondary | ICD-10-CM | POA: Diagnosis not present

## 2022-01-07 DIAGNOSIS — I480 Paroxysmal atrial fibrillation: Secondary | ICD-10-CM | POA: Diagnosis not present

## 2022-01-07 DIAGNOSIS — E785 Hyperlipidemia, unspecified: Secondary | ICD-10-CM | POA: Diagnosis not present

## 2022-01-07 DIAGNOSIS — I1310 Hypertensive heart and chronic kidney disease without heart failure, with stage 1 through stage 4 chronic kidney disease, or unspecified chronic kidney disease: Secondary | ICD-10-CM | POA: Diagnosis not present

## 2022-01-10 DIAGNOSIS — I10 Essential (primary) hypertension: Secondary | ICD-10-CM | POA: Diagnosis not present

## 2022-01-10 DIAGNOSIS — E1165 Type 2 diabetes mellitus with hyperglycemia: Secondary | ICD-10-CM | POA: Diagnosis not present

## 2022-01-10 DIAGNOSIS — N189 Chronic kidney disease, unspecified: Secondary | ICD-10-CM | POA: Diagnosis not present

## 2022-03-28 DIAGNOSIS — E1165 Type 2 diabetes mellitus with hyperglycemia: Secondary | ICD-10-CM | POA: Diagnosis not present

## 2022-03-28 DIAGNOSIS — N2581 Secondary hyperparathyroidism of renal origin: Secondary | ICD-10-CM | POA: Diagnosis not present

## 2022-03-28 DIAGNOSIS — N183 Chronic kidney disease, stage 3 unspecified: Secondary | ICD-10-CM | POA: Diagnosis not present

## 2022-03-28 DIAGNOSIS — D631 Anemia in chronic kidney disease: Secondary | ICD-10-CM | POA: Diagnosis not present

## 2022-03-28 DIAGNOSIS — I10 Essential (primary) hypertension: Secondary | ICD-10-CM | POA: Diagnosis not present

## 2022-04-01 DIAGNOSIS — D631 Anemia in chronic kidney disease: Secondary | ICD-10-CM | POA: Diagnosis not present

## 2022-04-01 DIAGNOSIS — I10 Essential (primary) hypertension: Secondary | ICD-10-CM | POA: Diagnosis not present

## 2022-04-01 DIAGNOSIS — N2581 Secondary hyperparathyroidism of renal origin: Secondary | ICD-10-CM | POA: Diagnosis not present

## 2022-04-01 DIAGNOSIS — N183 Chronic kidney disease, stage 3 unspecified: Secondary | ICD-10-CM | POA: Diagnosis not present

## 2022-05-13 DIAGNOSIS — I10 Essential (primary) hypertension: Secondary | ICD-10-CM | POA: Diagnosis not present

## 2022-05-13 DIAGNOSIS — R809 Proteinuria, unspecified: Secondary | ICD-10-CM | POA: Diagnosis not present

## 2022-05-13 DIAGNOSIS — N189 Chronic kidney disease, unspecified: Secondary | ICD-10-CM | POA: Diagnosis not present

## 2022-05-13 DIAGNOSIS — E1165 Type 2 diabetes mellitus with hyperglycemia: Secondary | ICD-10-CM | POA: Diagnosis not present

## 2022-05-30 DIAGNOSIS — Z8679 Personal history of other diseases of the circulatory system: Secondary | ICD-10-CM | POA: Diagnosis not present

## 2022-05-30 DIAGNOSIS — R0789 Other chest pain: Secondary | ICD-10-CM | POA: Diagnosis not present

## 2022-05-30 DIAGNOSIS — I10 Essential (primary) hypertension: Secondary | ICD-10-CM | POA: Diagnosis not present

## 2022-05-30 DIAGNOSIS — E782 Mixed hyperlipidemia: Secondary | ICD-10-CM | POA: Diagnosis not present

## 2022-06-03 ENCOUNTER — Emergency Department (HOSPITAL_COMMUNITY): Payer: Medicare Other

## 2022-06-03 ENCOUNTER — Encounter (HOSPITAL_COMMUNITY): Payer: Self-pay

## 2022-06-03 ENCOUNTER — Emergency Department (HOSPITAL_COMMUNITY)
Admission: EM | Admit: 2022-06-03 | Discharge: 2022-06-04 | Disposition: A | Discharge status: Home / Self Care | Payer: Medicare Other | Attending: Emergency Medicine | Admitting: Emergency Medicine

## 2022-06-03 ENCOUNTER — Other Ambulatory Visit: Payer: Self-pay

## 2022-06-03 DIAGNOSIS — I10 Essential (primary) hypertension: Secondary | ICD-10-CM | POA: Diagnosis not present

## 2022-06-03 DIAGNOSIS — Z7901 Long term (current) use of anticoagulants: Secondary | ICD-10-CM | POA: Diagnosis not present

## 2022-06-03 DIAGNOSIS — R0789 Other chest pain: Secondary | ICD-10-CM | POA: Diagnosis not present

## 2022-06-03 DIAGNOSIS — Z794 Long term (current) use of insulin: Secondary | ICD-10-CM | POA: Insufficient documentation

## 2022-06-03 DIAGNOSIS — E119 Type 2 diabetes mellitus without complications: Secondary | ICD-10-CM | POA: Insufficient documentation

## 2022-06-03 DIAGNOSIS — R079 Chest pain, unspecified: Secondary | ICD-10-CM

## 2022-06-03 LAB — BASIC METABOLIC PANEL
Anion gap: 11 (ref 5–15)
BUN: 31 mg/dL — ABNORMAL HIGH (ref 8–23)
CO2: 23 mmol/L (ref 22–32)
Calcium: 9.4 mg/dL (ref 8.9–10.3)
Chloride: 101 mmol/L (ref 98–111)
Creatinine, Ser: 1.59 mg/dL — ABNORMAL HIGH (ref 0.44–1.00)
GFR, Estimated: 31 mL/min — ABNORMAL LOW (ref >60)
Glucose, Bld: 179 mg/dL — ABNORMAL HIGH (ref 70–99)
Potassium: 6 mmol/L — ABNORMAL HIGH (ref 3.5–5.1)
Sodium: 135 mmol/L (ref 135–145)

## 2022-06-03 LAB — CBC
HCT: 37.8 % (ref 36.0–46.0)
Hemoglobin: 12.4 g/dL (ref 12.0–15.0)
MCH: 26.2 pg (ref 26.0–34.0)
MCHC: 32.8 g/dL (ref 30.0–36.0)
MCV: 79.7 fL — ABNORMAL LOW (ref 80.0–100.0)
Platelets: 487 10*3/uL — ABNORMAL HIGH (ref 150–400)
RBC: 4.74 MIL/uL (ref 3.87–5.11)
RDW: 19.1 % — ABNORMAL HIGH (ref 11.5–15.5)
WBC: 11 10*3/uL — ABNORMAL HIGH (ref 4.0–10.5)
nRBC: 0 % (ref 0.0–0.2)

## 2022-06-03 LAB — TROPONIN I (HIGH SENSITIVITY): Troponin I (High Sensitivity): 11 ng/L (ref <18)

## 2022-06-03 NOTE — ED Provider Notes (Signed)
[place] [facility]ERG[facility] DEPARTMENT AT [facility] Provider Note   CSN: [id number] Arrival date & time: 06/03/22  2059     History {Add pertinent medical, surgical, social history, OB history to HPI:1} Chief Complaint  Patient presents with   Chest Pain    [name] is a 89 y.o. female.   Chest Pain    Patient presents to the ED for evaluation of chest pain.  Patient has a history of diabetes, thrombocytosis, hypertension, reflux, stroke, bowel obstruction, A-fib who presents to the ED with complaints of chest pain.  Patient had an episode of left-sided chest pain associated with nausea and diaphoresis that occurred 45 minutes prior to arrival.  Patient states she took a Tums and the symptoms have improved.  She denies any history of heart disease.  Denies any history.  Home Medications Prior to Admission medications   Medication Sig Start Date End Date Taking? Authorizing Provider  acetaminophen (TYLENOL) 325 MG tablet Take 2 tablets (650 mg total) by mouth every 6 (six) hours as needed for mild pain or fever (temp > 100.3). 01/12/18   Angiulli, Mcarthur Rossetti, PA-C  Cholecalciferol (VITAMIN D3 PO) Take 1 tablet by mouth daily with lunch.    [provider]  cloNIDine (CATAPRES) 0.2 MG tablet Take 1 tablet (0.2 mg total) by mouth 2 (two) times daily. 01/12/18   Angiulli, Mcarthur Rossetti, PA-C  diltiazem (CARDIZEM) 30 MG tablet Take 1 tablet (30 mg total) by mouth every 8 (eight) hours. 01/12/18   Angiulli, Mcarthur Rossetti, PA-C  Insulin Detemir (LEVEMIR) 100 UNIT/ML Pen Inject 16 Units into the skin 2 (two) times daily. 01/12/18   Angiulli, Mcarthur Rossetti, PA-C  IRON PO Take 325 mg by mouth daily with lunch.     [provider]  levETIRAcetam (KEPPRA) 500 MG tablet Take 1 tablet (500 mg total) by mouth 2 (two) times daily. 07/06/18   Penumalli, Glenford Bayley, MD  Multiple Vitamin (MULTIVITAMIN WITH MINERALS) TABS tablet Take 1 tablet by mouth daily.    [provider]   mupirocin ointment (BACTROBAN) 2 % Apply 1 application topically 2 (two) times daily. 10/14/18   Wieters, Hallie C, PA-C  nystatin cream (MYCOSTATIN) Apply to affected area 2 times daily 10/14/18   Wieters, Hallie C, PA-C  pantoprazole (PROTONIX) 40 MG tablet Take 1 tablet (40 mg total) by mouth at bedtime. 01/12/18   Angiulli, Mcarthur Rossetti, PA-C  polycarbophil (FIBERCON) 625 MG tablet Take 625 mg by mouth daily as needed for mild constipation.    [provider]  polyethylene glycol (MIRALAX / GLYCOLAX) packet Take 17 g by mouth daily. 01/10/18   Elgergawy, Leana Roe, MD  Rivaroxaban (XARELTO) 15 MG TABS tablet Take 1 tablet (15 mg total) by mouth daily with supper. 01/12/18   Angiulli, Mcarthur Rossetti, PA-C  senna-docusate (SENOKOT-S) 8.6-50 MG tablet Take 2 tablets by mouth 2 (two) times daily. 01/09/18   Elgergawy, Leana Roe, MD  vitamin B-12 (CYANOCOBALAMIN) 500 MCG tablet Take 1 tablet (500 mcg total) by mouth daily with lunch. 01/12/18   Angiulli, Mcarthur Rossetti, PA-C      Allergies    Ace inhibitors, Prednisone, Sulfa antibiotics, Penicillins, Sulfamethoxazole, and Sulfonamide derivatives    Review of Systems   Review of Systems  Cardiovascular:  Positive for chest pain.    Physical Exam Updated Vital Signs BP (!) 155/66   Pulse 80   Temp 98.5 F (36.9 C) (Oral)   Resp 18   Ht 1.575 m (5'  2")   Wt 95.3 kg   SpO2 96%   BMI 38.41 kg/m  Physical Exam Vitals and nursing note reviewed.  Constitutional:      General: She is not in acute distress.    Appearance: She is well-developed.  HENT:     Head: Normocephalic and atraumatic.     Right Ear: External ear normal.     Left Ear: External ear normal.  Eyes:     General: No scleral icterus.       Right eye: No discharge.        Left eye: No discharge.     Conjunctiva/sclera: Conjunctivae normal.  Neck:     Trachea: No tracheal deviation.  Cardiovascular:     Rate and Rhythm: Normal rate and regular rhythm.  Pulmonary:      Effort: Pulmonary effort is normal. No respiratory distress.     Breath sounds: Normal breath sounds. No stridor. No wheezing or rales.  Abdominal:     General: Bowel sounds are normal. There is no distension.     Palpations: Abdomen is soft.     Tenderness: There is no abdominal tenderness. There is no guarding or rebound.  Musculoskeletal:        General: No tenderness or deformity.     Cervical back: Neck supple.  Skin:    General: Skin is warm and dry.     Findings: No rash.  Neurological:     General: No focal deficit present.     Mental Status: She is alert.     Cranial Nerves: No cranial nerve deficit, dysarthria or facial asymmetry.     Sensory: No sensory deficit.     Motor: No abnormal muscle tone or seizure activity.     Coordination: Coordination normal.  Psychiatric:        Mood and Affect: Mood normal.     ED Results / Procedures / Treatments   Labs (all labs ordered are listed, but only abnormal results are displayed) Labs Reviewed  BASIC METABOLIC PANEL  CBC  TROPONIN I (HIGH SENSITIVITY)    EKG None  Radiology No results found.  Procedures Procedures  {Document cardiac monitor, telemetry assessment procedure when appropriate:1}  Medications Ordered in ED Medications - No data to display  ED Course/ Medical Decision Making/ A&P   {   Click here for ABCD2, HEART and other calculatorsREFRESH Note before signing :1}                          Medical Decision Making Amount and/or Complexity of Data Reviewed Labs: ordered. Radiology: ordered.   ***  {Document critical care time when appropriate:1} {Document review of labs and clinical decision tools ie heart score, Chads2Vasc2 etc:1}  {Document your independent review of radiology images, and any outside records:1} {Document your discussion with family members, caretakers, and with consultants:1} {Document social determinants of health affecting pt's care:1} {Document your decision making why  or why not admission, treatments were needed:1} Final Clinical Impression(s) / ED Diagnoses Final diagnoses:  None    Rx / DC Orders ED Discharge Orders     None

## 2022-06-03 NOTE — ED Triage Notes (Signed)
Pt reports left chest pain that started ago.

## 2022-06-04 LAB — POTASSIUM: Potassium: 4 mmol/L (ref 3.5–5.1)

## 2022-06-04 LAB — TROPONIN I (HIGH SENSITIVITY): Troponin I (High Sensitivity): 13 ng/L (ref <18)

## 2022-06-04 MED ORDER — CLONIDINE HCL 0.1 MG PO TABS: 0.1000 mg | ORAL_TABLET | Freq: Once | ORAL | Status: DC

## 2022-06-04 MED ORDER — DILTIAZEM HCL 30 MG PO TABS
30.0000 mg | ORAL_TABLET | Freq: Once | ORAL | Status: AC
  Administered 2022-06-04: 30 mg via ORAL
  Filled 2022-06-04: qty 1

## 2022-06-04 MED ORDER — CLONIDINE HCL 0.1 MG PO TABS
0.2000 mg | ORAL_TABLET | Freq: Once | ORAL | Status: AC
  Administered 2022-06-04: 0.2 mg via ORAL
  Filled 2022-06-04: qty 2

## 2022-06-04 NOTE — Discharge Instructions (Signed)
You were seen in the emergency department today for chest pain. You workup did not reveal a definite cause of your symptoms but was generally reassuring.   Return to the emergency department immediately if you develop recurrent, severe chest pain, shortness of breath, fainting spells, sudden sweatiness, or any other concerning symptoms.   Please also make an appointment to follow up with your primary care doctor or cardiologist within one week to assure improvement or resolution in symptoms. Further testing may be necessary, so it is extremely important to keep your follow-up appointment with your primary doctor.   

## 2022-06-04 NOTE — ED Provider Notes (Signed)
  Physical Exam  BP (!) 155/73   Pulse 89   Temp 98.5 F (36.9 C)   Resp (!) 22   Ht 5\' 2"  (1.575 m)   Wt 95.3 kg   SpO2 97%   BMI 38.41 kg/m   Physical Exam Constitutional:      Appearance: She is well-developed.     Comments: NAD, nontoxic  Cardiovascular:     Rate and Rhythm: Normal rate.  Pulmonary:     Effort: Pulmonary effort is normal.  Neurological:     Mental Status: She is alert.     Procedures  Procedures  ED Course / MDM   Clinical Course as of 06/04/22 0230  Tue Jun 04, 2022  0003 Abdominal panel shows hyperkalemia at 6.  CBC is normal.  Hemolysis noted on the metabolic panel.  Initial troponin is normal.  Will plan on repeat troponin and potassium level [JK]  0024 89 F  A. fib, hypertension, DM type II, previous CVA, CKD presents with CP left sided no longer present after TUMS. EKG with known LBBB, no acute changes, no Sgarbossa. Follow up trop/K repeat. Follow up with Harwani if negative. BP control. [VB]  0229 Patient's repeat troponin 13 and reassuring.  Potassium 4.  Patient reassessed she is well-appearing in no acute distress.  She denies any further chest pain.  She asked me when can I go home.  Her blood pressure improved with her home BP meds.  Discussed close follow-up with her cardiologist Dr. Sharyn Lull and strict return precautions.  Patient and patient's family member at bedside in agreement with plan discharged in good condition. [VB]    Clinical Course User Index [JK] Linwood Dibbles, MD [VB] Mardene Sayer, MD   Medical Decision Making Amount and/or Complexity of Data Reviewed Labs: ordered. Radiology: ordered.  Risk Prescription drug management.          Mardene Sayer, MD 06/04/22 0230

## 2022-06-05 DIAGNOSIS — R0789 Other chest pain: Secondary | ICD-10-CM | POA: Diagnosis not present

## 2022-06-05 DIAGNOSIS — Z8679 Personal history of other diseases of the circulatory system: Secondary | ICD-10-CM | POA: Diagnosis not present

## 2022-06-05 DIAGNOSIS — E1122 Type 2 diabetes mellitus with diabetic chronic kidney disease: Secondary | ICD-10-CM | POA: Diagnosis not present

## 2022-06-05 DIAGNOSIS — I10 Essential (primary) hypertension: Secondary | ICD-10-CM | POA: Diagnosis not present

## 2022-06-14 DIAGNOSIS — Z794 Long term (current) use of insulin: Secondary | ICD-10-CM | POA: Diagnosis not present

## 2022-06-14 DIAGNOSIS — I10 Essential (primary) hypertension: Secondary | ICD-10-CM | POA: Diagnosis not present

## 2022-06-14 DIAGNOSIS — E119 Type 2 diabetes mellitus without complications: Secondary | ICD-10-CM | POA: Diagnosis not present

## 2022-08-09 DIAGNOSIS — R350 Frequency of micturition: Secondary | ICD-10-CM | POA: Diagnosis not present

## 2022-08-16 DIAGNOSIS — N3000 Acute cystitis without hematuria: Secondary | ICD-10-CM | POA: Diagnosis not present

## 2022-08-16 DIAGNOSIS — R350 Frequency of micturition: Secondary | ICD-10-CM | POA: Diagnosis not present

## 2022-08-16 DIAGNOSIS — E109 Type 1 diabetes mellitus without complications: Secondary | ICD-10-CM | POA: Diagnosis not present

## 2022-08-19 DIAGNOSIS — Z794 Long term (current) use of insulin: Secondary | ICD-10-CM | POA: Diagnosis not present

## 2022-08-19 DIAGNOSIS — E119 Type 2 diabetes mellitus without complications: Secondary | ICD-10-CM | POA: Diagnosis not present

## 2022-09-16 ENCOUNTER — Encounter (HOSPITAL_COMMUNITY): Payer: Self-pay

## 2022-09-16 ENCOUNTER — Ambulatory Visit (HOSPITAL_COMMUNITY)
Admission: EM | Admit: 2022-09-16 | Discharge: 2022-09-16 | Disposition: A | Discharge status: Home / Self Care | Payer: Medicare Other | Attending: Internal Medicine | Admitting: Internal Medicine

## 2022-09-16 DIAGNOSIS — N3000 Acute cystitis without hematuria: Secondary | ICD-10-CM | POA: Diagnosis present

## 2022-09-16 LAB — POCT URINALYSIS DIP (MANUAL ENTRY)
Bilirubin, UA: NEGATIVE
Blood, UA: NEGATIVE
Glucose, UA: NEGATIVE mg/dL
Nitrite, UA: NEGATIVE
Protein Ur, POC: 100 mg/dL — AB
Spec Grav, UA: 1.02 (ref 1.010–1.025)
Urobilinogen, UA: 0.2 E.U./dL
pH, UA: 5.5 (ref 5.0–8.0)

## 2022-09-16 MED ORDER — CEPHALEXIN 500 MG PO CAPS: 500.0000 mg | ORAL_CAPSULE | Freq: Two times a day (BID) | ORAL | 0 refills | Status: AC

## 2022-09-16 MED ORDER — MICONAZOLE NITRATE 2 % EX AERP: INHALATION_SPRAY | CUTANEOUS | 0 refills | Status: AC

## 2022-09-16 NOTE — ED Triage Notes (Signed)
Patient here today with c/o urinary frequency at night since about May. Her BP medication was increased. She is diabetic. She has also been having some bumps on her vaginal area and around her waist. She wears depends.

## 2022-09-16 NOTE — Discharge Instructions (Addendum)
Please increase oral fluid intake Take medications as prescribed Will call you with recommendations if urine cultures require [place] to change your antibiotics If you have worsening symptoms please feel free to return to urgent care to be reevaluated.

## 2022-09-18 LAB — URINE CULTURE: Culture: >=100000 — AB

## 2022-09-19 ENCOUNTER — Telehealth: Payer: Self-pay

## 2022-09-19 MED ORDER — NITROFURANTOIN MONOHYD MACRO 100 MG PO CAPS: 100.0000 mg | ORAL_CAPSULE | Freq: Two times a day (BID) | ORAL | 0 refills | Status: AC

## 2022-09-19 NOTE — Telephone Encounter (Signed)
Per protocol, pt to dc Keflex and start tx with Macrobid.  Attempted to reach patient x1. Unable to LVM.  Rx sent to pharmacy on file.

## 2022-09-20 NOTE — ED Provider Notes (Signed)
[facility]    CSN: [id number] Arrival date & time: 09/16/22  1653      History   Chief Complaint Chief Complaint  Patient presents with   Urinary Frequency    HPI [name] is a 89 y.o. female comes to urgent care with dysuria, urgency or frequency of urination of 1 day duration.  Patient has had recurrent symptoms since May of this year.  She denies any abdominal pain.  No flank pain.  No fever, nausea or vomiting.  No confusion.  Patient wears depends on a regular basis and has some urinary incontinence.  Patient endorses that she usually have some moistness in the depends which has been bothersome for her.  HPI  Past Medical History:  Diagnosis Date   A-fib [facility]) 2019   Per patients daughter, Benetta Spar [phone]   ANEMIA, IRON DEFICIENCY 08/11/2008   ANGIOEDEMA 03/01/2008   CEREBROVASCULAR ACCIDENT, HX OF 03/01/2008   CONTACT DERMATITIS&OTHER ECZEMA DUE UNSPEC CAUSE 11/28/2008   DIABETES MELLITUS 03/01/2008   Dysuria 01/04/2010   GERD 03/01/2008   HYPERSOMNIA 05/30/2009   HYPERTENSION 03/01/2008   INTERNAL HEMORRHOIDS 03/01/2008   KNEE PAIN, RIGHT 09/05/2009   NONSPECIFIC ABN FINDNG RAD&OTH EXAM BILARY TRCT 03/07/2008   OBESITY 01/10/2010   PERIPHERAL EDEMA 08/10/2008   POLYP, GALLBLADDER 03/07/2008   Seizures (HCC) 12/2017   w/encephalopathy   SMALL BOWEL OBSTRUCTION, HX OF 03/01/2008   SNORING 05/11/2009   Stroke (HCC) 04/08/2016   TIA   THROMBOCYTOSIS 08/18/2008    Patient Active Problem List   Diagnosis Date Noted   Gait disorder 03/25/2018   Hypoalbuminemia due to protein-calorie malnutrition (HCC)    AKI (acute kidney injury) (HCC)    CKD (chronic kidney disease), stage III (HCC)    Acute blood loss anemia    Leukocytosis    Poorly controlled type 2 diabetes mellitus with peripheral neuropathy (HCC)    Labile blood glucose    Hypoglycemia    Labile blood pressure    Acute encephalopathy 01/09/2018   PAF (paroxysmal atrial fibrillation) (HCC)     Type 2 diabetes mellitus with peripheral neuropathy (HCC)    Morbid obesity (HCC)    Status epilepticus (HCC) 01/01/2018   Accelerated hypertension 01/01/2018   Acute kidney injury (HCC) 01/01/2018   Type 2 diabetes mellitus (HCC) 01/01/2018   Respiratory failure (HCC)    Hyperkalemia 07/25/2017   Bradycardia 07/25/2017   Hypotension 07/25/2017   History of stroke 07/25/2017   ARF (acute renal failure) (HCC) 07/25/2017   TIA (transient ischemic attack) 04/08/2016   Hypocalcemia 04/08/2016   OBESITY 01/10/2010   DYSURIA 01/04/2010   KNEE PAIN, RIGHT 09/05/2009   HYPERSOMNIA 05/30/2009   URI 05/11/2009   SNORING 05/11/2009   CONTACT DERMATITIS&OTHER ECZEMA DUE UNSPEC CAUSE 11/28/2008   THROMBOCYTOSIS 08/18/2008   ANEMIA, IRON DEFICIENCY 08/11/2008   PERIPHERAL EDEMA 08/10/2008   POLYP, GALLBLADDER 03/07/2008   NONSPECIFIC ABN FINDNG RAD&OTH EXAM BILARY TRCT 03/07/2008   Diabetes mellitus type II, uncontrolled 03/01/2008   Essential hypertension 03/01/2008   INTERNAL HEMORRHOIDS 03/01/2008   GERD 03/01/2008   ANGIOEDEMA 03/01/2008   CEREBROVASCULAR ACCIDENT, HX OF 03/01/2008   SMALL BOWEL OBSTRUCTION, HX OF 03/01/2008    Past Surgical History:  Procedure Laterality Date   HERNIA REPAIR  09/14/08   with release [facility]-Dr. 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      Home Medications    Prior to Admission medications   Medication Sig Start Date End Date Taking? Authorizing Provider  cephALEXin (KEFLEX) 500 MG capsule Take 1 capsule (500 mg total) by mouth 2 (two) times daily for 5 days. 09/16/22 09/21/22 Yes Lundon Verdejo, Britta Mccreedy, MD  Miconazole Nitrate 2 % AERP Apply to affected area 2-3 times a day as needed. 09/16/22  Yes Islah Eve, Britta Mccreedy, MD  acetaminophen (TYLENOL) 325 MG tablet Take 2 tablets (650 mg total) by mouth every 6 (six) hours as needed  for mild pain or fever (temp > 100.3). 01/12/18   Angiulli, Mcarthur Rossetti, PA-C  Cholecalciferol (VITAMIN D3 PO) Take 1 tablet by mouth daily with lunch.    [provider]  cloNIDine (CATAPRES) 0.2 MG tablet Take 1 tablet (0.2 mg total) by mouth 2 (two) times daily. 01/12/18   Angiulli, Mcarthur Rossetti, PA-C  diltiazem (CARDIZEM) 30 MG tablet Take 1 tablet (30 mg total) by mouth every 8 (eight) hours. 01/12/18   Angiulli, Mcarthur Rossetti, PA-C  Insulin Detemir (LEVEMIR) 100 UNIT/ML Pen Inject 16 Units into the skin 2 (two) times daily. 01/12/18   Angiulli, Mcarthur Rossetti, PA-C  IRON PO Take 325 mg by mouth daily with lunch.     [provider]  levETIRAcetam (KEPPRA) 500 MG tablet Take 1 tablet (500 mg total) by mouth 2 (two) times daily. 07/06/18   Penumalli, Glenford Bayley, MD  Multiple Vitamin (MULTIVITAMIN WITH MINERALS) TABS tablet Take 1 tablet by mouth daily.    [provider]  mupirocin ointment (BACTROBAN) 2 % Apply 1 application topically 2 (two) times daily. 10/14/18   Wieters, Hallie C, PA-C  nitrofurantoin, macrocrystal-monohydrate, (MACROBID) 100 MG capsule Take 1 capsule (100 mg total) by mouth 2 (two) times daily. 09/19/22   Merrilee Jansky, MD  nystatin cream (MYCOSTATIN) Apply to affected area 2 times daily 10/14/18   Wieters, Hallie C, PA-C  pantoprazole (PROTONIX) 40 MG tablet Take 1 tablet (40 mg total) by mouth at bedtime. 01/12/18   Angiulli, Mcarthur Rossetti, PA-C  polycarbophil (FIBERCON) 625 MG tablet Take 625 mg by mouth daily as needed for mild constipation.    [provider]  polyethylene glycol (MIRALAX / GLYCOLAX) packet Take 17 g by mouth daily. 01/10/18   Elgergawy, Leana Roe, MD  Rivaroxaban (XARELTO) 15 MG TABS tablet Take 1 tablet (15 mg total) by mouth daily with supper. 01/12/18   Angiulli, Mcarthur Rossetti, PA-C  senna-docusate (SENOKOT-S) 8.6-50 MG tablet Take 2 tablets by mouth 2 (two) times daily. 01/09/18   Elgergawy, Leana Roe, MD  vitamin B-12 (CYANOCOBALAMIN) 500 MCG  tablet Take 1 tablet (500 mcg total) by mouth daily with lunch. 01/12/18   Angiulli, Mcarthur Rossetti, PA-C    Family History Family History  Problem Relation Age of Onset   Diabetes Mother    Heart disease Mother    Diabetes Sister    Heart disease Sister    Colitis Sister    Kidney disease Brother    Breast cancer Other        Neice   Allergies Other        Nephrew   Asthma Other        Nephrew    Social History Social History   Tobacco Use   Smoking status: Never   Smokeless tobacco: Never  Vaping Use   Vaping status: Never Used  Substance Use Topics   Alcohol use: No  Drug use: No     Allergies   Ace inhibitors, Prednisone, Sulfa antibiotics, Penicillins, Sulfamethoxazole, and Sulfonamide derivatives   Review of Systems Review of Systems As per HPI  Physical Exam Triage Vital Signs ED Triage Vitals  Encounter Vitals Group     BP 09/16/22 1837 136/67     Systolic BP Percentile --      Diastolic BP Percentile --      Pulse Rate 09/16/22 1837 64     Resp 09/16/22 1837 16     Temp 09/16/22 1837 98.1 F (36.7 C)     Temp Source 09/16/22 1837 Oral     SpO2 09/16/22 1837 (!) 64 %     Weight --      Height 09/16/22 1837 5\' 1"  (1.549 m)     Head Circumference --      Peak Flow --      Pain Score 09/16/22 1836 0     Pain Loc --      Pain Education --      Exclude from Growth Chart --    No data found.  Updated Vital Signs BP 136/67 (BP Location: Left Arm)   Pulse 64   Temp 98.1 F (36.7 C) (Oral)   Resp 16   Ht 5\' 1"  (1.549 m)   SpO2 (!) 64%   BMI 39.68 kg/m   Visual Acuity Right Eye Distance:   Left Eye Distance:   Bilateral Distance:    Right Eye Near:   Left Eye Near:    Bilateral Near:     Physical Exam Vitals and nursing note reviewed.  Constitutional:      General: She is not in acute distress. Cardiovascular:     Rate and Rhythm: Normal rate and regular rhythm.     Pulses: Normal pulses.     Heart sounds: Normal heart sounds.   Pulmonary:     Effort: Pulmonary effort is normal.     Breath sounds: Normal breath sounds.  Abdominal:     General: Bowel sounds are normal.     Palpations: Abdomen is soft.  Neurological:     General: No focal deficit present.     Mental Status: She is alert and oriented to person, place, and time.      UC Treatments / Results  Labs (all labs ordered are listed, but only abnormal results are displayed)   EKG   Radiology No results found.  Procedures Procedures (including critical care time)  Medications Ordered in UC Medications - No data to display  Initial Impression / Assessment and Plan / UC Course  I have reviewed the triage vital signs and the nursing notes.  Pertinent labs & imaging results that were available during my care of the patient were reviewed by me and considered in my medical decision making (see chart for details).     1.  Acute cystitis without hematuria Point-of-care urinalysis is positive for leukocyte Estrace and protein. Keflex 500 mg twice daily for 5 days Urine cultures have been sent Miconazole powder to be applied to the groin area as needed Will call patient with recommendations Return precautions given., Final Clinical Impressions(s) / UC Diagnoses   Final diagnoses:  Acute cystitis without hematuria     Discharge Instructions      Please increase oral fluid intake Take medications as prescribed Will call you with recommendations if urine cultures require [place] to change your antibiotics If you have worsening symptoms please feel free to return to urgent  care to be reevaluated.   ED Prescriptions     Medication Sig Dispense Auth. Provider   cephALEXin (KEFLEX) 500 MG capsule Take 1 capsule (500 mg total) by mouth 2 (two) times daily for 5 days. 10 capsule Bayleigh Loflin, Britta Mccreedy, MD   Miconazole Nitrate 2 % AERP Apply to affected area 2-3 times a day as needed. 113 g Disha Cottam, Britta Mccreedy, MD      PDMP not reviewed this  encounter.   Merrilee Jansky, MD 09/20/22 [phone]

## 2023-09-08 ENCOUNTER — Emergency Department

## 2023-09-08 ENCOUNTER — Emergency Department
Admission: EM | Admit: 2023-09-08 | Discharge: 2023-09-08 | Disposition: A | Discharge status: Home / Self Care | Attending: Student | Admitting: Student

## 2023-09-08 DIAGNOSIS — R899 Unspecified abnormal finding in specimens from other organs, systems and tissues: Secondary | ICD-10-CM | POA: Insufficient documentation

## 2023-09-08 DIAGNOSIS — R739 Hyperglycemia, unspecified: Secondary | ICD-10-CM

## 2023-09-08 DIAGNOSIS — E1165 Type 2 diabetes mellitus with hyperglycemia: Secondary | ICD-10-CM | POA: Insufficient documentation

## 2023-09-08 LAB — LAB USE ONLY - CBC WITH DIFFERENTIAL
Absolute Basophils: 0.06 x10 3/uL (ref 0.00–0.08)
Absolute Eosinophils: 0.49 x10 3/uL — ABNORMAL HIGH (ref 0.00–0.44)
Absolute Immature Granulocytes: 0.03 x10 3/uL (ref 0.00–0.07)
Absolute Lymphocytes: 3.61 x10 3/uL — ABNORMAL HIGH (ref 0.42–3.22)
Absolute Monocytes: 1.22 x10 3/uL — ABNORMAL HIGH (ref 0.21–0.85)
Absolute Neutrophils: 3.79 x10 3/uL (ref 1.10–6.33)
Absolute nRBC: 0 x10 3/uL (ref <=0.00)
Basophils %: 0.7 %
Eosinophils %: 5.3 %
Hematocrit: 32.5 % — ABNORMAL LOW (ref 34.7–43.7)
Hemoglobin: 10.9 g/dL — ABNORMAL LOW (ref 11.4–14.8)
Immature Granulocytes %: 0.3 %
Lymphocytes %: 39.2 %
MCH: 27.5 pg (ref 25.1–33.5)
MCHC: 33.5 g/dL (ref 31.5–35.8)
MCV: 82.1 fL (ref 78.0–96.0)
MPV: 9.8 fL (ref 8.9–12.5)
Monocytes %: 13.3 %
Neutrophils %: 41.2 %
Platelet Count: 453 x10 3/uL — ABNORMAL HIGH (ref 142–346)
Preliminary Absolute Neutrophil Count: 3.79 x10 3/uL (ref 1.10–6.33)
RBC: 3.96 x10 6/uL (ref 3.90–5.10)
RDW: 15 % (ref 11–15)
WBC: 9.2 x10 3/uL (ref 3.10–9.50)
nRBC %: 0 /100{WBCs} (ref <=0.0)

## 2023-09-08 LAB — COMPREHENSIVE METABOLIC PANEL
ALT: 8 U/L (ref <=55)
AST (SGOT): 19 U/L (ref <=41)
Albumin/Globulin Ratio: 0.8 — ABNORMAL LOW (ref 0.9–2.2)
Albumin: 3.1 g/dL — ABNORMAL LOW (ref 3.5–5.0)
Alkaline Phosphatase: 89 U/L (ref 37–117)
Anion Gap: 9 (ref 5.0–15.0)
BUN: 21 mg/dL (ref 7–21)
Bilirubin, Total: 0.3 mg/dL (ref 0.2–1.2)
CO2: 27 meq/L (ref 17–29)
Calcium: 9 mg/dL (ref 7.9–10.2)
Chloride: 104 meq/L (ref 99–111)
Creatinine: 1.1 mg/dL — ABNORMAL HIGH (ref 0.4–1.0)
GFR: 47.8 mL/min/1.73 m2 — ABNORMAL LOW (ref >=60.0)
Globulin: 4 g/dL — ABNORMAL HIGH (ref 2.0–3.6)
Glucose: 182 mg/dL — ABNORMAL HIGH (ref 70–100)
Potassium: 4.4 meq/L (ref 3.5–5.3)
Protein, Total: 7.1 g/dL (ref 6.0–8.3)
Sodium: 140 meq/L (ref 135–145)

## 2023-09-08 LAB — URINALYSIS WITH REFLEX TO MICROSCOPIC EXAM - REFLEX TO CULTURE
Urine Bilirubin: NEGATIVE
Urine Blood: NEGATIVE
Urine Glucose: NEGATIVE
Urine Ketones: NEGATIVE mg/dL
Urine Leukocyte Esterase: NEGATIVE
Urine Nitrite: NEGATIVE
Urine Specific Gravity: 1.007 (ref 1.001–1.035)
Urine Urobilinogen: NORMAL mg/dL (ref 0.2–2.0)
Urine pH: 7.5 (ref 5.0–8.0)

## 2023-09-08 LAB — LIPID PANEL
Cholesterol / HDL Ratio: 2.8 {index}
Cholesterol: 153 mg/dL (ref <=199)
HDL: 54 mg/dL (ref >=40)
LDL Calculated: 86 mg/dL (ref 0–99)
Triglycerides: 66 mg/dL (ref 34–149)
VLDL Calculated: 13 mg/dL (ref 10–40)

## 2023-09-08 LAB — LAB USE ONLY - URINE GRAY CULTURE HOLD TUBE

## 2023-09-08 LAB — HEMOGLOBIN A1C
Average Estimated Glucose: 228.8 mg/dL
Hemoglobin A1C: 9.6 % — ABNORMAL HIGH (ref 4.6–5.6)

## 2023-09-08 LAB — WHOLE BLOOD GLUCOSE POCT: Whole Blood Glucose POCT: 191 mg/dL — ABNORMAL HIGH (ref 70–100)

## 2023-09-08 NOTE — ED Triage Notes (Signed)
 Emily Stevens is a 89 y.o. in wheelchair to the ED c/o generalized weakness since this morning.  Daughter in triage states that patient has not been compliant with her medication.  Daughter also verbalized, patient might have possible UTI.  Has presented with consistent productive cough as well.  No medication taken this morning for symptoms.  Patient is also in insulin  and has not taken her prescription this morning as well.      BP 179/87   Pulse 79   Temp 99.3 F (37.4 C) (Oral)   Resp 20   SpO2 95%

## 2023-09-08 NOTE — ED Provider Notes (Signed)
 [facility]  Emergency Department Physician Note      Diagnosis/Disposition     ED Disposition:  Discharge from ED Observation    ED Diagnosis:     Abnormal laboratory test result  Hyperglycemia    Discharge Medication List as of 09/08/2023  5:24 PM          History of Present Illness      Chief Complaint: Generalized weakness     89 y.o. female with past medical history as below presenting to the emergency department for abnormal labs and urinary symptoms.  Patient reports that she is from [place]  where her cardiologist is.  She had labs drawn on 7/31 and had multiple abnormalities on her basic metabolic panel with an elevated A1c.  She was concern for these elevated numbers and wanted to be evaluated.  She denies having any chest pain, shortness of breath, nausea, vomiting, dizziness, lightheadedness or headache.  Patient's daughter is at bedside helping to provide history      Independent Historian (other than patient): Family (list in HPI)  Additional History Provided by Independent Historian: See above    Physical Exam     ED Triage Vitals [09/08/23 1230]   Encounter Vitals Group      BP 179/87      Girls Systolic BP Percentile       Girls Diastolic BP Percentile       Boys Systolic BP Percentile       Boys Diastolic BP Percentile       Heart Rate 79      Resp Rate 20      Temp 99.3 F (37.4 C)      Temp src Oral      SpO2 95 %      Weight       Height       Head Circumference       Peak Flow       Pain Score       Pain Loc       Pain Education       Exclude from Growth Chart       Physical Exam  Vitals and nursing note reviewed.   Constitutional:       Appearance: She is not ill-appearing or toxic-appearing.   HENT:      Head: Normocephalic and atraumatic.      Nose: Nose normal.      Mouth/Throat:      Mouth: Mucous membranes are moist.   Eyes:      Conjunctiva/sclera: Conjunctivae normal.      Pupils: Pupils are equal, round, and reactive to light.   Cardiovascular:      Rate and  Rhythm: Normal rate and regular rhythm.      Pulses: Normal pulses.   Pulmonary:      Effort: Pulmonary effort is normal.   Abdominal:      General: Abdomen is flat.   Musculoskeletal:         General: Normal range of motion.      Cervical back: Normal range of motion.   Skin:     General: Skin is warm and dry.      Capillary Refill: Capillary refill takes less than 2 seconds.   Neurological:      General: No focal deficit present.      Mental Status: She is alert and oriented to person, place, and time. Mental status is at baseline.  Medical Decision Making        PRIMARY PROBLEM LIST      1. Chronic at treatment goal (Stable) DM DIAGNOSIS: See above Chronic Illness Impacting Care of the above problem: Diabetes and Advanced age Increases complexity of evaluation and Increases the risk of severe disease    differential diagnosis includes limited to electrolyte abnormality, lab error, hyperglycemia, metabolic acidosis    DISCUSSION    89 year old woman presenting to ED today with abnormal lab work.  She was seen by her primary care doctor in [place]  and was told that she had abnormal lab work.  She had a BUN of 29, creatinine of 1.6.,  Multiple testing that was contaminated with EDTA and it was not run.  She was told to come Emergency Department for evaluation.  She has no complaints at this time.  She is concerned given that her A1c was high.  Lab work repeated here in emergency department.  No evidence of severe abnormalities on CMP.  A1c at 10, lipid levels pending.  Discussed with patient that her chemistries appear well and that she will need to follow-up with her primary care doctor discussed for continued elevated A1c.  No emergent condition to keep patient in hospital or do further workup.  She is stable for discharge at this time    The patient was deemed stable for discharge. They were given strict return precautions as it relates to their presumed diagnosis, verbalized understanding of  these precautions and agreed to follow up as instructed. All questions were answered prior to discharge.    Additional Notes            Was management discussed with a consultant?: N/A   Was the decision around the need for surgery discussed with consultant?: N/A   Diagnostic test considered and not performed: N/A   Prescription medications considered and not given: N/A   Hospitalization considered but not done: N/A   Social Determinants of Health Considerations: N/A        ED Course as of 09/08/23 2137   Mon Sep 08, 2023   1558 WBC: 9.20 [LD]   1558 Hemoglobin(!): 10.9 [LD]   1558 Hematocrit(!): 32.5 [LD]   1558 Platelet Count(!): 453 [LD]      ED Course User Index  [LD] Alm Jerrol RAMAN, MD           Vital Signs: Reviewed the patient's vital signs.   Nursing Notes: Reviewed and utilized available nursing notes.   Medical Records Reviewed: Reviewed available past medical records.   Counseling: The emergency provider has spoken with the patient and discussed today's findings, in addition to providing specific details for the plan of care. Questions are answered and there is agreement with the plan.     CRITICAL CARE/PROCEDURES    Procedures       CARDIAC STUDIES      The following cardiac studies were independently interpreted by me the Emergency Medicine Provider. For full cardiac study results please see chart.                                                                   EMERGENCY IMAGING STUDIES      The following imaging studies were independently interpreted by me (emergency  medicine provider):                                       Supplemental Encounter Data   Medical History[7]  Past Surgical History[8]  Social History[9]  Family History[10]  Allergies[11]    Encounter Orders:  Orders Placed This Encounter   Procedures    XR Chest 2 Views    Urinalysis with Reflex to Microscopic Exam and Culture    Urine Elnor Culture Hold Tube    CBC with Differential (Order)    Comprehensive Metabolic Panel     Hemoglobin J8R    Lipid Panel    CBC with Differential (Component)    Glucose POC     Medications Administered:  Medications - No data to display  Laboratory and Imaging Studies:  Results for orders placed or performed during the hospital encounter of 09/08/23 (from the past 24 hours)   Urinalysis with Reflex to Microscopic Exam and Culture    Collection Time: 09/08/23 12:37 PM    Specimen: Urine, Clean Catch   Result Value    Urine Color Colorless    Urine Clarity Clear    Urine Specific Gravity 1.007    Urine pH 7.5    Urine Leukocyte Esterase Negative    Urine Nitrite Negative    Urine Protein 70= 1+ (A)    Urine Glucose Negative    Urine Ketones Negative    Urine Urobilinogen Normal    Urine Bilirubin Negative    Urine Blood Negative    RBC, UA 0-2    Urine WBC 0-5    Urine Squamous Epithelial Cells 0-5   Urine Gray Culture Hold Tube    Collection Time: 09/08/23 12:37 PM   Result Value    Extra Tube Hold for add-ons.    Collection Time: 09/08/23  2:07 PM   Result Value    Whole Blood Glucose POCT 191 (H)   Comprehensive Metabolic Panel    Collection Time: 09/08/23  3:35 PM   Result Value    Glucose 182 (H)    BUN 21    Creatinine 1.1 (H)    Sodium 140    Potassium 4.4    Chloride 104    CO2 27    Calcium 9.0    Anion Gap 9.0    GFR 47.8 (L)    AST (SGOT) 19    ALT 8    Alkaline Phosphatase 89    Albumin 3.1 (L)    Protein, Total 7.1    Globulin 4.0 (H)    Albumin/Globulin Ratio 0.8 (L)    Bilirubin, Total 0.3   Hemoglobin A1C    Collection Time: 09/08/23  3:35 PM   Result Value    Hemoglobin A1C 9.6 (H)    Average Estimated Glucose 228.8   Lipid Panel    Collection Time: 09/08/23  3:35 PM   Result Value    Cholesterol 153    Triglycerides 66    HDL 54    LDL Calculated 86    VLDL Calculated 13    Cholesterol / HDL Ratio 2.8   CBC with Differential (Component)    Collection Time: 09/08/23  3:35 PM   Result Value    WBC 9.20    Hemoglobin 10.9 (L)    Hematocrit 32.5 (L)    Platelet Count 453 (H)    MPV 9.8    RBC  3.96    MCV 82.1    MCH 27.5    MCHC 33.5    RDW 15    nRBC % 0.0    Absolute nRBC 0.00    Preliminary Absolute Neutrophil Count 3.79    Neutrophils % 41.2    Lymphocytes % 39.2    Monocytes % 13.3    Eosinophils % 5.3    Basophils % 0.7    Immature Granulocytes % 0.3    Absolute Neutrophils 3.79    Absolute Lymphocytes 3.61 (H)    Absolute Monocytes 1.22 (H)    Absolute Eosinophils 0.49 (H)    Absolute Basophils 0.06    Absolute Immature Granulocytes 0.03     XR Chest 2 Views   Final Result       No acute abnormality within the chest.      Elwood Miyamoto, MD   09/08/2023 1:48 PM                     [7]   Past Medical History:  Diagnosis Date    Angioedema     Diabetes mellitus (CMS/HCC)     Hypertension    [8]   Past Surgical History:  Procedure Laterality Date    HERNIA REPAIR      HYSTERECTOMY     [9]   Social History  Tobacco Use    Smoking status: Never    Smokeless tobacco: Never   Vaping Use    Vaping status: Never Used   Substance Use Topics    Alcohol use: No    Drug use: No   [10] No family history on file.  [11]   Allergies  Allergen Reactions    Ace Inhibitors     Dye [Iodinated Contrast Media]     Penicillins     Prednisolone     Sulfa Antibiotics         Alm Jerrol RAMAN, MD  09/08/23 2137

## 2023-09-28 ENCOUNTER — Emergency Department

## 2023-09-28 ENCOUNTER — Emergency Department: Admission: EM | Admit: 2023-09-28 | Discharge: 2023-09-28 | Disposition: A | Discharge status: Home / Self Care

## 2023-09-28 DIAGNOSIS — W19XXXA Unspecified fall, initial encounter: Secondary | ICD-10-CM

## 2023-09-28 DIAGNOSIS — E041 Nontoxic single thyroid nodule: Secondary | ICD-10-CM

## 2023-09-28 DIAGNOSIS — S60212A Contusion of left wrist, initial encounter: Secondary | ICD-10-CM | POA: Insufficient documentation

## 2023-09-28 DIAGNOSIS — K118 Other diseases of salivary glands: Secondary | ICD-10-CM

## 2023-09-28 DIAGNOSIS — W1830XA Fall on same level, unspecified, initial encounter: Secondary | ICD-10-CM | POA: Insufficient documentation

## 2023-09-28 DIAGNOSIS — E042 Nontoxic multinodular goiter: Secondary | ICD-10-CM | POA: Insufficient documentation

## 2023-09-28 DIAGNOSIS — S0990XA Unspecified injury of head, initial encounter: Secondary | ICD-10-CM | POA: Insufficient documentation

## 2023-09-28 DIAGNOSIS — R030 Elevated blood-pressure reading, without diagnosis of hypertension: Secondary | ICD-10-CM

## 2023-09-28 DIAGNOSIS — I10 Essential (primary) hypertension: Secondary | ICD-10-CM | POA: Insufficient documentation

## 2023-09-28 DIAGNOSIS — S1093XA Contusion of unspecified part of neck, initial encounter: Secondary | ICD-10-CM | POA: Insufficient documentation

## 2023-09-28 DIAGNOSIS — K119 Disease of salivary gland, unspecified: Secondary | ICD-10-CM | POA: Insufficient documentation

## 2023-09-28 HISTORY — DX: Unspecified atrial fibrillation: I48.91

## 2023-09-28 LAB — WHOLE BLOOD GLUCOSE POCT: Whole Blood Glucose POCT: 211 mg/dL — ABNORMAL HIGH (ref 70–100)

## 2023-09-28 MED ORDER — RIVAROXABAN 15 MG PO TABS: 15.0000 mg | ORAL_TABLET | Freq: Every day | ORAL | 0 refills | Status: AC

## 2023-09-28 NOTE — ED Provider Notes (Signed)
 [facility]  Emergency Department Physician Note      Diagnosis/Disposition     ED Disposition:  Discharge    ED Diagnosis:     Fall from standing, initial encounter  Injury of head, initial encounter  Contusion of neck, initial encounter  Contusion of left wrist, initial encounter  Parotid nodule  Thyroid  nodule  Elevated blood pressure reading    Discharge Medication List as of 09/28/2023  4:23 PM          History of Present Illness      Chief Complaint: Fall (On OAC) and Wrist Pain (Left)     89 y.o. female with past medical history of hypertension, hyperlipidemia, diabetes, diabetic nephropathy presents emergency department after fall.  Patient was being assisted on a rollator with her son when they went on uneven pavement and fell backwards.  The patient fell backwards and hit her head though denies any vomiting, diarrhea, chest pain, abdominal pain.  She has some mild discomfort in the back of her head as well as tenderness to her left wrist.  Movement makes it tenderness to her left wrist particular her left thumb worse.  She denies specific headache or new focal numbness or weakness.  She did not lose consciousness and is on Xarelto  for recently diagnosed A-fib.  This fall occurred yesterday and she went to urgent care.  Urgent care recommended that she come in here but she decided to come in today.      Independent Historian (other than patient): Family (list in HPI)  Additional History Provided by Independent Historian: Daughter, HPI    Physical Exam     ED Triage Vitals   Encounter Vitals Group      BP 09/28/23 1319 190/77      Girls Systolic BP Percentile --       Girls Diastolic BP Percentile --       Boys Systolic BP Percentile --       Boys Diastolic BP Percentile --       Heart Rate 09/28/23 1319 83      Resp Rate 09/28/23 1319 16      Temp 09/28/23 1319 98.2 F (36.8 C)      Temp src 09/28/23 1319 Oral      SpO2 09/28/23 1319 96 %      Weight 09/28/23 1315 100 kg      Height 09/28/23  1315 1.549 m      Head Circumference --       Peak Flow --       Pain Score 09/28/23 1315 7      Pain Loc --       Pain Education --       Exclude from Growth Chart --       Physical Exam  Vitals and nursing note reviewed.   Constitutional:       Appearance: Normal appearance.   HENT:      Head: Normocephalic.   Eyes:      Extraocular Movements: Extraocular movements intact.      Pupils: Pupils are equal, round, and reactive to light.   Neck:      Comments: Tenderness to palpation C5-C6 area.  Cardiovascular:      Rate and Rhythm: Normal rate and regular rhythm.      Comments: 2+ radial and pedal pulses bilaterally.  Pulmonary:      Effort: Pulmonary effort is normal.      Breath sounds: Normal breath sounds.  Abdominal:      Palpations: Abdomen is soft.      Tenderness: There is no abdominal tenderness.   Musculoskeletal:         General: Normal range of motion.      Cervical back: Normal range of motion.      Comments: Tenderness palpation along the medial aspect of the left thumb, base of the left thumb, left medial wrist and forearm.  Mild associated swelling to the left wrist.  5 out of 5 grip strength and able to flex and extend at wrist and elbow no tenderness palpation of bilateral elbows, shoulders or right wrist or hand.  Full range of motion of all joints.  Some tenderness palpation to the right hip, the patient notes primarily this causes radiation of pain from her hip downwards and this pain was happening before she had the fall.  No tenderness to palpation along the spine outside of the C-spine.  No CVA tenderness to palpation.   Skin:     General: Skin is warm and dry.   Neurological:      Mental Status: She is alert.      Comments: CN 2-12 intact, normal strength and sensation in bilateral upper and lower extremities. Normal finger to nose testing. Ambulating at baseline.    Psychiatric:         Mood and Affect: Mood normal.          Medical Decision Making   Patient presents emergency department  with left wrist pain posterior neck.  Head and neck CT do not demonstrate dental findings on possible neoplasm including parotid gland nodules.  Thyroid  nodules.  She was informed of these findings encouraged at length to follow-up closely as this may represent malignancy.  Referral for primary care doctor as well as information to call the ear nose and throat doctor.  Patient has very mild tenderness to right hip that an x-ray did not demonstrate any acute abnormalities.  She did have notable tenderness to her left thumb and wrist.  Her x-rays were unremarkable, however given the location of tenderness she was placed in a thumb spica splint and given careful instructions to follow-up with orthopedics.  Daughter also requested a short course of oral anticoagulant as they have been unable to fill her refill recently as it is not in the pharmacy close to her house.  Patient was last but she denies any chest or pressure, shortness of breath, abdominal pain, focal numbness or weakness or headache or other symptoms at this time outside of the mild left wrist tenderness.  She and daughter both note that she is not taking her home medications which are adequate for her.  I believe is reasonable for her to be discharged and take her home medications prescribed.  She is given strict base at a nearby pharmacy refill.  She was discharged in stable condition with precautions and follow-up instructions.         PRIMARY PROBLEM LIST      1. Acute illness/injury with risk to life or bodily function (based on differential diagnosis or evaluation) DIAGNOSIS:Fall   Chronic Illness Impacting Care of the above problem: Atrial Fibrillation/Chronic Arrhythmia and Advanced age Increases complexity of evaluation and Increases the risk of severe disease   Differential Diagnosis: Head injury: contusion, Concussion, ICH, Abrasion     DISCUSSION        The patient was deemed stable for discharge. They were given strict return precautions as it  relates to  their presumed diagnosis, verbalized understanding of these precautions and agreed to follow up as instructed. All questions were answered prior to discharge.    Additional Notes      External Records Reviewed?: Physician Office Records   If patient is being hospitalized is severe sepsis or septic shock suspected?: N/A   Was management discussed with a consultant?: N/A   Was the decision around the need for surgery discussed with consultant?: N/A   Diagnostic test considered and not performed: N/A   Prescription medications considered and not given: N/A   Hospitalization considered but not done: N/A   Social Determinants of Health Considerations: N/A   Was there decision to not resuscitate or to de-escalate care due to poor prognosis?: N/A              Vital Signs: Reviewed the patient's vital signs.   Nursing Notes: Reviewed and utilized available nursing notes.   Medical Records Reviewed: Reviewed available past medical records.   Counseling: The emergency provider has spoken with the patient and discussed today's findings, in addition to providing specific details for the plan of care. Questions are answered and there is agreement with the plan.     CRITICAL CARE/PROCEDURES    Procedures     O2 Sat:  The patient's oxygen saturation was 96 % on room air. This was independently interpreted by me as Normal.   CARDIAC STUDIES      The following cardiac studies were independently interpreted by me the Emergency Medicine Provider. For full cardiac study results please see chart.           EMERGENCY IMAGING STUDIES      The following imaging studies were independently interpreted by me (emergency medicine provider):     Left wrist X-ray was interpreted by me.   No acute fracture or dislocation. Old distal radius fracture.    Left forearm x-ray was interpreted by me.   No acute fracture or dislocation. Old distal radius fracture.    Left thumb x-ray was interpreted by me.   No acute fracture or dislocation.   Osteoarthritis of the wrist    Pelvis and right hip x-ray interpreted by me  Mild osteoarthritis bilateral hips.  No acute fracture or dislocation                         Supplemental Encounter Data   Medical History[7]  Past Surgical History[8]  Social History[9]  Family History[10]  Allergies[11]    Encounter Orders:  Orders Placed This Encounter   Procedures    Thumb spica    CT Head without Contrast    CT Cervical Spine without Contrast    Wrist Left PA Lateral and Oblique    Forearm Complete Left    Finger Left Minimum 2 Vw    XR Hip right 1 vw with pelvis    Referral to Primary Care    Referral to Orthopedic Surgery     Medications Administered:  Medications - No data to display  Laboratory and Imaging Studies:       CT Cervical Spine without Contrast   Final Result          1.No acute fracture or traumatic malalignment in the cervical spine.   2.Degenerative changes with canal and foraminal narrowing.   3.Enlarged thyroid  with multiple nodules. Based on size of the nodules,   thyroid  ultrasound is advised.   4.Additional nodule in the right parotid gland. This raises  suspicion for a   primary parotid neoplasm.   5.Nonspecific mild enlargement of a couple left low cervical lymph nodes.      Lynwood Artist Gee MD, MD   09/28/2023 3:11 PM      CT Head without Contrast   Final Result          1.No acute intracranial abnormality.   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   3.Atherosclerosis, extensive chronic small vessel ischemic changes in the   white matter, and generalized brain volume loss.      Lynwood Artist Gee MD, MD   09/28/2023 3:04 PM      XR Hip right 1 vw with pelvis   Final Result      1. No fracture of the right hip or pelvis. However, assessment for   nondisplaced fracture is limited. If there is persistent pain and   difficulty with weightbearing, then further assessment with MRI or CT would   be recommended to exclude occult fracture.      2. Mild to moderate osteoarthritis of the right and left  hip joints.      3. Lumbar spondylosis.      Derick Clap, MD   09/28/2023 3:06 PM      Forearm Complete Left   Final Result      1. No acute fracture of the radius or ulna.      2. Old healed fracture of the distal radius.      Derick Clap, MD   09/28/2023 3:02 PM      Finger Left Minimum 2 Vw   Final Result      1. No fracture of the thumb.      2. Mild osteoarthritis at the first carpometacarpal joint.      Derick Clap, MD   09/28/2023 3:00 PM      Wrist Left PA Lateral and Oblique   Final Result      1. No acute fracture of the wrist.      2. Old healed fracture of the distal radial metaphysis.      Derick Clap, MD   09/28/2023 2:59 PM                     [7]   Past Medical History:  Diagnosis Date    Angioedema     Atrial fibrillation (CMS/HCC)     Diabetes mellitus (CMS/HCC)     Hypertension    [8]   Past Surgical History:  Procedure Laterality Date    HERNIA REPAIR      HYSTERECTOMY     [9]   Social History  Tobacco Use    Smoking status: Never    Smokeless tobacco: Never   Vaping Use    Vaping status: Never Used   Substance Use Topics    Alcohol use: No    Drug use: No   [10] No family history on file.  [11]   Allergies  Allergen Reactions    Ace Inhibitors     Dye [Iodinated Contrast Media]     Penicillins     Prednisolone     Sulfa Antibiotics         Delray Norris, MD  09/30/23 1320

## 2023-09-28 NOTE — ED Notes (Signed)
 Per pt and daughter, pt fell out of Rolator yesterday - hit her head and hurt her left wrist  Taking blood thinners  [facility]    Denies headache and n/v  At baseline mental status  Pupils equal and reactive bilateral    Pain to left thumb up radial side of left wrist - hx of broken left wrist  Movement/sensation intact to all four extremities  Able to pronate/supinate left wrist and move fingers    Respirations unlabored and even  NAD

## 2023-09-28 NOTE — Discharge Instructions (Addendum)
 You were seen in the emergency department for fall from standing. Please be sure to follow up with your primary care doctor and any specialists noted above. Please return to the emergency department in case of numbness or weakness in one part of your body or any worsening symptoms.  You have pain at the base of your left thumb and I have given you a thumb spica splint for this.  Is important that you wear this 24/7 with the exception of showering and follow-up with orthopedics as instructed. you were incidentally found to have a nodule in your parotid gland which could be cancer and needs follow-up.  I have placed information for an ear nose and throat doctor below.  Please call them as soon as possible for further evaluation.  You are also found to have thyroid  nodules which the ear nose and throat doctor can also further evaluate. Resume taking blood thinners TODAY and take your home blood pressure medication (you missed your dose today).

## 2023-10-07 ENCOUNTER — Other Ambulatory Visit: Payer: Self-pay

## 2023-10-07 DIAGNOSIS — E041 Nontoxic single thyroid nodule: Secondary | ICD-10-CM

## 2023-10-07 DIAGNOSIS — K118 Other diseases of salivary glands: Secondary | ICD-10-CM

## 2023-10-08 ENCOUNTER — Ambulatory Visit
Admission: RE | Admit: 2023-10-08 | Discharge: 2023-10-08 | Disposition: A | Discharge status: Home / Self Care | Source: Ambulatory Visit

## 2023-10-08 ENCOUNTER — Ambulatory Visit
Admission: RE | Admit: 2023-10-08 | Discharge: 2023-10-08 | Discharge status: Home / Self Care | Source: Ambulatory Visit

## 2023-10-08 DIAGNOSIS — E041 Nontoxic single thyroid nodule: Secondary | ICD-10-CM | POA: Insufficient documentation

## 2023-10-08 DIAGNOSIS — K118 Other diseases of salivary glands: Secondary | ICD-10-CM | POA: Insufficient documentation

## 2023-10-09 ENCOUNTER — Emergency Department
Admission: EM | Admit: 2023-10-09 | Discharge: 2023-10-09 | Disposition: A | Discharge status: Home / Self Care | Attending: Emergency Medicine | Admitting: Emergency Medicine

## 2023-10-09 DIAGNOSIS — E1165 Type 2 diabetes mellitus with hyperglycemia: Secondary | ICD-10-CM | POA: Insufficient documentation

## 2023-10-09 DIAGNOSIS — M545 Low back pain, unspecified: Secondary | ICD-10-CM | POA: Insufficient documentation

## 2023-10-09 DIAGNOSIS — M79606 Pain in leg, unspecified: Secondary | ICD-10-CM | POA: Insufficient documentation

## 2023-10-09 DIAGNOSIS — R739 Hyperglycemia, unspecified: Secondary | ICD-10-CM

## 2023-10-09 DIAGNOSIS — I10 Essential (primary) hypertension: Secondary | ICD-10-CM

## 2023-10-09 LAB — COMPREHENSIVE METABOLIC PANEL
ALT: 13 U/L (ref <=55)
AST (SGOT): 24 U/L (ref <=41)
Albumin/Globulin Ratio: 0.8 — ABNORMAL LOW (ref 0.9–2.2)
Albumin: 3.3 g/dL — ABNORMAL LOW (ref 3.5–4.9)
Alkaline Phosphatase: 93 U/L (ref 37–117)
Anion Gap: 10 (ref 5.0–15.0)
BUN: 21 mg/dL (ref 7–21)
Bilirubin, Total: 0.2 mg/dL (ref 0.2–1.2)
CO2: 27 meq/L (ref 17–29)
Calcium: 9.7 mg/dL (ref 7.9–10.2)
Chloride: 100 meq/L (ref 99–111)
Creatinine: 1.2 mg/dL — ABNORMAL HIGH (ref 0.4–1.0)
GFR: 43 mL/min/1.73 m2 — ABNORMAL LOW (ref >=60.0)
Globulin: 4.2 g/dL — ABNORMAL HIGH (ref 2.0–3.6)
Glucose: 257 mg/dL — ABNORMAL HIGH (ref 70–100)
Potassium: 4.9 meq/L (ref 3.5–5.3)
Protein, Total: 7.5 g/dL (ref 6.0–8.3)
Sodium: 137 meq/L (ref 135–145)

## 2023-10-09 LAB — URINALYSIS WITH REFLEX TO MICROSCOPIC EXAM - REFLEX TO CULTURE
Urine Bilirubin: NEGATIVE
Urine Blood: NEGATIVE
Urine Ketones: NEGATIVE mg/dL
Urine Leukocyte Esterase: NEGATIVE
Urine Nitrite: NEGATIVE
Urine Specific Gravity: 1.007 (ref 1.001–1.035)
Urine Urobilinogen: NORMAL mg/dL (ref 0.2–2.0)
Urine pH: 7.5 (ref 5.0–8.0)

## 2023-10-09 LAB — LAB USE ONLY - CBC WITH DIFFERENTIAL
Absolute Basophils: 0.07 x10 3/uL (ref 0.00–0.08)
Absolute Eosinophils: 0.49 x10 3/uL — ABNORMAL HIGH (ref 0.00–0.44)
Absolute Immature Granulocytes: 0.02 x10 3/uL (ref 0.00–0.07)
Absolute Lymphocytes: 3.44 x10 3/uL — ABNORMAL HIGH (ref 0.42–3.22)
Absolute Monocytes: 1.15 x10 3/uL — ABNORMAL HIGH (ref 0.21–0.85)
Absolute Neutrophils: 3.92 x10 3/uL (ref 1.10–6.33)
Absolute nRBC: 0 x10 3/uL (ref <=0.00)
Basophils %: 0.8 %
Eosinophils %: 5.4 %
Hematocrit: 34.7 % (ref 34.7–43.7)
Hemoglobin: 10.9 g/dL — ABNORMAL LOW (ref 11.4–14.8)
Immature Granulocytes %: 0.2 %
Lymphocytes %: 37.8 %
MCH: 26.3 pg (ref 25.1–33.5)
MCHC: 31.4 g/dL — ABNORMAL LOW (ref 31.5–35.8)
MCV: 83.6 fL (ref 78.0–96.0)
MPV: 10.5 fL (ref 8.9–12.5)
Monocytes %: 12.7 %
Neutrophils %: 43.1 %
Platelet Count: 497 x10 3/uL — ABNORMAL HIGH (ref 142–346)
Preliminary Absolute Neutrophil Count: 3.92 x10 3/uL (ref 1.10–6.33)
RBC: 4.15 x10 6/uL (ref 3.90–5.10)
RDW: 15 % (ref 11–15)
WBC: 9.09 x10 3/uL (ref 3.10–9.50)
nRBC %: 0 /100{WBCs} (ref <=0.0)

## 2023-10-09 LAB — COVID-19 (SARS-COV-2) AND INFLUENZA A/B AND RSV
Influenza A RNA: NEGATIVE
Influenza B RNA: NEGATIVE
Respiratory Syncytial Virus RNA: NEGATIVE
SARS-CoV-2 (COVID-19) RNA: NEGATIVE

## 2023-10-09 MED ORDER — ACETAMINOPHEN 500 MG PO TABS
1000.0000 mg | ORAL_TABLET | Freq: Once | ORAL | Status: AC
Start: 2023-10-09 — End: 2023-10-09
  Administered 2023-10-09: 1000 mg via ORAL
  Filled 2023-10-09: qty 2

## 2023-10-09 MED ORDER — DILTIAZEM HCL ER COATED BEADS 120 MG PO CP24
120.0000 mg | ORAL_CAPSULE | Freq: Once | ORAL | Status: DC
Start: 2023-10-09 — End: 2023-10-09
  Filled 2023-10-09: qty 1

## 2023-10-09 MED ORDER — CLONIDINE HCL 0.1 MG PO TABS
0.1000 mg | ORAL_TABLET | Freq: Once | ORAL | Status: DC
Start: 2023-10-09 — End: 2023-10-09
  Filled 2023-10-09: qty 1

## 2023-10-09 MED ORDER — SODIUM CHLORIDE 0.9 % IV BOLUS
500.0000 mL | Freq: Once | INTRAVENOUS | Status: AC
Start: 2023-10-09 — End: 2023-10-09
  Administered 2023-10-09: 500 mL via INTRAVENOUS
  Filled 2023-10-09: qty 500

## 2023-10-09 NOTE — Discharge Instructions (Signed)
 1. Return immediately if worse in any way.    2. Follow up with your primary medical doctor for recheck.  Return to the emergency deparment if unable to follow up as instructed for any reason.    3. If you have any issues or questions about follow up please call [phone] for assistance.

## 2023-10-10 LAB — ECG 12-LEAD
Atrial Rate: 81 {beats}/min
P Axis: 36 degrees
P-R Interval: 314 ms
Q-T Interval: 386 ms
QRS Duration: 136 ms
QTC Calculation (Bezet): 448 ms
R Axis: -66 degrees
T Axis: 73 degrees
Ventricular Rate: 81 {beats}/min

## 2023-10-10 LAB — LAB USE ONLY - URINE GRAY CULTURE HOLD TUBE

## 2023-10-11 LAB — CULTURE, URINE: Culture Urine: NORMAL

## 2023-10-12 NOTE — ED Provider Notes (Signed)
 [facility]  Emergency Department Physician Note      Diagnosis/Disposition     ED Disposition:  Discharge    ED Diagnosis:     Acute right-sided low back pain without sciatica  Hyperglycemia    Discharge Medication List as of 10/09/2023  6:21 PM          History of Present Illness      Chief Complaint: Generalized Body Aches     89 y.o. female with past medical history as below   History of Present Illness  Emily Stevens is an 89 year old female with stage three kidney disease who presents with leg pain and concerns of a urinary tract infection.    She experiences pain in the backs of her legs, radiating up and down, which prevents her from standing up straight.  This back pain has been going on for years without any acute change.  No trauma.  She has not taken Tylenol  for the pain. She attributes the pain to prolonged sitting and has stopped walking the stairs, which she previously did for exercise.    She suspects a urinary tract infection might be returning, associating changes in her mentality and personality with a UTI per daughter. She has no chills or abdominal pain.     She has stage three kidney disease and has not seen a nephrologist in over a year after discontinuing care with her previous doctor.  No issues with bowel or bladder incontinence or retention.  No focal numbness tingling or weakness.    Independent Historian (other than patient): Family (list in HPI)  Additional History Provided by Independent Historian: Daughter notes patient does not ambulate much.       Physical Exam     ED Triage Vitals   Encounter Vitals Group      BP 10/09/23 1337 (!) 214/104      Girls Systolic BP Percentile --       Girls Diastolic BP Percentile --       Boys Systolic BP Percentile --       Boys Diastolic BP Percentile --       Heart Rate 10/09/23 1337 81      Resp Rate 10/09/23 1337 20      Temp 10/09/23 1337 98 F (36.7 C)      Temp src 10/09/23 1337 Oral      SpO2 10/09/23 1337 100 %      Weight  10/09/23 1337 97.5 kg      Height 10/09/23 1337 1.549 m      Head Circumference --       Peak Flow --       Pain Score 10/09/23 1532 5      Pain Loc --       Pain Education --       Exclude from Growth Chart --       Physical Exam   Physical Exam  CONSTITUTIONAL: Well developed, well nourished. Awake and alert.  Fully oriented.  HEAD: Atraumatic. Normocephalic.  EYES: Conjunctivae are not pale.  ENT: Mucous membranes are moist and intact. Patent airway. Oral cavity normal.  NECK: Supple. No JVD.  PULMONARY: No respiratory distress. No stridor.  Normal heart rate  ABDOMEN: Non-distended. Non-tender.  SKIN: Skin is warm and dry. No diaphoresis.  EXTREMITIES: No cyanosis. No gross deformity.  NEUROLOGICAL: Normal speech. Moves all extremities.  Ambulatory in the emergency department  PSYCHIATRIC: Good eye contact. Normal interaction, affect, and behavior.  MUSCULOSKELETAL: Knee and shoulder range of motion normal.       Medical Decision Making        PRIMARY PROBLEM LIST      1. Acute illness/injury with risk to life or bodily function (based on differential diagnosis or evaluation) DIAGNOSIS: Generalized weakness         DISCUSSION    Patient presents emerged from with generalized weakness and back pain.  Mild anemia noted but this is chronic.  Renal function is stable without significant electrolyte abnormalities.  Hyperglycemia without anion gap noted.  Urinalysis is negative for UTI shows chronic proteinuria.  Doubt ACS PE or aortic abnormality.  Doubt intracranial etiology.  No red flag signs of back pain.  Patient is ambulatory in the ED.  Patient requesting discharge.  Referral for home physical therapy placed.  Will discharge home follow-up primary medical doctor return if worse.  Patient and daughter understand and agree with plan.  Assessment & Plan    The patient was deemed stable for discharge. They were given strict return precautions as it relates to their presumed diagnosis, verbalized understanding of  these precautions and agreed to follow up as instructed. All questions were answered prior to discharge.    Additional Notes      External Records Reviewed?: Inpatient Records inpatient admission for hyponatremia October 21, 2021                                     Vital Signs: Reviewed the patient's vital signs.   Nursing Notes: Reviewed and utilized available nursing notes.   Medical Records Reviewed: Reviewed available past medical records.   Counseling: The emergency provider has spoken with the patient and discussed today's findings, in addition to providing specific details for the plan of care. Questions are answered and there is agreement with the plan.     CRITICAL CARE/PROCEDURES    Procedures         CARDIAC STUDIES      The following cardiac studies were independently interpreted by me the Emergency Medicine Provider. For full cardiac study results please see chart.              EKG 1 interpreted by me (ED provider)  Comparison: Yes.  No acute changes.  DATE: October 21, 2021  Time Interpreted: 1529  Rate: 60-100   Rhythm: Normal Sinus Rhythm   ST segments: No acute changes   STEMI?: No   EKG interpretation: Nonspecific                                 EMERGENCY IMAGING STUDIES      The following imaging studies were independently interpreted by me (emergency medicine provider):                                       Supplemental Encounter Data   Medical History[7]  Past Surgical History[8]  Social History[9]  Family History[10]  Allergies[11]    Encounter Orders:  Orders Placed This Encounter   Procedures    Culture, Urine    COVID-19, Influenza A/B and RSV (Cepheid) Symptomatic Children <5 years and adult residents of nursing homes [facility] ED's only    CBC with Differential (Order)    Comprehensive Metabolic  Panel    Urinalysis with Reflex to Microscopic Exam and Culture    Urine Elnor Culture Hold Tube    CBC with Differential (Component)    Diet PO Challenge    Home Health face-to-face (FTF)  Encounter    ECG 12 Lead     Medications Administered:  Medications   acetaminophen  (TYLENOL ) tablet 1,000 mg (1,000 mg Oral Given 10/09/23 1603)   sodium chloride  0.9 % bolus 500 mL (0 mLs Intravenous Stopped 10/09/23 1755)     Laboratory and Imaging Studies:     No orders to display                  [7]   Past Medical History:  Diagnosis Date    Angioedema     Atrial fibrillation (CMS/HCC)     Diabetes mellitus (CMS/HCC)     Hypertension    [8]   Past Surgical History:  Procedure Laterality Date    HERNIA REPAIR      HYSTERECTOMY     [9]   Social History  Tobacco Use    Smoking status: Never    Smokeless tobacco: Never   Vaping Use    Vaping status: Never Used   Substance Use Topics    Alcohol use: No    Drug use: No   [10] No family history on file.  [11]   Allergies  Allergen Reactions    Ace Inhibitors     Dye [Iodinated Contrast Media]     Penicillins     Prednisolone     Sulfa Antibiotics         Aretha Ozell BIRCH, MD  10/12/23 [phone]

## 2023-10-14 ENCOUNTER — Ambulatory Visit (INDEPENDENT_AMBULATORY_CARE_PROVIDER_SITE_OTHER): Admitting: Internal Medicine

## 2023-10-14 ENCOUNTER — Encounter (INDEPENDENT_AMBULATORY_CARE_PROVIDER_SITE_OTHER): Payer: Self-pay | Admitting: Internal Medicine

## 2023-10-14 VITALS — BP 122/88 | HR 82 | Temp 97.6°F | Resp 14 | Ht 61.0 in | Wt 218.0 lb

## 2023-10-14 DIAGNOSIS — N1832 Chronic kidney disease, stage 3b: Secondary | ICD-10-CM

## 2023-10-14 DIAGNOSIS — Z6841 Body Mass Index (BMI) 40.0 and over, adult: Secondary | ICD-10-CM

## 2023-10-14 DIAGNOSIS — I4891 Unspecified atrial fibrillation: Secondary | ICD-10-CM

## 2023-10-14 DIAGNOSIS — I10 Essential (primary) hypertension: Secondary | ICD-10-CM

## 2023-10-14 DIAGNOSIS — W19XXXA Unspecified fall, initial encounter: Secondary | ICD-10-CM

## 2023-10-14 DIAGNOSIS — E119 Type 2 diabetes mellitus without complications: Secondary | ICD-10-CM

## 2023-10-14 NOTE — Progress Notes (Signed)
 Have you seen any specialists/other providers since your last visit with us ?    No    Health Maintenance Due   Topic Date Due    OPHTHALMOLOGY EXAM  Never done    URINE MICROALBUMIN  Never done    DEPRESSION SCREENING  Never done    COVID-19 Vaccine (4 - 2025-26 season) 09/22/2023

## 2023-10-14 NOTE — Progress Notes (Signed)
 [facility]           Subjective     Chief Complaint   Patient presents with    Establish Care     Amiodarone 200 mg (1/2)  History of Present Illness  Emily Stevens is an 89 year old female with PMH of HTN, HLD, DM 2, afib and diabetic nephropathy who presents with back pain and diabetes management. She is accompanied by her daughter, who is her primary caregiver.  She experiences persistent back pain, attributed to her sciatic nerve, and uses Tylenol  Extra Strength for relief. She visited the ER last Thursday due to this pain, which remains particularly in her back. She believes prolonged sitting exacerbates the inflammation.  She manages her diabetes with insulin , administering 20 units in the morning and 10 units at night, adjusting the dose if she eats late. Her blood sugar levels range from 200 to 300. She consumes sugary snacks and drinks Soda.  She has stage 3 chronic kidney disease and has not consulted a kidney specialist since last year. She was previously on Lasix for swelling but has discontinued it. She takes clonidine  three times a day for hypertension, with previous adjustments to losartan and diltiazem  by her kidney specialist.    Review of Systems   Constitutional:  Negative for chills and fever.   Respiratory:  Negative for cough and shortness of breath.    Cardiovascular:  Negative for chest pain.   Gastrointestinal:  Negative for abdominal pain.   Musculoskeletal:  Positive for back pain.   Neurological:  Negative for weakness, numbness and headaches.       Objective   BP 122/88 (BP Site: Left arm, Patient Position: Sitting, Cuff Size: Large)   Pulse 82   Temp 97.6 F (36.4 C) (Temporal)   Resp 14   Ht 1.549 m (5' 1)   Wt 98.9 kg (218 lb)   SpO2 98%   BMI 41.19 kg/m     Physical Exam  Vitals and nursing note reviewed.   Constitutional:       Appearance: Normal appearance.   HENT:      Head: Normocephalic and atraumatic.   Eyes:      Conjunctiva/sclera:  Conjunctivae normal.   Cardiovascular:      Heart sounds: Normal heart sounds.   Pulmonary:      Breath sounds: Normal breath sounds.   Musculoskeletal:      Cervical back: Normal range of motion and neck supple.   Neurological:      General: No focal deficit present.      Mental Status: She is alert.       Physical Exam       Results  LABS  A1c: 9 (August 2025)    RADIOLOGY  Thyroid  ultrasound: Multiple nodules noted (10/08/2023)  Parotid gland ultrasound: Solid-appearing nodule along the surface of the parotid gland on the right, likely a lymph node (10/08/2023)      Assessment/Plan   An 89 year old female with a history of hypertension, hyperlipidemia, type 2 diabetes, atrial fibrillation, and diabetic nephropathy presents with her daughter to establish care. Her back pain, likely sciatic in nature, worsens with prolonged sitting and led to an ER visit last week; she uses Tylenol  Extra Strength for relief. Her blood sugars range from 200-300 despite insulin  use (20 units AM, 10 units PM), and she consumes sugary snacks and soda. She has stage 3 CKD, has not seen a nephrologist in over a year, and previously  stopped Lasix; her hypertension is currently managed with clonidine , with prior adjustments to losartan and diltiazem   Assessment & Plan  1. Type 2 diabetes mellitus   - Uncontrolled, with blood glucose levels ranging from 200-300 mg/dL and J8r of 0.9%.  - Patient is on insulin  therapy (20 units in the morning and 10 units at nigh) with a history of acidosis due to missed doses.  - Will order a continuous glucose monitor (CGM), as the patient requires frequent blood glucose checks to better manage diabetes.  - Recommended eliminating sugary snacks and drinks to help improve glycemic control.  - Adjust insulin  based on blood sugar values next visit  - Referral to Primary Care  - Referral to Endocrinology ([place]); Future    2. Hypertension, unspecified type  - Losartan 100 mg, Clonidine  0.2 mg TID and diltiazem   120 mg daily      3. Stage 3b chronic kidney disease (CMS/HCC)  - Ambulatory referral to Nephrology; Future  - Follow Up In Primary Care; Future    4. Atrial fibrillation, unspecified type (CMS/HCC)  - Managed with diltiazem  120 mg daily, amiodarone 200 mg (1/2 tablet), and Xarelto .   Recent medication adjustments for heart rate and rhythm.    5. Body mass index (BMI) 40.0-44.9, adult (CMS/HCC)  - Recommended eliminating sugary snacks and drinks   - Reduce carbohydrate intake and increase vegetable intake      6. Chronic low back pain with sciatica  - Managed with Tylenol  use as needed.  - Discuss physical therapy benefits.      Verbal consent obtained to record this visit.       RTC for 1-2 months    Honor Barry, MD

## 2023-10-22 ENCOUNTER — Ambulatory Visit (INDEPENDENT_AMBULATORY_CARE_PROVIDER_SITE_OTHER): Admitting: Orthopaedic Surgery

## 2023-10-23 ENCOUNTER — Other Ambulatory Visit (INDEPENDENT_AMBULATORY_CARE_PROVIDER_SITE_OTHER): Payer: Self-pay | Admitting: Internal Medicine

## 2023-11-19 ENCOUNTER — Other Ambulatory Visit (INDEPENDENT_AMBULATORY_CARE_PROVIDER_SITE_OTHER): Payer: Self-pay | Admitting: Internal Medicine

## 2023-11-19 DIAGNOSIS — E119 Type 2 diabetes mellitus without complications: Secondary | ICD-10-CM

## 2023-11-24 ENCOUNTER — Ambulatory Visit (INDEPENDENT_AMBULATORY_CARE_PROVIDER_SITE_OTHER): Admitting: Internal Medicine

## 2023-12-03 ENCOUNTER — Encounter (INDEPENDENT_AMBULATORY_CARE_PROVIDER_SITE_OTHER): Payer: Self-pay | Admitting: Internal Medicine

## 2023-12-03 DIAGNOSIS — E119 Type 2 diabetes mellitus without complications: Secondary | ICD-10-CM

## 2024-01-12 ENCOUNTER — Emergency Department

## 2024-01-12 ENCOUNTER — Emergency Department: Admission: EM | Admit: 2024-01-12 | Discharge: 2024-01-13 | Disposition: A | Attending: Student | Admitting: Student

## 2024-01-12 DIAGNOSIS — R600 Localized edema: Secondary | ICD-10-CM | POA: Insufficient documentation

## 2024-01-12 DIAGNOSIS — I10 Essential (primary) hypertension: Secondary | ICD-10-CM | POA: Insufficient documentation

## 2024-01-12 LAB — COMPREHENSIVE METABOLIC PANEL
ALT: 13 U/L (ref <=55)
AST (SGOT): 22 U/L (ref <=41)
Albumin/Globulin Ratio: 0.7 — ABNORMAL LOW (ref 0.9–2.2)
Albumin: 3 g/dL — ABNORMAL LOW (ref 3.5–4.9)
Alkaline Phosphatase: 87 U/L (ref 37–117)
Anion Gap: 11 (ref 5.0–15.0)
BUN: 26 mg/dL — ABNORMAL HIGH (ref 7–21)
Bilirubin, Total: 0.2 mg/dL (ref 0.2–1.2)
CO2: 27 meq/L (ref 17–29)
Calcium: 9.2 mg/dL (ref 7.9–10.2)
Chloride: 101 meq/L (ref 99–111)
Creatinine: 1.3 mg/dL — ABNORMAL HIGH (ref 0.4–1.0)
GFR: 38.8 mL/min/1.73 m2 — ABNORMAL LOW (ref >=60.0)
Globulin: 4.5 g/dL — ABNORMAL HIGH (ref 2.0–3.6)
Glucose: 230 mg/dL — ABNORMAL HIGH (ref 70–100)
Potassium: 5 meq/L (ref 3.5–5.3)
Protein, Total: 7.5 g/dL (ref 6.0–8.3)
Sodium: 139 meq/L (ref 135–145)

## 2024-01-12 LAB — LAB USE ONLY - CBC WITH DIFFERENTIAL
Absolute Basophils: 0.09 x10 3/uL — ABNORMAL HIGH (ref 0.00–0.08)
Absolute Eosinophils: 0.51 x10 3/uL — ABNORMAL HIGH (ref 0.00–0.44)
Absolute Immature Granulocytes: 0.02 x10 3/uL (ref 0.00–0.07)
Absolute Lymphocytes: 5.21 x10 3/uL — ABNORMAL HIGH (ref 0.42–3.22)
Absolute Monocytes: 1.45 x10 3/uL — ABNORMAL HIGH (ref 0.21–0.85)
Absolute Neutrophils: 4.07 x10 3/uL (ref 1.10–6.33)
Absolute nRBC: 0 x10 3/uL (ref <=0.00)
Basophils %: 0.8 %
Eosinophils %: 4.5 %
Hematocrit: 36.3 % (ref 34.7–43.7)
Hemoglobin: 11.4 g/dL (ref 11.4–14.8)
Immature Granulocytes %: 0.2 %
Lymphocytes %: 45.9 %
MCH: 25.7 pg (ref 25.1–33.5)
MCHC: 31.4 g/dL — ABNORMAL LOW (ref 31.5–35.8)
MCV: 81.8 fL (ref 78.0–96.0)
MPV: 9.8 fL (ref 8.9–12.5)
Monocytes %: 12.8 %
Neutrophils %: 35.8 %
Platelet Count: 412 x10 3/uL — ABNORMAL HIGH (ref 142–346)
Preliminary Absolute Neutrophil Count: 4.07 x10 3/uL (ref 1.10–6.33)
RBC: 4.44 x10 6/uL (ref 3.90–5.10)
RDW: 18 % — ABNORMAL HIGH (ref 11–15)
WBC: 11.35 x10 3/uL — ABNORMAL HIGH (ref 3.10–9.50)
nRBC %: 0 /100{WBCs} (ref <=0.0)

## 2024-01-12 LAB — NT-PROBNP: NT-ProBNP: 871 pg/mL (ref <=956.1)

## 2024-01-12 LAB — MAGNESIUM: Magnesium: 2.2 mg/dL (ref 1.6–2.6)

## 2024-01-12 LAB — HIGH SENSITIVITY TROPONIN-I: hs Troponin: 16 ng/L — ABNORMAL HIGH (ref <=14.0)

## 2024-01-12 NOTE — ED Provider Notes (Signed)
 [facility]  Emergency Department Physician Note      Diagnosis/Disposition     ED Disposition:  Discharge from ED Observation    ED Diagnosis:     Edema of left lower extremity  Hypertension, unspecified type    Discharge Medication List as of 01/13/2024  1:15 AM          History of Present Illness      Chief Complaint: Leg Swelling     89 y.o. female with past medical history as below presenting to the ED today with left lower extremity edema.  Patient's daughter is at bedside stating that she noticed that both of patient's legs were tense and swollen.  States that she has been compliant keeping her legs up at home.  She does have a history of DVT and is on Xarelto .  She denies have any chest pain, shortness of breath, nausea, vomiting, injury.  Denies numbness or tingling in her extremities.  Patient with no complaints at this time.      Independent Historian (other than patient): Caregiver, daughter  Additional History Provided by Independent Historian: Incorporated above    Physical Exam     ED Triage Vitals   Encounter Vitals Group      BP 01/12/24 1852 157/73      Girls Systolic BP Percentile --       Girls Diastolic BP Percentile --       Boys Systolic BP Percentile --       Boys Diastolic BP Percentile --       Heart Rate 01/12/24 1852 63      Resp Rate 01/12/24 1852 18      Temp 01/12/24 1852 98.1 F (36.7 C)      Temp src 01/12/24 1852 Oral      SpO2 01/12/24 1852 98 %      Weight 01/12/24 1857 105.8 kg      Height 01/12/24 1857 1.549 m      Head Circumference --       Peak Flow --       Pain Score 01/12/24 1857 5      Pain Loc --       Pain Education --       Exclude from Growth Chart --       Physical Exam  Vitals and nursing note reviewed.   Constitutional:       General: She is not in acute distress.     Appearance: She is not ill-appearing or toxic-appearing.   HENT:      Head: Normocephalic and atraumatic.      Nose: Nose normal.      Mouth/Throat:      Mouth: Mucous membranes are  moist.   Eyes:      Conjunctiva/sclera: Conjunctivae normal.      Pupils: Pupils are equal, round, and reactive to light.   Cardiovascular:      Rate and Rhythm: Normal rate and regular rhythm.      Pulses: Normal pulses.      Heart sounds: Normal heart sounds.   Pulmonary:      Effort: Pulmonary effort is normal.   Abdominal:      General: Abdomen is flat.   Musculoskeletal:         General: Normal range of motion.      Cervical back: Normal range of motion.      Right lower leg: Edema (2+ pitting edema) present.      Left  lower leg: Edema (3+ pitting edema) present.   Skin:     General: Skin is warm and dry.      Capillary Refill: Capillary refill takes less than 2 seconds.   Neurological:      General: No focal deficit present.      Mental Status: She is alert and oriented to person, place, and time. Mental status is at baseline.                Medical Decision Making        PRIMARY PROBLEM LIST      1. Chronic Illness with Exacerbation/Progression DIAGNOSIS: See aboveChronic Illness Impacting Care of the above problem: Hypertension, Atrial Fibrillation/Chronic Arrhythmia, and Advanced age Increases complexity of evaluation and Increases the risk of severe disease        DISCUSSION      89 year old woman presenting to ED today with bilateral lower extremity edema, greater on the left.  Patient's daughter noticed it earlier today.  She denies having any chest pain, shortness of breath.  She is on Xarelto , sometimes she is noncompliant with her medications.  Differential diagnosis includes limited to lymphedema, dependent edema, DVT, failed outpatient therapy.  Ultrasound reviewed and interpreted, no evidence of a DVT.  CHF workup, largely unremarkable with no evidence of pulmonary edema on independently interpreted chest x-ray.  BNP within normal limits, mild elevation in troponin, negative delta.  Discussed findings with the patient's daughter, recommended close follow-up with primary care doctor.  Home  antihypertensive medications given.  Patient stable for discharge.    The patient was deemed stable for discharge. They were given strict return precautions as it relates to their presumed diagnosis, verbalized understanding of these precautions and agreed to follow up as instructed. All questions were answered prior to discharge.    Additional Notes                                   ED Course as of 01/19/24 2347   Tue Jan 13, 2024   0053 hs Troponin-I(!): 15.2 [LD]      ED Course User Index  [LD] Alm Jerrol RAMAN, MD           Vital Signs: Reviewed the patients vital signs.   Nursing Notes: Reviewed and utilized available nursing notes.   Medical Records Reviewed: Reviewed available past medical records.   Counseling: The emergency provider has spoken with the patient and discussed todays findings, in addition to providing specific details for the plan of care. Questions are answered and there is agreement with the plan.     CRITICAL CARE/PROCEDURES    Procedures          CARDIAC STUDIES      The following cardiac studies were independently interpreted by me the Emergency Medicine Provider. For full cardiac study results please see chart.                                                                   EMERGENCY IMAGING STUDIES      The following imaging studies were independently interpreted by me (emergency medicine provider):  Supplemental Encounter Data   Medical History[7]  Past Surgical History[8]  Social History[9]  Family History[10]  Allergies[11]    Medications Administered:  Medications   cloNIDine  (CATAPRES ) tablet 0.1 mg (0.1 mg Oral Given 01/13/24 0109)   rivaroxaban  (XARELTO ) tablet 15 mg (15 mg Oral Given 01/13/24 0111)     Laboratory and Imaging Studies:       US  Venous Dopp Low Extrem Comp Bilat   Final Result          1.No evidence of deep venous thrombosis in the legs BILATERALLY.   2. There is no apparent abnormality.      Austin Door, MD   01/13/2024  12:06 AM      XR Chest  AP Portable   Final Result      1.Mild right basilar atelectasis.   2.Enlarged cardiac silhouette.      Levada DASEN. Tommy, MD   01/12/2024 10:21 PM                     [7]   Past Medical History:  Diagnosis Date    Angioedema     Atrial fibrillation (CMS/HCC)     Diabetes mellitus (CMS/HCC)     Hypertension    [8]   Past Surgical History:  Procedure Laterality Date    HERNIA REPAIR      HYSTERECTOMY     [9]   Social History  Tobacco Use    Smoking status: Never    Smokeless tobacco: Never   Vaping Use    Vaping status: Never Used   Substance Use Topics    Alcohol use: No    Drug use: No   [10] No family history on file.  [11]   Allergies  Allergen Reactions    Ace Inhibitors     Dye [Iodinated Contrast Media]     Penicillins     Prednisolone     Sulfa Antibiotics         Alm Jerrol RAMAN, MD  01/19/24 2350

## 2024-01-13 LAB — HIGH SENSITIVITY TROPONIN-I WITH DELTA
hs Troponin-I Delta: 1
hs Troponin: 15.2 ng/L — ABNORMAL HIGH (ref <=14.0)

## 2024-01-13 LAB — HEMOGLOBIN A1C
Average Estimated Glucose: 260.4 mg/dL
Hemoglobin A1C: 10.7 % — ABNORMAL HIGH (ref 4.6–5.6)

## 2024-01-13 MED ORDER — FUROSEMIDE 20 MG PO TABS: 20.0000 mg | ORAL_TABLET | Freq: Every day | ORAL | 0 refills | Status: DC

## 2024-01-13 MED ORDER — RIVAROXABAN 15 MG PO TABS
15.0000 mg | ORAL_TABLET | Freq: Once | ORAL | Status: AC
  Administered 2024-01-13: 15 mg via ORAL
  Filled 2024-01-13: qty 1

## 2024-01-13 MED ORDER — CLONIDINE HCL 0.1 MG PO TABS
0.1000 mg | ORAL_TABLET | Freq: Once | ORAL | Status: AC
  Administered 2024-01-13: 0.1 mg via ORAL
  Filled 2024-01-13: qty 1

## 2024-01-13 NOTE — Discharge Instructions (Addendum)
 There is no evidence of a blood clot in the legs.  Keep legs elevated.  Use compression stockings.  Take Lasix  as prescribed.  Follow-up with your primary care doctor.  Return to the emergency room for worsening symptoms

## 2024-02-24 ENCOUNTER — Encounter (INDEPENDENT_AMBULATORY_CARE_PROVIDER_SITE_OTHER): Payer: Self-pay

## 2024-02-24 ENCOUNTER — Ambulatory Visit (INDEPENDENT_AMBULATORY_CARE_PROVIDER_SITE_OTHER): Admitting: Internal Medicine

## 2024-02-24 VITALS — BP 156/79 | HR 81 | Temp 98.3°F | Wt 212.4 lb

## 2024-02-24 DIAGNOSIS — E119 Type 2 diabetes mellitus without complications: Secondary | ICD-10-CM

## 2024-02-24 DIAGNOSIS — I4891 Unspecified atrial fibrillation: Secondary | ICD-10-CM

## 2024-02-24 LAB — POCT HEMOGLOBIN A1C: POCT Hgb A1C: 9.8 % — AB (ref 4.0–5.9)

## 2024-02-24 MED ORDER — AMIODARONE HCL 200 MG PO TABS: 200.0000 mg | ORAL_TABLET | Freq: Every day | ORAL | 1 refills | Status: AC

## 2024-02-24 MED ORDER — FUROSEMIDE 20 MG PO TABS: 20.0000 mg | ORAL_TABLET | ORAL | Status: AC

## 2024-02-24 MED ORDER — PANTOPRAZOLE SODIUM 20 MG PO TBEC: 20.0000 mg | DELAYED_RELEASE_TABLET | Freq: Every day | ORAL | 1 refills | Status: AC

## 2024-02-24 NOTE — Progress Notes (Unsigned)
 [name]     {  Disappearing Text  Click a link below to be taken to that activity or part of the chart   Chart Review  Order Review  Review Flowsheets  Labs  Health Maintenance  Immunizations  Allergies  Medications  Problem List  History  Synopsis   :[id number]}      Subjective   No chief complaint on file.  March Androw sp  Psntome, amidora refills  History of Present Illness  Emily Stevens is a 89 year old female with diabetes and atrial fibrillation who presents for diabetes management and medication review.    She has had difficulty controlling her diabetes with persistently elevated blood sugars. Two months ago she went to the emergency room for severe hyperglycemia after drinking juice and eating fruit. She uses insulin  20 units in the morning and 10 units at night. She has trouble checking and recording blood sugars because her devices are malfunctioning.    She has intermittent leg swelling, previously treated with furosemide . She takes spironolactone 25 mg daily and uses furosemide  as needed at home. Her caregiver monitors for ankle edema.    She has atrial fibrillation and takes amiodarone , Xarelto  nightly, Cardizem , and clonidine  twice daily. She was followed by cardiology and had a stress test two years ago but has not maintained regular follow-up.    She has stage 3 chronic kidney disease, and her caregiver reports she is being monitored for progression.    She recently had gum problems from a dental infection and completed a course of azithromycin.  Review of Systems    Objective   There were no vitals taken for this visit.  Physical Exam  Physical Exam  EXTREMITIES: Swelling below the ankle.     Results  Labs  HbA1c (02/24/2024): 9.8 decreased from 10.7 on 12/2023      Assessment/Plan     Assessment & Plan  Type 2 diabetes mellitus  Blood sugar levels elevated at 9.8, improved from 10.7. Non-compliance with monitoring and missed insulin  doses noted.  - Encouraged blood  sugar monitoring twice daily.  - Discuss potential increase in nighttime insulin  to 14-15 units if needed.  - Ensure endocrinologist follow-up in April.  - Follow up in 3 months for wellness visit.    Atrial fibrillation  On amiodarone , Xarelto , and Cardizem . No recent cardiology follow-up.  - Provided cardiologist referral for ongoing management.  - Continue amiodarone , Xarelto , and Cardizem .    Stage 3b chronic kidney disease  Kidney function well-managed. Concerns about long-term furosemide  and spironolactone use.  - Monitor kidney function regularly.  - Use furosemide  three times a week for leg swelling.    Hypertension  Blood pressure elevated but stable. Clonidine  dose reduced.  - Continue current antihypertensive regimen including clonidine  twice daily.    General health maintenance  Flu vaccine given. Eye exam scheduled.  - Ensure follow-up with eye doctor in March.    Verbal consent obtained to record this visit.

## 2024-05-07 ENCOUNTER — Ambulatory Visit (INDEPENDENT_AMBULATORY_CARE_PROVIDER_SITE_OTHER): Admitting: Endocrinology, Diabetes and Metabolism

## 2024-05-28 ENCOUNTER — Ambulatory Visit (INDEPENDENT_AMBULATORY_CARE_PROVIDER_SITE_OTHER): Admitting: Internal Medicine
